# Patient Record
Sex: Female | Born: 1955
Health system: Southern US, Community
[De-identification: ages and names within clinical notes are randomized; demographics above are authoritative.]

## PROBLEM LIST (undated history)

## (undated) DIAGNOSIS — J45909 Unspecified asthma, uncomplicated: Secondary | ICD-10-CM

## (undated) DIAGNOSIS — K219 Gastro-esophageal reflux disease without esophagitis: Secondary | ICD-10-CM

## (undated) DIAGNOSIS — I951 Orthostatic hypotension: Secondary | ICD-10-CM

## (undated) DIAGNOSIS — R059 Cough, unspecified: Secondary | ICD-10-CM

## (undated) DIAGNOSIS — Z9882 Breast implant status: Secondary | ICD-10-CM

## (undated) DIAGNOSIS — J329 Chronic sinusitis, unspecified: Secondary | ICD-10-CM

## (undated) DIAGNOSIS — Z8659 Personal history of other mental and behavioral disorders: Secondary | ICD-10-CM

## (undated) DIAGNOSIS — R05 Cough: Secondary | ICD-10-CM

## (undated) DIAGNOSIS — G5600 Carpal tunnel syndrome, unspecified upper limb: Secondary | ICD-10-CM

## (undated) DIAGNOSIS — G8929 Other chronic pain: Secondary | ICD-10-CM

## (undated) DIAGNOSIS — J189 Pneumonia, unspecified organism: Secondary | ICD-10-CM

## (undated) DIAGNOSIS — C50911 Malignant neoplasm of unspecified site of right female breast: Secondary | ICD-10-CM

## (undated) DIAGNOSIS — C3490 Malignant neoplasm of unspecified part of unspecified bronchus or lung: Secondary | ICD-10-CM

## (undated) DIAGNOSIS — G4730 Sleep apnea, unspecified: Secondary | ICD-10-CM

## (undated) DIAGNOSIS — R0600 Dyspnea, unspecified: Secondary | ICD-10-CM

## (undated) DIAGNOSIS — T8859XA Other complications of anesthesia, initial encounter: Secondary | ICD-10-CM

## (undated) DIAGNOSIS — C50919 Malignant neoplasm of unspecified site of unspecified female breast: Secondary | ICD-10-CM

## (undated) DIAGNOSIS — I10 Essential (primary) hypertension: Secondary | ICD-10-CM

## (undated) DIAGNOSIS — M1990 Unspecified osteoarthritis, unspecified site: Secondary | ICD-10-CM

## (undated) DIAGNOSIS — F329 Major depressive disorder, single episode, unspecified: Secondary | ICD-10-CM

## (undated) DIAGNOSIS — M79672 Pain in left foot: Secondary | ICD-10-CM

## (undated) DIAGNOSIS — E785 Hyperlipidemia, unspecified: Secondary | ICD-10-CM

## (undated) DIAGNOSIS — R197 Diarrhea, unspecified: Secondary | ICD-10-CM

## (undated) DIAGNOSIS — Z720 Tobacco use: Secondary | ICD-10-CM

## (undated) DIAGNOSIS — F1910 Other psychoactive substance abuse, uncomplicated: Secondary | ICD-10-CM

## (undated) DIAGNOSIS — F32A Depression, unspecified: Secondary | ICD-10-CM

## (undated) DIAGNOSIS — D649 Anemia, unspecified: Secondary | ICD-10-CM

## (undated) DIAGNOSIS — H548 Legal blindness, as defined in USA: Secondary | ICD-10-CM

## (undated) DIAGNOSIS — Z862 Personal history of diseases of the blood and blood-forming organs and certain disorders involving the immune mechanism: Secondary | ICD-10-CM

## (undated) HISTORY — DX: Malignant neoplasm of unspecified site of unspecified female breast: C50.919

## (undated) HISTORY — PX: VAGINAL HYSTERECTOMY: SUR661

## (undated) HISTORY — DX: Breast implant status: Z98.82

## (undated) HISTORY — DX: Other chronic pain: G89.29

## (undated) HISTORY — DX: Tobacco use: Z72.0

## (undated) HISTORY — PX: BREAST LUMPECTOMY: SHX2

## (undated) HISTORY — DX: Chronic sinusitis, unspecified: J32.9

## (undated) HISTORY — DX: Pain in left foot: M79.672

## (undated) HISTORY — PX: PLANTAR FASCIA RELEASE: SHX2239

## (undated) HISTORY — DX: Essential (primary) hypertension: I10

## (undated) HISTORY — DX: Other psychoactive substance abuse, uncomplicated: F19.10

## (undated) HISTORY — DX: Malignant neoplasm of unspecified site of right female breast: C50.911

## (undated) HISTORY — DX: Sleep apnea, unspecified: G47.30

## (undated) HISTORY — DX: Diarrhea, unspecified: R19.7

## (undated) HISTORY — DX: Hyperlipidemia, unspecified: E78.5

## (undated) HISTORY — DX: Gastro-esophageal reflux disease without esophagitis: K21.9

## (undated) HISTORY — DX: Carpal tunnel syndrome, unspecified upper limb: G56.00

## (undated) HISTORY — PX: TONSILLECTOMY: SUR1361

## (undated) HISTORY — DX: Personal history of other mental and behavioral disorders: Z86.59

## (undated) HISTORY — DX: Personal history of diseases of the blood and blood-forming organs and certain disorders involving the immune mechanism: Z86.2

---
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
## 1988-01-24 HISTORY — PX: BREAST LUMPECTOMY: SHX2

## 1988-01-24 HISTORY — PX: VAGINAL HYSTERECTOMY: SUR661

## 1991-01-24 HISTORY — PX: PLACEMENT OF BREAST IMPLANTS: SHX6334

## 1998-02-04 ENCOUNTER — Other Ambulatory Visit: Admission: RE | Admit: 1998-02-04 | Discharge: 1998-02-04 | Payer: Self-pay | Admitting: Gynecology

## 2007-07-12 ENCOUNTER — Emergency Department (HOSPITAL_BASED_OUTPATIENT_CLINIC_OR_DEPARTMENT_OTHER): Admission: EM | Admit: 2007-07-12 | Discharge: 2007-07-12 | Payer: Self-pay | Admitting: Emergency Medicine

## 2007-07-12 ENCOUNTER — Ambulatory Visit (HOSPITAL_COMMUNITY): Admission: RE | Admit: 2007-07-12 | Discharge: 2007-07-12 | Payer: Self-pay | Admitting: Emergency Medicine

## 2008-04-29 DIAGNOSIS — E785 Hyperlipidemia, unspecified: Secondary | ICD-10-CM

## 2008-04-29 DIAGNOSIS — I10 Essential (primary) hypertension: Secondary | ICD-10-CM | POA: Insufficient documentation

## 2008-04-29 DIAGNOSIS — K219 Gastro-esophageal reflux disease without esophagitis: Secondary | ICD-10-CM | POA: Insufficient documentation

## 2010-10-20 LAB — POCT TOXICOLOGY PANEL
Barbiturates: NEGATIVE
Cocaine: NEGATIVE
Opiates: NEGATIVE

## 2010-10-20 LAB — POCT I-STAT, CHEM 8
Creatinine, Ser: 0.8
Glucose, Bld: 105 — ABNORMAL HIGH
Hemoglobin: 12.9
Potassium: 3.8
TCO2: 24

## 2010-10-20 LAB — DIFFERENTIAL
Basophils Relative: 3 — ABNORMAL HIGH
Eosinophils Absolute: 0.1
Neutrophils Relative %: 53

## 2010-10-20 LAB — PROTIME-INR
INR: 1
Prothrombin Time: 13.4

## 2010-10-20 LAB — URINALYSIS, ROUTINE W REFLEX MICROSCOPIC
Bilirubin Urine: NEGATIVE
Ketones, ur: NEGATIVE
Nitrite: NEGATIVE
Urobilinogen, UA: 0.2

## 2010-10-20 LAB — CBC
MCHC: 35.7
MCV: 98.9
Platelets: 200
WBC: 6

## 2012-01-24 HISTORY — PX: BREAST LUMPECTOMY: SHX2

## 2012-04-08 ENCOUNTER — Other Ambulatory Visit: Payer: Self-pay | Admitting: Neurology

## 2012-04-08 DIAGNOSIS — R209 Unspecified disturbances of skin sensation: Secondary | ICD-10-CM

## 2012-04-11 ENCOUNTER — Ambulatory Visit (INDEPENDENT_AMBULATORY_CARE_PROVIDER_SITE_OTHER): Payer: BC Managed Care – PPO

## 2012-04-11 ENCOUNTER — Other Ambulatory Visit: Payer: Self-pay

## 2012-04-11 DIAGNOSIS — R209 Unspecified disturbances of skin sensation: Secondary | ICD-10-CM

## 2012-04-12 NOTE — Procedures (Signed)
[facility]  NEUROIMAGING REPORT   STUDY DATE: 04/11/12 PATIENT NAME: Gabrielle [name] DOB: November 27, 1955 MRN: [id number]  ORDERING CLINICIAN: Levert Feinstein, MD  CLINICAL HISTORY: 57 year old female with numbness.  EXAM: MRI cervical spine (without)  TECHNIQUE: MRI of the cervical spine was obtained utilizing 3 mm sagittal slices from the posterior fossa down to the T3-4 level with T1, T2 and inversion recovery views. In addition 4 mm axial slices from C2-3 down to T1-2 level were included with T2 and gradient echo views. CONTRAST: no IMAGING SITE: [facility] [address]   FINDINGS:  On sagittal views the vertebral bodies have normal height and alignment.  Disc bulging and spondylosis from C3-4 down to C6-7. Degenerative endplate disease at C6-7. The spinal cord is normal in size and appearance. The posterior fossa, pituitary gland and paraspinal soft tissues are unremarkable.    On axial views: C2-3, C3-4, C4-5: no spinal stenosis or foraminal narrowing  C5-6: disc bulging with mild bilateral foraminal stenosis C6-7: disc bulging with mild right foraminal stenosis C7-T1: no spinal stenosis or foraminal narrowing   Limited views of the soft tissues of the head and neck are unremarkable.  IMPRESSION:  Abnormal MRI cervical spine (without) demonstrating: 1. At C5-6: disc bulging with mild bilateral foraminal stenosis 2. At C6-7: disc bulging with mild right foraminal stenosis   INTERPRETING PHYSICIAN:  Suanne Marker, MD Certified in Neurology, Neurophysiology and Neuroimaging  [facility] [address] [place], [place] [address] [phone]

## 2012-04-15 ENCOUNTER — Telehealth: Payer: Self-pay | Admitting: Neurology

## 2012-04-15 NOTE — Telephone Encounter (Signed)
I have called her,  Mild Abnormal MRI cervical spine (without) demonstrating:  1. At C5-6: disc bulging with mild bilateral foraminal stenosis  2. At C6-7: disc bulging with mild right foraminal stenosis  Mild degenerative disc disease, no change in treatment plan

## 2012-04-23 HISTORY — PX: BREAST LUMPECTOMY WITH SENTINEL LYMPH NODE BIOPSY: SHX5597

## 2012-05-14 ENCOUNTER — Encounter (INDEPENDENT_AMBULATORY_CARE_PROVIDER_SITE_OTHER): Payer: Self-pay | Admitting: Internal Medicine

## 2012-05-14 DIAGNOSIS — C50919 Malignant neoplasm of unspecified site of unspecified female breast: Secondary | ICD-10-CM

## 2012-06-05 ENCOUNTER — Encounter: Payer: BC Managed Care – PPO | Admitting: Internal Medicine

## 2012-06-11 ENCOUNTER — Encounter: Payer: BC Managed Care – PPO | Admitting: Internal Medicine

## 2012-07-22 ENCOUNTER — Encounter (HOSPITAL_COMMUNITY): Payer: BC Managed Care – PPO | Attending: Oncology | Admitting: Oncology

## 2012-07-22 ENCOUNTER — Encounter (HOSPITAL_COMMUNITY): Payer: Self-pay | Admitting: Oncology

## 2012-07-22 VITALS — BP 144/79 | HR 69 | Temp 97.8°F | Resp 16 | Ht 60.0 in | Wt 157.0 lb

## 2012-07-22 DIAGNOSIS — C50919 Malignant neoplasm of unspecified site of unspecified female breast: Secondary | ICD-10-CM | POA: Insufficient documentation

## 2012-07-22 DIAGNOSIS — C50911 Malignant neoplasm of unspecified site of right female breast: Secondary | ICD-10-CM

## 2012-07-22 NOTE — Progress Notes (Signed)
Gabrielle [name] presented for labwork. Labs per MD order drawn via Peripheral Line 23 gauge needle inserted in left lateral upper forearm  Good blood return present. Procedure without incident.  Needle removed intact. Patient tolerated procedure well.

## 2012-07-22 NOTE — Progress Notes (Signed)
#  1 stage I, grade 1, ER positive greater than 90%, PR positive greater than 90%, KI-67 marker minimally raised, no LVI, clear margins, 3 lymph nodes, sentinel, all negative. Her Oncotype DX score was very low at 6. She therefore was recommended by her oncologist in [place] to avoid chemotherapy and to take hormonal therapy after radiation therapy. She is in the middle of radiation therapy by Dr.Palermo.  She will be a candidate hopefully for any of the medications once we verify her menopausal status. She started having hot flashes 2 years ago, possibly 2-1/2 years ago, which are mild but present. She had a vaginal hysterectomy for atypia many years ago. Both ovaries remain.  She is status post bilateral breast implants in the past.  I think an FSH and LH level are in order to verify menopausal status in this young 57 year old woman.  There is no family history of breast cancer. She was not on hormones to my knowledge. She has several siblings none of whom have cancer.  She smokes a pack a day and has done so for 40 years and has had a CAT scan this year in [place] which did not reveal cancer. This needs to be repeated over the next 2 years.  Oncology review of systems otherwise is negative. She does have a history of possible bipolar disorder. She has a remote history of drug use in the past. She has other issues including a history of GERD, hyperlipidemia, Esotropia status post 3 surgeries as a youth but with some persistence. She does not see double. She has also had a plantar fasciectomy and tonsillectomy.  BP 144/79  Pulse 69  Temp(Src) 97.8 F (36.6 C) (Oral)  Resp 16  Ht 5' (1.524 m)  Wt 157 lb (71.215 kg)  BMI 30.66 kg/m2  She is in no acute distress. She is alert and oriented. The skin exam is negative except for the right breast which is erythematous. The right breast is slightly swollen and tender. The left breast is negative for masses. Right breast is negative drop is masses and her  scar is well-healed. She has scars on the left breast which are well healed from prior benign findings. She has no lymphadenopathy in any location including cervical, supraclavicular, infraclavicular, axillary or inguinal areas. She has no arm edema or leg edema. Pulses are 1+ and symmetrical. She is right-handed. Lungs show diminished breath sounds as well as wheezes and rhonchi bilaterally. Her heart exam shows a regular rhythm and rate without murmur rub or gallop. Facial symmetry is intact except for the right eye which has medial deviation minimally.  This lady has a grade 1, stage I cancer with with a very low OncotypeDX score. She should do very well with 5 years of adjuvant hormonal therapy. If she is able to take an aromatase inhibitor and if she tolerates it well she may be a candidate for 5 for the years with tamoxifen if she is so inclined. More data will be out as she is treated over the next 5 years. She has more concern in my opinion with her smoking history and with this cancer. I encouraged her to quit smoking no matter what it takes. She is not sure she can achieve this. She will need to followup CAT scans in 2015 and 2016.

## 2012-07-22 NOTE — Patient Instructions (Addendum)
[facility] Discharge Instructions  RECOMMENDATIONS MADE BY THE CONSULTANT AND ANY TEST RESULTS WILL BE SENT TO YOUR REFERRING PHYSICIAN.  EXAM FINDINGS BY THE PHYSICIAN TODAY AND SIGNS OR SYMPTOMS TO REPORT TO CLINIC OR PRIMARY PHYSICIAN: Exam and discussion by Dr. Mariel Sleet.  You are at low risk for recurrence of breast cancer. We will check a luteinizing hormone level and a follicle stimulating hormone level. You are at a higher risk of developing lung cancer because of your smoking history.  Need to have Low contrasted CT of chest yearly for 2 more years.  MEDICATIONS PRESCRIBED:  None - Will start tamoxifen after you complete radiation  INSTRUCTIONS GIVEN AND DISCUSSED: Report any new lumps, bone pain, shortness of breath or other symptoms.  SPECIAL INSTRUCTIONS/FOLLOW-UP: Follow-up in 1 month to see PA.  Thank you for choosing [facility] to provide your oncology and hematology care.  To afford each patient quality time with our providers, please arrive at least 15 minutes before your scheduled appointment time.  With your help, our goal is to use those 15 minutes to complete the necessary work-up to ensure our physicians have the information they need to help with your evaluation and healthcare recommendations.    Effective January 1st, 2014, we ask that you re-schedule your appointment with our physicians should you arrive 10 or more minutes late for your appointment.  We strive to give you quality time with our providers, and arriving late affects you and other patients whose appointments are after yours.    Again, thank you for choosing [facility].  Our hope is that these requests will decrease the amount of time that you wait before being seen by our physicians.       _____________________________________________________________  Should you have questions after your visit to [facility], please contact our office at [phone]  [phone] between the hours of 8:30 a.m. and 5:00 p.m.  Voicemails left after 4:30 p.m. will not be returned until the following business day.  For prescription refill requests, have your pharmacy contact our office with your prescription refill request.

## 2012-07-23 LAB — FOLLICLE STIMULATING HORMONE: FSH: 117 m[IU]/mL — ABNORMAL HIGH

## 2012-08-01 ENCOUNTER — Other Ambulatory Visit (HOSPITAL_COMMUNITY): Payer: Self-pay | Admitting: Oncology

## 2012-08-01 ENCOUNTER — Telehealth (HOSPITAL_COMMUNITY): Payer: Self-pay

## 2012-08-01 DIAGNOSIS — C50919 Malignant neoplasm of unspecified site of unspecified female breast: Secondary | ICD-10-CM

## 2012-08-01 DIAGNOSIS — Z720 Tobacco use: Secondary | ICD-10-CM

## 2012-08-01 NOTE — Telephone Encounter (Signed)
Discussed with patient need to have CT of chest repeated in 3 months.  Will have scan done here at [facility].  To discuss further on visit on 08/21/12.

## 2012-08-21 ENCOUNTER — Telehealth (HOSPITAL_COMMUNITY): Payer: Self-pay

## 2012-08-21 ENCOUNTER — Encounter (HOSPITAL_COMMUNITY): Payer: BC Managed Care – PPO | Attending: Oncology | Admitting: Oncology

## 2012-08-21 ENCOUNTER — Encounter (HOSPITAL_COMMUNITY): Payer: Self-pay | Admitting: Oncology

## 2012-08-21 VITALS — BP 154/82 | HR 75 | Temp 98.2°F | Resp 16 | Wt 157.3 lb

## 2012-08-21 DIAGNOSIS — M8580 Other specified disorders of bone density and structure, unspecified site: Secondary | ICD-10-CM

## 2012-08-21 DIAGNOSIS — Z87891 Personal history of nicotine dependence: Secondary | ICD-10-CM | POA: Insufficient documentation

## 2012-08-21 DIAGNOSIS — C50919 Malignant neoplasm of unspecified site of unspecified female breast: Secondary | ICD-10-CM

## 2012-08-21 DIAGNOSIS — F17200 Nicotine dependence, unspecified, uncomplicated: Secondary | ICD-10-CM

## 2012-08-21 DIAGNOSIS — Z978 Presence of other specified devices: Secondary | ICD-10-CM

## 2012-08-21 DIAGNOSIS — M949 Disorder of cartilage, unspecified: Secondary | ICD-10-CM

## 2012-08-21 DIAGNOSIS — Z720 Tobacco use: Secondary | ICD-10-CM

## 2012-08-21 DIAGNOSIS — Z9882 Breast implant status: Secondary | ICD-10-CM

## 2012-08-21 DIAGNOSIS — C50911 Malignant neoplasm of unspecified site of right female breast: Secondary | ICD-10-CM | POA: Insufficient documentation

## 2012-08-21 DIAGNOSIS — M899 Disorder of bone, unspecified: Secondary | ICD-10-CM

## 2012-08-21 HISTORY — DX: Malignant neoplasm of unspecified site of right female breast: C50.911

## 2012-08-21 HISTORY — DX: Breast implant status: Z98.82

## 2012-08-21 HISTORY — DX: Tobacco use: Z72.0

## 2012-08-21 NOTE — Progress Notes (Signed)
Gabrielle Conrad, MD [facility] [address] [place] [address]  Invasive ductal carcinoma of right breast  Tobacco abuse  S/P bilateral breast implants  CURRENT THERAPY: S/P completion of radiation, to start hormonal therapy depending on estradiol level.  INTERVAL HISTORY: Gabrielle [name] 57 y.o. female returns for  regular  visit for followup of stage I, grade 1, invasive ductal carcinoma, ER positive greater than 90%, PR positive greater than 90%, KI-67 marker minimally raised, no LVI, clear margins, 3 lymph nodes, sentinel, all negative. Her Oncotype DX score was very low at 6. She therefore was recommended by her oncologist in [place] to avoid chemotherapy and to take hormonal therapy after radiation therapy. She completed radiation on 08/15/2012.  I personally reviewed and went over laboratory results with the patient.  Labs were reviewed.  Her FSH shows that she is post-menopausal, but estradiol was not performed.  Therefore, estradiol lab was drawn today.  If this shows that she is post-menopausal, then I will provide her an Rx for an Aromatase Inhibitor.  She reports, "I want the least expensive one."  I used a piece of the exam room paper and really explained her diagnosis, her staging, treatment including radiation following excision of the tumor, the pathology, and future treatment options of her cancer with anti-endocrine therapy.  We discussed the risks, benefits, alternatives, and side effects of therapy.  She is agreeable to pursue therapy upon reporting of her estradiol level.   I personally reviewed and went over radiographic studies with the patient.  Her CT scan performed in June 2014 showed a small opacity which will require a short-term follow-up.  This is scheduled for September.  The patient reports that she has a long history of pneumonias and bronchitis'.  She will require 3 years worth of surveillance CT chest imaging due to her >30 pack year smoking  history.    She continues to smoke and we had a long discussion regarding this.  Smoking cessation education was provided.  She smoke 1 ppd.   She did have a bone density test performed at [facility] a few months ago that showed osteopenia.  She is on Calcium and Vitamin D as a result.  This will need to be repeated in 2 years.   Oncologically, she denies any complaints and ROS questioning is negative.    Past Medical History  Diagnosis Date  . Hyperlipidemia   . Carpal tunnel syndrome   . Chronic pain in left foot   . GERD (gastroesophageal reflux disease)   . History of anemia   . H/O eating disorder   . Bipolar depression   . Breast cancer 04/2012    right  . Invasive ductal carcinoma of right breast 08/21/2012  . Tobacco abuse 08/21/2012    > 40 pack years  . S/P bilateral breast implants 08/21/2012    Prior to cancer diagnosis    has HYPERLIPIDEMIA; HYPERTENSION; GERD; Invasive ductal carcinoma of right breast; Tobacco abuse; and S/P bilateral breast implants on her problem list.     has No Known Allergies.  Gabrielle [name] had no medications administered during this visit.  Past Surgical History  Procedure Laterality Date  . Breast lumpectomy Left 1990    [facility]  . Breast lumpectomy with sentinel lymph node biopsy Right 04/2012  . Abdominal hysterectomy      vaginal - still both ovaries  . Appendectomy      Denies any headaches, dizziness, double vision,  fevers, chills, night sweats, nausea, vomiting, diarrhea, constipation, chest pain, heart palpitations, shortness of breath, blood in stool, black tarry stool, urinary pain, urinary burning, urinary frequency, hematuria.   PHYSICAL EXAMINATION  ECOG PERFORMANCE STATUS: 0 - Asymptomatic  Filed Vitals:   08/21/12 1200  BP: 154/82  Pulse: 75  Temp: 98.2 F (36.8 C)  Resp: 16    GENERAL:alert, no distress, well nourished, well developed, comfortable, cooperative and smiling SKIN: skin color, texture,  turgor are normal, no rashes or significant lesions HEAD: Normocephalic, No masses, lesions, tenderness or abnormalities EYES: normal, PERRLA, EOMI, Conjunctiva are pink and non-injected EARS: External ears normal OROPHARYNX:mucous membranes are moist  NECK: supple, no adenopathy, thyroid normal size, non-tender, without nodularity, no stridor, non-tender, trachea midline LYMPH:  no palpable lymphadenopathy, no hepatosplenomegaly BREAST:bilateral implants, no palpable abnormalities otherwise.  Right breast is erythematous from radiation and mildly tender to palpation. LUNGS: clear to auscultation and percussion, decreased breath sounds HEART: regular rate & rhythm, no murmurs, no gallops, S1 normal and S2 normal ABDOMEN:abdomen soft, non-tender, normal bowel sounds, no masses or organomegaly and no hepatosplenomegaly BACK: Back symmetric, no curvature. EXTREMITIES:less then 2 second capillary refill, no joint deformities, effusion, or inflammation, no edema, no skin discoloration, no clubbing, no cyanosis  NEURO: alert & oriented x 3 with fluent speech, no focal motor/sensory deficits, gait normal   LABORATORY DATA: CBC    Component Value Date/Time   WBC 6.0 07/12/2007 1227   RBC 3.45* 07/12/2007 1227   HGB 12.9 07/12/2007 1313   HCT 38.0 07/12/2007 1313   PLT 200 07/12/2007 1227   MCV 98.9 07/12/2007 1227   MCHC 35.7 CORRECTED FOR LIPEMIA 07/12/2007 1227   RDW 12.6 07/12/2007 1227   LYMPHSABS 2.2 07/12/2007 1227   MONOABS 0.4 07/12/2007 1227   EOSABS 0.1 07/12/2007 1227   BASOSABS 0.2* 07/12/2007 1227      Chemistry      Component Value Date/Time   NA 128* 07/12/2007 1313   K 3.8 07/12/2007 1313   CL 99 07/12/2007 1313   BUN 9 07/12/2007 1313   CREATININE 0.8 07/12/2007 1313   No results found for this basename: CALCIUM, ALKPHOS, AST, ALT, BILITOT      Results for Gabrielle [name] (MRN [id number]) as of 08/21/2012 12:03  Ref. Range 07/22/2012 16:20  LH No range found 52.7  FSH No  range found 117.0 (H)      RADIOGRAPHIC STUDIES:  06/28/2012  Slight nodular opacity in inferior right upper lobe of lung.  Follow-up chest CT is recommended in 3 months.    PATHOLOGY: Invasive ductal carcinoma, 1.5 cm in maximal dimension, coming to 0.6 cm of closest margin (posterior) rescetion margin, 0/3 lymph nodes, ER >90%, PR >90%, Ki-67 26.0, Her2 1+    ASSESSMENT:  1. Stage I, grade 1, invasive ductal carcinoma, ER positive greater than 90%, PR positive greater than 90%, KI-67 marker minimally raised, no LVI, clear margins, 3 lymph nodes, sentinel, all negative. Her Oncotype DX score was very low at 6. She therefore was recommended by her oncologist in [place] to avoid chemotherapy and to take hormonal therapy after radiation therapy. 2. Hot flashes x 2 years prior to endocrine therapy 3. Vaginal hysterectomy, for benign reasons, both ovaries remain. 4. S/P B/L breast implants in past (prior to diagnosis of cancer) 5. Tobacco abuse with > 40 pack year smoking history.  First CT performed in June 2014, will need to be followed for 3 years. 6. Osteopenic, patient reports with bone  density being performed at [facility], on Calcium and Vitamin D. 7. Opacity in right lung, to be follow-up on on in September with repeat CT of chest.  Patient Active Problem List   Diagnosis Date Noted  . Invasive ductal carcinoma of right breast 08/21/2012  . Tobacco abuse 08/21/2012  . S/P bilateral breast implants 08/21/2012  . HYPERLIPIDEMIA 04/29/2008  . HYPERTENSION 04/29/2008  . GERD 04/29/2008      PLAN:  1. I personally reviewed and went over laboratory results with the patient. 2. I personally reviewed and went over pathology results with the patient. 3. I personally reviewed and went over radiographic studies with the patient. 4. Follow-up CT of chest is scheduled for 10/02/2012. 5. Labs today: Estradiol 6. If patient is post-menopausal, will prescribe AI.   7. She will start  anti-endocrine therapy in the very near future pending results of lab work today. 8. Continue Calcium and Vitamin D supplementation 9. Return in 6 weeks for follow-up to evaluate for tolerability.   THERAPY PLAN:  From a cancer stabndpoint, we will start her on an anti-endocrine therapy, likely an AI once we prove she is post-menopausal.  She was shown to be osteopenic on bone density a few months back and is on Calcium and Vitamin D.  This will need to be repeated in 2 years.  We will need to follow-up on her CT scan of chest as well since she has a >30 pack year smoking history.    All questions were answered. The patient knows to call the clinic with any problems, questions or concerns. We can certainly see the patient much sooner if necessary.  Patient and plan discussed with Dr. Erline Hau and he is in agreement with the aforementioned.    Elise Knobloch

## 2012-08-21 NOTE — Telephone Encounter (Signed)
Call from [place].  Wants to know about BRACA 1 testing for daughter.  Would like to discuss with PA.  Can be reached at [phone].

## 2012-08-21 NOTE — Patient Instructions (Addendum)
[facility] Discharge Instructions  RECOMMENDATIONS MADE BY THE CONSULTANT AND ANY TEST RESULTS WILL BE SENT TO YOUR REFERRING PHYSICIAN.  EXAM FINDINGS BY THE PHYSICIAN TODAY AND SIGNS OR SYMPTOMS TO REPORT TO CLINIC OR PRIMARY PHYSICIAN: Exam and discussion by Dellis Anes, PA-C.  Plans are to put you on a anti-hormonal agent once we get the results of the estradiol level back.  MEDICATIONS PRESCRIBED:  none  INSTRUCTIONS GIVEN AND DISCUSSED: Report any new lumps, bone pain, shortness of breath or other symptoms.  SPECIAL INSTRUCTIONS/FOLLOW-UP: Follow-up in 6 weeks.  Thank you for choosing [facility] to provide your oncology and hematology care.  To afford each patient quality time with our providers, please arrive at least 15 minutes before your scheduled appointment time.  With your help, our goal is to use those 15 minutes to complete the necessary work-up to ensure our physicians have the information they need to help with your evaluation and healthcare recommendations.    Effective January 1st, 2014, we ask that you re-schedule your appointment with our physicians should you arrive 10 or more minutes late for your appointment.  We strive to give you quality time with our providers, and arriving late affects you and other patients whose appointments are after yours.    Again, thank you for choosing [facility].  Our hope is that these requests will decrease the amount of time that you wait before being seen by our physicians.       _____________________________________________________________  Should you have questions after your visit to [facility], please contact our office at [phone] between the hours of 8:30 a.m. and 5:00 p.m.  Voicemails left after 4:30 p.m. will not be returned until the following business day.  For prescription refill requests, have your pharmacy contact our office with your prescription  refill request.

## 2012-08-21 NOTE — Progress Notes (Signed)
Gabrielle [name] presented for labwork. Labs per MD order drawn via Peripheral Line 23 gauge needle inserted in left forearm  Good blood return present. Procedure without incident.  Needle removed intact. Patient tolerated procedure well.

## 2012-08-22 ENCOUNTER — Other Ambulatory Visit (HOSPITAL_COMMUNITY): Payer: Self-pay | Admitting: Oncology

## 2012-08-22 DIAGNOSIS — C50911 Malignant neoplasm of unspecified site of right female breast: Secondary | ICD-10-CM

## 2012-08-22 MED ORDER — ANASTROZOLE 1 MG PO TABS: 1.0000 mg | ORAL_TABLET | Freq: Every day | ORAL | Status: DC

## 2012-08-26 ENCOUNTER — Other Ambulatory Visit (HOSPITAL_COMMUNITY): Payer: Self-pay | Admitting: *Deleted

## 2012-08-26 DIAGNOSIS — C50911 Malignant neoplasm of unspecified site of right female breast: Secondary | ICD-10-CM

## 2012-08-28 ENCOUNTER — Telehealth: Payer: Self-pay | Admitting: Genetic Counselor

## 2012-08-28 NOTE — Telephone Encounter (Signed)
S/W PT IN RE GENETIC APPT 10/13 @ 2 W/KAREN POWELL WELCOME PACKET MAILED.

## 2012-09-02 ENCOUNTER — Telehealth (HOSPITAL_COMMUNITY): Payer: Self-pay | Admitting: Oncology

## 2012-09-02 NOTE — Telephone Encounter (Signed)
PT HAS APPT WITH SPEC ON 10/13 @ 2PM

## 2012-10-02 ENCOUNTER — Other Ambulatory Visit (HOSPITAL_COMMUNITY): Payer: Self-pay | Admitting: Oncology

## 2012-10-02 ENCOUNTER — Ambulatory Visit (HOSPITAL_COMMUNITY)
Admission: RE | Admit: 2012-10-02 | Discharge: 2012-10-02 | Disposition: A | Discharge status: Home / Self Care | Payer: BC Managed Care – PPO | Source: Ambulatory Visit | Attending: Oncology | Admitting: Oncology

## 2012-10-02 DIAGNOSIS — C50919 Malignant neoplasm of unspecified site of unspecified female breast: Secondary | ICD-10-CM

## 2012-10-02 DIAGNOSIS — Z853 Personal history of malignant neoplasm of breast: Secondary | ICD-10-CM | POA: Insufficient documentation

## 2012-10-02 DIAGNOSIS — Z720 Tobacco use: Secondary | ICD-10-CM

## 2012-10-02 DIAGNOSIS — R911 Solitary pulmonary nodule: Secondary | ICD-10-CM | POA: Insufficient documentation

## 2012-10-02 DIAGNOSIS — C50911 Malignant neoplasm of unspecified site of right female breast: Secondary | ICD-10-CM

## 2012-10-03 ENCOUNTER — Encounter (HOSPITAL_COMMUNITY): Payer: BC Managed Care – PPO | Attending: Oncology | Admitting: Oncology

## 2012-10-03 ENCOUNTER — Encounter (HOSPITAL_COMMUNITY): Payer: Self-pay | Admitting: Oncology

## 2012-10-03 VITALS — BP 158/83 | HR 66 | Temp 98.6°F | Resp 16 | Wt 158.6 lb

## 2012-10-03 DIAGNOSIS — C50911 Malignant neoplasm of unspecified site of right female breast: Secondary | ICD-10-CM

## 2012-10-03 DIAGNOSIS — Z09 Encounter for follow-up examination after completed treatment for conditions other than malignant neoplasm: Secondary | ICD-10-CM | POA: Insufficient documentation

## 2012-10-03 DIAGNOSIS — C50919 Malignant neoplasm of unspecified site of unspecified female breast: Secondary | ICD-10-CM

## 2012-10-03 DIAGNOSIS — Z853 Personal history of malignant neoplasm of breast: Secondary | ICD-10-CM | POA: Insufficient documentation

## 2012-10-03 DIAGNOSIS — F17200 Nicotine dependence, unspecified, uncomplicated: Secondary | ICD-10-CM

## 2012-10-03 DIAGNOSIS — J984 Other disorders of lung: Secondary | ICD-10-CM

## 2012-10-03 LAB — CBC WITH DIFFERENTIAL/PLATELET
Eosinophils Absolute: 0.1 10*3/uL (ref 0.0–0.7)
Eosinophils Relative: 1 % (ref 0–5)
HCT: 35.2 % — ABNORMAL LOW (ref 36.0–46.0)
Hemoglobin: 13.1 g/dL (ref 12.0–15.0)
Lymphocytes Relative: 14 % (ref 12–46)
Lymphs Abs: 1.3 10*3/uL (ref 0.7–4.0)
MCH: 35.1 pg — ABNORMAL HIGH (ref 26.0–34.0)
MCV: 94.4 fL (ref 78.0–100.0)
Monocytes Absolute: 0.6 10*3/uL (ref 0.1–1.0)
Monocytes Relative: 6 % (ref 3–12)
Platelets: 346 10*3/uL (ref 150–400)
RBC: 3.73 MIL/uL — ABNORMAL LOW (ref 3.87–5.11)

## 2012-10-03 LAB — COMPREHENSIVE METABOLIC PANEL
BUN: 10 mg/dL (ref 6–23)
CO2: 25 mEq/L (ref 19–32)
Calcium: 9.9 mg/dL (ref 8.4–10.5)
GFR calc Af Amer: >90 mL/min (ref >90)
GFR calc non Af Amer: >90 mL/min (ref >90)
Glucose, Bld: 103 mg/dL — ABNORMAL HIGH (ref 70–99)
Total Protein: 7.1 g/dL (ref 6.0–8.3)

## 2012-10-03 NOTE — Progress Notes (Signed)
Ernestine Conrad, MD [facility] [address] [place] [address]  Invasive ductal carcinoma of right breast - Plan: Calcium Citrate-Vitamin D (CALCIUM CITRATE + D3 PO), gabapentin (NEURONTIN) 800 MG tablet, bismuth subsalicylate (PEPTO BISMOL) 262 MG chewable tablet, CBC with Differential, Comprehensive metabolic panel, CBC with Differential, Comprehensive metabolic panel  CURRENT THERAPY: Arimidex daily beginning on 08/23/2012  INTERVAL HISTORY: GRESIA ISIDORO 57 y.o. female returns for  regular  visit for followup of stage I, grade 1, invasive ductal carcinoma, ER positive greater than 90%, PR positive greater than 90%, KI-67 marker minimally raised, no LVI, clear margins, 3 lymph nodes, sentinel, all negative. Her Oncotype DX score was very low at 6. She therefore was recommended by her oncologist in [place] to avoid chemotherapy and to take hormonal therapy after radiation therapy. She completed radiation on 08/15/2012.   I personally reviewed and went over radiographic studies with the patient.  Ct scan on 10/02/2012 showed:  Nodular opacity in the subpleural inferior right upper lobe is  unchanged from 06/28/2012. Consider additional follow-up in 6  months to ensure continued stability. As a result, I have placed a subsequent order for follow-up CT scan in 8 months time to prove stability of this area.  However, she will require 3 years worth of CT screening due to her smoking history.   She is tolerating Arimidex well without any typical side effects of the medication.   She reports that she has had chronic diarrhea for 20+ years.  She reports 4-6 loose stools all completed by 10 AM.  She reports that she saw a GI specialist many years ago and was told she has IBS.  She had a colonoscopy since turning 57 yo by Dr. Gabriel Cirri. It was negative. She has tried Pepto-Bismol.  I recommended she allow me to refer her to GI, but she declined.  She asked for an Anti-diarrheal, which I  declined to provide her today in fear that this may mask a condition that requires work-up.    She reports that recently she felt poor and went to see her PCP.  Labs were performed and her sodium was noted to be 123.  Her PCP held Lisinopril as a possible culprit.  She reports that she is feeling better since that appointment.   She reports that her breast implants were placed underneath breast tissue.  Therefore, she has breast tissue and this will require yearly mammography for screening.   From an oncology standpoint, she will continue with Arimidex and she denies any complaints with a negative ROS.    Past Medical History  Diagnosis Date  . Hyperlipidemia   . Carpal tunnel syndrome   . Chronic pain in left foot   . GERD (gastroesophageal reflux disease)   . History of anemia   . H/O eating disorder   . Bipolar depression   . Breast cancer 04/2012    right  . Invasive ductal carcinoma of right breast 08/21/2012  . Tobacco abuse 08/21/2012    > 40 pack years  . S/P bilateral breast implants 08/21/2012    Prior to cancer diagnosis  . Diarrhea     has HYPERLIPIDEMIA; HYPERTENSION; GERD; Invasive ductal carcinoma of right breast; Tobacco abuse; and S/P bilateral breast implants on her problem list.     has no allergies on file.  Ms. Barna had no medications administered during this visit.  Past Surgical History  Procedure Laterality Date  . Breast lumpectomy Left 1990  [facility]  . Breast lumpectomy with sentinel lymph node biopsy Right 04/2012  . Abdominal hysterectomy      vaginal - still both ovaries  . Appendectomy      Denies any headaches, dizziness, double vision, fevers, chills, night sweats, nausea, vomiting, chest pain, heart palpitations, shortness of breath, blood in stool, black tarry stool, urinary pain, urinary burning, urinary frequency, hematuria.   PHYSICAL EXAMINATION  ECOG PERFORMANCE STATUS: 1 - Symptomatic but completely ambulatory  Filed  Vitals:   10/03/12 1134  BP: 158/83  Pulse: 66  Temp: 98.6 F (37 C)  Resp: 16    GENERAL:alert, no distress, well nourished, well developed, comfortable, cooperative and smiling SKIN: skin color, texture, turgor are normal, no rashes or significant lesions HEAD: Normocephalic, No masses, lesions, tenderness or abnormalities EYES: normal, PERRLA, EOMI, Conjunctiva are pink and non-injected EARS: External ears normal OROPHARYNX:mucous membranes are moist  NECK: supple, no adenopathy, thyroid normal size, non-tender, without nodularity, no stridor, non-tender, trachea midline LYMPH:  no palpable lymphadenopathy BREAST:not examined LUNGS: positive findings: wheezing  diffusely HEART: regular rate & rhythm, no murmurs, no gallops, S1 normal and S2 normal ABDOMEN:abdomen soft, non-tender and normal bowel sounds BACK: Back symmetric, no curvature. EXTREMITIES:less then 2 second capillary refill, no joint deformities, effusion, or inflammation, no edema, no skin discoloration, no clubbing, no cyanosis  NEURO: alert & oriented x 3 with fluent speech, no focal motor/sensory deficits, gait normal    LABORATORY DATA: CBC    Component Value Date/Time   WBC 6.0 07/12/2007 1227   RBC 3.45* 07/12/2007 1227   HGB 12.9 07/12/2007 1313   HCT 38.0 07/12/2007 1313   PLT 200 07/12/2007 1227   MCV 98.9 07/12/2007 1227   MCHC 35.7 CORRECTED FOR LIPEMIA 07/12/2007 1227   RDW 12.6 07/12/2007 1227   LYMPHSABS 2.2 07/12/2007 1227   MONOABS 0.4 07/12/2007 1227   EOSABS 0.1 07/12/2007 1227   BASOSABS 0.2* 07/12/2007 1227      Chemistry      Component Value Date/Time   NA 128* 07/12/2007 1313   K 3.8 07/12/2007 1313   CL 99 07/12/2007 1313   BUN 9 07/12/2007 1313   CREATININE 0.8 07/12/2007 1313   No results found for this basename: CALCIUM,  ALKPHOS,  AST,  ALT,  BILITOT       RADIOGRAPHIC STUDIES:  Ct Chest Wo Contrast Screening  10/02/2012   *RADIOLOGY REPORT*  Clinical Data: History of breast  cancer.  Followup right upper lobe nodular opacity.  CT CHEST SCREENING WITHOUT CONTRAST  Technique:  Multidetector CT imaging of the chest was performed following the standard low-dose protocol without IV contrast.  Comparison: 06/28/2012.  Findings: No pathologically enlarged mediastinal, hilar or left axillary lymph nodes.  A low attenuation collection in the right axilla measures 1.6 x 2.3 cm (previously 2.6 x 3.7 cm).  Coronary artery calcification.  Heart size normal.  No pericardial effusion.  Centrilobular emphysema.  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  8 x 9 mm nodular opacity in the subpleural inferior right upper lobe (image 31) is unchanged 06/28/2012. Probable tiny subpleural lymph node along the left major fissure, unchanged.  No pleural fluid.  Airway is unremarkable.  Incidental imaging of the upper abdomen shows no acute findings. No worrisome lytic or sclerotic lesions.  Old left rib fractures.  IMPRESSION:  1.  Nodular opacity in the subpleural inferior right upper lobe is unchanged from 06/28/2012. Consider additional follow-up in 6 months to ensure continued stability.  2.  Decrease in size of right axillary postoperative fluid collection. 3.  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   Original Report Authenticated By: Leanna Battles, M.D.     PATHOLOGY: Invasive ductal carcinoma, 1.5 cm in maximal dimension, coming to 0.6 cm of closest margin (posterior) rescetion margin, 0/3 lymph nodes, ER >90%, PR >90%, Ki-67 26.0, Her2 1+    ASSESSMENT:  1. Stage I, grade 1, invasive ductal carcinoma, ER positive greater than 90%, PR positive greater than 90%, KI-67 marker minimally raised, no LVI, clear margins, 3 lymph nodes, sentinel, all negative. Her Oncotype DX score was very low at 6. She therefore was recommended by her oncologist in [place] to avoid chemotherapy and to take hormonal therapy after radiation therapy.  2. Hot flashes x 2 years prior to  endocrine therapy  3. Vaginal hysterectomy, for benign reasons, both ovaries remain.  4. S/P B/L breast implants in past (prior to diagnosis of cancer).  With breast tissue remaining and therefore requiring annual mammogram  5. Tobacco abuse with > 40 pack year smoking history. First CT performed in June 2014, will need to be followed for 3 years.  6. Osteopenic, patient reports with bone density being performed at [facility], on Calcium and Vitamin D.  7. Opacity in right lung, to be follow-up on in in 8 months.  8. Diarrhea  Patient Active Problem List   Diagnosis Date Noted  . Invasive ductal carcinoma of right breast 08/21/2012  . Tobacco abuse 08/21/2012  . S/P bilateral breast implants 08/21/2012  . HYPERLIPIDEMIA 04/29/2008  . HYPERTENSION 04/29/2008  . GERD 04/29/2008      PLAN:  1. I personally reviewed and went over laboratory results with the patient. 2. Labs today: CBC diff, CMET 3. CT chest without contrast in 9 months for surveillance and development of stability of right lung opacity. 4. Continue with Arimidex daily x 5 years.  5. Labs in 4 months: CBC diff, CMET 6. Yearly screening mammogram 7. Patient declined referral to GI for diarrhea 8. Refused to provide Rx strength anti-diarrheal 9. Smoking cessation education provided.  10. Follow-up with PCP as directed 11. Return in 4 months for follow-up   THERAPY PLAN:  Will follow NCCN guidelines regarding surveillance. She will continue with Arimidex daily x 5 years.  We will see her every 4-6 months x 5 years.  She will require annual screening mammogram.  All questions were answered. The patient knows to call the clinic with any problems, questions or concerns. We can certainly see the patient much sooner if necessary.  Patient and plan discussed with Dr. Erline Hau and he is in agreement with the aforementioned.   Eunique Balik

## 2012-10-03 NOTE — Patient Instructions (Addendum)
[facility] Discharge Instructions  RECOMMENDATIONS MADE BY THE CONSULTANT AND ANY TEST RESULTS WILL BE SENT TO YOUR REFERRING PHYSICIAN.  EXAM FINDINGS BY THE PHYSICIAN TODAY AND SIGNS OR SYMPTOMS TO REPORT TO CLINIC OR PRIMARY PHYSICIAN: You saw Dellis Anes PA today.  Please follow up with your mammograms as needed.  Have lab work completed today.  You will need a Chest CT in 6 months, and return follow up visit in 4 months with labs.   Thank you for choosing [facility] to provide your oncology and hematology care.  To afford each patient quality time with our providers, please arrive at least 15 minutes before your scheduled appointment time.  With your help, our goal is to use those 15 minutes to complete the necessary work-up to ensure our physicians have the information they need to help with your evaluation and healthcare recommendations.    Effective January 1st, 2014, we ask that you re-schedule your appointment with our physicians should you arrive 10 or more minutes late for your appointment.  We strive to give you quality time with our providers, and arriving late affects you and other patients whose appointments are after yours.    Again, thank you for choosing [facility].  Our hope is that these requests will decrease the amount of time that you wait before being seen by our physicians.       _____________________________________________________________  Should you have questions after your visit to [facility], please contact our office at [phone] between the hours of 8:30 a.m. and 5:00 p.m.  Voicemails left after 4:30 p.m. will not be returned until the following business day.  For prescription refill requests, have your pharmacy contact our office with your prescription refill request.

## 2012-10-04 ENCOUNTER — Telehealth (HOSPITAL_COMMUNITY): Payer: Self-pay

## 2012-10-04 NOTE — Telephone Encounter (Signed)
error 

## 2012-11-04 ENCOUNTER — Encounter: Payer: Self-pay | Admitting: Genetic Counselor

## 2012-11-04 ENCOUNTER — Other Ambulatory Visit: Payer: BC Managed Care – PPO | Admitting: Lab

## 2012-11-04 ENCOUNTER — Ambulatory Visit (HOSPITAL_BASED_OUTPATIENT_CLINIC_OR_DEPARTMENT_OTHER): Payer: BC Managed Care – PPO | Admitting: Genetic Counselor

## 2012-11-04 DIAGNOSIS — IMO0002 Reserved for concepts with insufficient information to code with codable children: Secondary | ICD-10-CM

## 2012-11-04 DIAGNOSIS — C50911 Malignant neoplasm of unspecified site of right female breast: Secondary | ICD-10-CM

## 2012-11-04 DIAGNOSIS — Z807 Family history of other malignant neoplasms of lymphoid, hematopoietic and related tissues: Secondary | ICD-10-CM

## 2012-11-04 DIAGNOSIS — Z806 Family history of leukemia: Secondary | ICD-10-CM

## 2012-11-04 DIAGNOSIS — C50919 Malignant neoplasm of unspecified site of unspecified female breast: Secondary | ICD-10-CM

## 2012-11-04 NOTE — Progress Notes (Signed)
Rhodia Albright, PA-C requested a consultation for genetic counseling and risk assessment for Gabrielle [name], a 57 y.o. female, for discussion of her personal history of breast cancer and family history of leukemia and lymphoma.  She presents to clinic today to discuss the possibility of a genetic predisposition to cancer, and to further clarify her risks, as well as her family members' risks for cancer.   HISTORY OF PRESENT ILLNESS: In 2014, at the age of 46, Gabrielle [name] was diagnosed with invasive ductal carcinoma of the right breast. This was treated with lumpectomy, radiation and tamoxifen for 5 years.  Her tumor is ER+/PR+ but I am not able to find the Her2 status.  The patient had an abdominal hysterectomy at age 33 for repeated cervical dysplasia.  Her ovaries are intact.    Past Medical History  Diagnosis Date  . Hyperlipidemia   . Carpal tunnel syndrome   . Chronic pain in left foot   . GERD (gastroesophageal reflux disease)   . History of anemia   . H/O eating disorder   . Bipolar depression   . Breast cancer 04/2012    right  . Invasive ductal carcinoma of right breast 08/21/2012  . Tobacco abuse 08/21/2012    > 40 pack years  . S/P bilateral breast implants 08/21/2012    Prior to cancer diagnosis  . Diarrhea     Past Surgical History  Procedure Laterality Date  . Breast lumpectomy Left 1990    [facility]  . Breast lumpectomy with sentinel lymph node biopsy Right 04/2012  . Abdominal hysterectomy      vaginal - still both ovaries  . Appendectomy      History   Social History  . Marital Status: Legally Separated    Spouse Name: N/A    Number of Children: 1  . Years of Education: N/A   Occupational History  .     Social History Main Topics  . Smoking status: Current Some Day Smoker -- 1.00 packs/day for 40 years    Types: Cigarettes  . Smokeless tobacco: Never Used  . Alcohol Use: 12.0 oz/week    20 Cans of beer per week     Comment: drinks 3 or 4  beers 3 - 4 times weekly  . Drug Use: None     Comment: history of substance abuse - none now  . Sexual Activity: No   Other Topics Concern  . None   Social History Narrative  . None    REPRODUCTIVE HISTORY AND PERSONAL RISK ASSESSMENT FACTORS: Menarche was at age 32.   postmenopausal Uterus Intact: no Ovaries Intact: yes G1P1A0, first live birth at age 86  She has not previously undergone treatment for infertility.   Oral Contraceptive use: 8 years   She has not used HRT in the past.    FAMILY HISTORY:  We obtained a detailed, 4-generation family history.  Significant diagnoses are listed below: Family History  Problem Relation Age of Onset  . Hypertension Mother   . Hypertension Father   . Lymphoma Maternal Aunt 77  . Leukemia Maternal Grandmother     dx in her late 2s    Patient's maternal ancestors are of unknown descent, and paternal ancestors are of [place] descent. There is no reported Ashkenazi Jewish ancestry. There is no known consanguinity.  GENETIC COUNSELING ASSESSMENT: Gabrielle [name] is a 57 y.o. female with a personal history of breast cancer and family history of lymphoma and leukemia which somewhat  suggestive of a sporadic breast cancer. We, therefore, discussed and recommended the following at today's visit.   DISCUSSION: We reviewed the characteristics, features and inheritance patterns of hereditary cancer syndromes. We also discussed genetic testing, including the appropriate family members to test, the process of testing, insurance coverage and turn-around-time for results. Hereditary characteristics cancer syndromes include diagnosis at 80 or younger, multiple generations of cancer, triple negative breast cancer under the age of 42 or multiple individuals with similar or related cancers.  Gabrielle [name] is the only person in her family with breast cancer, and it was diagnosed over the age of 24.  We discussed that a rare cancer syndrome that  results from mutations in the ATM gene can be associated with breast cancer and hematologic cancer, her current personal and family history does not meet the medical criteria for BCBS to cover testing.    In order to estimate her chance of having a BRCA mutation, we used statistical models (Penn II and Myriad Risk Calculator) and laboratory data that take into account her personal medical history, family history and ancestry.  Because each model is different, there can be a lot of variability in the risks they give.  Therefore, these numbers must be considered a rough range and not a precise risk of having a BRCA mutation.  These models estimate that she has approximately a 2.2-6.0% chance of having a mutation. Based on this assessment of her family and personal history, genetic testing is not recommended.  PLAN: After considering the risks, benefits, and limitations, Gabrielle [name] declined genetic testing based on the fact that her insurance company would not cover testing. We encouraged Gabrielle [name] to remain in contact with cancer genetics annually so that we can continuously update the family history and inform her of any changes in cancer genetics and testing that may be of benefit for her family. Gabrielle [name]'s questions were answered to her satisfaction today. Our contact information was provided should additional questions or concerns arise.  The patient was seen for a total of 60 minutes, greater than 50% of which was spent face-to-face counseling.  This note will also be sent to the referring provider via the electronic medical record. The patient will be supplied with a summary of this genetic counseling discussion as well as educational information on the discussed hereditary cancer syndromes following the conclusion of their visit.   Patient was discussed with Dr. Drue Second.   _______________________________________________________________________ For Office Staff:   Number of people involved in session: 1 Was an Intern/ student involved with case: yes

## 2012-11-13 ENCOUNTER — Other Ambulatory Visit (HOSPITAL_COMMUNITY): Payer: Self-pay | Admitting: Oncology

## 2012-11-13 DIAGNOSIS — C50911 Malignant neoplasm of unspecified site of right female breast: Secondary | ICD-10-CM

## 2012-11-13 MED ORDER — ANASTROZOLE 1 MG PO TABS: 1.0000 mg | ORAL_TABLET | Freq: Every day | ORAL | Status: DC

## 2012-11-18 ENCOUNTER — Other Ambulatory Visit (HOSPITAL_COMMUNITY): Payer: Self-pay | Admitting: Oncology

## 2012-11-28 ENCOUNTER — Other Ambulatory Visit: Payer: Self-pay

## 2013-02-03 ENCOUNTER — Other Ambulatory Visit (HOSPITAL_COMMUNITY): Payer: BC Managed Care – PPO

## 2013-02-05 ENCOUNTER — Ambulatory Visit (HOSPITAL_COMMUNITY): Payer: BC Managed Care – PPO

## 2013-02-05 ENCOUNTER — Other Ambulatory Visit (HOSPITAL_COMMUNITY): Payer: BC Managed Care – PPO

## 2013-02-17 DIAGNOSIS — C50919 Malignant neoplasm of unspecified site of unspecified female breast: Secondary | ICD-10-CM | POA: Insufficient documentation

## 2013-04-07 ENCOUNTER — Other Ambulatory Visit (HOSPITAL_COMMUNITY): Payer: Self-pay | Admitting: Oncology

## 2013-07-02 ENCOUNTER — Other Ambulatory Visit (HOSPITAL_COMMUNITY): Payer: BC Managed Care – PPO

## 2013-08-20 DIAGNOSIS — I739 Peripheral vascular disease, unspecified: Secondary | ICD-10-CM | POA: Insufficient documentation

## 2013-08-20 DIAGNOSIS — E871 Hypo-osmolality and hyponatremia: Secondary | ICD-10-CM | POA: Insufficient documentation

## 2013-09-12 ENCOUNTER — Other Ambulatory Visit: Payer: Self-pay | Admitting: *Deleted

## 2013-09-12 DIAGNOSIS — M79606 Pain in leg, unspecified: Secondary | ICD-10-CM

## 2013-09-12 DIAGNOSIS — I70219 Atherosclerosis of native arteries of extremities with intermittent claudication, unspecified extremity: Secondary | ICD-10-CM

## 2013-10-13 ENCOUNTER — Encounter: Payer: Self-pay | Admitting: Vascular Surgery

## 2013-10-14 ENCOUNTER — Ambulatory Visit (INDEPENDENT_AMBULATORY_CARE_PROVIDER_SITE_OTHER): Payer: BC Managed Care – PPO | Admitting: Vascular Surgery

## 2013-10-14 ENCOUNTER — Ambulatory Visit (HOSPITAL_COMMUNITY)
Admission: RE | Admit: 2013-10-14 | Discharge: 2013-10-14 | Disposition: A | Discharge status: Home / Self Care | Payer: BC Managed Care – PPO | Source: Ambulatory Visit | Attending: Vascular Surgery | Admitting: Vascular Surgery

## 2013-10-14 ENCOUNTER — Encounter: Payer: Self-pay | Admitting: Vascular Surgery

## 2013-10-14 VITALS — BP 155/93 | HR 112 | Resp 16 | Ht 61.0 in | Wt 165.0 lb

## 2013-10-14 DIAGNOSIS — I10 Essential (primary) hypertension: Secondary | ICD-10-CM | POA: Insufficient documentation

## 2013-10-14 DIAGNOSIS — M79604 Pain in right leg: Secondary | ICD-10-CM | POA: Insufficient documentation

## 2013-10-14 DIAGNOSIS — E785 Hyperlipidemia, unspecified: Secondary | ICD-10-CM | POA: Insufficient documentation

## 2013-10-14 DIAGNOSIS — I739 Peripheral vascular disease, unspecified: Secondary | ICD-10-CM | POA: Insufficient documentation

## 2013-10-14 DIAGNOSIS — I70219 Atherosclerosis of native arteries of extremities with intermittent claudication, unspecified extremity: Secondary | ICD-10-CM | POA: Insufficient documentation

## 2013-10-14 DIAGNOSIS — M79606 Pain in leg, unspecified: Secondary | ICD-10-CM

## 2013-10-14 DIAGNOSIS — M79609 Pain in unspecified limb: Secondary | ICD-10-CM

## 2013-10-14 DIAGNOSIS — M79605 Pain in left leg: Secondary | ICD-10-CM | POA: Insufficient documentation

## 2013-10-14 NOTE — Progress Notes (Signed)
Subjective:     Patient ID: Gabrielle [name], female   DOB: 06-19-1955, 58 y.o.   MRN: [id number]  HPI this 58 year old female was referred by Dr. Everardo All for possible vascular claudication symptoms in the lower 63s. This patient who is a smoker and has a history of breast cancer has been having aching discomfort in both legs below the knees left worse than right which occurs with exertion particularly walking up hills. This improves with rest but does not necessarily completely resolve. She has no history of rest pain, nonhealing ulcers, infection, or gangrene. She also has discomfort in her upper extremities particularly while typing which is associated with numbness and tingling in the forearms left worse than right. She states that she has had a diagnosis of possible carpal tunnel in the past. She has also had a cervical spine evaluation which was -2 years ago. He has no history of lumbar disc disease.  Past Medical History  Diagnosis Date  . Hyperlipidemia   . Carpal tunnel syndrome   . Chronic pain in left foot   . GERD (gastroesophageal reflux disease)   . History of anemia   . H/O eating disorder   . Bipolar depression   . Breast cancer 04/2012    right  . Invasive ductal carcinoma of right breast 08/21/2012  . Tobacco abuse 08/21/2012    > 40 pack years  . S/P bilateral breast implants 08/21/2012    Prior to cancer diagnosis  . Diarrhea     History  Substance Use Topics  . Smoking status: Current Some Day Smoker -- 1.00 packs/day for 40 years    Types: Cigarettes  . Smokeless tobacco: Never Used  . Alcohol Use: 12.0 oz/week    20 Cans of beer per week     Comment: drinks 3 or 4 beers 3 - 4 times weekly    Family History  Problem Relation Age of Onset  . Hypertension Mother   . Hypertension Father   . Lymphoma Maternal Aunt 77  . Leukemia Maternal Grandmother     dx in her late 74s    Not on File  Current outpatient prescriptions:acetaminophen (TYLENOL) 500  MG tablet, Take 1,000 mg by mouth 2 (two) times daily. Takes 2 bid, Disp: , Rfl: ;  amLODipine (NORVASC) 10 MG tablet, Take 10 mg by mouth daily., Disp: , Rfl: ;  anastrozole (ARIMIDEX) 1 MG tablet, Take 1 tablet (1 mg total) by mouth daily., Disp: 30 tablet, Rfl: 3;  aspirin 81 MG tablet, Take 81 mg by mouth daily., Disp: , Rfl:  Calcium Citrate-Vitamin D (CALCIUM CITRATE + D3 PO), Take 1 capsule by mouth 2 (two) times daily. Calcium 630mg  & D3 500 IU, Disp: , Rfl: ;  gabapentin (NEURONTIN) 800 MG tablet, Take 800 mg by mouth 2 (two) times daily., Disp: , Rfl: ;  ibuprofen (ADVIL,MOTRIN) 800 MG tablet, Take 800 mg by mouth every 8 (eight) hours as needed for pain. Takes 1 - 2 daily, Disp: , Rfl:  omeprazole (PRILOSEC) 20 MG capsule, Take 20 mg by mouth daily., Disp: , Rfl: ;  pravastatin (PRAVACHOL) 40 MG tablet, Take 40 mg by mouth daily., Disp: , Rfl: ;  traZODone (DESYREL) 100 MG tablet, Take 100 mg by mouth at bedtime. Takes 2 at bedtime, Disp: , Rfl:  varenicline (CHANTIX PAK) 0.5 MG X 11 & 1 MG X 42 tablet, Take by mouth 2 (two) times daily. Take one 0.5 mg tablet by mouth once daily for 3  days, then increase to one 0.5 mg tablet twice daily for 4 days, then increase to one 1 mg tablet twice daily., Disp: , Rfl: ;  bismuth subsalicylate (PEPTO BISMOL) 262 MG chewable tablet, Chew 524 mg by mouth as needed for indigestion., Disp: , Rfl:  Fish Oil OIL, Take 2 capsules by mouth at bedtime. 1200 mg for total of 2400mg , Disp: , Rfl: ;  lisinopril (PRINIVIL,ZESTRIL) 20 MG tablet, Take 20 mg by mouth daily., Disp: , Rfl: ;  METOPROLOL TARTRATE PO, Take 100 mg by mouth 2 (two) times daily., Disp: , Rfl:   BP 155/93  Pulse 112  Resp 16  Ht 5\' 1"  (1.549 m)  Wt 165 lb (74.844 kg)  BMI 31.19 kg/m2  Body mass index is 31.19 kg/(m^2).           Review of Systems denies Chest pain but does have occasional palpitations, dyspnea on exertion, pain in legs with walking, productive cough, wheezing due to  COPD, weakness in arms and legs, dizziness. Also has history of breast cancer as noted. Other systems negative complete review of systems except long history of tobacco abuse     Objective:   Physical Exam BP 155/93  Pulse 112  Resp 16  Ht 5\' 1"  (1.549 m)  Wt 165 lb (74.844 kg)  BMI 31.19 kg/m2  Gen.-alert and oriented x3 in no apparent distress HEENT normal for age Lungs no rhonchi or wheezing Cardiovascular regular rhythm no murmurs carotid pulses 3+ palpable no bruits audible Abdomen soft nontender no palpable masses Musculoskeletal free of  major deformities Skin clear -no rashes Neurologic normal Lower extremities 3+ femoral and dorsalis pedis pulses palpable bilaterally with no edema Upper extremities have 3+ brachial and radial pulses palpable with pink well-perfused digits Today I  ordered lower severe arterial Doppler exam which I reviewed and interpreted. The ABIs are totally normal exceeding 1.0 bilaterally and the flow is triphasic in nature which is normal.       Assessment:   bilateral leg discomfort with ambulation and upper extremity discomfort with tingling-no evidence of arterial insufficiency in either upper or lower extremities Ongoing tobacco abuse     Plan:     No evidence of arterial insufficiency and upper lower remedies  I do not know the etiology of her symptoms although spinal stenosis could cause symptoms in the lower extremities which mimic vascular claudication She will return to see Dr.Bluth for continued followup and return to see me on when necessary basis note specific recommendations regarding further workup since vascular status appears normal

## 2014-01-23 DIAGNOSIS — C3490 Malignant neoplasm of unspecified part of unspecified bronchus or lung: Secondary | ICD-10-CM

## 2014-01-23 HISTORY — DX: Malignant neoplasm of unspecified part of unspecified bronchus or lung: C34.90

## 2014-02-25 DIAGNOSIS — F17210 Nicotine dependence, cigarettes, uncomplicated: Secondary | ICD-10-CM | POA: Insufficient documentation

## 2014-03-24 ENCOUNTER — Other Ambulatory Visit (HOSPITAL_COMMUNITY): Payer: Self-pay | Admitting: Oncology

## 2014-03-24 DIAGNOSIS — C50919 Malignant neoplasm of unspecified site of unspecified female breast: Secondary | ICD-10-CM

## 2014-04-02 ENCOUNTER — Ambulatory Visit (HOSPITAL_COMMUNITY): Payer: Self-pay

## 2014-04-03 ENCOUNTER — Encounter: Payer: Self-pay | Admitting: Cardiothoracic Surgery

## 2014-04-03 ENCOUNTER — Institutional Professional Consult (permissible substitution) (INDEPENDENT_AMBULATORY_CARE_PROVIDER_SITE_OTHER): Payer: 59 | Admitting: Cardiothoracic Surgery

## 2014-04-03 ENCOUNTER — Encounter (HOSPITAL_COMMUNITY)
Admission: RE | Admit: 2014-04-03 | Discharge: 2014-04-03 | Disposition: A | Discharge status: Home / Self Care | Payer: 59 | Source: Ambulatory Visit | Attending: Oncology | Admitting: Oncology

## 2014-04-03 VITALS — BP 143/92 | HR 114 | Resp 16 | Ht 61.0 in | Wt 165.0 lb

## 2014-04-03 DIAGNOSIS — Z853 Personal history of malignant neoplasm of breast: Secondary | ICD-10-CM

## 2014-04-03 DIAGNOSIS — D491 Neoplasm of unspecified behavior of respiratory system: Secondary | ICD-10-CM

## 2014-04-03 DIAGNOSIS — C50919 Malignant neoplasm of unspecified site of unspecified female breast: Secondary | ICD-10-CM | POA: Diagnosis present

## 2014-04-03 DIAGNOSIS — C3411 Malignant neoplasm of upper lobe, right bronchus or lung: Secondary | ICD-10-CM

## 2014-04-03 LAB — GLUCOSE, CAPILLARY: Glucose-Capillary: 94 mg/dL (ref 70–99)

## 2014-04-03 MED ORDER — FLUDEOXYGLUCOSE F - 18 (FDG) INJECTION
8.2100 | Freq: Once | INTRAVENOUS | Status: AC | PRN
  Administered 2014-04-03: 8.21 via INTRAVENOUS

## 2014-04-03 NOTE — Progress Notes (Signed)
[address]       [place] [address]             [phone]                    Daney G [facility] Medical Record #[id number] Date of Birth: 1955-12-14  Referring: Everardo All, MD Primary Care: Celedonio Savage, MD  Chief Complaint:    Chief Complaint  Patient presents with  . Lung Lesion    [facility]...CT CHEST 03/13/14 @ [facility], PET 04/03/14, PFT'S 03/24/14    History of Present Illness:    Gabrielle [name] 59 y.o. female is seen in the office for right upper lung nodule. Patient has a previous history of  stage I, grade 1 cancer the right breast ER positive greater than 90%, PR positive greater than 90%, HER-2/neu negative. She had surgery on May 03, 2012 followed by radiation therapy which was completed on 08/15/2012. We then initiated anastrozole 1 mg a day 08/23/2012. She was found to be postmenopausal to time of initiation of therapy. She had 3 negative sentinel nodes, no LVI, clear margins, and her Oncotype DX score was very low at 6. She will plan to take Arimidex for 5 years.  A CT scan of the chest done in 2014 10 mm right upper lobe lung nodule, subsequent CT shows this to have grown very slightly and become more solid in appearance. PET scan was recently performed and the patient is referred for consideration of resection.   On August 30, 2012 she did have a negative EGD and colonoscopy by Dr. Anthony Sar.  She always is mildly hyponatremic and that has not changed she states. She is still on a diuretic. She is also on anticholesterol medication. That includes a cholestyramine but she is also on the Pravachol.  Her bipolar disorder seems to be stable on therapy.  She is also status post bilateral breast implants many years ago   She continues smoking she tried Chantix.   Breast cancer  05/03/2012 Initial Diagnosis Right-sided lumpectomy and sentinel node biopsy confirms stage I grade 1 node-negative ER-positive. Positive HER-2 negative  breast cancer with a low Oncotype DX  - 08/15/2012 Radiation Radiation therapy completed to the right breast  08/23/2012 - Hormone Therapy Arimidex initiated after completing radiation therapy        Current Activity/ Functional Status:  Patient is independent with mobility/ambulation, transfers, ADL's, IADL's.   Zubrod Score: At the time of surgery this patient's most appropriate activity status/level should be described as: _0     0    Normal activity, no symptoms _1     1    Restricted in physical strenuous activity but ambulatory, able to do out light work _2     2    Ambulatory and capable of self care, unable to do work activities, up and about               >50 % of waking hours                              _3     3    Only limited self care, in bed greater than 50% of waking hours _4     4    Completely disabled, no self care, confined to bed or chair _5     5    Moribund   Past Medical History  Diagnosis Date  .  Hyperlipidemia   . Carpal tunnel syndrome   . Chronic pain in left foot   . GERD (gastroesophageal reflux disease)   . History of anemia   . H/O eating disorder   . Bipolar depression   . Breast cancer 04/2012    right  . Invasive ductal carcinoma of right breast 08/21/2012  . Tobacco abuse 08/21/2012    > 40 pack years  . S/P bilateral breast implants 08/21/2012    Prior to cancer diagnosis  . Diarrhea     Past Surgical History  Procedure Laterality Date  . Breast lumpectomy Left 1990    [facility]  . Breast lumpectomy with sentinel lymph node biopsy Right 04/2012  . Abdominal hysterectomy      vaginal - still both ovaries  . Appendectomy      Family History  Problem Relation Age of Onset  . Hypertension Mother   . Hypertension Father   . Lymphoma Maternal Aunt 77  . Leukemia Maternal Grandmother     dx in her late 88s    History   Social History  . Marital Status: Divorced    Spouse Name: N/A  . Number of Children: 1  . Years of Education: N/A    Occupational History  .     Social History Main Topics  . Smoking status: Current Some Day Smoker -- 1.00 packs/day for 40 years    Types: Cigarettes  . Smokeless tobacco: Never Used  . Alcohol Use: 12.0 oz/week    20 Cans of beer per week     Comment: drinks 3 or 4 beers 3 - 4 times weekly  . Drug Use: No     Comment: history of substance abuse - none now  . Sexual Activity: No   Other Topics Concern  . Not on file   Social History Narrative    History  Smoking status  . Current Some Day Smoker -- 1.00 packs/day for 40 years  . Types: Cigarettes  Smokeless tobacco  . Never Used    History  Alcohol Use  . 12.0 oz/week  . 20 Cans of beer per week    Comment: drinks 3 or 4 beers 3 - 4 times weekly     Allergies  Allergen Reactions  . Hydrocodone     Other reaction(s): Other Makes her hyper    Current Outpatient Prescriptions  Medication Sig Dispense Refill  . acetaminophen (TYLENOL) 500 MG tablet Take 1,000 mg by mouth 2 (two) times daily. Takes 2 bid    . anastrozole (ARIMIDEX) 1 MG tablet Take 1 tablet (1 mg total) by mouth daily. 30 tablet 3  . aspirin 81 MG tablet Take 81 mg by mouth daily.    Marland Kitchen bismuth subsalicylate (PEPTO BISMOL) 262 MG chewable tablet Chew 524 mg by mouth as needed for indigestion.    . Calcium Citrate-Vitamin D (CALCIUM CITRATE + D3 PO) Take 1 capsule by mouth 2 (two) times daily. Calcium 682m & D3 500 IU    . gabapentin (NEURONTIN) 800 MG tablet Take 800 mg by mouth 3 (three) times daily.     .Marland Kitchenibuprofen (ADVIL,MOTRIN) 800 MG tablet Take 800 mg by mouth daily. Takes 1 - 2 daily    . lisinopril (PRINIVIL,ZESTRIL) 20 MG tablet Take 20 mg by mouth daily.    . metoprolol (LOPRESSOR) 50 MG tablet Take 50 mg by mouth at bedtime.    . metoprolol succinate (TOPROL-XL) 50 MG 24 hr tablet Take 50 mg by mouth daily.  Take with or immediately following a meal.    . METOPROLOL TARTRATE PO Take 100 mg by mouth daily. IN THE AM    . omeprazole  (PRILOSEC) 20 MG capsule Take 20 mg by mouth daily.    . pravastatin (PRAVACHOL) 40 MG tablet Take 40 mg by mouth daily.    . traZODone (DESYREL) 100 MG tablet Take 100 mg by mouth at bedtime. Takes 2 at bedtime     No current facility-administered medications for this visit.      Review of Systems:     Cardiac Review of Systems: Y or N  Chest Pain [  n  ]  Resting SOB [   ] Exertional SOB  [n  ]  Orthopnea [ n ]   Pedal Edema [ n  ]    Palpitations [ y ] Syncope  [ n ]   Presyncope [  n ]  General Review of Systems: [Y] = yes [  ]=no Constitional: recent weight change [ n ];  Wt loss over the last 3 months [   ] anorexia [  ]; fatigue [  ]; nausea [  ]; night sweats [  ]; fever [  ]; or chills [  ];          Dental: poor dentition[ n ]; Last Dentist visit:   Eye : blurred vision [  ]; diplopia [   ]; vision changes [  ];  Amaurosis fugax[  ]; Resp: cough [  ];  wheezing[y  ];  hemoptysis[n  ]; shortness of breath[ n ]; paroxysmal nocturnal dyspnea[  ]; dyspnea on exertion[y  ]; or orthopnea[  ];  GI:  gallstones[  ], vomiting[  ];  dysphagia[  ]; melena[  ];  hematochezia [  ]; heartburn[  ];   Hx of  Colonoscopy[  ]; GU: kidney stones [  ]; hematuria[  ];   dysuria [  ];  nocturia[  ];  history of     obstruction [  ]; urinary frequency [  ]             Skin: rash, swelling[  ];, hair loss[  ];  peripheral edema[  ];  or itching[  ]; Musculosketetal: myalgias[  ];  joint swelling[  ];  joint erythema[  ];  joint pain[  ];  back pain[  ];  Heme/Lymph: bruising[  ];  bleeding[  ];  anemia[  ];  Neuro: TIA[  ];  headaches[  ];  stroke[  ];  vertigo[  ];  seizures[  ];   paresthesias[ n ];  difficulty walking[  n];  Psych:depression[  ]; anxiety[  ];  Endocrine: diabetes[ n ];  thyroid dysfunction[n ];  Immunizations: Flu up to date [ y ]; Pneumococcal up to date Florencio.Farrier  ];  Other:  Physical Exam: BP 143/92 mmHg  Pulse 114  Resp 16  Ht _0  (1.549 m)  Wt 165 lb (74.844 kg)  BMI 31.19  kg/m2  SpO2 96%  PHYSICAL EXAMINATION: General appearance: alert and cooperative Head: Normocephalic, without obvious abnormality, atraumatic Neck: no adenopathy, no carotid bruit, no JVD, supple, symmetrical, trachea midline and thyroid not enlarged, symmetric, no tenderness/mass/nodules Lymph nodes: Cervical, supraclavicular, and axillary nodes normal. Resp: clear to auscultation bilaterally Back: symmetric, no curvature. ROM normal. No CVA tenderness. Cardio: regular rate and rhythm, S1, S2 normal, no murmur, click, rub or gallop GI: soft, non-tender; bowel sounds normal; no masses,  no organomegaly Extremities: extremities  normal, atraumatic, no cyanosis or edema and Homans sign is negative, no sign of DVT Neurologic: Grossly normal No carotid bruits Palpable DP and PT pulses bilaterally Diagnostic Studies & Laboratory data:     Recent Radiology Findings:  CT scan Diagnostic report text  CLINICAL DATA: Lung nodule, followup ; history RIGHT breast cancer post lumpectomy, smoker, hypertension, cough productive of yellow material worse in morning  EXAM: CT CHEST WITHOUT CONTRAST  TECHNIQUE: Multidetector CT imaging of the chest was performed following the standard protocol without IV contrast. Sagittal and coronal MPR images reconstructed from axial data set.  COMPARISON: 10/02/2012, 03/04/2013  FINDINGS: BILATERAL breast prostheses with scattered capsular calcifications bilaterally.  Probable intracapsular rupture of the RIGHT breast prosthesis.  Small amounts of fluid are seen surrounding both prostheses greater on RIGHT.  Scattered atherosclerotic calcifications aorta and coronary arteries.  No thoracic adenopathy.  Visualized upper abdomen unremarkable.  Increase in size of an irregular anterior RIGHT upper lobe nodule 14 x 12 x 10 mm near minor fissure, appears more solid than on previous exam.  This nodule measured 10 x 9 x 8 mm on  10/02/2012.  No additional mass, nodule, infiltrate, or pleural effusion.  No acute osseous findings.  Old LEFT rib fractures noted.  IMPRESSION: Interval increase in size of an anterior RIGHT upper lobe nodule near minor fissure concerning for tumor.  No other intra thoracic abnormalities identified.   Electronically Signed By: Lavonia Dana M.D. On: 03/14/2014 09:25      Nm Pet Image Restag (ps) Skull Base To Thigh  04/03/2014   CLINICAL DATA:  Subsequent treatment strategy for right breast cancer.  EXAM: NUCLEAR MEDICINE PET SKULL BASE TO THIGH  TECHNIQUE: 8.21 mCi F-18 FDG was injected intravenously. Full-ring PET imaging was performed from the skull base to thigh after the radiotracer. CT data was obtained and used for attenuation correction and anatomic localization.  FASTING BLOOD GLUCOSE:  Value: 94 mg/dl  COMPARISON:  Chest CT 03/13/2014 and 03/04/2013.  FINDINGS: NECK  No hypermetabolic cervical lymph nodes are identified.There are no lesions of the pharyngeal mucosal space.  CHEST  There are no hypermetabolic mediastinal, hilar, axillary or internal mammary lymph nodes. The enlarging subpleural right upper lobe nodule is hypermetabolic. This lesion measures 1.4 x 1.3 cm on image 33 and has an SUV max of 3.5. This lesion abuts the minor fissure. No other hypermetabolic pulmonary nodules identified. Partially calcified bilateral breast implants are noted without abnormal metabolic activity. There is stable atherosclerosis of the aorta, great vessels and coronary arteries.  ABDOMEN/PELVIS  There is no hypermetabolic activity within the liver, adrenal glands, spleen or pancreas. There is no hypermetabolic nodal activity. There are probable bilateral renal cysts, devoid of metabolic activity. Previous partial hysterectomy. Both ovaries are visualized and appear normal. Mild aortoiliac atherosclerosis.  SKELETON  There is no hypermetabolic activity to suggest osseous metastatic  disease. Old rib fractures noted on the left.  IMPRESSION: 1. The enlarging right upper lobe pulmonary nodule is hypermetabolic consistent with malignancy. Morphologic features favor primary bronchogenic carcinoma (likely adenocarcinoma) over metastatic disease. Tissue sampling recommended. 2. No evidence of chest wall recurrence or nodal metastases. 3. No evidence of distant metastatic disease.   Electronically Signed   By: Richardean Sale M.D.   On: 04/03/2014 12:13     I have independently reviewed the above radiologic studies.  Recent Lab Findings: Lab Results  Component Value Date   WBC 9.5 10/03/2012   HGB 13.1 10/03/2012   HCT 35.2* 10/03/2012  PLT 346 10/03/2012   GLUCOSE 103* 10/03/2012   ALT 27 10/03/2012   AST 29 10/03/2012   NA 127* 10/03/2012   K 4.2 10/03/2012   CL 89* 10/03/2012   CREATININE 0.66 10/03/2012   BUN 10 10/03/2012   CO2 25 10/03/2012   INR 1.0 07/12/2007      Assessment / Plan:    Enlarging right upper lobe pulmonary nodule is hypermetabolic consistent with malignancy. Morphologic features favor primary bronchogenic carcinoma (likely adenocarcinoma) over metastatic disease. Likely Stage 1 Lung cancer.I discussed in detail with the patient the options of primary surgical resection versus attempted needle biopsy. I recommended to her proceeding with surgical resection offers the best treatment option. We discussed possible needle biopsy and radiation therapy if the tumor was malignant. The patient is willing to proceed with surgical resection but would like to wait until March 28. She needs full pulmonary function studies before proceeding, which will be arranged. rupture of right breast implant on ct scan    I  spent 40 minutes counseling the patient face to face and 50% or more the  time was spent in counseling and coordination of care. The total time spent in the appointment was 60 minutes.  Grace Isaac MD      [address] [place] [address] Office [phone]   [place] [phone]  04/06/2014 2:14 PM

## 2014-04-03 NOTE — Patient Instructions (Signed)
Pulmonary Nodule A pulmonary nodule is a small, round growth of tissue in the lung. Pulmonary nodules can range in size from less than 1/5 inch (4 mm) to a little bigger than an inch (25 mm). Most pulmonary nodules are detected when imaging tests of the lung are being performed for a different problem. Pulmonary nodules are usually not cancerous (benign). However, some pulmonary nodules are cancerous (malignant). Follow-up treatment or testing is based on the size of the pulmonary nodule and your risk of getting lung cancer.  CAUSES Benign pulmonary nodules can be caused by various things. Some of the causes include:   Bacterial, fungal, or viral infections. This is usually an old infection that is no longer active, but it can sometimes be a current, active infection.  A benign mass of tissue.  Inflammation from conditions such as rheumatoid arthritis.   Abnormal blood vessels in the lungs. Malignant pulmonary nodules can result from lung cancer or from cancers that spread to the lung from other places in the body. SIGNS AND SYMPTOMS Pulmonary nodules usually do not cause symptoms. DIAGNOSIS Most often, pulmonary nodules are found incidentally when an X-ray or CT scan is performed to look for some other problem in the lung area. To help determine whether a pulmonary nodule is benign or malignant, your health care provider will take a medical history and order a variety of tests. Tests done may include:   Blood tests.  A skin test called a tuberculin test. This test is used to determine if you have been exposed to the germ that causes tuberculosis.   Chest X-rays. If possible, a new X-ray may be compared with X-rays you have had in the past.   CT scan. This test shows smaller pulmonary nodules more clearly than an X-ray.   Positron emission tomography (PET) scan. In this test, a safe amount of a radioactive substance is injected into the bloodstream. Then, the scan takes a picture of  the pulmonary nodule. The radioactive substance is eliminated from your body in your urine.   Biopsy. A tiny piece of the pulmonary nodule is removed so it can be checked under a microscope. TREATMENT  Pulmonary nodules that are benign normally do not require any treatment because they usually do not cause symptoms or breathing problems. Your health care provider may want to monitor the pulmonary nodule through follow-up CT scans. The frequency of these CT scans will vary based on the size of the nodule and the risk factors for lung cancer. For example, CT scans will need to be done more frequently if the pulmonary nodule is larger and if you have a history of smoking and a family history of cancer. Further testing or biopsies may be done if any follow-up CT scan shows that the size of the pulmonary nodule has increased. HOME CARE INSTRUCTIONS  Only take over-the-counter or prescription medicines as directed by your health care provider.  Keep all follow-up appointments with your health care provider. SEEK MEDICAL CARE IF:  You have trouble breathing when you are active.   You feel sick or unusually tired.   You do not feel like eating.   You lose weight without trying to.   You develop chills or night sweats.  SEEK IMMEDIATE MEDICAL CARE IF:  You cannot catch your breath, or you begin wheezing.   You cannot stop coughing.   You cough up blood.   You become dizzy or feel like you are going to pass out.   You   have sudden chest pain.   You have a fever or persistent symptoms for more than 2-3 days.   You have a fever and your symptoms suddenly get worse. MAKE SURE YOU:  Understand these instructions.  Will watch your condition.  Will get help right away if you are not doing well or get worse. Document Released: 11/06/2008 Document Revised: 09/11/2012 Document Reviewed: 07/01/2012 [facility] Patient Information 2015 [place], [place]. This information is not intended  to replace advice given to you by your health care provider. Make sure you discuss any questions you have with your health care provider.   Video-Assisted Thoracic Surgery Care After  Refer to this sheet in the next few weeks. These instructions provide you with information on caring for yourself after your procedure. Your doctor may also give you more specific instructions. Your procedure has been planned according to current medical practices, but problems sometimes occur. Call your doctor if you have any problems or questions after your procedure. HOME CARE  Only take over-the-counter or prescription medicines as told by your doctor. Only take pain medicines (narcotics) as told by your doctor. Do not drive until your doctor says it is OK. Driving while taking pain medicines or soon after surgery can be dangerous. Avoid activities that use your chest muscles for at least 3-4 weeks. These include lifting heavy objects. Take deep breaths. This expands the lungs and protects against a lung infection (pneumonia). Do breathing exercises as told by your doctor. If you were given a device to help with breathing, use it as told by your doctor. You may resume a normal diet and activities when you feel you are able to or as told by your doctor. Do not take a bath until your doctor says it is OK. Use the shower instead. Keep the bandage (dressing) covering the area where the chest tube was put (incision site) dry for 48 hours. Remove the bandage after 48 hours unless there is new fluid on the bandage. Remove bandages as told by your doctor. Change bandages if necessary or as told by your doctor. Keep all follow-up doctor visits. It is important to see your doctor after surgery. You may need follow-up care and your condition may need to be watched. GET HELP RIGHT AWAY IF: You have a fever. You have chest pain. You have a rash. You have shortness of breath. You have trouble breathing. You feel weak,  lightheaded, dizzy, or faint. You feel a lot of pain near an area where a surgical cut was made. The pain at an area where a surgical cut was made is getting worse. You notice bleeding, fluid, or pus coming from where a surgical cut was made. You notice irritation, puffiness (swelling), or redness near the surgical cut. There is a bad smell coming from from a bandage or from an area where a surgical cut was made. It feels like your heart is fluttering or beating rapidly. Your pain medicine does not relieve your pain. MAKE SURE YOU:  Understand these instructions. Will watch your condition. Will get help right away if you are not doing well or get worse. Document Released: 05/06/2012 Document Reviewed: 05/06/2012 [facility] Patient Information 2015 [place]. This information is not intended to replace advice given to you by your health care provider. Make sure you discuss any questions you have with your health care provider.   Video-Assisted Thoracic Surgery  Video-assisted thoracic surgery (VATS) is a procedure that allows your caregiver to look inside your chest and do  some minor operations. VATS is commonly done to:  Study or diagnose problems in the chest.  Take a tissue sample (biopsy).   Put medications directly into the chest.   Remove collections of fluid, pus, or blood.  Remove tumors. VATS is done using thoracoscopy. Thoracoscopy is a procedure in which a thin, lighted tube (thoracoscope) is put through a small surgical cut (incision)inyour chest wall. The thoracoscope used for VATS has a video camera on it. It allows your caregiver to see the inside of your chest on a television screen. LET YOUR CAREGIVER KNOW ABOUT:   Any allergies you have.  All medications you are taking, including vitamins, herbs, eyedrops, and over-the-counter medications and creams.  Use of steroids (by mouth or creams).   Previous problems you or members of your family have had with  the use of anesthetics.  Any blood disorders you have had.  Any blood thinners, like warfarin, that you take.  Previous surgery.   Any history of heart problems.  Other health problems you RISKS AND COMPLICATIONS  Severe bleeding (hemorrhage).  Injury to nerves or other structures in the chest.   Lung infection.  The chest may need to be opened with a large incision (thoracotomy) if the procedure cannot be completed safely with the thoracoscope. BEFORE THE PROCEDURE   Tests such as blood tests, urine tests, chest X-rays, and electrocardiography may be done.  Follow your caregiver's instructions if you are taking dietary supplements or medications. Your caregiver may tell you to stop taking them or to reduce your dosage.  Do not take new dietary supplements or medications within 1 week of your procedure unless your caregiver approves them.  Do not eat or drink within 8 hours of your procedure or as directed by your caregiver.  Do not smoke for as long as possible before your procedure. If possible, stop smoking 3-6 weeks before the procedure. PROCEDURE   You will be given a medication that makes you sleep (general anesthetic) through a mask, through an intravenous (IV) access tube, or through both. This medication will prevent you from being alert and feeling pain during the procedure.Once you are asleep, abreathing tube or mask may be used to help you breathe.  A video thoracoscope will be placed in a very small incision (less than an inch wide), so that your caregiver can see into your chest. The picture from inside the chest will be displayed on a television screen.  Other surgical tools will be inserted through the remaining incisions.  One of your lungs will be deflated as the surgery is performed. Deflating a lung makes it easier for your caregiver to see the area. The lung will be inflated at the end of the procedure.  The tools will be removed when your caregiver  has finished performing the examination or procedure.  A chest tube will be inserted through an incision to drain the wound. The remaining incisions will be closed with stitches or staples. AFTER THE PROCEDURE  Chest tubes are watched closely for signs of fluid or air buildup in the lungs. Your caregiver will most likely remove them prior to discharge.  You will need to stay in the hospital for 1-5 days. The average time in the hospital with VATS surgery is usually several days less than with standard open surgery.  You may receive medications to help with pain.  It is normal to feel sore for about 2 weeks after the procedure. Document Released: 05/06/2012 Document Reviewed: 05/06/2012 [employer] Patient  Information 2015 [place], [place]. This information is not intended to replace advice given to you by your health care provider. Make sure you discuss any questions you have with your health care provider.

## 2014-04-06 ENCOUNTER — Other Ambulatory Visit: Payer: Self-pay | Admitting: *Deleted

## 2014-04-06 DIAGNOSIS — R911 Solitary pulmonary nodule: Secondary | ICD-10-CM

## 2014-04-06 DIAGNOSIS — E7800 Pure hypercholesterolemia, unspecified: Secondary | ICD-10-CM | POA: Insufficient documentation

## 2014-04-07 LAB — PULMONARY FUNCTION TEST

## 2014-04-08 ENCOUNTER — Other Ambulatory Visit: Payer: Self-pay | Admitting: *Deleted

## 2014-04-16 ENCOUNTER — Other Ambulatory Visit (HOSPITAL_COMMUNITY): Payer: 59

## 2014-04-17 ENCOUNTER — Encounter (HOSPITAL_COMMUNITY): Payer: Self-pay

## 2014-04-17 ENCOUNTER — Ambulatory Visit (HOSPITAL_COMMUNITY)
Admission: RE | Admit: 2014-04-17 | Discharge: 2014-04-17 | Disposition: A | Discharge status: Home / Self Care | Payer: 59 | Source: Ambulatory Visit | Attending: Cardiothoracic Surgery | Admitting: Cardiothoracic Surgery

## 2014-04-17 ENCOUNTER — Encounter (HOSPITAL_COMMUNITY)
Admission: RE | Admit: 2014-04-17 | Discharge: 2014-04-17 | Disposition: A | Discharge status: Home / Self Care | Payer: 59 | Source: Ambulatory Visit | Attending: Cardiothoracic Surgery | Admitting: Cardiothoracic Surgery

## 2014-04-17 VITALS — BP 132/54 | HR 74 | Temp 97.9°F | Resp 18 | Ht 61.0 in | Wt 163.1 lb

## 2014-04-17 DIAGNOSIS — R911 Solitary pulmonary nodule: Secondary | ICD-10-CM

## 2014-04-17 HISTORY — DX: Cough: R05

## 2014-04-17 HISTORY — DX: Pneumonia, unspecified organism: J18.9

## 2014-04-17 HISTORY — DX: Cough, unspecified: R05.9

## 2014-04-17 HISTORY — DX: Other complications of anesthesia, initial encounter: T88.59XA

## 2014-04-17 LAB — COMPREHENSIVE METABOLIC PANEL
ALT: 40 U/L — ABNORMAL HIGH (ref 0–35)
AST: 53 U/L — ABNORMAL HIGH (ref 0–37)
Albumin: 4 g/dL (ref 3.5–5.2)
Alkaline Phosphatase: 85 U/L (ref 39–117)
Anion gap: 11 (ref 5–15)
BUN: 14 mg/dL (ref 6–23)
CO2: 28 mmol/L (ref 19–32)
Calcium: 9.9 mg/dL (ref 8.4–10.5)
Chloride: 95 mmol/L — ABNORMAL LOW (ref 96–112)
Creatinine, Ser: 0.84 mg/dL (ref 0.50–1.10)
GFR calc Af Amer: 88 mL/min — ABNORMAL LOW (ref >90)
GFR calc non Af Amer: 76 mL/min — ABNORMAL LOW (ref >90)
Glucose, Bld: 110 mg/dL — ABNORMAL HIGH (ref 70–99)
Potassium: 4.7 mmol/L (ref 3.5–5.1)
Sodium: 134 mmol/L — ABNORMAL LOW (ref 135–145)
Total Bilirubin: 1.4 mg/dL — ABNORMAL HIGH (ref 0.3–1.2)
Total Protein: 6.3 g/dL (ref 6.0–8.3)

## 2014-04-17 LAB — CBC
HCT: 38.7 % (ref 36.0–46.0)
Hemoglobin: 13.4 g/dL (ref 12.0–15.0)
MCH: 33.7 pg (ref 26.0–34.0)
MCHC: 34.6 g/dL (ref 30.0–36.0)
MCV: 97.2 fL (ref 78.0–100.0)
Platelets: 232 10*3/uL (ref 150–400)
RBC: 3.98 MIL/uL (ref 3.87–5.11)
RDW: 13.8 % (ref 11.5–15.5)
WBC: 7.5 10*3/uL (ref 4.0–10.5)

## 2014-04-17 LAB — URINALYSIS, ROUTINE W REFLEX MICROSCOPIC
Glucose, UA: NEGATIVE mg/dL
Hgb urine dipstick: NEGATIVE
Ketones, ur: 15 mg/dL — AB
Leukocytes, UA: NEGATIVE
Nitrite: NEGATIVE
Protein, ur: NEGATIVE mg/dL
Specific Gravity, Urine: 1.024 (ref 1.005–1.030)
Urobilinogen, UA: 0.2 mg/dL (ref 0.0–1.0)
pH: 5.5 (ref 5.0–8.0)

## 2014-04-17 LAB — APTT: aPTT: 30 seconds (ref 24–37)

## 2014-04-17 LAB — TYPE AND SCREEN
ABO/RH(D): O POS
Antibody Screen: NEGATIVE

## 2014-04-17 LAB — SURGICAL PCR SCREEN
MRSA, PCR: NEGATIVE
Staphylococcus aureus: NEGATIVE

## 2014-04-17 LAB — PROTIME-INR
INR: 0.94 (ref 0.00–1.49)
Prothrombin Time: 12.6 seconds (ref 11.6–15.2)

## 2014-04-17 LAB — ABO/RH: ABO/RH(D): O POS

## 2014-04-17 NOTE — Pre-Procedure Instructions (Signed)
SHEANA BIR  04/17/2014   Your procedure is scheduled on:  04/20/14  Report to [facility] Admitting at 530 AM.  Call this number if you have problems the morning of surgery: [phone]   Remember:   Do not eat food or drink liquids after midnight.   Take these medicines the morning of surgery with A SIP OF WATER: neurontin,metoprolol,prilosec   Do not wear jewelry, make-up or nail polish.  Do not wear lotions, powders, or perfumes. You may wear deodorant.  Do not shave 48 hours prior to surgery. Men may shave face and neck.  Do not bring valuables to the hospital.  [facility] is not responsible                  for any belongings or valuables.               Contacts, dentures or bridgework may not be worn into surgery.  Leave suitcase in the car. After surgery it may be brought to your room.  For patients admitted to the hospital, discharge time is determined by your                treatment team.               Patients discharged the day of surgery will not be allowed to drive  home.  Name and phone number of your driver: family  Special Instructions: Shower using CHG 2 nights before surgery and the night before surgery.  If you shower the day of surgery use CHG.  Use special wash - you have one bottle of CHG for all showers.  You should use approximately 1/3 of the bottle for each shower.   Please read over the following fact sheets that you were given: Pain Booklet, Coughing and Deep Breathing, Blood Transfusion Information, MRSA Information and Surgical Site Infection Prevention

## 2014-04-17 NOTE — Pre-Procedure Instructions (Signed)
Gabrielle [name]  04/17/2014   Your procedure is scheduled on:  04/20/14  Report to [name] Admitting at 530 AM.  Call this number if you have problems the morning of surgery: [phone]   Remember:   Do not eat food or drink liquids after midnight.   Take these medicines the morning of surgery with A SIP OF WATER: buspar,neurontin,metoprolol,prilosec   Do not wear jewelry, make-up or nail polish.  Do not wear lotions, powders, or perfumes. You may wear deodorant.  Do not shave 48 hours prior to surgery. Men may shave face and neck.  Do not bring valuables to the hospital.  [facility] is not responsible                  for any belongings or valuables.               Contacts, dentures or bridgework may not be worn into surgery.  Leave suitcase in the car. After surgery it may be brought to your room.  For patients admitted to the hospital, discharge time is determined by your                treatment team.               Patients discharged the day of surgery will not be allowed to drive  home.  Name and phone number of your driver: family  Special Instructions: Shower using CHG 2 nights before surgery and the night before surgery.  If you shower the day of surgery use CHG.  Use special wash - you have one bottle of CHG for all showers.  You should use approximately 1/3 of the bottle for each shower.   Please read over the following fact sheets that you were given: Pain Booklet, Coughing and Deep Breathing, Blood Transfusion Information, MRSA Information and Surgical Site Infection Prevention

## 2014-04-19 MED ORDER — DEXTROSE 5 % IV SOLN
1.5000 g | INTRAVENOUS | Status: AC
  Administered 2014-04-20 (×2): 1.5 g via INTRAVENOUS
  Filled 2014-04-19: qty 1.5

## 2014-04-19 NOTE — Anesthesia Preprocedure Evaluation (Addendum)
Anesthesia Evaluation  Patient identified by MRN, date of birth, ID band Patient awake    Reviewed: Allergy & Precautions, NPO status , Patient's Chart, lab work & pertinent test results  Airway Mallampati: II  TM Distance: >3 FB Neck ROM: Full    Dental no notable dental hx. (+) Teeth Intact, Dental Advisory Given   Pulmonary neg pulmonary ROS, pneumonia -, resolved, Current Smoker,  breath sounds clear to auscultation  Pulmonary exam normal       Cardiovascular hypertension, Pt. on home beta blockers Rhythm:Regular Rate:Normal     Neuro/Psych PSYCHIATRIC DISORDERS Depression negative neurological ROS  negative psych ROS   GI/Hepatic Neg liver ROS, GERD-  Medicated,  Endo/Other  negative endocrine ROS  Renal/GU negative Renal ROS  negative genitourinary   Musculoskeletal negative musculoskeletal ROS (+)   Abdominal (+) + obese,   Peds negative pediatric ROS (+)  Hematology negative hematology ROS (+)   Anesthesia Other Findings   Reproductive/Obstetrics negative OB ROS                           Anesthesia Physical Anesthesia Plan  ASA: III  Anesthesia Plan: General   Post-op Pain Management:    Induction: Intravenous  Airway Management Planned: Oral ETT and Double Lumen EBT  Additional Equipment: Arterial line and CVP  Intra-op Plan:   Post-operative Plan: Extubation in OR and Possible Post-op intubation/ventilation  Informed Consent: I have reviewed the patients History and Physical, chart, labs and discussed the procedure including the risks, benefits and alternatives for the proposed anesthesia with the patient or authorized representative who has indicated his/her understanding and acceptance.   Dental advisory given  Plan Discussed with: CRNA  Anesthesia Plan Comments:         Anesthesia Quick Evaluation

## 2014-04-20 ENCOUNTER — Encounter (HOSPITAL_COMMUNITY): Payer: Self-pay | Admitting: Surgery

## 2014-04-20 ENCOUNTER — Encounter (HOSPITAL_COMMUNITY)
Admission: RE | Disposition: A | Discharge status: Home Health | Payer: Self-pay | Source: Ambulatory Visit | Attending: Cardiothoracic Surgery

## 2014-04-20 ENCOUNTER — Inpatient Hospital Stay (HOSPITAL_COMMUNITY): Payer: 59

## 2014-04-20 ENCOUNTER — Inpatient Hospital Stay (HOSPITAL_COMMUNITY)
Admission: RE | Admit: 2014-04-20 | Discharge: 2014-05-02 | DRG: 163 | Disposition: A | Discharge status: Home Health | Payer: 59 | Source: Ambulatory Visit | Attending: Cardiothoracic Surgery | Admitting: Cardiothoracic Surgery

## 2014-04-20 ENCOUNTER — Inpatient Hospital Stay (HOSPITAL_COMMUNITY): Payer: 59 | Admitting: Anesthesiology

## 2014-04-20 DIAGNOSIS — C3410 Malignant neoplasm of upper lobe, unspecified bronchus or lung: Secondary | ICD-10-CM | POA: Diagnosis present

## 2014-04-20 DIAGNOSIS — R948 Abnormal results of function studies of other organs and systems: Secondary | ICD-10-CM | POA: Diagnosis present

## 2014-04-20 DIAGNOSIS — Z79899 Other long term (current) drug therapy: Secondary | ICD-10-CM

## 2014-04-20 DIAGNOSIS — T8549XA Other mechanical complication of breast prosthesis and implant, initial encounter: Secondary | ICD-10-CM | POA: Diagnosis present

## 2014-04-20 DIAGNOSIS — Z791 Long term (current) use of non-steroidal anti-inflammatories (NSAID): Secondary | ICD-10-CM

## 2014-04-20 DIAGNOSIS — I340 Nonrheumatic mitral (valve) insufficiency: Secondary | ICD-10-CM | POA: Diagnosis not present

## 2014-04-20 DIAGNOSIS — Z885 Allergy status to narcotic agent status: Secondary | ICD-10-CM

## 2014-04-20 DIAGNOSIS — E871 Hypo-osmolality and hyponatremia: Secondary | ICD-10-CM | POA: Diagnosis present

## 2014-04-20 DIAGNOSIS — D62 Acute posthemorrhagic anemia: Secondary | ICD-10-CM | POA: Diagnosis not present

## 2014-04-20 DIAGNOSIS — R51 Headache: Secondary | ICD-10-CM | POA: Diagnosis not present

## 2014-04-20 DIAGNOSIS — Z90710 Acquired absence of both cervix and uterus: Secondary | ICD-10-CM

## 2014-04-20 DIAGNOSIS — J939 Pneumothorax, unspecified: Secondary | ICD-10-CM | POA: Diagnosis present

## 2014-04-20 DIAGNOSIS — Z4682 Encounter for fitting and adjustment of non-vascular catheter: Secondary | ICD-10-CM

## 2014-04-20 DIAGNOSIS — J810 Acute pulmonary edema: Secondary | ICD-10-CM

## 2014-04-20 DIAGNOSIS — J811 Chronic pulmonary edema: Secondary | ICD-10-CM

## 2014-04-20 DIAGNOSIS — Z888 Allergy status to other drugs, medicaments and biological substances status: Secondary | ICD-10-CM

## 2014-04-20 DIAGNOSIS — J9382 Other air leak: Secondary | ICD-10-CM | POA: Diagnosis present

## 2014-04-20 DIAGNOSIS — J690 Pneumonitis due to inhalation of food and vomit: Secondary | ICD-10-CM | POA: Diagnosis present

## 2014-04-20 DIAGNOSIS — F419 Anxiety disorder, unspecified: Secondary | ICD-10-CM | POA: Diagnosis present

## 2014-04-20 DIAGNOSIS — Z902 Acquired absence of lung [part of]: Secondary | ICD-10-CM

## 2014-04-20 DIAGNOSIS — Z170 Estrogen receptor positive status [ER+]: Secondary | ICD-10-CM

## 2014-04-20 DIAGNOSIS — Z853 Personal history of malignant neoplasm of breast: Secondary | ICD-10-CM | POA: Diagnosis not present

## 2014-04-20 DIAGNOSIS — Z7982 Long term (current) use of aspirin: Secondary | ICD-10-CM | POA: Diagnosis not present

## 2014-04-20 DIAGNOSIS — K219 Gastro-esophageal reflux disease without esophagitis: Secondary | ICD-10-CM | POA: Diagnosis present

## 2014-04-20 DIAGNOSIS — R911 Solitary pulmonary nodule: Secondary | ICD-10-CM | POA: Diagnosis present

## 2014-04-20 DIAGNOSIS — J9383 Other pneumothorax: Secondary | ICD-10-CM | POA: Diagnosis not present

## 2014-04-20 DIAGNOSIS — Z720 Tobacco use: Secondary | ICD-10-CM | POA: Diagnosis present

## 2014-04-20 DIAGNOSIS — R918 Other nonspecific abnormal finding of lung field: Secondary | ICD-10-CM

## 2014-04-20 DIAGNOSIS — C3411 Malignant neoplasm of upper lobe, right bronchus or lung: Principal | ICD-10-CM | POA: Diagnosis present

## 2014-04-20 DIAGNOSIS — J9621 Acute and chronic respiratory failure with hypoxia: Secondary | ICD-10-CM | POA: Diagnosis not present

## 2014-04-20 DIAGNOSIS — F17210 Nicotine dependence, cigarettes, uncomplicated: Secondary | ICD-10-CM | POA: Diagnosis present

## 2014-04-20 DIAGNOSIS — Z9689 Presence of other specified functional implants: Secondary | ICD-10-CM

## 2014-04-20 DIAGNOSIS — C3490 Malignant neoplasm of unspecified part of unspecified bronchus or lung: Secondary | ICD-10-CM

## 2014-04-20 DIAGNOSIS — J9601 Acute respiratory failure with hypoxia: Secondary | ICD-10-CM | POA: Diagnosis not present

## 2014-04-20 DIAGNOSIS — R0902 Hypoxemia: Secondary | ICD-10-CM | POA: Diagnosis not present

## 2014-04-20 DIAGNOSIS — J95821 Acute postprocedural respiratory failure: Secondary | ICD-10-CM | POA: Diagnosis not present

## 2014-04-20 DIAGNOSIS — S270XXD Traumatic pneumothorax, subsequent encounter: Secondary | ICD-10-CM | POA: Diagnosis not present

## 2014-04-20 DIAGNOSIS — Z9889 Other specified postprocedural states: Secondary | ICD-10-CM | POA: Diagnosis not present

## 2014-04-20 DIAGNOSIS — J181 Lobar pneumonia, unspecified organism: Secondary | ICD-10-CM

## 2014-04-20 DIAGNOSIS — E8770 Fluid overload, unspecified: Secondary | ICD-10-CM | POA: Diagnosis not present

## 2014-04-20 DIAGNOSIS — Z79811 Long term (current) use of aromatase inhibitors: Secondary | ICD-10-CM

## 2014-04-20 DIAGNOSIS — J9620 Acute and chronic respiratory failure, unspecified whether with hypoxia or hypercapnia: Secondary | ICD-10-CM

## 2014-04-20 DIAGNOSIS — Z87891 Personal history of nicotine dependence: Secondary | ICD-10-CM | POA: Diagnosis present

## 2014-04-20 HISTORY — PX: VIDEO ASSISTED THORACOSCOPY (VATS)/WEDGE RESECTION: SHX6174

## 2014-04-20 HISTORY — DX: Orthostatic hypotension: I95.1

## 2014-04-20 HISTORY — PX: VIDEO BRONCHOSCOPY: SHX5072

## 2014-04-20 HISTORY — PX: LOBECTOMY: SHX5089

## 2014-04-20 HISTORY — PX: NODE DISSECTION: SHX5269

## 2014-04-20 HISTORY — PX: WEDGE RESECTION: SHX5070

## 2014-04-20 LAB — BLOOD GAS, ARTERIAL
Acid-base deficit: 1.9 mmol/L (ref 0.0–2.0)
Bicarbonate: 22.2 mEq/L (ref 20.0–24.0)
Drawn by: 428831
FIO2: 0.21 %
O2 Saturation: 98.6 %
Patient temperature: 98.6
TCO2: 23.3 mmol/L (ref 0–100)
pCO2 arterial: 36.4 mmHg (ref 35.0–45.0)
pH, Arterial: 7.402 (ref 7.350–7.450)
pO2, Arterial: 140 mmHg — ABNORMAL HIGH (ref 80.0–100.0)

## 2014-04-20 SURGERY — BRONCHOSCOPY, VIDEO-ASSISTED
Anesthesia: General | Site: Chest | Laterality: Right

## 2014-04-20 MED ORDER — ALBUTEROL SULFATE (2.5 MG/3ML) 0.083% IN NEBU: 2.5000 mg | INHALATION_SOLUTION | RESPIRATORY_TRACT | Status: DC

## 2014-04-20 MED ORDER — BUSPIRONE HCL 5 MG PO TABS
5.0000 mg | ORAL_TABLET | Freq: Three times a day (TID) | ORAL | Status: DC
  Administered 2014-04-20 – 2014-05-02 (×36): 5 mg via ORAL
  Filled 2014-04-20 (×40): qty 1

## 2014-04-20 MED ORDER — DIPHENHYDRAMINE HCL 50 MG/ML IJ SOLN
INTRAMUSCULAR | Status: DC | PRN
  Administered 2014-04-20: 12.5 mg via INTRAVENOUS

## 2014-04-20 MED ORDER — EPHEDRINE SULFATE 50 MG/ML IJ SOLN
INTRAMUSCULAR | Status: AC
  Filled 2014-04-20: qty 1

## 2014-04-20 MED ORDER — ACETAMINOPHEN 500 MG PO TABS
1000.0000 mg | ORAL_TABLET | Freq: Four times a day (QID) | ORAL | Status: AC
  Administered 2014-04-20 – 2014-04-24 (×16): 1000 mg via ORAL
  Administered 2014-04-24: 500 mg via ORAL
  Administered 2014-04-25 (×2): 1000 mg via ORAL
  Filled 2014-04-20 (×20): qty 2

## 2014-04-20 MED ORDER — FENTANYL CITRATE 0.05 MG/ML IJ SOLN
INTRAMUSCULAR | Status: DC | PRN
  Administered 2014-04-20: 50 ug via INTRAVENOUS
  Administered 2014-04-20 (×2): 100 ug via INTRAVENOUS
  Administered 2014-04-20 (×3): 50 ug via INTRAVENOUS
  Administered 2014-04-20: 100 ug via INTRAVENOUS

## 2014-04-20 MED ORDER — TRAZODONE HCL 100 MG PO TABS
200.0000 mg | ORAL_TABLET | Freq: Every day | ORAL | Status: DC
  Administered 2014-04-21 – 2014-04-25 (×5): 200 mg via ORAL
  Administered 2014-04-26: 100 mg via ORAL
  Administered 2014-04-27: 200 mg via ORAL
  Administered 2014-04-27: 100 mg via ORAL
  Administered 2014-04-28 – 2014-05-01 (×4): 200 mg via ORAL
  Filled 2014-04-20 (×12): qty 2

## 2014-04-20 MED ORDER — HYDROMORPHONE HCL 1 MG/ML IJ SOLN
0.2500 mg | INTRAMUSCULAR | Status: DC | PRN
  Administered 2014-04-20 (×4): 0.5 mg via INTRAVENOUS

## 2014-04-20 MED ORDER — DEXTROSE 5 % IV SOLN
1.5000 g | Freq: Two times a day (BID) | INTRAVENOUS | Status: AC
  Administered 2014-04-20 – 2014-04-21 (×2): 1.5 g via INTRAVENOUS
  Filled 2014-04-20 (×2): qty 1.5

## 2014-04-20 MED ORDER — HYDROMORPHONE HCL 1 MG/ML IJ SOLN
INTRAMUSCULAR | Status: AC
  Administered 2014-04-20: 0.5 mg via INTRAVENOUS
  Filled 2014-04-20: qty 1

## 2014-04-20 MED ORDER — GLYCOPYRROLATE 0.2 MG/ML IJ SOLN
INTRAMUSCULAR | Status: DC | PRN
  Administered 2014-04-20: .6 mg via INTRAVENOUS

## 2014-04-20 MED ORDER — GLYCOPYRROLATE 0.2 MG/ML IJ SOLN
INTRAMUSCULAR | Status: AC
  Filled 2014-04-20: qty 3

## 2014-04-20 MED ORDER — SODIUM CHLORIDE 0.9 % IJ SOLN
INTRAMUSCULAR | Status: AC
  Filled 2014-04-20: qty 10

## 2014-04-20 MED ORDER — FENTANYL 10 MCG/ML IV SOLN
INTRAVENOUS | Status: DC
  Administered 2014-04-20: 60 ug via INTRAVENOUS
  Administered 2014-04-20: 195 ug via INTRAVENOUS
  Administered 2014-04-20: via INTRAVENOUS
  Administered 2014-04-21: 180 ug via INTRAVENOUS
  Administered 2014-04-21: 240 ug via INTRAVENOUS
  Administered 2014-04-21: 15 ug via INTRAVENOUS
  Administered 2014-04-21: 291.9 ug via INTRAVENOUS
  Administered 2014-04-21: 185 ug via INTRAVENOUS
  Administered 2014-04-21: 199 ug via INTRAVENOUS
  Filled 2014-04-20 (×4): qty 50

## 2014-04-20 MED ORDER — ROCURONIUM BROMIDE 50 MG/5ML IV SOLN
INTRAVENOUS | Status: AC
  Filled 2014-04-20: qty 2

## 2014-04-20 MED ORDER — FENTANYL CITRATE 0.05 MG/ML IJ SOLN
INTRAMUSCULAR | Status: AC
  Filled 2014-04-20: qty 5

## 2014-04-20 MED ORDER — METOPROLOL TARTRATE 100 MG PO TABS
100.0000 mg | ORAL_TABLET | ORAL | Status: DC
  Administered 2014-04-21 – 2014-05-02 (×11): 100 mg via ORAL
  Filled 2014-04-20 (×16): qty 1

## 2014-04-20 MED ORDER — NEOSTIGMINE METHYLSULFATE 10 MG/10ML IV SOLN
INTRAVENOUS | Status: DC | PRN
  Administered 2014-04-20: 4 mg via INTRAVENOUS

## 2014-04-20 MED ORDER — ONDANSETRON HCL 4 MG/2ML IJ SOLN: 4.0000 mg | Freq: Four times a day (QID) | INTRAMUSCULAR | Status: DC | PRN

## 2014-04-20 MED ORDER — ROCURONIUM BROMIDE 100 MG/10ML IV SOLN
INTRAVENOUS | Status: DC | PRN
  Administered 2014-04-20 (×2): 10 mg via INTRAVENOUS
  Administered 2014-04-20: 40 mg via INTRAVENOUS
  Administered 2014-04-20: 30 mg via INTRAVENOUS
  Administered 2014-04-20: 10 mg via INTRAVENOUS
  Administered 2014-04-20: 20 mg via INTRAVENOUS
  Administered 2014-04-20 (×2): 10 mg via INTRAVENOUS

## 2014-04-20 MED ORDER — PANTOPRAZOLE SODIUM 40 MG IV SOLR
40.0000 mg | Freq: Two times a day (BID) | INTRAVENOUS | Status: DC
  Administered 2014-04-20 – 2014-04-23 (×6): 40 mg via INTRAVENOUS
  Filled 2014-04-20 (×8): qty 40

## 2014-04-20 MED ORDER — PROPOFOL 10 MG/ML IV BOLUS
INTRAVENOUS | Status: AC
  Filled 2014-04-20: qty 20

## 2014-04-20 MED ORDER — PRAVASTATIN SODIUM 40 MG PO TABS
40.0000 mg | ORAL_TABLET | Freq: Every day | ORAL | Status: DC
  Administered 2014-04-21 – 2014-05-02 (×12): 40 mg via ORAL
  Filled 2014-04-20 (×12): qty 1

## 2014-04-20 MED ORDER — DEXAMETHASONE SODIUM PHOSPHATE 4 MG/ML IJ SOLN
INTRAMUSCULAR | Status: AC
  Filled 2014-04-20: qty 1

## 2014-04-20 MED ORDER — PROPOFOL 10 MG/ML IV BOLUS
INTRAVENOUS | Status: DC | PRN
  Administered 2014-04-20: 50 mg via INTRAVENOUS
  Administered 2014-04-20: 100 mg via INTRAVENOUS

## 2014-04-20 MED ORDER — PANTOPRAZOLE SODIUM 40 MG IV SOLR
40.0000 mg | Freq: Once | INTRAVENOUS | Status: AC
  Administered 2014-04-20: 40 mg via INTRAVENOUS
  Filled 2014-04-20: qty 40

## 2014-04-20 MED ORDER — ENOXAPARIN SODIUM 40 MG/0.4ML ~~LOC~~ SOLN
40.0000 mg | SUBCUTANEOUS | Status: DC
  Administered 2014-04-21 – 2014-05-01 (×10): 40 mg via SUBCUTANEOUS
  Filled 2014-04-20 (×13): qty 0.4

## 2014-04-20 MED ORDER — FENTANYL 10 MCG/ML IV SOLN
INTRAVENOUS | Status: AC
  Administered 2014-04-20: 13:00:00 via INTRAVENOUS
  Filled 2014-04-20: qty 50

## 2014-04-20 MED ORDER — PHENYLEPHRINE 40 MCG/ML (10ML) SYRINGE FOR IV PUSH (FOR BLOOD PRESSURE SUPPORT)
PREFILLED_SYRINGE | INTRAVENOUS | Status: AC
  Filled 2014-04-20: qty 10

## 2014-04-20 MED ORDER — ANASTROZOLE 1 MG PO TABS
1.0000 mg | ORAL_TABLET | Freq: Every day | ORAL | Status: DC
Start: 2014-04-20 — End: 2014-05-02
  Administered 2014-04-21 – 2014-05-02 (×12): 1 mg via ORAL
  Filled 2014-04-20 (×17): qty 1

## 2014-04-20 MED ORDER — BUPIVACAINE HCL (PF) 0.5 % IJ SOLN
INTRAMUSCULAR | Status: AC
  Filled 2014-04-20: qty 10

## 2014-04-20 MED ORDER — ARTIFICIAL TEARS OP OINT
TOPICAL_OINTMENT | OPHTHALMIC | Status: DC | PRN
Start: 2014-04-20 — End: 2014-04-20
  Administered 2014-04-20: 1 via OPHTHALMIC

## 2014-04-20 MED ORDER — ASPIRIN EC 81 MG PO TBEC
81.0000 mg | DELAYED_RELEASE_TABLET | Freq: Every day | ORAL | Status: DC
  Administered 2014-04-21 – 2014-05-02 (×12): 81 mg via ORAL
  Filled 2014-04-20 (×12): qty 1

## 2014-04-20 MED ORDER — ACETAMINOPHEN 160 MG/5ML PO SOLN
1000.0000 mg | Freq: Four times a day (QID) | ORAL | Status: AC
Start: 2014-04-20 — End: 2014-04-25
  Filled 2014-04-20: qty 40

## 2014-04-20 MED ORDER — ASPIRIN 81 MG PO TABS: 81.0000 mg | ORAL_TABLET | Freq: Every day | ORAL | Status: DC

## 2014-04-20 MED ORDER — ONDANSETRON HCL 4 MG/2ML IJ SOLN
INTRAMUSCULAR | Status: AC
  Filled 2014-04-20: qty 2

## 2014-04-20 MED ORDER — LIDOCAINE HCL (PF) 1 % IJ SOLN
INTRAMUSCULAR | Status: AC
  Filled 2014-04-20: qty 30

## 2014-04-20 MED ORDER — HEMOSTATIC AGENTS (NO CHARGE) OPTIME
TOPICAL | Status: DC | PRN
  Administered 2014-04-20: 1 via TOPICAL

## 2014-04-20 MED ORDER — PROMETHAZINE HCL 25 MG/ML IJ SOLN
INTRAMUSCULAR | Status: AC
  Filled 2014-04-20: qty 1

## 2014-04-20 MED ORDER — MEPERIDINE HCL 25 MG/ML IJ SOLN: 6.2500 mg | INTRAMUSCULAR | Status: DC | PRN

## 2014-04-20 MED ORDER — NALOXONE HCL 0.4 MG/ML IJ SOLN: 0.4000 mg | INTRAMUSCULAR | Status: DC | PRN

## 2014-04-20 MED ORDER — MIDAZOLAM HCL 2 MG/2ML IJ SOLN
INTRAMUSCULAR | Status: AC
  Filled 2014-04-20: qty 2

## 2014-04-20 MED ORDER — ROCURONIUM BROMIDE 50 MG/5ML IV SOLN
INTRAVENOUS | Status: AC
  Filled 2014-04-20: qty 1

## 2014-04-20 MED ORDER — POTASSIUM CHLORIDE 10 MEQ/50ML IV SOLN
10.0000 meq | Freq: Every day | INTRAVENOUS | Status: DC | PRN
  Administered 2014-04-21 (×3): 10 meq via INTRAVENOUS
  Filled 2014-04-20 (×3): qty 50

## 2014-04-20 MED ORDER — ALBUTEROL SULFATE (2.5 MG/3ML) 0.083% IN NEBU
2.5000 mg | INHALATION_SOLUTION | RESPIRATORY_TRACT | Status: DC
  Administered 2014-04-20 – 2014-04-23 (×14): 2.5 mg via RESPIRATORY_TRACT
  Filled 2014-04-20 (×14): qty 3

## 2014-04-20 MED ORDER — DIPHENHYDRAMINE HCL 50 MG/ML IJ SOLN: 12.5000 mg | Freq: Four times a day (QID) | INTRAMUSCULAR | Status: DC | PRN

## 2014-04-20 MED ORDER — LACTATED RINGERS IV SOLN
INTRAVENOUS | Status: DC | PRN
  Administered 2014-04-20 (×3): via INTRAVENOUS

## 2014-04-20 MED ORDER — 0.9 % SODIUM CHLORIDE (POUR BTL) OPTIME
TOPICAL | Status: DC | PRN
  Administered 2014-04-20: 2000 mL

## 2014-04-20 MED ORDER — BISACODYL 5 MG PO TBEC
10.0000 mg | DELAYED_RELEASE_TABLET | Freq: Every day | ORAL | Status: DC
  Administered 2014-04-21 – 2014-05-01 (×8): 10 mg via ORAL
  Filled 2014-04-20 (×9): qty 2

## 2014-04-20 MED ORDER — ENOXAPARIN SODIUM 40 MG/0.4ML ~~LOC~~ SOLN
40.0000 mg | SUBCUTANEOUS | Status: DC
  Filled 2014-04-20: qty 0.4

## 2014-04-20 MED ORDER — PHENYLEPHRINE HCL 10 MG/ML IJ SOLN
INTRAMUSCULAR | Status: DC | PRN
  Administered 2014-04-20 (×2): 40 ug via INTRAVENOUS
  Administered 2014-04-20: 80 ug via INTRAVENOUS
  Administered 2014-04-20 (×2): 40 ug via INTRAVENOUS
  Administered 2014-04-20: 80 ug via INTRAVENOUS

## 2014-04-20 MED ORDER — SUFENTANIL CITRATE 50 MCG/ML IV SOLN
INTRAVENOUS | Status: DC | PRN
Start: 2014-04-20 — End: 2014-04-20
  Administered 2014-04-20: 10 ug via INTRAVENOUS
  Administered 2014-04-20: 5 ug via INTRAVENOUS
  Administered 2014-04-20: 10 ug via INTRAVENOUS
  Administered 2014-04-20: 5 ug via INTRAVENOUS
  Administered 2014-04-20 (×3): 10 ug via INTRAVENOUS

## 2014-04-20 MED ORDER — DIPHENHYDRAMINE HCL 12.5 MG/5ML PO ELIX
12.5000 mg | ORAL_SOLUTION | Freq: Four times a day (QID) | ORAL | Status: DC | PRN
  Filled 2014-04-20: qty 5

## 2014-04-20 MED ORDER — STERILE WATER FOR INJECTION IJ SOLN
INTRAMUSCULAR | Status: AC
  Filled 2014-04-20: qty 10

## 2014-04-20 MED ORDER — SODIUM CHLORIDE 0.9 % IJ SOLN: 9.0000 mL | INTRAMUSCULAR | Status: DC | PRN

## 2014-04-20 MED ORDER — GABAPENTIN 800 MG PO TABS
800.0000 mg | ORAL_TABLET | Freq: Three times a day (TID) | ORAL | Status: DC
  Filled 2014-04-20 (×2): qty 1

## 2014-04-20 MED ORDER — DEXAMETHASONE SODIUM PHOSPHATE 4 MG/ML IJ SOLN
INTRAMUSCULAR | Status: DC | PRN
  Administered 2014-04-20: 4 mg via INTRAVENOUS

## 2014-04-20 MED ORDER — DEXTROSE-NACL 5-0.9 % IV SOLN
INTRAVENOUS | Status: DC
  Administered 2014-04-20 – 2014-04-21 (×3): 125 mL/h via INTRAVENOUS

## 2014-04-20 MED ORDER — DEXTROSE 5 % IV SOLN
1.5000 g | Freq: Once | INTRAVENOUS | Status: DC
  Filled 2014-04-20: qty 1.5

## 2014-04-20 MED ORDER — GABAPENTIN 400 MG PO CAPS
800.0000 mg | ORAL_CAPSULE | Freq: Three times a day (TID) | ORAL | Status: DC
  Administered 2014-04-20 – 2014-05-02 (×34): 800 mg via ORAL
  Filled 2014-04-20 (×37): qty 2

## 2014-04-20 MED ORDER — BUPIVACAINE 0.5 % ON-Q PUMP SINGLE CATH 400 ML
400.0000 mL | INJECTION | Status: DC
  Filled 2014-04-20: qty 400

## 2014-04-20 MED ORDER — ONDANSETRON HCL 4 MG/2ML IJ SOLN
INTRAMUSCULAR | Status: DC | PRN
  Administered 2014-04-20: 4 mg via INTRAVENOUS

## 2014-04-20 MED ORDER — SENNOSIDES-DOCUSATE SODIUM 8.6-50 MG PO TABS
1.0000 | ORAL_TABLET | Freq: Every day | ORAL | Status: DC
  Administered 2014-04-20 – 2014-05-01 (×11): 1 via ORAL
  Filled 2014-04-20 (×13): qty 1

## 2014-04-20 MED ORDER — SUFENTANIL CITRATE 50 MCG/ML IV SOLN
INTRAVENOUS | Status: AC
  Filled 2014-04-20: qty 1

## 2014-04-20 MED ORDER — METOPROLOL TARTRATE 50 MG PO TABS
50.0000 mg | ORAL_TABLET | Freq: Every day | ORAL | Status: DC
  Administered 2014-04-21 – 2014-05-01 (×11): 50 mg via ORAL
  Filled 2014-04-20 (×13): qty 1

## 2014-04-20 MED ORDER — ARTIFICIAL TEARS OP OINT
TOPICAL_OINTMENT | OPHTHALMIC | Status: AC
  Filled 2014-04-20: qty 3.5

## 2014-04-20 MED ORDER — EPHEDRINE SULFATE 50 MG/ML IJ SOLN
INTRAMUSCULAR | Status: DC | PRN
  Administered 2014-04-20 (×5): 5 mg via INTRAVENOUS
  Administered 2014-04-20: 10 mg via INTRAVENOUS
  Administered 2014-04-20: 5 mg via INTRAVENOUS

## 2014-04-20 MED ORDER — MIDAZOLAM HCL 5 MG/5ML IJ SOLN
INTRAMUSCULAR | Status: DC | PRN
  Administered 2014-04-20 (×2): 1 mg via INTRAVENOUS

## 2014-04-20 MED ORDER — LIDOCAINE HCL (CARDIAC) 20 MG/ML IV SOLN
INTRAVENOUS | Status: AC
  Filled 2014-04-20: qty 5

## 2014-04-20 MED ORDER — PROMETHAZINE HCL 25 MG/ML IJ SOLN
6.2500 mg | INTRAMUSCULAR | Status: DC | PRN
  Administered 2014-04-20: 12.5 mg via INTRAVENOUS

## 2014-04-20 SURGICAL SUPPLY — 102 items
ADH SKN CLS APL DERMABOND .7 (GAUZE/BANDAGES/DRESSINGS) ×2
APL SRG 22X2 LUM MLBL SLNT (VASCULAR PRODUCTS)
APL SRG 7X2 LUM MLBL SLNT (VASCULAR PRODUCTS)
APPLICATOR TIP COSEAL (VASCULAR PRODUCTS) IMPLANT
APPLICATOR TIP EXT COSEAL (VASCULAR PRODUCTS) IMPLANT
BAG SPEC RTRVL LRG 6X4 10 (ENDOMECHANICALS) ×2
BLADE SURG 11 STRL SS (BLADE) ×1 IMPLANT
CANISTER SUCTION 2500CC (MISCELLANEOUS) ×3 IMPLANT
CATH KIT ON Q 5IN SLV (PAIN MANAGEMENT) ×1 IMPLANT
CATH THORACIC 28FR (CATHETERS) ×2 IMPLANT
CATH THORACIC 36FR (CATHETERS) IMPLANT
CATH THORACIC 36FR RT ANG (CATHETERS) IMPLANT
CLIP TI MEDIUM 6 (CLIP) ×3 IMPLANT
CONN ST 1/4X3/8  BEN (MISCELLANEOUS) ×1
CONN ST 1/4X3/8 BEN (MISCELLANEOUS) IMPLANT
CONT SPEC 4OZ CLIKSEAL STRL BL (MISCELLANEOUS) ×11 IMPLANT
COVER TABLE BACK 60X90 (DRAPES) ×3 IMPLANT
DERMABOND ADVANCED (GAUZE/BANDAGES/DRESSINGS) ×1
DERMABOND ADVANCED .7 DNX12 (GAUZE/BANDAGES/DRESSINGS) IMPLANT
DRAIN CHANNEL 28F RND 3/8 FF (WOUND CARE) ×1 IMPLANT
DRAIN CHANNEL 32F RND 10.7 FF (WOUND CARE) IMPLANT
DRAPE LAPAROSCOPIC ABDOMINAL (DRAPES) ×3 IMPLANT
DRAPE WARM FLUID 44X44 (DRAPE) ×3 IMPLANT
DRILL BIT 7/64X5 (BIT) IMPLANT
ELECT BLADE 4.0 EZ CLEAN MEGAD (MISCELLANEOUS) ×3
ELECT REM PT RETURN 9FT ADLT (ELECTROSURGICAL) ×3
ELECTRODE BLDE 4.0 EZ CLN MEGD (MISCELLANEOUS) ×2 IMPLANT
ELECTRODE REM PT RTRN 9FT ADLT (ELECTROSURGICAL) ×2 IMPLANT
GAUZE SPONGE 4X4 12PLY STRL (GAUZE/BANDAGES/DRESSINGS) ×3 IMPLANT
GLOVE BIO SURGEON STRL SZ 6.5 (GLOVE) ×8 IMPLANT
GLOVE BIO SURGEON STRL SZ7.5 (GLOVE) ×2 IMPLANT
GLOVE BIOGEL PI IND STRL 6.5 (GLOVE) IMPLANT
GLOVE BIOGEL PI IND STRL 7.0 (GLOVE) IMPLANT
GLOVE BIOGEL PI INDICATOR 6.5 (GLOVE) ×5
GLOVE BIOGEL PI INDICATOR 7.0 (GLOVE) ×2
GLOVE ECLIPSE 6.5 STRL STRAW (GLOVE) ×5 IMPLANT
GOWN STRL REUS W/ TWL LRG LVL3 (GOWN DISPOSABLE) ×8 IMPLANT
GOWN STRL REUS W/TWL LRG LVL3 (GOWN DISPOSABLE) ×24
HANDLE STAPLE ENDO GIA SHORT (STAPLE) ×1
KIT BASIN OR (CUSTOM PROCEDURE TRAY) ×3 IMPLANT
KIT CLEAN ENDO COMPLIANCE (KITS) ×3 IMPLANT
KIT ROOM TURNOVER OR (KITS) ×3 IMPLANT
KIT SUCTION CATH 14FR (SUCTIONS) ×3 IMPLANT
NS IRRIG 1000ML POUR BTL (IV SOLUTION) ×12 IMPLANT
OIL SILICONE PENTAX (PARTS (SERVICE/REPAIRS)) ×3 IMPLANT
PACK CHEST (CUSTOM PROCEDURE TRAY) ×3 IMPLANT
PAD ARMBOARD 7.5X6 YLW CONV (MISCELLANEOUS) ×9 IMPLANT
PASSER SUT SWANSON 36MM LOOP (INSTRUMENTS) ×2 IMPLANT
POUCH ENDO CATCH II 15MM (MISCELLANEOUS) ×2 IMPLANT
POUCH SPECIMEN RETRIEVAL 10MM (ENDOMECHANICALS) ×1 IMPLANT
RELOAD EGIA 45 MED/THCK PURPLE (STAPLE) ×6 IMPLANT
RELOAD EGIA BLACK ROTIC 45MM (STAPLE) ×3 IMPLANT
RELOAD EGIA TRIS TAN 45 CVD (STAPLE) ×12 IMPLANT
RELOAD STAPLE 45 BLK XTHK (STAPLE) IMPLANT
RELOAD STAPLE 45 TAN MED CVD (STAPLE) IMPLANT
SCISSORS LAP 5X35 DISP (ENDOMECHANICALS) IMPLANT
SEALANT PROGEL (MISCELLANEOUS) IMPLANT
SEALANT SURG COSEAL 4ML (VASCULAR PRODUCTS) ×1 IMPLANT
SEALANT SURG COSEAL 8ML (VASCULAR PRODUCTS) IMPLANT
SOLUTION ANTI FOG 6CC (MISCELLANEOUS) ×3 IMPLANT
SPECIMEN JAR MEDIUM (MISCELLANEOUS) ×1 IMPLANT
SPONGE GAUZE 4X4 12PLY STER LF (GAUZE/BANDAGES/DRESSINGS) ×1 IMPLANT
SPONGE INTESTINAL PEANUT (DISPOSABLE) ×3 IMPLANT
STAPLER ENDO GIA 12 SHRT THIN (STAPLE) IMPLANT
STAPLER ENDO GIA 12MM SHORT (STAPLE) ×2 IMPLANT
SUT PROLENE 3 0 SH DA (SUTURE) IMPLANT
SUT PROLENE 4 0 RB 1 (SUTURE)
SUT PROLENE 4-0 RB1 .5 CRCL 36 (SUTURE) IMPLANT
SUT SILK  1 MH (SUTURE) ×4
SUT SILK 1 MH (SUTURE) ×4 IMPLANT
SUT SILK 1 TIES 10X30 (SUTURE) IMPLANT
SUT SILK 2 0 SH (SUTURE) IMPLANT
SUT SILK 2 0SH CR/8 30 (SUTURE) ×1 IMPLANT
SUT SILK 3 0SH CR/8 30 (SUTURE) IMPLANT
SUT STEEL 1 (SUTURE) IMPLANT
SUT VIC AB 0 CTX 18 (SUTURE) ×2 IMPLANT
SUT VIC AB 1 CTX 18 (SUTURE) IMPLANT
SUT VIC AB 1 CTX 36 (SUTURE) ×3
SUT VIC AB 1 CTX36XBRD ANBCTR (SUTURE) IMPLANT
SUT VIC AB 2-0 CTX 36 (SUTURE) ×1 IMPLANT
SUT VIC AB 2-0 UR6 27 (SUTURE) IMPLANT
SUT VIC AB 3-0 SH 8-18 (SUTURE) IMPLANT
SUT VIC AB 3-0 X1 27 (SUTURE) ×1 IMPLANT
SUT VICRYL 0 UR6 27IN ABS (SUTURE) IMPLANT
SUT VICRYL 2 TP 1 (SUTURE) ×1 IMPLANT
SWAB COLLECTION DEVICE MRSA (MISCELLANEOUS) IMPLANT
SYR 20ML ECCENTRIC (SYRINGE) ×3 IMPLANT
SYSTEM SAHARA CHEST DRAIN ATS (WOUND CARE) ×3 IMPLANT
TAPE CLOTH SURG 4X10 WHT LF (GAUZE/BANDAGES/DRESSINGS) ×2 IMPLANT
TAPE UMBILICAL COTTON 1/8X30 (MISCELLANEOUS) ×4 IMPLANT
TIP APPLICATOR SPRAY EXTEND 16 (VASCULAR PRODUCTS) ×1 IMPLANT
TOWEL OR 17X24 6PK STRL BLUE (TOWEL DISPOSABLE) ×6 IMPLANT
TOWEL OR 17X26 10 PK STRL BLUE (TOWEL DISPOSABLE) ×6 IMPLANT
TRAP SPECIMEN MUCOUS 40CC (MISCELLANEOUS) ×3 IMPLANT
TRAY FOLEY CATH 14FRSI W/METER (CATHETERS) ×2 IMPLANT
TROCAR BLADELESS 11MM (ENDOMECHANICALS) ×1 IMPLANT
TROCAR XCEL BLADELESS 5X75MML (TROCAR) ×1 IMPLANT
TROCAR XCEL BLUNT TIP 100MML (ENDOMECHANICALS) ×2 IMPLANT
TUBE ANAEROBIC SPECIMEN COL (MISCELLANEOUS) IMPLANT
TUBE CONNECTING 20X1/4 (TUBING) ×7 IMPLANT
TUNNELER SHEATH ON-Q 11GX8 DSP (PAIN MANAGEMENT) ×1 IMPLANT
WATER STERILE IRR 1000ML POUR (IV SOLUTION) ×6 IMPLANT

## 2014-04-20 NOTE — Progress Notes (Signed)
Patient ID: MALONE ADMIRE, female   DOB: May 09, 1955, 59 y.o.   MRN: [id number] EVENING ROUNDS NOTE :     [address]       [place] [address]             [phone]                 Day of Surgery Procedure(s) (LRB): VIDEO BRONCHOSCOPY (N/A) VIDEO ASSISTED THORACOSCOPY  (Right) WEDGE RESECTION, RIGHT UPPER LOBE (Right) LOBECTOMY, RIGHT UPPER LOBE WITH PLACEMENT OF ONQ PAIN PUMP (Right) NODE DISSECTION (Right)  Total Length of Stay:  LOS: 0 days  BP 112/75 mmHg  Pulse 103  Temp(Src) 98.5 F (36.9 C) (Oral)  Resp 20  Ht 5\' 1"  (1.549 m)  Wt 163 lb (73.936 kg)  BMI 30.81 kg/m2  SpO2 99%  .Intake/Output      03/28 0701 - 03/29 0700   I.V. (mL/kg) 2775 (37.5)   Total Intake(mL/kg) 2775 (37.5)   Urine (mL/kg/hr) 1395 (1.6)   Blood 100 (0.1)   Chest Tube 120 (0.1)   Total Output 1615   Net +1160         . bupivacaine 0.5 % ON-Q pump SINGLE CATH 400 mL    . dextrose 5 % and 0.9% NaCl 125 mL/hr (04/20/14 1548)     Lab Results  Component Value Date   WBC 7.5 04/17/2014   HGB 13.4 04/17/2014   HCT 38.7 04/17/2014   PLT 232 04/17/2014   GLUCOSE 110* 04/17/2014   ALT 40* 04/17/2014   AST 53* 04/17/2014   NA 134* 04/17/2014   K 4.7 04/17/2014   CL 95* 04/17/2014   CREATININE 0.84 04/17/2014   BUN 14 04/17/2014   CO2 28 04/17/2014   INR 0.94 04/17/2014   No air leak  Good pain control   Grace Isaac MD  Beeper [phone] Office [phone] 04/20/2014 7:08 PM

## 2014-04-20 NOTE — Progress Notes (Signed)
Sign out request to Dr Judson Roch

## 2014-04-20 NOTE — Transfer of Care (Signed)
Immediate Anesthesia Transfer of Care Note  Patient: Gabrielle [name]  Procedure(s) Performed: Procedure(s): VIDEO BRONCHOSCOPY (N/A) VIDEO ASSISTED THORACOSCOPY  (Right) WEDGE RESECTION, RIGHT UPPER LOBE (Right) LOBECTOMY, RIGHT UPPER LOBE WITH PLACEMENT OF ONQ PAIN PUMP (Right) NODE DISSECTION (Right)  Patient Location: PACU  Anesthesia Type:General  Level of Consciousness: awake, alert  and oriented  Airway & Oxygen Therapy: Patient Spontanous Breathing and Patient connected to face mask oxygen  Post-op Assessment: Report given to RN, Post -op Vital signs reviewed and stable and Patient moving all extremities X 4  Post vital signs: Reviewed and stable  Last Vitals:  Filed Vitals:   04/20/14 1300  BP:   Pulse: 97  Temp: 36 C  Resp:     Complications: No apparent anesthesia complications

## 2014-04-20 NOTE — Brief Op Note (Addendum)
      [address]       [place] [address]             [phone]     04/20/2014  12:37 PM  PATIENT:  Gabrielle [name]  59 y.o. female  PRE-OPERATIVE DIAGNOSIS:  ENLARGING RIGHT PULOMONARY NODULE  POST-OPERATIVE DIAGNOSIS: Adenocarcinoma of the Right Upper lobe    PROCEDURE:  Procedure(s): VIDEO BRONCHOSCOPY VIDEO ASSISTED THORACOSCOPY  WEDGE RESECTION, RIGHT UPPER LOBE COMPLETION LOBECTOMY, RIGHT UPPER LOBE  PLACEMENT OF ONQ PAIN PUMP LYMPH NODE DISSECTION  SURGEON:  Surgeon(s): Grace Isaac, MD  PHYSICIAN ASSISTANT: WAYNE GOLD PA-C  ANESTHESIA:   general  SPECIMEN:  Source of Specimen:  RIGHT UPPER LOBE AND MULTIPLE LN SAMPLES  DISPOSITION OF SPECIMEN:  Pathology  DRAINS: 2 CHEST TUBES IN THE RIGHT HEMITHORAX  PATIENT CONDITION:  PACU - hemodynamically stable.  PRE-OPERATIVE WEIGHT: 73kg  EBL 130 ML  COMPLICATIONS: NO KNOWN  FROZEN: C/W ADENOCARCINOMA

## 2014-04-20 NOTE — H&P (Signed)
[address]       [place] [address]             [phone]                    Gabrielle [name] [facility] Medical Record #[id number] Date of Birth: 08/30/1955  Referring: No ref. provider found Primary Care: Celedonio Savage, MD  Chief Complaint:    No chief complaint on file.   History of Present Illness:    Gabrielle [name] 59 y.o. female  Was  seen in the office for right upper lung nodule. Patient has a previous history of  stage I, grade 1 cancer the right breast ER positive greater than 90%, PR positive greater than 90%, HER-2/neu negative. She had surgery on May 03, 2012 followed by radiation therapy which was completed on 08/15/2012. She stared anastrozole 1 mg a day 08/23/2012. She was found to be postmenopausal to time of initiation of therapy. She had 3 negative sentinel nodes, no LVI, clear margins, and her Oncotype DX score was very low at 6.   A CT scan of the chest done in 2014 10 mm right upper lobe lung nodule, subsequent CT shows this to have grown very slightly and become more solid in appearance. PET scan was recently performed and the patient is referred for consideration of resection.   On August 30, 2012 she did have a negative EGD and colonoscopy by Dr. Anthony Sar.  She always is mildly hyponatremic and that has not changed she states. She is still on a diuretic. She is also on anticholesterol medication. That includes a cholestyramine but she is also on the Pravachol.  Her bipolar disorder seems to be stable on therapy.  She is also status post bilateral breast implants many years ago   She continues smoking she tried Chantix.   Breast cancer  05/03/2012 Initial Diagnosis Right-sided lumpectomy and sentinel node biopsy confirms stage I grade 1 node-negative ER-positive. Positive HER-2 negative breast cancer with a low Oncotype DX  - 08/15/2012 Radiation Radiation therapy completed to the right breast  08/23/2012 - Hormone Therapy  Arimidex initiated after completing radiation therapy        Current Activity/ Functional Status:  Patient is independent with mobility/ambulation, transfers, ADL's, IADL's.   Zubrod Score: At the time of surgery this patient's most appropriate activity status/level should be described as: _0     0    Normal activity, no symptoms _1     1    Restricted in physical strenuous activity but ambulatory, able to do out light work _2     2    Ambulatory and capable of self care, unable to do work activities, up and about               >50 % of waking hours                              _3     3    Only limited self care, in bed greater than 50% of waking hours _4     4    Completely disabled, no self care, confined to bed or chair _5     5    Moribund   Past Medical History  Diagnosis Date  . Hyperlipidemia   . Carpal tunnel syndrome   . Chronic pain in left foot   . GERD (gastroesophageal reflux disease)   .  History of anemia   . H/O eating disorder   . Bipolar depression   . Breast cancer 04/2012    right  . Invasive ductal carcinoma of right breast 08/21/2012  . Tobacco abuse 08/21/2012    > 40 pack years  . S/P bilateral breast implants 08/21/2012    Prior to cancer diagnosis  . Diarrhea   . Cough   . Pneumonia   . Complication of anesthesia     pt says she gags really bad from secreation and reflux  . Orthostatic hypotension     Past Surgical History  Procedure Laterality Date  . Breast lumpectomy Left 1990    [facility]  . Breast lumpectomy with sentinel lymph node biopsy Right 04/2012  . Abdominal hysterectomy      vaginal - still both ovaries  . Tonsillectomy      Family History  Problem Relation Age of Onset  . Hypertension Mother   . Hypertension Father   . Lymphoma Maternal Aunt 77  . Leukemia Maternal Grandmother     dx in her late 99s    History   Social History  . Marital Status: Divorced    Spouse Name: N/A  . Number of Children: 1  . Years of Education:  N/A   Occupational History  .     Social History Main Topics  . Smoking status: Current Every Day Smoker -- 1.00 packs/day for 40 years    Types: Cigarettes  . Smokeless tobacco: Never Used  . Alcohol Use: 12.0 oz/week    20 Cans of beer per week     Comment: drinks 3 or 4 beers 3 - 4 times weekly  . Drug Use: No     Comment: history of substance abuse - none now  . Sexual Activity: No   Other Topics Concern  . Not on file   Social History Narrative    History  Smoking status  . Current Every Day Smoker -- 1.00 packs/day for 40 years  . Types: Cigarettes  Smokeless tobacco  . Never Used    History  Alcohol Use  . 12.0 oz/week  . 20 Cans of beer per week    Comment: drinks 3 or 4 beers 3 - 4 times weekly     Allergies  Allergen Reactions  . Hydrocodone     Other reaction(s): Other Makes her hyper  . Metronidazole Nausea Only    Makes pt feel sick    Current Facility-Administered Medications  Medication Dose Route Frequency Provider Last Rate Last Dose  . 0.9 % irrigation (POUR BTL)    PRN Grace Isaac, MD   2,000 mL at 04/20/14 0717  . cefUROXime (ZINACEF) 1.5 g in dextrose 5 % 50 mL IVPB  1.5 g Intravenous 60 min Pre-Op Grace Isaac, MD       Facility-Administered Medications Ordered in Other Encounters  Medication Dose Route Frequency Provider Last Rate Last Dose  . fentaNYL (SUBLIMAZE) injection    Anesthesia Intra-op Ollen Bowl, CRNA   50 mcg at 04/20/14 [phone]  . midazolam (VERSED) 5 MG/5ML injection    Anesthesia Intra-op Durwin Glaze Flowers, CRNA   1 mg at 04/20/14 3154      Review of Systems:     Cardiac Review of Systems: Y or N  Chest Pain [  n  ]  Resting SOB [   ] Exertional SOB  [n  ]  Orthopnea [ n ]   Pedal Edema [ n  ]  Palpitations [ y ] Syncope  [ n ]   Presyncope [  n ]  General Review of Systems: [Y] = yes [  ]=no Constitional: recent weight change [ n ];  Wt loss over the last 3 months [   ] anorexia [  ]; fatigue [   ]; nausea [  ]; night sweats [  ]; fever [  ]; or chills [  ];          Dental: poor dentition[ n ]; Last Dentist visit:   Eye : blurred vision [  ]; diplopia [   ]; vision changes [  ];  Amaurosis fugax[  ]; Resp: cough [  ];  wheezing[y  ];  hemoptysis[n  ]; shortness of breath[ n ]; paroxysmal nocturnal dyspnea[  ]; dyspnea on exertion[y  ]; or orthopnea[  ];  GI:  gallstones[  ], vomiting[  ];  dysphagia[  ]; melena[  ];  hematochezia [  ]; heartburn[  ];   Hx of  Colonoscopy[  ]; GU: kidney stones [  ]; hematuria[  ];   dysuria [  ];  nocturia[  ];  history of     obstruction [  ]; urinary frequency [  ]             Skin: rash, swelling[  ];, hair loss[  ];  peripheral edema[  ];  or itching[  ]; Musculosketetal: myalgias[  ];  joint swelling[  ];  joint erythema[  ];  joint pain[  ];  back pain[  ];  Heme/Lymph: bruising[  ];  bleeding[  ];  anemia[  ];  Neuro: TIA[  ];  headaches[  ];  stroke[  ];  vertigo[  ];  seizures[  ];   paresthesias[ n ];  difficulty walking[  n];  Psych:depression[  ]; anxiety[  ];  Endocrine: diabetes[ n ];  thyroid dysfunction[n ];  Immunizations: Flu up to date [ y ]; Pneumococcal up to date Florencio.Farrier  ];  Other:  Physical Exam: BP 178/85 mmHg  Pulse 76  Temp(Src) 97.7 F (36.5 C) (Oral)  Resp 20  Wt 163 lb (73.936 kg)  SpO2 99%  PHYSICAL EXAMINATION: General appearance: alert and cooperative Head: Normocephalic, without obvious abnormality, atraumatic Neck: no adenopathy, no carotid bruit, no JVD, supple, symmetrical, trachea midline and thyroid not enlarged, symmetric, no tenderness/mass/nodules Lymph nodes: Cervical, supraclavicular, and axillary nodes normal. Resp: clear to auscultation bilaterally Back: symmetric, no curvature. ROM normal. No CVA tenderness. Cardio: regular rate and rhythm, S1, S2 normal, no murmur, click, rub or gallop GI: soft, non-tender; bowel sounds normal; no masses,  no organomegaly Extremities: extremities normal, atraumatic,  no cyanosis or edema and Homans sign is negative, no sign of DVT Neurologic: Grossly normal No carotid bruits Palpable DP and PT pulses bilaterally Diagnostic Studies & Laboratory data:     Recent Radiology Findings:  CT scan Diagnostic report text  CLINICAL DATA: Lung nodule, followup ; history RIGHT breast cancer post lumpectomy, smoker, hypertension, cough productive of yellow material worse in morning  EXAM: CT CHEST WITHOUT CONTRAST  TECHNIQUE: Multidetector CT imaging of the chest was performed following the standard protocol without IV contrast. Sagittal and coronal MPR images reconstructed from axial data set.  COMPARISON: 10/02/2012, 03/04/2013  FINDINGS: BILATERAL breast prostheses with scattered capsular calcifications bilaterally.  Probable intracapsular rupture of the RIGHT breast prosthesis.  Small amounts of fluid are seen surrounding both prostheses greater on RIGHT.  Scattered atherosclerotic calcifications aorta and  coronary arteries.  No thoracic adenopathy.  Visualized upper abdomen unremarkable.  Increase in size of an irregular anterior RIGHT upper lobe nodule 14 x 12 x 10 mm near minor fissure, appears more solid than on previous exam.  This nodule measured 10 x 9 x 8 mm on 10/02/2012.  No additional mass, nodule, infiltrate, or pleural effusion.  No acute osseous findings.  Old LEFT rib fractures noted.  IMPRESSION: Interval increase in size of an anterior RIGHT upper lobe nodule near minor fissure concerning for tumor.  No other intra thoracic abnormalities identified.   Electronically Signed By: Lavonia Dana M.D. On: 03/14/2014 09:25      Nm Pet Image Restag (ps) Skull Base To Thigh  04/03/2014   CLINICAL DATA:  Subsequent treatment strategy for right breast cancer.  EXAM: NUCLEAR MEDICINE PET SKULL BASE TO THIGH  TECHNIQUE: 8.21 mCi F-18 FDG was injected intravenously. Full-ring PET imaging was performed from  the skull base to thigh after the radiotracer. CT data was obtained and used for attenuation correction and anatomic localization.  FASTING BLOOD GLUCOSE:  Value: 94 mg/dl  COMPARISON:  Chest CT 03/13/2014 and 03/04/2013.  FINDINGS: NECK  No hypermetabolic cervical lymph nodes are identified.There are no lesions of the pharyngeal mucosal space.  CHEST  There are no hypermetabolic mediastinal, hilar, axillary or internal mammary lymph nodes. The enlarging subpleural right upper lobe nodule is hypermetabolic. This lesion measures 1.4 x 1.3 cm on image 33 and has an SUV max of 3.5. This lesion abuts the minor fissure. No other hypermetabolic pulmonary nodules identified. Partially calcified bilateral breast implants are noted without abnormal metabolic activity. There is stable atherosclerosis of the aorta, great vessels and coronary arteries.  ABDOMEN/PELVIS  There is no hypermetabolic activity within the liver, adrenal glands, spleen or pancreas. There is no hypermetabolic nodal activity. There are probable bilateral renal cysts, devoid of metabolic activity. Previous partial hysterectomy. Both ovaries are visualized and appear normal. Mild aortoiliac atherosclerosis.  SKELETON  There is no hypermetabolic activity to suggest osseous metastatic disease. Old rib fractures noted on the left.  IMPRESSION: 1. The enlarging right upper lobe pulmonary nodule is hypermetabolic consistent with malignancy. Morphologic features favor primary bronchogenic carcinoma (likely adenocarcinoma) over metastatic disease. Tissue sampling recommended. 2. No evidence of chest wall recurrence or nodal metastases. 3. No evidence of distant metastatic disease.   Electronically Signed   By: Richardean Sale M.D.   On: 04/03/2014 12:13     I have independently reviewed the above radiologic studies.  Recent Lab Findings: Lab Results  Component Value Date   WBC 7.5 04/17/2014   HGB 13.4 04/17/2014   HCT 38.7 04/17/2014   PLT 232  04/17/2014   GLUCOSE 110* 04/17/2014   ALT 40* 04/17/2014   AST 53* 04/17/2014   NA 134* 04/17/2014   K 4.7 04/17/2014   CL 95* 04/17/2014   CREATININE 0.84 04/17/2014   BUN 14 04/17/2014   CO2 28 04/17/2014   INR 0.94 04/17/2014   PFT's done at [facility]: FEV1 1.91  85%  DLCO 20.0 56%   Assessment / Plan:    1/Enlarging right upper lobe pulmonary nodule is hypermetabolic consistent with malignancy. Morphologic features favor primary bronchogenic carcinoma (likely adenocarcinoma) over metastatic disease. Likely Stage 1 Lung cancer .I discussed in detail with the patient the options of primary surgical resection versus attempted needle biopsy. I recommended to her proceeding with surgical resection offers the best treatment option.   2/rupture of right breast implant on ct  scan   The goals risks and alternatives of the planned surgical procedure bronchoscopy, right VATS lung resection  have been discussed with the patient in detail. The risks of the procedure including death, infection, stroke, myocardial infarction, bleeding, blood transfusion have all been discussed specifically.  I have quoted Gabrielle [name] a 4 % of perioperative mortality and a complication rate as high as 25%. The patient's questions have been answered.Gabrielle [name] is willing  to proceed with the planned procedure.   Grace Isaac MD      [address] [place] [address] Office [phone]   Beeper [phone]  04/20/2014 7:18 AM

## 2014-04-20 NOTE — Anesthesia Postprocedure Evaluation (Signed)
Anesthesia Post Note  Patient: Gabrielle [name]  Procedure(s) Performed: Procedure(s) (LRB): VIDEO BRONCHOSCOPY (N/A) VIDEO ASSISTED THORACOSCOPY  (Right) WEDGE RESECTION, RIGHT UPPER LOBE (Right) LOBECTOMY, RIGHT UPPER LOBE WITH PLACEMENT OF ONQ PAIN PUMP (Right) NODE DISSECTION (Right)  Anesthesia type: General  Patient location: PACU  Post pain: Pain level controlled  Post assessment: Post-op Vital signs reviewed  Last Vitals: BP 105/73 mmHg  Pulse 106  Temp(Src) 36.9 C (Oral)  Resp 19  Ht 5\' 1"  (1.549 m)  Wt 163 lb (73.936 kg)  BMI 30.81 kg/m2  SpO2 98%  Post vital signs: Reviewed  Level of consciousness: sedated  Complications: No apparent anesthesia complications

## 2014-04-20 NOTE — Anesthesia Procedure Notes (Addendum)
Procedure Name: Intubation Date/Time: 04/20/2014 7:43 AM Performed by: Ollen Bowl Pre-anesthesia Checklist: Patient identified, Emergency Drugs available, Suction available, Patient being monitored and Timeout performed Patient Re-evaluated:Patient Re-evaluated prior to inductionOxygen Delivery Method: Circle system utilized and Simple face mask Preoxygenation: Pre-oxygenation with 100% oxygen Intubation Type: IV induction Ventilation: Mask ventilation without difficulty Laryngoscope Size: Mac and 3 Grade View: Grade II Tube type: Oral Tube size: 8.5 mm Number of attempts: 1 Airway Equipment and Method: Patient positioned with wedge pillow and Stylet Placement Confirmation: ETT inserted through vocal cords under direct vision,  positive ETCO2 and breath sounds checked- equal and bilateral Secured at: 21 cm Tube secured with: Tape Dental Injury: Teeth and Oropharynx as per pre-operative assessment    Procedure Name: Intubation Date/Time: 04/20/2014 8:02 AM Performed by: Raphael Gibney T Pre-anesthesia Checklist: Patient identified, Suction available, Emergency Drugs available, Patient being monitored and Timeout performed Patient Re-evaluated:Patient Re-evaluated prior to inductionOxygen Delivery Method: Circle system utilized and Simple face mask Preoxygenation: Pre-oxygenation with 100% oxygen Laryngoscope Size: Mac and 4 Grade View: Grade I Endobronchial tube: Left, Double lumen EBT, EBT position confirmed by fiberoptic bronchoscope and EBT position confirmed by auscultation and 37 Fr Number of attempts: 1 Airway Equipment and Method: Patient positioned with wedge pillow and Stylet Placement Confirmation: ETT inserted through vocal cords under direct vision,  positive ETCO2 and breath sounds checked- equal and bilateral Secured at: 23 cm Tube secured with: Tape Dental Injury: Teeth and Oropharynx as per pre-operative assessment

## 2014-04-21 ENCOUNTER — Encounter (HOSPITAL_COMMUNITY): Payer: Self-pay | Admitting: Cardiothoracic Surgery

## 2014-04-21 ENCOUNTER — Inpatient Hospital Stay (HOSPITAL_COMMUNITY): Payer: 59

## 2014-04-21 LAB — BASIC METABOLIC PANEL
Anion gap: 6 (ref 5–15)
BUN: <5 mg/dL — ABNORMAL LOW (ref 6–23)
CHLORIDE: 100 mmol/L (ref 96–112)
CO2: 27 mmol/L (ref 19–32)
Calcium: 7.9 mg/dL — ABNORMAL LOW (ref 8.4–10.5)
Creatinine, Ser: 0.67 mg/dL (ref 0.50–1.10)
GFR calc Af Amer: >90 mL/min (ref >90)
GFR calc non Af Amer: >90 mL/min (ref >90)
GLUCOSE: 120 mg/dL — AB (ref 70–99)
POTASSIUM: 3.6 mmol/L (ref 3.5–5.1)
Sodium: 133 mmol/L — ABNORMAL LOW (ref 135–145)

## 2014-04-21 LAB — CBC
HEMATOCRIT: 29.9 % — AB (ref 36.0–46.0)
Hemoglobin: 10.5 g/dL — ABNORMAL LOW (ref 12.0–15.0)
MCH: 33.7 pg (ref 26.0–34.0)
MCHC: 35.1 g/dL (ref 30.0–36.0)
MCV: 95.8 fL (ref 78.0–100.0)
PLATELETS: 170 10*3/uL (ref 150–400)
RBC: 3.12 MIL/uL — ABNORMAL LOW (ref 3.87–5.11)
RDW: 14 % (ref 11.5–15.5)
WBC: 10.9 10*3/uL — ABNORMAL HIGH (ref 4.0–10.5)

## 2014-04-21 LAB — POCT I-STAT 3, ART BLOOD GAS (G3+)
Acid-base deficit: 3 mmol/L — ABNORMAL HIGH (ref 0.0–2.0)
Bicarbonate: 22.8 mEq/L (ref 20.0–24.0)
O2 Saturation: 90 %
Patient temperature: 97.9
TCO2: 24 mmol/L (ref 0–100)
pCO2 arterial: 39.8 mmHg (ref 35.0–45.0)
pH, Arterial: 7.364 (ref 7.350–7.450)
pO2, Arterial: 58 mmHg — ABNORMAL LOW (ref 80.0–100.0)

## 2014-04-21 LAB — GLUCOSE, CAPILLARY: Glucose-Capillary: 154 mg/dL — ABNORMAL HIGH (ref 70–99)

## 2014-04-21 MED ORDER — ONDANSETRON HCL 4 MG/2ML IJ SOLN: 4.0000 mg | Freq: Four times a day (QID) | INTRAMUSCULAR | Status: DC | PRN

## 2014-04-21 MED ORDER — NICOTINE 21 MG/24HR TD PT24
21.0000 mg | MEDICATED_PATCH | Freq: Every day | TRANSDERMAL | Status: DC
  Administered 2014-04-22 – 2014-04-28 (×7): 21 mg via TRANSDERMAL
  Filled 2014-04-21 (×9): qty 1

## 2014-04-21 MED ORDER — HYDROMORPHONE 0.3 MG/ML IV SOLN
INTRAVENOUS | Status: DC
  Administered 2014-04-21: 18:00:00 via INTRAVENOUS
  Administered 2014-04-21: 1.7 mg via INTRAVENOUS
  Administered 2014-04-21: 1.8 mg via INTRAVENOUS
  Administered 2014-04-22: 0.3 mg via INTRAVENOUS
  Administered 2014-04-22: 1.2 mg via INTRAVENOUS
  Administered 2014-04-22: 1.69 mg via INTRAVENOUS
  Administered 2014-04-22: 1.2 mg via INTRAVENOUS
  Administered 2014-04-22: 20:00:00 via INTRAVENOUS
  Administered 2014-04-23: 0.3 mg via INTRAVENOUS
  Administered 2014-04-23: 1.2 mg via INTRAVENOUS
  Administered 2014-04-23: 0.6 mg via INTRAVENOUS
  Administered 2014-04-23: 0.9 mg via INTRAVENOUS
  Administered 2014-04-23: 1.2 mg via INTRAVENOUS
  Administered 2014-04-24: 1.29 mg via INTRAVENOUS
  Administered 2014-04-24: 0.9 mg via INTRAVENOUS
  Administered 2014-04-24: 17:00:00 via INTRAVENOUS
  Administered 2014-04-24 (×2): 0.3 mg via INTRAVENOUS
  Administered 2014-04-24: 0.9 mg via INTRAVENOUS
  Administered 2014-04-25: 0.6 mg via INTRAVENOUS
  Administered 2014-04-25: 0.9 mg via INTRAVENOUS
  Administered 2014-04-25: 0.3 mg via INTRAVENOUS
  Administered 2014-04-25: 0.6 mg via INTRAVENOUS
  Administered 2014-04-26 (×2): 0.3 mg via INTRAVENOUS
  Administered 2014-04-26: 0.6 mg via INTRAVENOUS
  Filled 2014-04-21 (×3): qty 25

## 2014-04-21 MED ORDER — NALOXONE HCL 0.4 MG/ML IJ SOLN: 0.4000 mg | INTRAMUSCULAR | Status: DC | PRN

## 2014-04-21 MED ORDER — TRAMADOL HCL 50 MG PO TABS
50.0000 mg | ORAL_TABLET | Freq: Three times a day (TID) | ORAL | Status: DC | PRN
  Administered 2014-04-21 – 2014-05-01 (×14): 50 mg via ORAL
  Filled 2014-04-21 (×14): qty 1

## 2014-04-21 MED ORDER — SODIUM CHLORIDE 0.9 % IJ SOLN: 9.0000 mL | INTRAMUSCULAR | Status: DC | PRN

## 2014-04-21 MED ORDER — DIPHENHYDRAMINE HCL 50 MG/ML IJ SOLN: 12.5000 mg | Freq: Four times a day (QID) | INTRAMUSCULAR | Status: DC | PRN

## 2014-04-21 MED ORDER — FUROSEMIDE 10 MG/ML IJ SOLN
40.0000 mg | Freq: Once | INTRAMUSCULAR | Status: AC
  Administered 2014-04-21: 40 mg via INTRAVENOUS

## 2014-04-21 MED ORDER — DIPHENHYDRAMINE HCL 12.5 MG/5ML PO ELIX
12.5000 mg | ORAL_SOLUTION | Freq: Four times a day (QID) | ORAL | Status: DC | PRN
  Filled 2014-04-21: qty 5

## 2014-04-21 MED ORDER — GUAIFENESIN ER 600 MG PO TB12
600.0000 mg | ORAL_TABLET | Freq: Two times a day (BID) | ORAL | Status: DC
  Administered 2014-04-21 – 2014-05-02 (×22): 600 mg via ORAL
  Filled 2014-04-21 (×24): qty 1

## 2014-04-21 NOTE — Progress Notes (Addendum)
Patient ID: Gabrielle [name], female   DOB: 06/23/1955, 59 y.o.   MRN: [id number] TCTS DAILY ICU PROGRESS NOTE                   [address]            [place] [address]          [phone]   1 Day Post-Op Procedure(s) (LRB): VIDEO BRONCHOSCOPY (N/A) VIDEO ASSISTED THORACOSCOPY  (Right) WEDGE RESECTION, RIGHT UPPER LOBE (Right) LOBECTOMY, RIGHT UPPER LOBE WITH PLACEMENT OF ONQ PAIN PUMP (Right) NODE DISSECTION (Right)  Total Length of Stay:  LOS: 1 day   Subjective: Up to chair ,   Objective: Vital signs in last 24 hours: Temp:  [96.8 F (36 C)-98.5 F (36.9 C)] 97.9 F (36.6 C) (03/29 0323) Pulse Rate:  [87-110] 105 (03/29 0700) Cardiac Rhythm:  [-] Normal sinus rhythm (03/29 0400) Resp:  [11-23] 22 (03/29 0700) BP: (86-118)/(51-87) 86/56 mmHg (03/29 0700) SpO2:  [93 %-100 %] 95 % (03/29 0700) Arterial Line BP: (49-160)/(40-70) 112/40 mmHg (03/29 0700) Weight:  [163 lb (73.936 kg)] 163 lb (73.936 kg) (03/28 1700)  Filed Weights   04/20/14 0556 04/20/14 1700  Weight: 163 lb (73.936 kg) 163 lb (73.936 kg)    Weight change: -0 oz (-0 kg)   Hemodynamic parameters for last 24 hours:    Intake/Output from previous day: 03/28 0701 - 03/29 0700 In: 4500 [I.V.:4400; IV Piggyback:100] Out: 4430 [Urine:3980; Blood:100; Chest Tube:350]  Intake/Output this shift:    Current Meds: Scheduled Meds: . acetaminophen  1,000 mg Oral 4 times per day   Or  . acetaminophen (TYLENOL) oral liquid 160 mg/5 mL  1,000 mg Oral 4 times per day  . albuterol  2.5 mg Nebulization Q4H while awake  . anastrozole  1 mg Oral Daily  . aspirin EC  81 mg Oral Daily  . bisacodyl  10 mg Oral Daily  . busPIRone  5 mg Oral TID  . cefUROXime (ZINACEF)  IV  1.5 g Intravenous Q12H  . enoxaparin (LOVENOX) injection  40 mg Subcutaneous Q24H  . fentaNYL   Intravenous 6 times per day  . gabapentin  800 mg Oral TID  . metoprolol  100 mg Oral BH-q7a  . metoprolol  50 mg Oral QHS    . pantoprazole (PROTONIX) IV  40 mg Intravenous Q12H  . pravastatin  40 mg Oral Daily  . senna-docusate  1 tablet Oral QHS  . traZODone  200 mg Oral QHS   Continuous Infusions: . bupivacaine 0.5 % ON-Q pump SINGLE CATH 400 mL    . dextrose 5 % and 0.9% NaCl 125 mL/hr (04/20/14 2342)   PRN Meds:.diphenhydrAMINE **OR** diphenhydrAMINE, naloxone **AND** sodium chloride, ondansetron (ZOFRAN) IV, potassium chloride  General appearance: alert, cooperative and no distress Neurologic: intact Heart: regular rate and rhythm, S1, S2 normal, no murmur, click, rub or gallop Lungs: diminished breath sounds bibasilar Abdomen: soft, non-tender; bowel sounds normal; no masses,  no organomegaly Extremities: extremities normal, atraumatic, no cyanosis or edema and Homans sign is negative, no sign of DVT Wound: small air leak  Lab Results: CBC: Recent Labs  04/21/14 0400  WBC 10.9*  HGB 10.5*  HCT 29.9*  PLT 170   BMET:  Recent Labs  04/21/14 0400  NA 133*  K 3.6  CL 100  CO2 27  GLUCOSE 120*  BUN <5*  CREATININE 0.67  CALCIUM 7.9*    PT/INR: No results for input(s): LABPROT,  INR in the last 72 hours. Radiology: Dg Chest Port 1 View  04/20/2014   CLINICAL DATA:  Post right upper lobectomy.  Lung cancer.  EXAM: PORTABLE CHEST - 1 VIEW  COMPARISON:  04/17/2014  FINDINGS: Postsurgical changes on the right. Two right chest tubes are in place. No pneumothorax. Right central line is in place with the tip in the SVC.  Left base atelectasis. Heart is upper limits normal in size. No effusions. No acute bony abnormality.  IMPRESSION: Postoperative changes on the right with 2 right chest tubes. No pneumothorax.  Left base atelectasis.   Electronically Signed   By: Rolm Baptise M.D.   On: 04/20/2014 13:29     Assessment/Plan: S/P Procedure(s) (LRB): VIDEO BRONCHOSCOPY (N/A) VIDEO ASSISTED THORACOSCOPY  (Right) WEDGE RESECTION, RIGHT UPPER LOBE (Right) LOBECTOMY, RIGHT UPPER LOBE WITH PLACEMENT  OF ONQ PAIN PUMP (Right) NODE DISSECTION (Right) Mobilize Diuresis d/c tubes/lines See progression orders Expected Acute  Blood - loss Anemia    Grace Isaac 04/21/2014 7:32 AM

## 2014-04-21 NOTE — Progress Notes (Signed)
Pt transferred to 3S rm 5 in bed. Vitals per flow sheet. Report given to [place] Ussery RN.

## 2014-04-22 ENCOUNTER — Inpatient Hospital Stay (HOSPITAL_COMMUNITY): Payer: 59

## 2014-04-22 LAB — COMPREHENSIVE METABOLIC PANEL
ALT: 21 U/L (ref 0–35)
AST: 64 U/L — ABNORMAL HIGH (ref 0–37)
Albumin: 2.8 g/dL — ABNORMAL LOW (ref 3.5–5.2)
Alkaline Phosphatase: 69 U/L (ref 39–117)
Anion gap: 5 (ref 5–15)
BILIRUBIN TOTAL: 0.8 mg/dL (ref 0.3–1.2)
BUN: 6 mg/dL (ref 6–23)
CO2: 30 mmol/L (ref 19–32)
Calcium: 7.9 mg/dL — ABNORMAL LOW (ref 8.4–10.5)
Chloride: 98 mmol/L (ref 96–112)
Creatinine, Ser: 1 mg/dL (ref 0.50–1.10)
GFR calc Af Amer: 72 mL/min — ABNORMAL LOW (ref >90)
GFR calc non Af Amer: 62 mL/min — ABNORMAL LOW (ref >90)
Glucose, Bld: 106 mg/dL — ABNORMAL HIGH (ref 70–99)
Potassium: 4.4 mmol/L (ref 3.5–5.1)
Sodium: 133 mmol/L — ABNORMAL LOW (ref 135–145)
Total Protein: 5.4 g/dL — ABNORMAL LOW (ref 6.0–8.3)

## 2014-04-22 LAB — CBC
HCT: 30.8 % — ABNORMAL LOW (ref 36.0–46.0)
Hemoglobin: 10.5 g/dL — ABNORMAL LOW (ref 12.0–15.0)
MCH: 33.5 pg (ref 26.0–34.0)
MCHC: 34.1 g/dL (ref 30.0–36.0)
MCV: 98.4 fL (ref 78.0–100.0)
PLATELETS: 158 10*3/uL (ref 150–400)
RBC: 3.13 MIL/uL — ABNORMAL LOW (ref 3.87–5.11)
RDW: 14.6 % (ref 11.5–15.5)
WBC: 11.2 10*3/uL — ABNORMAL HIGH (ref 4.0–10.5)

## 2014-04-22 MED ORDER — FUROSEMIDE 10 MG/ML IJ SOLN
40.0000 mg | Freq: Once | INTRAMUSCULAR | Status: AC
  Administered 2014-04-22: 40 mg via INTRAVENOUS
  Filled 2014-04-22: qty 4

## 2014-04-22 MED ORDER — LEVALBUTEROL HCL 0.63 MG/3ML IN NEBU
0.6300 mg | INHALATION_SOLUTION | Freq: Three times a day (TID) | RESPIRATORY_TRACT | Status: DC | PRN
  Filled 2014-04-22: qty 3

## 2014-04-22 MED ORDER — LEVALBUTEROL HCL 0.63 MG/3ML IN NEBU
0.6300 mg | INHALATION_SOLUTION | RESPIRATORY_TRACT | Status: AC
Start: 2014-04-22 — End: 2014-04-22
  Administered 2014-04-22: 0.63 mg via RESPIRATORY_TRACT
  Filled 2014-04-22: qty 3

## 2014-04-22 NOTE — Progress Notes (Addendum)
      [address]       [place] [address]             [phone]       2 Days Post-Op Procedure(s) (LRB): VIDEO BRONCHOSCOPY (N/A) VIDEO ASSISTED THORACOSCOPY  (Right) WEDGE RESECTION, RIGHT UPPER LOBE (Right) LOBECTOMY, RIGHT UPPER LOBE WITH PLACEMENT OF ONQ PAIN PUMP (Right) NODE DISSECTION (Right)  Subjective: Patient with cough-mostly non productive. Feels as though she might be aspirating liquids, but states she needs to be upright for feeding.  Objective: Vital signs in last 24 hours: Temp:  [98.3 F (36.8 C)-99.4 F (37.4 C)] 99.1 F (37.3 C) (03/30 0347) Pulse Rate:  [87-126] 97 (03/30 0746) Cardiac Rhythm:  [-] Normal sinus rhythm (03/30 0746) Resp:  [14-31] 18 (03/30 0746) BP: (80-146)/(52-123) 112/57 mmHg (03/30 0746) SpO2:  [86 %-100 %] 98 % (03/30 0746) FiO2 (%):  [50 %-100 %] 100 % (03/29 2357)     Intake/Output from previous day: 03/29 0701 - 03/30 0700 In: 2710 [P.O.:1320; I.V.:1290; IV Piggyback:100] Out: 9147 [Urine:3925; Chest Tube:290]   Physical Exam:  Cardiovascular: RRR Pulmonary: Expiratory wheezing, rub with chest tubes in place on the right Abdomen: Soft, non tender, bowel sounds present. Extremities: Trace bilateral lower extremity edema. Wounds: Dressing is clean and dry.   Chest Tubes: to water seal and tidling with cough  Lab Results: CBC: Recent Labs  04/21/14 0400 04/22/14 0403  WBC 10.9* 11.2*  HGB 10.5* 10.5*  HCT 29.9* 30.8*  PLT 170 158   BMET:  Recent Labs  04/21/14 0400 04/22/14 0403  NA 133* 133*  K 3.6 4.4  CL 100 98  CO2 27 30  GLUCOSE 120* 106*  BUN <5* 6  CREATININE 0.67 1.00  CALCIUM 7.9* 7.9*    PT/INR: No results for input(s): LABPROT, INR in the last 72 hours. ABG:  INR: Will add last result for INR, ABG once components are confirmed Will add last 4 CBG results once components are confirmed  Assessment/Plan:  1. CV - SR in the low 90's. At times, tachy in the 100's. On  Lopressor 100 mg each am and 50 mg at hs. 2.  Pulmonary - On partial NRB . Chest tubes with 100 cc last 12 hours. Chest tubes are to water seal. There is tidiling with cough. CXR shows no pneumothorax and diffuse interstitial changes. Hope to remove one chest tube soon.Encourage incentive spirometer and flutter valve. Xopenex PRN wheezing. Pathology results are pending. 3. ABL anemia-H and H stable at 10.5 and 30.8 4. Patient to be upright for all meals. Monitor closely. Will order speech path eval if symptoms continue  ZIMMERMAN,DONIELLE MPA-C 04/22/2014,8:01 AM  Will diuresis further Would on pulm toilet I have seen and examined Gabrielle [name] and agree with the above assessment  and plan.  Grace Isaac MD Beeper [phone] Office [phone] 04/22/2014 8:43 AM

## 2014-04-23 ENCOUNTER — Inpatient Hospital Stay (HOSPITAL_COMMUNITY): Payer: 59

## 2014-04-23 DIAGNOSIS — R918 Other nonspecific abnormal finding of lung field: Secondary | ICD-10-CM

## 2014-04-23 DIAGNOSIS — J9601 Acute respiratory failure with hypoxia: Secondary | ICD-10-CM

## 2014-04-23 DIAGNOSIS — Z720 Tobacco use: Secondary | ICD-10-CM

## 2014-04-23 LAB — CBC
HCT: 26.8 % — ABNORMAL LOW (ref 36.0–46.0)
Hemoglobin: 9 g/dL — ABNORMAL LOW (ref 12.0–15.0)
MCH: 32.8 pg (ref 26.0–34.0)
MCHC: 33.6 g/dL (ref 30.0–36.0)
MCV: 97.8 fL (ref 78.0–100.0)
Platelets: 144 10*3/uL — ABNORMAL LOW (ref 150–400)
RBC: 2.74 MIL/uL — ABNORMAL LOW (ref 3.87–5.11)
RDW: 14.1 % (ref 11.5–15.5)
WBC: 9 10*3/uL (ref 4.0–10.5)

## 2014-04-23 LAB — BASIC METABOLIC PANEL
Anion gap: 6 (ref 5–15)
BUN: 6 mg/dL (ref 6–23)
CO2: 29 mmol/L (ref 19–32)
Calcium: 8 mg/dL — ABNORMAL LOW (ref 8.4–10.5)
Chloride: 98 mmol/L (ref 96–112)
Creatinine, Ser: 0.72 mg/dL (ref 0.50–1.10)
GFR calc Af Amer: >90 mL/min (ref >90)
GFR calc non Af Amer: >90 mL/min (ref >90)
Glucose, Bld: 109 mg/dL — ABNORMAL HIGH (ref 70–99)
Potassium: 4.1 mmol/L (ref 3.5–5.1)
Sodium: 133 mmol/L — ABNORMAL LOW (ref 135–145)

## 2014-04-23 LAB — SEDIMENTATION RATE: Sed Rate: 125 mm/hr — ABNORMAL HIGH (ref 0–22)

## 2014-04-23 LAB — BRAIN NATRIURETIC PEPTIDE: B NATRIURETIC PEPTIDE 5: 194.6 pg/mL — AB (ref 0.0–100.0)

## 2014-04-23 MED ORDER — ALBUTEROL SULFATE (2.5 MG/3ML) 0.083% IN NEBU
2.5000 mg | INHALATION_SOLUTION | Freq: Three times a day (TID) | RESPIRATORY_TRACT | Status: DC
  Administered 2014-04-24 – 2014-04-30 (×20): 2.5 mg via RESPIRATORY_TRACT
  Filled 2014-04-23 (×22): qty 3

## 2014-04-23 MED ORDER — PANTOPRAZOLE SODIUM 40 MG PO TBEC
40.0000 mg | DELAYED_RELEASE_TABLET | Freq: Two times a day (BID) | ORAL | Status: DC
  Administered 2014-04-23 – 2014-05-02 (×16): 40 mg via ORAL
  Filled 2014-04-23 (×15): qty 1

## 2014-04-23 MED ORDER — METHYLPREDNISOLONE SODIUM SUCC 125 MG IJ SOLR
60.0000 mg | Freq: Four times a day (QID) | INTRAMUSCULAR | Status: DC
  Administered 2014-04-23 – 2014-04-27 (×16): 60 mg via INTRAVENOUS
  Filled 2014-04-23 (×2): qty 0.96
  Filled 2014-04-23: qty 2
  Filled 2014-04-23 (×5): qty 0.96
  Filled 2014-04-23 (×2): qty 2
  Filled 2014-04-23 (×3): qty 0.96
  Filled 2014-04-23 (×3): qty 2
  Filled 2014-04-23 (×6): qty 0.96

## 2014-04-23 MED ORDER — FUROSEMIDE 10 MG/ML IJ SOLN
40.0000 mg | Freq: Once | INTRAMUSCULAR | Status: AC
  Administered 2014-04-23: 40 mg via INTRAVENOUS
  Filled 2014-04-23: qty 4

## 2014-04-23 NOTE — Consult Note (Addendum)
Name: Gabrielle [name] MRN: [id number] DOB: [birth year]    ADMISSION DATE:  04/20/2014 CONSULTATION DATE:  04/23/2014  REFERRING MD :  Dr. Servando Snare  CHIEF COMPLAINT:  Short of breath  BRIEF PATIENT DESCRIPTION:  59 yo female smoker with RUL nodule and hx of breast cancer had Rt upper lobectomy on 04/20/14.  Developed progressive dyspnea, hypoxia, and progressive pulmonary infiltrates post-op and PCCM consulted.  SIGNIFICANT EVENTS  3/28 Admit, Rt upper lobectomy 3/31 Start trial of steroids  STUDIES:  PET scan 05/02/79 >> hypermetabolic RUL nodule 1.4 x 1.3 cm PFT 04/07/14 >> FEV1 1.91 (85%), FEV1% 73, TLC 4.22 (98%), DLCO 56%  LINES/TUBES: Rt IJ CVL 3/28 >> Rt chest tube 3/28 >>   HISTORY OF PRESENT ILLNESS:   59 yo female smoker with hx of breast cancer had enlarging Rt upper lung nodule.  This was PET positive and found to be adenocarcinoma.  She had Rt upper lobectomy on 3/28.  After surgery she notes having coughing spells while drinking and feels like some stuff went into her lungs, but not a lot.  She has cough, but not much sputum.  She has discomfort at surgical site, but otherwise denies chest pressure or palpitations.  She has not had fever, but has been on scheduled tylenol after surgery.  WBC count has been trending down.  She did not have symptoms of respiratory infection prior to having surgery.  There is no hx of lupus or rheumatoid arthritis.  She denies cardiac history.  She has received lasix over the past several days >> good diuresis, but no change in oxygen needs or chest xray appearance.  She denies abdominal pain, nausea, skin rash, joint swelling, or leg swelling.  PAST MEDICAL HISTORY :   has a past medical history of Hyperlipidemia; Carpal tunnel syndrome; Chronic pain in left foot; GERD (gastroesophageal reflux disease); History of anemia; H/O eating disorder; Bipolar depression; Breast cancer (04/2012); Invasive ductal carcinoma of right breast (08/21/2012);  Tobacco abuse (08/21/2012); S/P bilateral breast implants (08/21/2012); Diarrhea; Cough; Pneumonia; Complication of anesthesia; and Orthostatic hypotension.  has past surgical history that includes Breast lumpectomy (Left, 1990); Breast lumpectomy with sentinel lymph node bx (Right, 04/2012); Abdominal hysterectomy; Tonsillectomy; Video bronchoscopy (N/A, 04/20/2014); Video assisted thoracoscopy (vats)/wedge resection (Right, 04/20/2014); Wedge resection (Right, 04/20/2014); Lobectomy (Right, 04/20/2014); and Node dissection (Right, 04/20/2014). Prior to Admission medications   Medication Sig Start Date End Date Taking? Authorizing Provider  acetaminophen (TYLENOL) 500 MG tablet Take 1,000 mg by mouth 2 (two) times daily. Takes 2 bid   Yes Historical Provider, MD  anastrozole (ARIMIDEX) 1 MG tablet Take 1 tablet (1 mg total) by mouth daily. 11/13/12  Yes Baird Cancer, PA-C  aspirin 81 MG tablet Take 81 mg by mouth daily.   Yes Historical Provider, MD  busPIRone (BUSPAR) 5 MG tablet Take 5 mg by mouth 3 (three) times daily.   Yes Historical Provider, MD  Calcium Citrate-Vitamin D (CALCIUM CITRATE + D3 PO) Take 1 capsule by mouth 2 (two) times daily. Calcium 659m & D3 500 IU   Yes Historical Provider, MD  gabapentin (NEURONTIN) 800 MG tablet Take 800 mg by mouth 3 (three) times daily.    Yes Historical Provider, MD  ibuprofen (ADVIL,MOTRIN) 800 MG tablet Take 800 mg by mouth daily. Takes 1 - 2 daily   Yes Historical Provider, MD  metoprolol (LOPRESSOR) 100 MG tablet Take 100 mg by mouth every morning.   Yes Historical Provider, MD  metoprolol (LOPRESSOR) 50 MG  tablet Take 50 mg by mouth at bedtime.   Yes Historical Provider, MD  omeprazole (PRILOSEC) 20 MG capsule Take 20 mg by mouth daily.   Yes Historical Provider, MD  pravastatin (PRAVACHOL) 40 MG tablet Take 40 mg by mouth daily.   Yes Historical Provider, MD  traZODone (DESYREL) 100 MG tablet Take 200 mg by mouth at bedtime. Takes 2 at bedtime   Yes  Historical Provider, MD  bismuth subsalicylate (PEPTO BISMOL) 262 MG chewable tablet Chew 524 mg by mouth as needed for indigestion.    Historical Provider, MD   Allergies  Allergen Reactions  . Hydrocodone     Other reaction(s): Other Makes her hyper  . Metronidazole Nausea Only    Makes pt feel sick    FAMILY HISTORY:  family history includes Hypertension in her father and mother; Leukemia in her maternal grandmother; Lymphoma (age of onset: 52) in her maternal aunt. SOCIAL HISTORY:  reports that she has been smoking Cigarettes.  She has a 40 pack-year smoking history. She has never used smokeless tobacco. She reports that she drinks about 12.0 oz of alcohol per week. She reports that she does not use illicit drugs.  REVIEW OF SYSTEMS:   Negative except above  SUBJECTIVE:   VITAL SIGNS: Temp:  [98.1 F (36.7 C)-99.1 F (37.3 C)] 99 F (37.2 C) (03/31 0752) Pulse Rate:  [102-130] 116 (03/31 0937) Resp:  [12-29] 21 (03/31 0754) BP: (90-129)/(49-72) 123/59 mmHg (03/31 0937) SpO2:  [90 %-99 %] 90 % (03/31 0754) FiO2 (%):  [100 %] 100 % (03/31 0752)  PHYSICAL EXAMINATION: General: sitting in chair Neuro:  Alert, normal strength, CN intact HEENT:  Wears glasses, NRB with nasal cannula Cardiovascular:  Regular, tachycardic Lungs:  B/l crackles, Rt chest tube with air leak Abdomen:  Soft, non tender Musculoskeletal:  No edema, no joint swelling Skin:  No rashes   CBC Recent Labs     04/21/14  0400  04/22/14  0403  04/23/14  0540  WBC  10.9*  11.2*  9.0  HGB  10.5*  10.5*  9.0*  HCT  29.9*  30.8*  26.8*  PLT  170  158  144*    BMET Recent Labs     04/21/14  0400  04/22/14  0403  04/23/14  0540  NA  133*  133*  133*  K  3.6  4.4  4.1  CL  100  98  98  CO2  _0 BUN  <5*  6  6  CREATININE  0.67  1.00  0.72  GLUCOSE  120*  106*  109*    Electrolytes Recent Labs     04/21/14  0400  04/22/14  0403  04/23/14  0540  CALCIUM  7.9*  7.9*  8.0*     ABG Recent Labs     04/21/14  0411  PHART  7.364  PCO2ART  39.8  PO2ART  58.0*    Liver Enzymes Recent Labs     04/22/14  0403  AST  64*  ALT  21  ALKPHOS  69  BILITOT  0.8  ALBUMIN  2.8*    Glucose Recent Labs     04/21/14  1612  GLUCAP  154*    Imaging Dg Chest Port 1 View  04/23/2014   CLINICAL DATA:  Known lung cancer, status post right lobectomy  EXAM: PORTABLE CHEST - 1 VIEW  COMPARISON:  04/22/2014  FINDINGS: Cardiac shadow is stable. Diffuse bilateral interstitial  changes are again identified and stable from the previous day. Thoracostomy catheters are again noted on the right as is a right central venous line. No definitive pneumothorax is identified. The patient is somewhat rotated to the right.  IMPRESSION: No change from the prior exam.   Electronically Signed   By: Inez Catalina M.D.   On: 04/23/2014 08:02   Dg Chest Port 1 View  04/22/2014   CLINICAL DATA:  Status post right lobectomy  EXAM: PORTABLE CHEST - 1 VIEW  COMPARISON:  04/21/2014  FINDINGS: Right chest tubes are again identified and stable. The previously seen pneumothorax has resolved in the interval. A right jugular central line is again seen and stable. The cardiac shadow is mildly enlarged. Diffuse interstitial changes are identified bilaterally stable from the previous day  IMPRESSION: Resolution of previously seen right-sided pneumothorax  The remainder of the exam is stable.   Electronically Signed   By: Inez Catalina M.D.   On: 04/22/2014 07:55    DISCUSSION: She has progressive pulmonary infiltrates after surgery.  Differential includes infection, aspiration, inflammatory process, or pulmonary edema.  Nothing to suggest infection and no improvement with aggressive diuresis.  That leaves aspiration pneumonitis or acute inflammatory process.  ASSESSMENT / PLAN:  Acute hypoxic respiratory failure with b/l pulmonary infiltrates. Plan: - check BNP, ESR - add solumedrol 3/31 - adjust oxygen  to keep SpO2 > 92% - f/u CXR - defer Abx for now - continue diuresis as tolerated per primary team  NSCLC (adenocarcinoma) s/p Rt upper lobectomy 04/20/14. Plan: - post op care, chest tube per primary team  Tobacco abuse. Plan: - smoking cessation  Chesley Mires, MD [place] 04/23/2014, 12:01 PM Pager:  [phone] After 3pm call: [phone]

## 2014-04-23 NOTE — Progress Notes (Addendum)
Gabrielle [name] [address]       Gabrielle [name],Gabrielle [name] [id number]             [phone]          3 Days Post-Op Procedure(s) (LRB): VIDEO BRONCHOSCOPY (N/A) VIDEO ASSISTED THORACOSCOPY  (Right) WEDGE RESECTION, RIGHT UPPER LOBE (Right) LOBECTOMY, RIGHT UPPER LOBE WITH PLACEMENT OF ONQ PAIN PUMP (Right) NODE DISSECTION (Right)  Subjective: OOB in chair.  Still desats requiring NRB to keep sats up.  Having a hard time coughing and using IS.  Walked in hall yesterday. Pain controlled and not really using PCA.   Objective: Vital signs in last 24 hours: Patient Vitals for the past 24 hrs:  BP Temp Temp src Pulse Resp SpO2  04/23/14 0720 - - - - - 99 %  04/23/14 0534 - - - - 15 99 %  04/23/14 0347 (!) 123/57 mmHg - - (!) 105 (!) 22 98 %  04/23/14 0343 - 99.1 F (37.3 C) Axillary - - -  04/23/14 0029 - - - - (!) 27 97 %  04/22/14 2311 91/72 mmHg - - (!) 102 (!) 28 97 %  04/22/14 2309 - 98.6 F (37 C) Axillary - - -  04/22/14 2120 129/64 mmHg - - (!) 125 - -  04/22/14 2000 - - - - 20 92 %  04/22/14 1957 - - - - - 95 %  04/22/14 1935 - 98.1 F (36.7 C) Axillary - - -  04/22/14 1920 (!) 108/49 mmHg - - (!) 130 12 90 %  04/22/14 1619 - - - - - 92 %  04/22/14 1534 - - - - (!) 28 92 %  04/22/14 1530 90/61 mmHg 98.9 F (37.2 C) Axillary (!) 104 (!) 29 91 %  04/22/14 1222 (!) 102/58 mmHg 98.9 F (37.2 C) Axillary (!) 102 (!) 21 91 %  04/22/14 1140 - - - - - 94 %  04/22/14 1131 - - - - (!) 28 99 %  04/22/14 1047 - - - - - 94 %  04/22/14 1038 - - - - - (!) 85 %  04/22/14 0800 - - - - 20 99 %  04/22/14 0746 (!) 112/57 mmHg 98.6 F (37 C) Axillary 97 18 98 %   Current Weight  04/20/14 163 lb (73.936 kg)     Intake/Output from previous day: 03/30 0701 - 03/31 0700 In: 1920 [P.O.:1920] Out: 1490 [Urine:1250; Chest Tube:240]    PHYSICAL EXAM:  Heart: Mildly tachy 110s Lungs: Coarse rhonchi bilaterally Wound: Clean and dry Chest tube: No air leak noted with  cough    Lab Results: CBC: Recent Labs  04/22/14 0403 04/23/14 0540  WBC 11.2* 9.0  HGB 10.5* 9.0*  HCT 30.8* 26.8*  PLT 158 144*   BMET:  Recent Labs  04/22/14 0403 04/23/14 0540  NA 133* 133*  K 4.4 4.1  CL 98 98  CO2 30 29  GLUCOSE 106* 109*  BUN 6 6  CREATININE 1.00 0.72  CALCIUM 7.9* 8.0*    PT/INR: No results for input(s): LABPROT, INR in the last 72 hours.  Path: INVASIVE WELL TO MODERATELY DIFFERENTIATED ADENOCARCINOMA, LEPIDIC PREDOMINANT PATTERN, SPANNING 2.2 CM IN GREATEST DIMENSION. - INCIDENTAL CARCINOID TUMORLET (LESS THAN 0.2 CM). - T1b, N0  Assessment/Plan: S/P Procedure(s) (LRB): VIDEO BRONCHOSCOPY (N/A) VIDEO ASSISTED THORACOSCOPY  (Right) WEDGE RESECTION, RIGHT UPPER LOBE (Right) LOBECTOMY, RIGHT UPPER LOBE WITH PLACEMENT OF ONQ PAIN PUMP (Right) NODE DISSECTION (Right)  CT with  no obvious air leak. Output decreasing, about 100 ml/past shift. Possibly can d/c 1 CT soon, will  place CTs to water seal.  CV- mildly tachy, SBPs low normal.  Back on home dose Lopressor (100 mg qam, 50 mg qpm).  Will continue to watch.  Pulm- continues to desat requiring NRB.  Continue IS, nebs, Mucinex, will add flutter valve, wean O2 as able.  Not really using PCA.  Will hopefully be able to d/c soon.  Continue ambulation, pulm toilet.    LOS: 3 days    Gabrielle [name],Gabrielle [name] 04/23/2014  Very small air leak will leave chest tubes for today /dc/ pca  I have seen and examined Gabrielle [name] and agree with the above assessment  and plan.  Grace Isaac MD Beeper [phone] Office [phone] 04/23/2014 11:02 AM

## 2014-04-24 ENCOUNTER — Inpatient Hospital Stay (HOSPITAL_COMMUNITY): Payer: 59

## 2014-04-24 DIAGNOSIS — C3410 Malignant neoplasm of upper lobe, unspecified bronchus or lung: Secondary | ICD-10-CM | POA: Diagnosis present

## 2014-04-24 DIAGNOSIS — Z9889 Other specified postprocedural states: Secondary | ICD-10-CM

## 2014-04-24 NOTE — Discharge Summary (Signed)
Physician Discharge Summary  Patient ID: Gabrielle [name] MRN: [id number] DOB/AGE: December 11, 1955 59 y.o.  Admit date: 04/20/2014 Discharge date: 05/02/2014  Admission Diagnoses:  Patient Active Problem List   Diagnosis Date Noted  . Hypercholesteremia 04/06/2014  . Cigarette smoker 02/25/2014  . Bilateral leg pain 10/14/2013  . Claudication 08/20/2013  . Breast CA 02/17/2013  . Invasive ductal carcinoma of right breast 08/21/2012  . Tobacco abuse 08/21/2012  . S/P bilateral breast implants 08/21/2012  . HYPERLIPIDEMIA 04/29/2008  . HYPERTENSION 04/29/2008  . GERD 04/29/2008   Discharge Diagnoses:   Patient Active Problem List   Diagnosis Date Noted  . Acute postoperative respiratory failure 04/27/2014  . Opacity of lung on imaging study   . Lung cancer, right upper lobe 04/24/2014  . S/P lobectomy of lung 04/20/2014  . Hypercholesteremia 04/06/2014  . Cigarette smoker 02/25/2014  . Bilateral leg pain 10/14/2013  . Claudication 08/20/2013  . Breast CA 02/17/2013  . Invasive ductal carcinoma of right breast 08/21/2012  . Tobacco abuse 08/21/2012  . S/P bilateral breast implants 08/21/2012  . HYPERLIPIDEMIA 04/29/2008  . HYPERTENSION 04/29/2008  . GERD 04/29/2008   Discharged Condition: good  History of Present Illness:  Gabrielle [name] is a 59 yo female referred to Dr. Servando Snare for a Right Upper Lobe Lung Nodule.  She has a known previous history of Stage 1 breast cancer, grade 1 of the right breast ER positive greater than 90%, PR positive greater than 90%, HER-2/neu negative. She had surgery on May 03, 2012 followed by radiation therapy which was completed on 08/15/2012.  Follow up CT scan of the chest revealed a 10 mm right upper lobe lung nodule.  This was initially followed with serial CT scans, however most recent showed the nodule had grown in size and became more solid in appearance.  PET CT scan was done and showed the lesion to be hypermetabolic and was  suspicious for primary bronchogenic carcinoma.  She was evaluated by Dr. Servando Snare on 04/03/2014 at which time the patient continued to smoke.  She had tried Chantix without success.  At that time he felt surgical resection would be indicated.  Her PFTs were felt to be satisfactory for a lobectomy should that be indicated.  The risks and benefits of the procedure were explained to the patient and she was agreeable to proceed.  Hospital Course:   Gabrielle [name] presented to [facility] on 04/20/2014.  She was taken to the operating room and underwent Video Bronchoscopy, Right VATS, Wedge resection right upper lobe with completion lobectomy of right upper lobe and lymph node dissection.  She tolerated the procedure without difficulty, was extubated and taken to the PACU in stable condition.  The patient remained in the SICU overnight.  Her chest tubes did not show evidence of an air leak.  She was medically stable for transfer to the step down unit in stable condition on POD #2.  The patient was diuresed for hypervolemia.  The patient felt like she was aspirating liquids and required to remain upright for feedings.  This has resolved and the patient is eating without difficulty.  Her chest tubes continue to be free from air leak.  They have been weaned off suction and were removed without difficulty once drainage had decreased.  Her post operative CXR showed progression of pulmonary infiltrate.  Pulmonary consult was obtained and recommended a trial of steroids, but no ABX were indicated at that time.  She continued to have issues with desaturation with  ambulation.  Home oxygen was arranged.  She will require close Pulmonary follow up on outpatient basis.  Her pain is well controlled.  She is ambulating with minimal difficulty. Chest x ray today showed a decrease in the right apical pneumothorax. Pulmonary has put on Lasix 40 mg daily as well as a Prednisone taper. She has little drainage from chest tube  site. She is felt surgically stable for discharge home with home oxygen today.  Significant Diagnostic Studies:   Pathology:  1. Lung, wedge biopsy/resection, right upper lobe - INVASIVE WELL TO MODERATELY DIFFERENTIATED ADENOCARCINOMA, LEPIDIC PREDOMINANT PATTERN, SPANNING 2.2 CM IN GREATEST DIMENSION. - INCIDENTAL CARCINOID TUMORLET (LESS THAN 0.2 CM). - PLEASE SEE SPECIMEN #2 FOR FINAL MARGIN STATUS. - SEE ONCOLOGY TEMPLATE. 2. Lung, resection (segmental or lobe), right upper lobe - BENIGN LUNG PARENCHYMA WITH INCREASED PULMONARY MACROPHAGES. - FINAL BRONCHIAL AND VASCULAR MARGINS ARE NEGATIVE. - THREE BENIGN LYMPH NODES WITH NO TUMOR SEEN (0/3). 3. Lymph node, biopsy, 8 - ONE BENIGN LYMPH NODE WITH NO TUMOR SEEN (0/1). 4. Lymph node, biopsy, 10 R - ONE BENIGN LYMPH NODE WITH NO TUMOR SEEN (0/1). 5. Lymph node, biopsy, 11 R - ONE BENIGN LYMPH NODE WITH NO TUMOR SEEN (0/1). 6. Lymph node, biopsy, 12 R - ONE BENIGN LYMPH NODE WITH NO TUMOR SEEN (0/1). 7. Lymph node, biopsy, 4 R - ONE BENIGN  Treatments: surgery:   VIDEO BRONCHOSCOPY VIDEO ASSISTED THORACOSCOPY  WEDGE RESECTION, RIGHT UPPER LOBE COMPLETION LOBECTOMY, RIGHT UPPER LOBE  PLACEMENT OF ONQ PAIN PUMP LYMPH NODE DISSECTION  Disposition: Home  Discharge Medications:   Medication List    TAKE these medications        anastrozole 1 MG tablet  Commonly known as:  ARIMIDEX  Take 1 tablet (1 mg total) by mouth daily.     aspirin 81 MG tablet  Take 81 mg by mouth daily.     bismuth subsalicylate 161 MG chewable tablet  Commonly known as:  PEPTO BISMOL  Chew 524 mg by mouth as needed for indigestion.     busPIRone 5 MG tablet  Commonly known as:  BUSPAR  Take 5 mg by mouth 3 (three) times daily.     CALCIUM CITRATE + D3 PO  Take 1 capsule by mouth 2 (two) times daily. Calcium 675m & D3 500 IU     furosemide 40 MG tablet  Commonly known as:  LASIX  Take 1 tablet (40 mg total) by mouth daily. For 5  days then stop.     gabapentin 800 MG tablet  Commonly known as:  NEURONTIN  Take 800 mg by mouth 3 (three) times daily.     ibuprofen 800 MG tablet  Commonly known as:  ADVIL,MOTRIN  Take 800 mg by mouth daily. Takes 1 - 2 daily     metoprolol 100 MG tablet  Commonly known as:  LOPRESSOR  Take 100 mg by mouth every morning.     metoprolol 50 MG tablet  Commonly known as:  LOPRESSOR  Take 50 mg by mouth at bedtime.     nicotine 14 mg/24hr patch  Commonly known as:  NICODERM CQ - dosed in mg/24 hours  Place 1 patch (14 mg total) onto the skin daily.     omeprazole 20 MG capsule  Commonly known as:  PRILOSEC  Take 20 mg by mouth daily.     oxyCODONE 5 MG immediate release tablet  Commonly known as:  Oxy IR/ROXICODONE  Take 1-2 tablets (5-10 mg total)  by mouth every 4 (four) hours as needed for severe pain.     Potassium Chloride ER 20 MEQ Tbcr  Take 20 mEq by mouth daily. For 5 days then stop.     pravastatin 40 MG tablet  Commonly known as:  PRAVACHOL  Take 40 mg by mouth daily.     predniSONE 10 MG tablet  Commonly known as:  DELTASONE  Prednisone 30 mg by mouth daily for two days;Prednisone 20 mg by mouth daily for 3 days;Prednisone 10 mg by mouth daily for 3 days;Prednisone 5 mg by mouth daily for 3 days then stop.     traZODone 100 MG tablet  Commonly known as:  DESYREL  Take 200 mg by mouth at bedtime. Takes 2 at bedtime     TYLENOL 500 MG tablet  Generic drug:  acetaminophen  Take 1,000 mg by mouth 2 (two) times daily. Takes 2 bid        Follow-up Information    Follow up with Grace Isaac, MD On 05/21/2014.   Specialty:  Cardiothoracic Surgery   Why:  Appointment is at 9:30   Contact information:   [address] [id number] [phone]       Follow up with [facility] On 05/21/2014.   Why:  Please get CXR at 9:00   Contact information:   [facility]       Signed: Nani Skillern 05/02/2014, 10:21  AM

## 2014-04-24 NOTE — Progress Notes (Signed)
Name: Gabrielle [name] MRN: [id number] DOB: 1955/01/26    ADMISSION DATE:  04/20/2014 CONSULTATION DATE:  04/23/2014  REFERRING MD :  Dr. Servando Snare  CHIEF COMPLAINT:  Short of breath  BRIEF PATIENT DESCRIPTION:  59 yo female smoker with RUL nodule and hx of breast cancer had Rt upper lobectomy on 04/20/14.  Developed progressive dyspnea, hypoxia, and progressive pulmonary infiltrates post-op and PCCM consulted.  SIGNIFICANT EVENTS  3/28 Admit, Rt upper lobectomy 3/31 Start trial of steroids  STUDIES:  PET scan 7/35/32 >> hypermetabolic RUL nodule 1.4 x 1.3 cm PFT 04/07/14 >> FEV1 1.91 (85%), FEV1% 73, TLC 4.22 (98%), DLCO 56%  LINES/TUBES: Rt IJ CVL 3/28 >> Rt chest tube 3/28 >>    SUBJECTIVE:  Pt doing better, more productive cough. Relates GERD symptoms preop and ongoing tobacco use preop  VITAL SIGNS: Temp:  [98.1 F (36.7 C)-98.8 F (37.1 C)] 98.2 F (36.8 C) (04/01 0743) Pulse Rate:  [83-112] 90 (04/01 1116) Resp:  [13-28] 24 (04/01 1200) BP: (98-163)/(59-79) 121/64 mmHg (04/01 0906) SpO2:  [92 %-100 %] 100 % (04/01 1200) FiO2 (%):  [100 %] 100 % (04/01 0809)  PHYSICAL EXAMINATION: General: sitting in chair Neuro:  Alert, normal strength, CN intact HEENT:  Wears glasses, NRB with nasal cannula Cardiovascular:  Regular,RRR Lungs:  B/l crackles, Rt chest tube with air leak, improved BS  Abdomen:  Soft, non tender Musculoskeletal:  No edema, no joint swelling Skin:  No rashes   CBC Recent Labs     04/22/14  0403  04/23/14  0540  WBC  11.2*  9.0  HGB  10.5*  9.0*  HCT  30.8*  26.8*  PLT  158  144*    BMET Recent Labs     04/22/14  0403  04/23/14  0540  NA  133*  133*  K  4.4  4.1  CL  98  98  CO2  30  29  BUN  6  6  CREATININE  1.00  0.72  GLUCOSE  106*  109*    Electrolytes Recent Labs     04/22/14  0403  04/23/14  0540  CALCIUM  7.9*  8.0*    ABG No results for input(s): PHART, PCO2ART, PO2ART in the last 72 hours.  Liver  Enzymes Recent Labs     04/22/14  0403  AST  64*  ALT  21  ALKPHOS  69  BILITOT  0.8  ALBUMIN  2.8*    Glucose Recent Labs     04/21/14  1612  GLUCAP  154*    Imaging Dg Chest Port 1 View  04/24/2014   CLINICAL DATA:  Lung malignancy status post wedge resection/ lobectomy on April 20, 2014  EXAM: PORTABLE CHEST - 1 VIEW  COMPARISON:  Portable chest x-ray of April 23, 2014  FINDINGS: The lungs are reasonably well inflated. Diffuse alveolar opacities persist throughout the left lung and to a lesser extent on the right. There is mild shift of mediastinum toward the right consistent with the previous right lobectomy. The right-sided upper chest tube tip lies in the pulmonary apex. The lower chest tube tip projects along the mid to upper lateral thoracic wall. There is no pneumothorax. The cardiac silhouette remains enlarged. The internal jugular venous catheter tip projects over the distal third of the SVC.  IMPRESSION: Slight interval improvement in the appearance of the bilateral diffuse airspace opacities consistent with re- solving pulmonary edema and/or pneumonia. There is no pneumothorax. The support tubes  and lines are in appropriate position radiographically.   Electronically Signed   By: David  Martinique   On: 04/24/2014 07:53   Dg Chest Port 1 View  04/23/2014   CLINICAL DATA:  Known lung cancer, status post right lobectomy  EXAM: PORTABLE CHEST - 1 VIEW  COMPARISON:  04/22/2014  FINDINGS: Cardiac shadow is stable. Diffuse bilateral interstitial changes are again identified and stable from the previous day. Thoracostomy catheters are again noted on the right as is a right central venous line. No definitive pneumothorax is identified. The patient is somewhat rotated to the right.  IMPRESSION: No change from the prior exam.   Electronically Signed   By: Inez Catalina M.D.   On: 04/23/2014 08:02    DISCUSSION: She has progressive pulmonary infiltrates after surgery.  Differential includes  infection, aspiration, inflammatory process, or pulmonary edema.  Nothing to suggest infection and no improvement with aggressive diuresis.  That leaves aspiration pneumonitis or acute inflammatory process. Now on 04/24/14 pt is much improved with steroids and BDs, flutter, IS.  ASSESSMENT / PLAN:  Acute hypoxic respiratory failure with b/l pulmonary infiltrates. Plan: - cont medrol - adjust oxygen to keep SpO2 > 92% - f/u CXR AM 04/25/14 - defer Abx for now - continue diuresis as tolerated per primary team  NSCLC (adenocarcinoma) s/p Rt upper lobectomy 04/20/14. Plan: - post op care, chest tube per primary team  Tobacco abuse. Plan: - smoking cessation  Mariel Sleet [facility] 04/24/2014, 12:22 PM Beeper  [phone]  Cell  [phone]  If no response or cell goes to voicemail, call beeper [phone]

## 2014-04-24 NOTE — Progress Notes (Addendum)
[address]       [employer] [address]             [phone]          4 Days Post-Op Procedure(s) (LRB): VIDEO BRONCHOSCOPY (N/A) VIDEO ASSISTED THORACOSCOPY  (Right) WEDGE RESECTION, RIGHT UPPER LOBE (Right) LOBECTOMY, RIGHT UPPER LOBE WITH PLACEMENT OF ONQ PAIN PUMP (Right) NODE DISSECTION (Right)  Subjective: Feels a little better today, was able to cough out some thick sputum earlier.  Had a BM.  Pain controlled.   Objective: Vital signs in last 24 hours: Patient Vitals for the past 24 hrs:  BP Temp Temp src Pulse Resp SpO2  04/24/14 0315 (!) 163/79 mmHg 98.8 F (37.1 C) Oral (!) 109 (!) 28 97 %  04/24/14 0307 - - - - (!) 26 95 %  04/24/14 0108 - - - - (!) 24 98 %  04/23/14 2341 (!) 98/59 mmHg 98.1 F (36.7 C) Axillary 83 13 99 %  04/23/14 2122 133/70 mmHg - - (!) 110 - -  04/23/14 2113 - - - - (!) 24 98 %  04/23/14 1953 103/66 mmHg 98.3 F (36.8 C) Axillary (!) 107 18 92 %  04/23/14 1951 - - - - - 95 %  04/23/14 1600 - - - - - 95 %  04/23/14 1557 - - - - 18 92 %  04/23/14 1548 - 98.2 F (36.8 C) Oral - - -  04/23/14 1535 118/63 mmHg - - 100 (!) 24 94 %  04/23/14 1246 - - - - - 95 %  04/23/14 1204 101/62 mmHg 97.5 F (36.4 C) Axillary 89 (!) 28 96 %  04/23/14 1200 - - - - (!) 26 99 %  04/23/14 0937 (!) 123/59 mmHg - - (!) 116 - -  04/23/14 0754 - - - - (!) 21 90 %  04/23/14 0752 (!) 97/57 mmHg 99 F (37.2 C) Axillary (!) 113 18 90 %   Current Weight  04/20/14 163 lb (73.936 kg)     Intake/Output from previous day: 03/31 0701 - 04/01 0700 In: 2520 [P.O.:2400; I.V.:120] Out: 2190 [Urine:1900; Chest Tube:290]    PHYSICAL EXAM:  Heart: RRR, mildly tachy around 100 Lungs: Scattered rhonchi bilaterally Wound: Clean and dry Chest tube: No air leak    Lab Results: CBC: Recent Labs  04/22/14 0403 04/23/14 0540  WBC 11.2* 9.0  HGB 10.5* 9.0*  HCT 30.8* 26.8*  PLT 158 144*   BMET:  Recent Labs  04/22/14 0403  04/23/14 0540  NA 133* 133*  K 4.4 4.1  CL 98 98  CO2 30 29  GLUCOSE 106* 109*  BUN 6 6  CREATININE 1.00 0.72  CALCIUM 7.9* 8.0*    PT/INR: No results for input(s): LABPROT, INR in the last 72 hours. BNP 194  CXR: FINDINGS: The lungs are reasonably well inflated. Diffuse alveolar opacities persist throughout the left lung and to a lesser extent on the right. There is mild shift of mediastinum toward the right consistent with the previous right lobectomy. The right-sided upper chest tube tip lies in the pulmonary apex. The lower chest tube tip projects along the mid to upper lateral thoracic wall. There is no pneumothorax. The cardiac silhouette remains enlarged. The internal jugular venous catheter tip projects over the distal third of the SVC.  IMPRESSION: Slight interval improvement in the appearance of the bilateral diffuse airspace opacities consistent with re- solving pulmonary edema and/or pneumonia. There is no  pneumothorax. The support tubes and lines are in appropriate position radiographically.  Assessment/Plan: S/P Procedure(s) (LRB): VIDEO BRONCHOSCOPY (N/A) VIDEO ASSISTED THORACOSCOPY  (Right) WEDGE RESECTION, RIGHT UPPER LOBE (Right) LOBECTOMY, RIGHT UPPER LOBE WITH PLACEMENT OF ONQ PAIN PUMP (Right) NODE DISSECTION (Right)  CT with no air leak.  Should be on water seal per the orders, but still on suction in room.  Will place to water seal and watch. Possibly d/c 1 CT soon.  Pulm- remains on NRB. Appreciate CCM's assistance. Continue Solumedrol, nebs, IS/FV, mucolytics. Will hold on further diuresis for now.  BNP 194  Ambulate in halls.  Pt requests to continue PCA one more day.   LOS: 4 days    [name],[name] 04/24/2014  Overall, breathing better, decreasing need for 02 and chest xray improving Ct to water seal, poss one tube out tomorrow I have seen and examined Elvera Maria and agree with the above assessment  and plan.  Grace Isaac MD Beeper [phone] Office [phone] 04/24/2014 4:40 PM

## 2014-04-24 NOTE — Progress Notes (Signed)
UR COMPLETED  

## 2014-04-25 ENCOUNTER — Inpatient Hospital Stay (HOSPITAL_COMMUNITY): Payer: 59

## 2014-04-25 DIAGNOSIS — C3411 Malignant neoplasm of upper lobe, right bronchus or lung: Principal | ICD-10-CM

## 2014-04-25 DIAGNOSIS — R918 Other nonspecific abnormal finding of lung field: Secondary | ICD-10-CM | POA: Insufficient documentation

## 2014-04-25 LAB — CBC
HCT: 26.9 % — ABNORMAL LOW (ref 36.0–46.0)
Hemoglobin: 9.6 g/dL — ABNORMAL LOW (ref 12.0–15.0)
MCH: 33.8 pg (ref 26.0–34.0)
MCHC: 35.7 g/dL (ref 30.0–36.0)
MCV: 94.7 fL (ref 78.0–100.0)
Platelets: 193 10*3/uL (ref 150–400)
RBC: 2.84 MIL/uL — ABNORMAL LOW (ref 3.87–5.11)
RDW: 13.4 % (ref 11.5–15.5)
WBC: 9.6 10*3/uL (ref 4.0–10.5)

## 2014-04-25 LAB — BASIC METABOLIC PANEL
Anion gap: 3 — ABNORMAL LOW (ref 5–15)
BUN: 11 mg/dL (ref 6–23)
CO2: 35 mmol/L — ABNORMAL HIGH (ref 19–32)
Calcium: 9 mg/dL (ref 8.4–10.5)
Chloride: 94 mmol/L — ABNORMAL LOW (ref 96–112)
Creatinine, Ser: 0.62 mg/dL (ref 0.50–1.10)
GFR calc Af Amer: >90 mL/min (ref >90)
GFR calc non Af Amer: >90 mL/min (ref >90)
Glucose, Bld: 138 mg/dL — ABNORMAL HIGH (ref 70–99)
Potassium: 4 mmol/L (ref 3.5–5.1)
Sodium: 132 mmol/L — ABNORMAL LOW (ref 135–145)

## 2014-04-25 MED ORDER — FUROSEMIDE 10 MG/ML IJ SOLN
40.0000 mg | Freq: Once | INTRAMUSCULAR | Status: AC
  Administered 2014-04-25: 40 mg via INTRAVENOUS
  Filled 2014-04-25: qty 4

## 2014-04-25 MED ORDER — ALPRAZOLAM 0.5 MG PO TABS
0.5000 mg | ORAL_TABLET | Freq: Three times a day (TID) | ORAL | Status: DC | PRN
  Administered 2014-04-25 – 2014-05-02 (×3): 0.5 mg via ORAL
  Filled 2014-04-25 (×3): qty 1

## 2014-04-25 MED ORDER — LACTULOSE 10 GM/15ML PO SOLN
20.0000 g | Freq: Every day | ORAL | Status: DC | PRN
  Administered 2014-05-01: 20 g via ORAL
  Filled 2014-04-25 (×2): qty 30

## 2014-04-25 MED ORDER — WHITE PETROLATUM GEL
Status: AC
  Administered 2014-04-25: 0.2
  Filled 2014-04-25: qty 1

## 2014-04-25 NOTE — Progress Notes (Addendum)
[address]       [place] [address]             [phone]      5 Days Post-Op Procedure(s) (LRB): VIDEO BRONCHOSCOPY (N/A) VIDEO ASSISTED THORACOSCOPY  (Right) WEDGE RESECTION, RIGHT UPPER LOBE (Right) LOBECTOMY, RIGHT UPPER LOBE WITH PLACEMENT OF ONQ PAIN PUMP (Right) NODE DISSECTION (Right)   Subjective:  Gabrielle [name] is tearful this morning.  She continues to have a lot of discomfort from her surgical incision.  She also continues to be very short of breath.  She states this is worse at night and with ambulation and she requires a period of time to recover.  She has ambulated, but states the staff does not ambulate with her but maybe once per day.  Objective: Vital signs in last 24 hours: Temp:  [97.6 F (36.4 C)-98.5 F (36.9 C)] 97.8 F (36.6 C) (04/02 0700) Pulse Rate:  [77-108] 108 (04/02 0930) Cardiac Rhythm:  [-] Sinus tachycardia (04/02 0832) Resp:  [17-28] 28 (04/02 0800) BP: (111-160)/(65-86) 124/65 mmHg (04/02 0930) SpO2:  [89 %-100 %] 98 % (04/02 0900) FiO2 (%):  [55 %-100 %] 55 % (04/02 0900)  Intake/Output from previous day: 04/01 0701 - 04/02 0700 In: 480 [P.O.:480] Out: 1270 [Urine:1100; Chest Tube:170] Intake/Output this shift: Total I/O In: -  Out: 60 [Chest Tube:60]  General appearance: alert, cooperative and no distress Heart: regular rate and rhythm Lungs: rhonchi scattered bilaterally Abdomen: soft, non-tender; bowel sounds normal; no masses,  no organomegaly Extremities: extremities normal, atraumatic, no cyanosis or edema Wound: clean and dry  Lab Results:  Recent Labs  04/23/14 0540 04/25/14 0545  WBC 9.0 9.6  HGB 9.0* 9.6*  HCT 26.8* 26.9*  PLT 144* 193   BMET:  Recent Labs  04/23/14 0540 04/25/14 0545  NA 133* 132*  K 4.1 4.0  CL 98 94*  CO2 29 35*  GLUCOSE 109* 138*  BUN 6 11  CREATININE 0.72 0.62  CALCIUM 8.0* 9.0    PT/INR: No results for input(s): LABPROT, INR in the last 72  hours. ABG    Component Value Date/Time   PHART 7.364 04/21/2014 0411   HCO3 22.8 04/21/2014 0411   TCO2 24 04/21/2014 0411   ACIDBASEDEF 3.0* 04/21/2014 0411   O2SAT 90.0 04/21/2014 0411   CBG (last 3)  No results for input(s): GLUCAP in the last 72 hours.  Assessment/Plan: S/P Procedure(s) (LRB): VIDEO BRONCHOSCOPY (N/A) VIDEO ASSISTED THORACOSCOPY  (Right) WEDGE RESECTION, RIGHT UPPER LOBE (Right) LOBECTOMY, RIGHT UPPER LOBE WITH PLACEMENT OF ONQ PAIN PUMP (Right) NODE DISSECTION (Right)  1. Chest tube- no air leak appreciated, CXR free from pneumothorax, however there are scattered hazy opacities present 2. Pulm- scattered opacities on CXR, on NRB encouraged use of IS, appreciate pulmonary assistance 3. Pain control- will continue PCA until chest tubes are removed, her ON-Q was clamped and therefore has not been infusing at all since surgery, will continue 4. Dispo- will d/c one chest tube today, leave On-Q in place, leave chest tube to water.  Pathology reports Adenocarcinoma, unsure if patient has been informed per nursing she has asked multiple times what the report is.   LOS: 5 days    Gabrielle [name] 04/25/2014  I have seen and examined the patient and agree with the assessment and plan as outlined.  Oxygenation stable.  Diffuse bilateral pulmonary opacity on CXR stable.  Might benefit from additional diuresis.  Pathology report discussed with patient.  Gabrielle [name] 04/25/2014 11:20 AM

## 2014-04-25 NOTE — Progress Notes (Signed)
Name: Gabrielle [name] MRN: [id number] DOB: August 27, 1955    ADMISSION DATE:  04/20/2014 CONSULTATION DATE:  04/23/2014  REFERRING MD :  Dr. Servando Snare  CHIEF COMPLAINT:  Short of breath  BRIEF PATIENT DESCRIPTION:  59 yo female smoker with RUL nodule and hx of breast cancer had Rt upper lobectomy on 04/20/14.  Developed progressive dyspnea, hypoxia, and progressive pulmonary infiltrates post-op and PCCM consulted.  SIGNIFICANT EVENTS  3/28 Admit, Rt upper lobectomy 3/31 Start trial of steroids  STUDIES:  PET scan 10/08/03 >> hypermetabolic RUL nodule 1.4 x 1.3 cm PFT 04/07/14 >> FEV1 1.91 (85%), FEV1% 73, TLC 4.22 (98%), DLCO 56%  LINES/TUBES: Rt IJ CVL 3/28 >> Rt chest tube 3/28 >>    SUBJECTIVE:  Pt doing better, more productive cough. Denies home neb or inhalers " never needed them"  VITAL SIGNS: Temp:  [97.6 F (36.4 C)-98.5 F (36.9 C)] 97.8 F (36.6 C) (04/02 0700) Pulse Rate:  [77-108] 108 (04/02 0930) Resp:  [17-28] 28 (04/02 0800) BP: (111-160)/(65-86) 124/65 mmHg (04/02 0930) SpO2:  [89 %-100 %] 98 % (04/02 0900) FiO2 (%):  [55 %-100 %] 55 % (04/02 0900)  PHYSICAL EXAMINATION: General: sitting in chair, being examined by [facility] PA Neuro:  Alert, normal strength, CN intact HEENT:  Wears glasses, NRB with nasal cannula, strabismus Cardiovascular:  Regular,RRR Lungs:  B/l wet crackles/ wheeze, unlabored at rest on O2 Abdomen:  Soft, non tender Musculoskeletal:  No edema, no joint swelling Skin:  No rashes   CBC Recent Labs     04/23/14  0540  04/25/14  0545  WBC  9.0  9.6  HGB  9.0*  9.6*  HCT  26.8*  26.9*  PLT  144*  193    BMET Recent Labs     04/23/14  0540  04/25/14  0545  NA  133*  132*  K  4.1  4.0  CL  98  94*  CO2  29  35*  BUN  6  11  CREATININE  0.72  0.62  GLUCOSE  109*  138*    Electrolytes Recent Labs     04/23/14  0540  04/25/14  0545  CALCIUM  8.0*  9.0    ABG No results for input(s): PHART, PCO2ART, PO2ART in the  last 72 hours.  Liver Enzymes No results for input(s): AST, ALT, ALKPHOS, BILITOT, ALBUMIN in the last 72 hours.  Glucose No results for input(s): GLUCAP in the last 72 hours.  Imaging Dg Chest Port 1 View  04/25/2014   CLINICAL DATA:  Upper lobe lung cancer. Short of breath. Followup exam.  EXAM: PORTABLE CHEST - 1 VIEW  COMPARISON:  04/24/2014  FINDINGS: Right-sided chest tubes are stable. No pneumothorax. Hazy airspace lung opacities, more prominent on the left, are without change. No new areas of lung opacity. No convincing pleural effusion.  Cardiac silhouette is mildly enlarged.  No mediastinal widening.  Right internal jugular central venous line is stable with its tip in the lower superior vena cava.  IMPRESSION: 1. No change from the previous day's study. 2. Status post right lung surgery. Right-sided chest tubes are stable. No pneumothorax. 3. Persistent hazy airspace lung opacities, also stable. No new abnormalities.   Electronically Signed   By: Lajean Manes M.D.   On: 04/25/2014 07:49   Dg Chest Port 1 View  04/24/2014   CLINICAL DATA:  Lung malignancy status post wedge resection/ lobectomy on April 20, 2014  EXAM: PORTABLE CHEST - 1 VIEW  COMPARISON:  Portable chest x-ray of April 23, 2014  FINDINGS: The lungs are reasonably well inflated. Diffuse alveolar opacities persist throughout the left lung and to a lesser extent on the right. There is mild shift of mediastinum toward the right consistent with the previous right lobectomy. The right-sided upper chest tube tip lies in the pulmonary apex. The lower chest tube tip projects along the mid to upper lateral thoracic wall. There is no pneumothorax. The cardiac silhouette remains enlarged. The internal jugular venous catheter tip projects over the distal third of the SVC.  IMPRESSION: Slight interval improvement in the appearance of the bilateral diffuse airspace opacities consistent with re- solving pulmonary edema and/or pneumonia. There  is no pneumothorax. The support tubes and lines are in appropriate position radiographically.   Electronically Signed   By: David  Martinique   On: 04/24/2014 07:53    DISCUSSION: She has progressive pulmonary infiltrates after surgery.  Differential includes infection, aspiration, inflammatory process, or pulmonary edema.  Nothing to suggest infection and no improvement with aggressive diuresis.  That leaves aspiration pneumonitis or acute inflammatory process. Now on 04/24/14 pt is much improved with steroids and BDs, flutter, IS.  ASSESSMENT / PLAN:  Acute hypoxic respiratory failure with b/l pulmonary infiltrates. CXR 4/2 unchanged- I reviewed image Plan: - off medrol for now. Consider restart after a few days post op if needed - adjust oxygen to keep SpO2 > 92% - defer Abx for now - continue diuresis as tolerated per primary team - emphasis now on rehab/ mobilization  NSCLC (adenocarcinoma) s/p Rt upper lobectomy 04/20/14. Plan: - post op care, chest tube per primary team  Tobacco abuse. Plan: - smoking cessation  Aubriel Khanna DMD [place] 04/25/2014, 11:11 AM Beeper  [phone]  Cell  [phone]  If no response or cell goes to voicemail, call beeper [phone]

## 2014-04-26 ENCOUNTER — Inpatient Hospital Stay (HOSPITAL_COMMUNITY): Payer: 59

## 2014-04-26 MED ORDER — FUROSEMIDE 10 MG/ML IJ SOLN
40.0000 mg | Freq: Once | INTRAMUSCULAR | Status: AC
  Administered 2014-04-26: 40 mg via INTRAVENOUS
  Filled 2014-04-26: qty 4

## 2014-04-26 MED ORDER — OXYCODONE HCL 5 MG PO TABS
5.0000 mg | ORAL_TABLET | ORAL | Status: DC | PRN
  Administered 2014-04-26 – 2014-04-27 (×2): 5 mg via ORAL
  Administered 2014-04-28: 10 mg via ORAL
  Administered 2014-04-28 – 2014-04-29 (×2): 5 mg via ORAL
  Administered 2014-04-29 – 2014-05-02 (×5): 10 mg via ORAL
  Filled 2014-04-26: qty 2
  Filled 2014-04-26: qty 1
  Filled 2014-04-26: qty 2
  Filled 2014-04-26: qty 1
  Filled 2014-04-26 (×5): qty 2
  Filled 2014-04-26: qty 1

## 2014-04-26 NOTE — Progress Notes (Signed)
Name: Gabrielle [name] MRN: [id number] DOB: 04/27/1955    ADMISSION DATE:  04/20/2014 CONSULTATION DATE:  04/23/2014  REFERRING MD :  Dr. Servando Snare  CHIEF COMPLAINT:  Short of breath  BRIEF PATIENT DESCRIPTION:  59 yo female smoker with RUL nodule and hx of breast cancer had Rt upper lobectomy on 04/20/14.  Developed progressive dyspnea, hypoxia, and progressive pulmonary infiltrates post-op and PCCM consulted.  SIGNIFICANT EVENTS  3/28 Admit, Rt upper lobectomy 3/31 Start trial of steroids  STUDIES:  PET scan 4/74/25 >> hypermetabolic RUL nodule 1.4 x 1.3 cm PFT 04/07/14 >> FEV1 1.91 (85%), FEV1% 73, TLC 4.22 (98%), DLCO 56%  LINES/TUBES: Rt IJ CVL 3/28 >> Rt chest tube 3/28 >>    SUBJECTIVE:  Obviously feeling better. Cough more productive. She asks if "any of this is due to my chickens"  VITAL SIGNS: Temp:  [97.2 F (36.2 C)-98.5 F (36.9 C)] 97.5 F (36.4 C) (04/03 0700) Pulse Rate:  [69-90] 90 (04/03 0340) Resp:  [13-22] 15 (04/03 0340) BP: (132-159)/(69-105) 159/82 mmHg (04/03 0340) SpO2:  [92 %-100 %] 94 % (04/03 0340) FiO2 (%):  [35 %] 35 % (04/02 1422) Weight:  [75.2 kg (165 lb 12.6 oz)] 75.2 kg (165 lb 12.6 oz) (04/03 0340)  PHYSICAL EXAMINATION: General: In bed. Nurse in room, pleasant Neuro:  Alert, normal strength, CN intact HEENT:  Wears glasses, NRB with nasal cannula, strabismus Cardiovascular:  Regular,RRR Lungs:  Mild bilateral wheezewheeze, unlabored at rest on O2 Abdomen:  Soft, non tender Musculoskeletal:  No edema, no joint swelling Skin:  No rashes   CBC Recent Labs     04/25/14  0545  WBC  9.6  HGB  9.6*  HCT  26.9*  PLT  193    BMET Recent Labs     04/25/14  0545  NA  132*  K  4.0  CL  94*  CO2  35*  BUN  11  CREATININE  0.62  GLUCOSE  138*    Electrolytes Recent Labs     04/25/14  0545  CALCIUM  9.0    ABG No results for input(s): PHART, PCO2ART, PO2ART in the last 72 hours.  Liver Enzymes No results for  input(s): AST, ALT, ALKPHOS, BILITOT, ALBUMIN in the last 72 hours.  Glucose No results for input(s): GLUCAP in the last 72 hours.  Imaging Dg Chest Port 1 View  04/26/2014   CLINICAL DATA:  Recent right lobectomy. Short of breath, 1 week duration.  EXAM: PORTABLE CHEST - 1 VIEW  COMPARISON:  04/25/2014  FINDINGS: Right-sided chest tube remains in place. No pneumothorax. Right internal jugular central line has its tip in the SVC above the right atrium. Mild edema persists. No significant pulmonary collapse.  IMPRESSION: Recent lobectomy on the right. Chest tube in place. No pneumothorax. Mild pulmonary edema.   Electronically Signed   By: Nelson Chimes M.D.   On: 04/26/2014 10:13   Dg Chest Port 1 View  04/25/2014   CLINICAL DATA:  Upper lobe lung cancer. Short of breath. Followup exam.  EXAM: PORTABLE CHEST - 1 VIEW  COMPARISON:  04/24/2014  FINDINGS: Right-sided chest tubes are stable. No pneumothorax. Hazy airspace lung opacities, more prominent on the left, are without change. No new areas of lung opacity. No convincing pleural effusion.  Cardiac silhouette is mildly enlarged.  No mediastinal widening.  Right internal jugular central venous line is stable with its tip in the lower superior vena cava.  IMPRESSION: 1. No change from  the previous day's study. 2. Status post right lung surgery. Right-sided chest tubes are stable. No pneumothorax. 3. Persistent hazy airspace lung opacities, also stable. No new abnormalities.   Electronically Signed   By: Lajean Manes M.D.   On: 04/25/2014 07:49    DISCUSSION: She had progressive pulmonary infiltrates after surgery.  Differential includes infection, aspiration, inflammatory process, or pulmonary edema.  Nothing to suggest infection. Improving with diuresis and cxr now looks like edema..  ASSESSMENT / PLAN:  Acute hypoxic respiratory failure with b/l pulmonary infiltrates. CXR 4/3 little change Plan: - adjust oxygen to keep SpO2 > 92% - defer Abx  for now - continue diuresis as tolerated per primary team - emphasis now on rehab/ mobilization - anticipate adding inhalers (new to her), but continuing solumedrol and nebs while still has considerable wheeze.  NSCLC (adenocarcinoma) s/p Rt upper lobectomy 04/20/14. Plan: - post op care, chest tube per primary team  Tobacco abuse. Plan: - smoking cessation  Marco Adelson DMD [place] 04/26/2014, 10:35 AM Beeper  [phone]  Cell  [phone]  If no response or cell goes to voicemail, call beeper [phone]

## 2014-04-26 NOTE — Progress Notes (Addendum)
      [address]       [employer] [address]             [phone]      6 Days Post-Op Procedure(s) (LRB): VIDEO BRONCHOSCOPY (N/A) VIDEO ASSISTED THORACOSCOPY  (Right) WEDGE RESECTION, RIGHT UPPER LOBE (Right) LOBECTOMY, RIGHT UPPER LOBE WITH PLACEMENT OF ONQ PAIN PUMP (Right) NODE DISSECTION (Right)   Subjective:  Gabrielle [name] is feeling much better this morning.  She is off a NRB and is on 3L of oxygen.  She has ambulated without much difficulty.  She requests her PCA be discontinued.   Objective: Vital signs in last 24 hours: Temp:  [97.2 F (36.2 C)-98.5 F (36.9 C)] 97.5 F (36.4 C) (04/03 0700) Pulse Rate:  [69-90] 90 (04/03 0340) Cardiac Rhythm:  [-] Normal sinus rhythm (04/03 0340) Resp:  [13-22] 15 (04/03 0340) BP: (132-159)/(69-105) 159/82 mmHg (04/03 0340) SpO2:  [92 %-100 %] 97 % (04/03 1040) FiO2 (%):  [35 %] 35 % (04/02 1422) Weight:  [165 lb 12.6 oz (75.2 kg)] 165 lb 12.6 oz (75.2 kg) (04/03 0340)  Intake/Output from previous day: 04/02 0701 - 04/03 0700 In: 1790 [P.O.:1560; I.V.:230] Out: 1975 [FOYDX:4128; Chest Tube:200] Intake/Output this shift: Total I/O In: -  Out: 50 [Chest Tube:50]  General appearance: alert, cooperative and no distress Heart: regular rate and rhythm Lungs: wheezes bilaterally, rhonchi resolved Abdomen: soft, non-tender; bowel sounds normal; no masses,  no organomegaly Wound: clean and drty  Lab Results:  Recent Labs  04/25/14 0545  WBC 9.6  HGB 9.6*  HCT 26.9*  PLT 193   BMET:  Recent Labs  04/25/14 0545  NA 132*  K 4.0  CL 94*  CO2 35*  GLUCOSE 138*  BUN 11  CREATININE 0.62  CALCIUM 9.0    PT/INR: No results for input(s): LABPROT, INR in the last 72 hours. ABG    Component Value Date/Time   PHART 7.364 04/21/2014 0411   HCO3 22.8 04/21/2014 0411   TCO2 24 04/21/2014 0411   ACIDBASEDEF 3.0* 04/21/2014 0411   O2SAT 90.0 04/21/2014 0411   CBG (last 3)  No results for  input(s): GLUCAP in the last 72 hours.  Assessment/Plan: S/P Procedure(s) (LRB): VIDEO BRONCHOSCOPY (N/A) VIDEO ASSISTED THORACOSCOPY  (Right) WEDGE RESECTION, RIGHT UPPER LOBE (Right) LOBECTOMY, RIGHT UPPER LOBE WITH PLACEMENT OF ONQ PAIN PUMP (Right) NODE DISSECTION (Right)  1. Chest tube - no air leak appreciated, no pneumothorax, CXR remains stable in regards to infiltrates, some improvement in pulmonary edema, will d/c one chest tube today 2. Pulm- wean oxygen as tolerated, down to 3L, encouraged use of IS, pulmonary edema improving will diurese today 3. Pain control- d/c PCA per patient request and will use OXY, Tramadol 4. Dispo- patient doing much better today, no air leak with chest tube will d/c one tube, repeat diuretic for pulmonary edema, wean oxygen as tolerate, ambulate   LOS: 6 days    BARRETT, ERIN 04/26/2014  I have seen and examined the patient and agree with the assessment and plan as outlined.  Patient has responded quite well to diuresis.  Gabrielle [name] 04/26/2014 12:04 PM

## 2014-04-27 ENCOUNTER — Inpatient Hospital Stay (HOSPITAL_COMMUNITY): Payer: 59

## 2014-04-27 DIAGNOSIS — J9620 Acute and chronic respiratory failure, unspecified whether with hypoxia or hypercapnia: Secondary | ICD-10-CM

## 2014-04-27 DIAGNOSIS — J95821 Acute postprocedural respiratory failure: Secondary | ICD-10-CM

## 2014-04-27 MED ORDER — FAMOTIDINE 40 MG PO TABS
40.0000 mg | ORAL_TABLET | Freq: Every day | ORAL | Status: DC
  Administered 2014-04-27 – 2014-05-01 (×4): 40 mg via ORAL
  Filled 2014-04-27 (×6): qty 1

## 2014-04-27 MED ORDER — METHYLPREDNISOLONE SODIUM SUCC 125 MG IJ SOLR
60.0000 mg | Freq: Two times a day (BID) | INTRAMUSCULAR | Status: DC
  Administered 2014-04-27 – 2014-04-28 (×2): 60 mg via INTRAVENOUS
  Filled 2014-04-27 (×2): qty 0.96

## 2014-04-27 MED ORDER — FUROSEMIDE 10 MG/ML IJ SOLN
40.0000 mg | Freq: Once | INTRAMUSCULAR | Status: AC
  Administered 2014-04-27: 40 mg via INTRAVENOUS
  Filled 2014-04-27: qty 4

## 2014-04-27 NOTE — Progress Notes (Signed)
Name: Gabrielle [name] MRN: [id number] DOB: 04-03-1955    ADMISSION DATE:  04/20/2014 CONSULTATION DATE:  04/23/2014  REFERRING MD :  Dr. Servando Snare  CHIEF COMPLAINT:  Short of breath  BRIEF PATIENT DESCRIPTION:  59 yo female smoker with RUL nodule and hx of breast cancer had Rt upper lobectomy on 04/20/14.  Developed progressive dyspnea, hypoxia, and progressive pulmonary infiltrates post-op and PCCM consulted.  History  Smoking status  . Current Every Day Smoker -- 1.00 packs/day for 40 years  . Types: Cigarettes  Smokeless tobacco  . Never Used     STUDIES:  PET scan 2/37/62 >> hypermetabolic RUL nodule 1.4 x 1.3 cm PFT 04/07/14 >> FEV1 1.91 (85%), FEV1% 73, TLC 4.22 (98%), DLCO 56%  LINES/TUBES: Rt IJ CVL 3/28 >> Rt chest tube 3/28 >>   SIGNIFICANT EVENTS  3/28 Admit, Rt upper lobectomy 3/31 Start trial of steroids 04/26/14: Obviously feeling better. Cough more productive. She asks if "any of this is due to my chickens"    SUBJECTIVE/OVERNIGHT/INTERVAL HX 04/27/14:   VITAL SIGNS: Temp:  [97.5 F (36.4 C)-99.4 F (37.4 C)] 98.4 F (36.9 C) (04/04 1232) Pulse Rate:  [64-81] 64 (04/04 1232) Resp:  [13-21] 21 (04/04 1232) BP: (118-174)/(71-88) 118/84 mmHg (04/04 1232) SpO2:  [92 %-98 %] 93 % (04/04 1232)  PHYSICAL EXAMINATION: General: In bed. Nurse in room, pleasant Neuro:  Alert, normal strength, CN intact HEENT:  Wears glasses, NRB with nasal cannula, strabismus Cardiovascular:  Regular,RRR Lungs:  Mild bilateral wheezewheeze, unlabored at rest on O2 Abdomen:  Soft, non tender Musculoskeletal:  No edema, no joint swelling Skin:  No rashes   PULMONARY  Recent Labs Lab 04/21/14 0411  PHART 7.364  PCO2ART 39.8  PO2ART 58.0*  HCO3 22.8  TCO2 24  O2SAT 90.0    CBC  Recent Labs Lab 04/22/14 0403 04/23/14 0540 04/25/14 0545  HGB 10.5* 9.0* 9.6*  HCT 30.8* 26.8* 26.9*  WBC 11.2* 9.0 9.6  PLT 158 144* 193    COAGULATION No results for  input(s): INR in the last 168 hours.  CARDIAC  No results for input(s): TROPONINI in the last 168 hours. No results for input(s): PROBNP in the last 168 hours.   CHEMISTRY  Recent Labs Lab 04/21/14 0400 04/22/14 0403 04/23/14 0540 04/25/14 0545  NA 133* 133* 133* 132*  K 3.6 4.4 4.1 4.0  CL 100 98 98 94*  CO2 27 30 29  35*  GLUCOSE 120* 106* 109* 138*  BUN <5* 6 6 11   CREATININE 0.67 1.00 0.72 0.62  CALCIUM 7.9* 7.9* 8.0* 9.0   Estimated Creatinine Clearance: 72.9 mL/min (by C-G formula based on Cr of 0.62).   LIVER  Recent Labs Lab 04/22/14 0403  AST 64*  ALT 21  ALKPHOS 69  BILITOT 0.8  PROT 5.4*  ALBUMIN 2.8*     INFECTIOUS No results for input(s): LATICACIDVEN, PROCALCITON in the last 168 hours.   ENDOCRINE CBG (last 3)  No results for input(s): GLUCAP in the last 72 hours.       IMAGING x48h Dg Chest Port 1 View  04/27/2014   CLINICAL DATA:  Pulmonary edema.  EXAM: PORTABLE CHEST - 1 VIEW  COMPARISON:  April 26, 2014  FINDINGS: Chest tube remains on the right, although the tip of the right chest tube is more inferiorly position compared to 1 day prior. There is a small right apical pneumothorax which appears essentially stable compared to 1 day prior. No tension component. Central catheter tip  is in the superior vena cava, stable. There remains generalized interstitial pulmonary edema. There is no appreciable airspace consolidation. Heart is mildly enlarged with mild pulmonary venous hypertension. No adenopathy.  IMPRESSION: Small right apical pneumothorax with chest tube on the right present. Stable interstitial edema consistent with a degree of congestive heart failure. No new opacity. No change in cardiac silhouette.   Electronically Signed   By: Lowella Grip III M.D.   On: 04/27/2014 07:39   Dg Chest Port 1 View  04/26/2014   CLINICAL DATA:  Recent right lobectomy. Short of breath, 1 week duration.  EXAM: PORTABLE CHEST - 1 VIEW  COMPARISON:   04/25/2014  FINDINGS: Right-sided chest tube remains in place. No pneumothorax. Right internal jugular central line has its tip in the SVC above the right atrium. Mild edema persists. No significant pulmonary collapse.  IMPRESSION: Recent lobectomy on the right. Chest tube in place. No pneumothorax. Mild pulmonary edema.   Electronically Signed   By: Nelson Chimes M.D.   On: 04/26/2014 10:13       DISCUSSION: She had progressive pulmonary infiltrates after surgery.  Differential includes infection, aspiration, inflammatory process, or pulmonary edema.  Nothing to suggest infection. Improving with diuresis and cxr now looks like edema..  ASSESSMENT / PLAN:  Acute hypoxic respiratory failure with b/l pulmonary infiltrates. CXR 4/3 and 04/27/14 little change but clinically markedly better after lasix and steropids. She reports significant GERD with acid contents each morning . Possible this was mechanism for ALI  Plan: - continue steroids and lasix - step up GERD Rx  - get echo - adjust oxygen to keep SpO2 > 92% - defer Abx - continue diuresis as tolerated per primary team - emphasis now on rehab/ mobilization -   NSCLC (adenocarcinoma) s/p Rt upper lobectomy 04/20/14. Plan: - post op care, chest tube per primary team  Tobacco abuse. Plan: - smoking cessation     Dr. Brand Males, M.D., [facility].C.P Pulmonary and Critical Care Medicine Staff Physician [facility] Pager: [phone], If no answer or between  15:00h - 7:00h: call [phone]  04/27/2014 1:45 PM

## 2014-04-27 NOTE — Progress Notes (Addendum)
[address]       [employer] [address]             [phone]          7 Days Post-Op Procedure(s) (LRB): VIDEO BRONCHOSCOPY (N/A) VIDEO ASSISTED THORACOSCOPY  (Right) WEDGE RESECTION, RIGHT UPPER LOBE (Right) LOBECTOMY, RIGHT UPPER LOBE WITH PLACEMENT OF ONQ PAIN PUMP (Right) NODE DISSECTION (Right)  Subjective: Feels a lot better today. Breathing slowly improving.  More comfortable now that a CT is out.   Objective: Vital signs in last 24 hours: Patient Vitals for the past 24 hrs:  BP Temp Temp src Pulse Resp SpO2  04/27/14 0601 (!) 170/88 mmHg - - 73 - -  04/27/14 0411 (!) 148/71 mmHg 98 F (36.7 C) Axillary 69 13 92 %  04/27/14 0010 (!) 169/83 mmHg - - 68 20 94 %  04/26/14 2350 (!) 174/87 mmHg 97.7 F (36.5 C) Axillary 68 17 95 %  04/26/14 2026 (!) 152/75 mmHg 99.4 F (37.4 C) Axillary 80 19 92 %  04/26/14 1900 - - - - - 94 %  04/26/14 1608 - 98.4 F (36.9 C) Oral - - -  04/26/14 1558 (!) 157/78 mmHg - - 81 16 96 %  04/26/14 1422 - - - - - 98 %  04/26/14 1141 138/72 mmHg 98.6 F (37 C) Oral - - -  04/26/14 1040 - - - - - 97 %   Current Weight  04/26/14 165 lb 12.6 oz (75.2 kg)     Intake/Output from previous day: 04/03 0701 - 04/04 0700 In: 1560 [P.O.:1560] Out: 9798 [Urine:1300; Chest Tube:275]    PHYSICAL EXAM:  Heart: RRR Lungs: Scattered exp wheezes  Wound: Clean and dry Chest tube: No air leak    Lab Results: CBC: Recent Labs  04/25/14 0545  WBC 9.6  HGB 9.6*  HCT 26.9*  PLT 193   BMET:  Recent Labs  04/25/14 0545  NA 132*  K 4.0  CL 94*  CO2 35*  GLUCOSE 138*  BUN 11  CREATININE 0.62  CALCIUM 9.0    PT/INR: No results for input(s): LABPROT, INR in the last 72 hours.  CXR: FINDINGS: Chest tube remains on the right, although the tip of the right chest tube is more inferiorly position compared to 1 day prior. There is a small right apical pneumothorax which appears essentially stable compared  to 1 day prior. No tension component. Central catheter tip is in the superior vena cava, stable. There remains generalized interstitial pulmonary edema. There is no appreciable airspace consolidation. Heart is mildly enlarged with mild pulmonary venous hypertension. No adenopathy.  IMPRESSION: Small right apical pneumothorax with chest tube on the right present. Stable interstitial edema consistent with a degree of congestive heart failure. No new opacity. No change in cardiac silhouette.  Assessment/Plan: S/P Procedure(s) (LRB): VIDEO BRONCHOSCOPY (N/A) VIDEO ASSISTED THORACOSCOPY  (Right) WEDGE RESECTION, RIGHT UPPER LOBE (Right) LOBECTOMY, RIGHT UPPER LOBE WITH PLACEMENT OF ONQ PAIN PUMP (Right) NODE DISSECTION (Right) CT with no air leak, small apical ptx on CXR. Hopefully can d/c remaining CT soon. Pulm- still on 3-4 L O2.  Continue nebs, steroids, diuresis.  Wean O2 as tolerated.  May need home O2 at d/c. Continue ambulation.   LOS: 7 days    [name],[name] 04/27/2014   Inhalers per pulmonary Decrease dosing of steroids  Feels better, chest xray improving, o2 demand decreasing D/c remaining chest tube I have seen and examined  Gabrielle [name] and agree with the above assessment  and plan.  Grace Isaac MD Beeper [phone] Office [phone] 04/27/2014 10:51 AM

## 2014-04-27 NOTE — Op Note (Signed)
[name], [name] NO.:  [id number]  MEDICAL RECORD NO.:  [id number]  LOCATION:  3S05C                        FACILITY:  [facility]  PHYSICIAN:  Lanelle Bal, MD    DATE OF BIRTH:  1955/11/11  DATE OF PROCEDURE:  04/20/2014 DATE OF DISCHARGE:                              OPERATIVE REPORT   PREOPERATIVE DIAGNOSIS:  New right upper lobe lung nodule, suspicious for carcinoma.  POSTOPERATIVE DIAGNOSIS:  Adenocarcinoma of the lung.  PROCEDURE PERFORMED:  Video bronchoscopy, left video-assisted thoracoscopy with wedge resection of right upper lobe lung nodule, and subsequent completion right upper lobectomy with lymph node dissection, placement of On-Q, and lymph node dissection.  SURGEON:  Lanelle Bal, MD  FIRST ASSISTANT:  John Giovanni, PA-C  BRIEF HISTORY:  The patient is a 59 year old female with long smoking history and previous history of carcinoma of the breast, presented with CT scan suggestive of a new right upper lobe lung nodule which was not present 2 years previously.  PET scan suggested hypermetabolic lesion without evidence of lymph node involvement.  The patient had adequate PFTs with some mild diffusion deficit.  Surgical resection both for diagnosis and treatment was recommended to the patient who agreed and signed informed consent.  DESCRIPTION OF PROCEDURE:  The patient underwent general endotracheal anesthesia without incident.  A single-lumen endotracheal tube was placed through this tube.  Fiberoptic bronchoscope was passed to the subsegmental level, both in left and right tracheobronchial tree without evidence of endobronchial lesions.  The scope was then removed and the single-lumen tube was changed to a double-lumen endotracheal tube.  The patient was then placed in lateral decubitus position with the right side up.  A second time-out was performed and confirmed the patient's preoperative markings and correlated with  radiographic findings.  With the right chest prepped and draped initially, 2 port sites were created. Through this, a 30-degree video scope was passed, and through the other the lung was manipulated.  A third port site was created more anteriorly, approximately the fourth intercostal space with this.  We were able to identify area of the upper part of the lung that was abnormal and using a purple stapler, a wedge resection of this area was performed.  The specimen was submitted to pathology and confirmed adenocarcinoma.  We then enlarged the middle port site and through this incision and the other 2 port sites, proceeded to perform a completion right upper lobectomy.  Initially, anteriorly, the right superior pulmonary vein was identified and dissected around, and using a gold-tip vascular stapler, the vein was divided.  We then proceeded superiorly identifying the right upper lobe pulmonary artery branches.  There were 3 total identified with dissection.  Each of these were identified and stapled with a gold- tip stapler.  The minor and major fissure were relatively well demarcated.  Remnant of the major fissure was divided with a purple stapler.  We then clamped the right upper lobe bronchus with a black load stapler and inflated the lung, confirming the middle and lower lobes inflated well.  The bronchus was divided.  The specimen was placed in a specimen bag and then brought out through the largest incision and submitted to Pathology.  We then proceeded to dissect out the 4R lymph node, 10, 11, and 8R lymph nodes.  The inferior pulmonary ligament was divided up to the inferior pulmonary vein.  The On-Q device catheter through a separate site posteriorly was tunneled subpleurally along the posterior ribs with operative field hemostatic.  We tested the bronchial stump without evidence of air leak.  Through two of the port sites, one anterior and one posterior, a 28-Blake drain was left  posteriorly and a standard chest tube anteriorly.  The larger incision was then closed with pericostal sutures drilled through the bone and the muscle layers closed with interrupted 2-0 Vicryl in subcutaneous tissue and a 3-0 subcuticular stitch in the skin edges.  The lung reinflated nicely at the completion of procedure.  There was no air leak.  Estimated blood loss was 250 mL.  Sponge and needle count was reported as correct at completion of the procedure.  The patient tolerated the procedure without obvious complication and was extubated in the operating room and transferred to the recovery room for further postoperative care.     Lanelle Bal, MD     EG/MEDQ  D:  04/26/2014  T:  04/27/2014  Job:  [id number]

## 2014-04-28 ENCOUNTER — Inpatient Hospital Stay (HOSPITAL_COMMUNITY): Payer: 59

## 2014-04-28 DIAGNOSIS — I340 Nonrheumatic mitral (valve) insufficiency: Secondary | ICD-10-CM

## 2014-04-28 DIAGNOSIS — J810 Acute pulmonary edema: Secondary | ICD-10-CM

## 2014-04-28 DIAGNOSIS — J9621 Acute and chronic respiratory failure with hypoxia: Secondary | ICD-10-CM

## 2014-04-28 MED ORDER — FUROSEMIDE 10 MG/ML IJ SOLN
40.0000 mg | Freq: Three times a day (TID) | INTRAMUSCULAR | Status: AC
  Administered 2014-04-28 (×2): 40 mg via INTRAVENOUS
  Filled 2014-04-28 (×2): qty 4

## 2014-04-28 MED ORDER — POTASSIUM CHLORIDE CRYS ER 20 MEQ PO TBCR
40.0000 meq | EXTENDED_RELEASE_TABLET | Freq: Three times a day (TID) | ORAL | Status: AC
  Administered 2014-04-28 (×2): 40 meq via ORAL
  Filled 2014-04-28 (×2): qty 2

## 2014-04-28 MED ORDER — PREDNISONE 20 MG PO TABS
40.0000 mg | ORAL_TABLET | Freq: Every day | ORAL | Status: DC
  Administered 2014-04-28 – 2014-05-02 (×5): 40 mg via ORAL
  Filled 2014-04-28 (×7): qty 2

## 2014-04-28 NOTE — Progress Notes (Signed)
Name: Gabrielle [name] MRN: [id number] DOB: 1955/07/22    ADMISSION DATE:  04/20/2014 CONSULTATION DATE:  04/23/2014  REFERRING MD :  Dr. Servando Snare  CHIEF COMPLAINT:  Short of breath  BRIEF PATIENT DESCRIPTION:  59 yo female smoker with RUL nodule and hx of breast cancer had Rt upper lobectomy on 04/20/14.  Developed progressive dyspnea, hypoxia, and progressive pulmonary infiltrates post-op and PCCM consulted.  STUDIES:  PET scan 6/65/99 >> hypermetabolic RUL nodule 1.4 x 1.3 cm PFT 04/07/14 >> FEV1 1.91 (85%), FEV1% 73, TLC 4.22 (98%), DLCO 56%  LINES/TUBES: Rt IJ CVL 3/28 >> Rt chest tube 3/28 >>   SIGNIFICANT EVENTS  3/28 Admit, Rt upper lobectomy 3/31 Start trial of steroids 04/26/14: Obviously feeling better. Cough more productive. She asks if "any of this is due to my chickens"    SUBJECTIVE/OVERNIGHT/INTERVAL HX No distress. Using less O2; pain tolerable. Using IS and ambulating.   VITAL SIGNS: Temp:  [97.7 F (36.5 C)-98.9 F (37.2 C)] 97.7 F (36.5 C) (04/05 0730) Pulse Rate:  [59-71] 63 (04/05 0730) Resp:  [14-26] 26 (04/05 0730) BP: (118-164)/(66-89) 157/74 mmHg (04/05 0730) SpO2:  [92 %-97 %] 94 % (04/05 0730) 3 liters  Intake/Output Summary (Last 24 hours) at 04/28/14 0934 Last data filed at 04/27/14 1800  Gross per 24 hour  Intake    600 ml  Output    606 ml  Net     -6 ml   PHYSICAL EXAMINATION: General:up in chair. No distress.  Neuro:  Alert, normal strength, CN intact HEENT:  Wears glasses, NRB with nasal cannula, strabismus Cardiovascular:  Regular,RRR Lungs: unlabored. Rales right posterior base. No accessory muscle. Right surgical dressing intact. Surgical site bruising noted.  Abdomen:  Soft, non tender Musculoskeletal:  No edema, no joint swelling Skin:  No rashes   PULMONARY No results for input(s): PHART, PCO2ART, PO2ART, HCO3, TCO2, O2SAT in the last 168 hours.  Invalid input(s): PCO2, PO2  CBC  Recent Labs Lab  04/22/14 0403 04/23/14 0540 04/25/14 0545  HGB 10.5* 9.0* 9.6*  HCT 30.8* 26.8* 26.9*  WBC 11.2* 9.0 9.6  PLT 158 144* 193   CHEMISTRY  Recent Labs Lab 04/22/14 0403 04/23/14 0540 04/25/14 0545  NA 133* 133* 132*  K 4.4 4.1 4.0  CL 98 98 94*  CO2 30 29 35*  GLUCOSE 106* 109* 138*  BUN 6 6 11   CREATININE 1.00 0.72 0.62  CALCIUM 7.9* 8.0* 9.0   Estimated Creatinine Clearance: 72.9 mL/min (by C-G formula based on Cr of 0.62).   LIVER  Recent Labs Lab 04/22/14 0403  AST 64*  ALT 21  ALKPHOS 69  BILITOT 0.8  PROT 5.4*  ALBUMIN 2.8*   Dg Chest 2 View  04/28/2014   CLINICAL DATA:  Shortness of breath, sore chest post lung surgery. History of lung cancer.  EXAM: CHEST  2 VIEW  COMPARISON:  04/27/2014  FINDINGS: Right IJ central venous catheter unchanged. Lungs are adequately inflated and demonstrate no change in a small right apical pneumothorax. Interval worsening of mild perihilar opacification suggesting worsening vascular congestion and less likely atelectasis or infection. Cardiomediastinal silhouette and remainder of the exam is unchanged.  IMPRESSION: Mild worsening perihilar opacification likely vascular congestion and less likely atelectasis or infection.  Stable small right apical pneumothorax.  Right IJ central venous catheter unchanged.   Electronically Signed   By: Marin Olp M.D.   On: 04/28/2014 08:26   Dg Chest Port 1 View  04/27/2014  CLINICAL DATA:  Right chest tube removal.  EXAM: PORTABLE CHEST - 1 VIEW  COMPARISON:  04/27/2014  FINDINGS: Right IJ central venous catheter tip projects over the superior cavoatrial junction. Monitoring leads overlie the patient. Stable cardiac and mediastinal contours. Slight interval improvement in bilateral mid and lower lung heterogeneous pulmonary opacities. Interval removal right chest tube. Grossly stable to minimally increased right apical pneumothorax.  IMPRESSION: Grossly stable to mildly increased right apical  pneumothorax status post interval removal right chest tube.  Slight interval improvement bilateral interstitial edema.  These results will be called to the ordering clinician or representative by the Radiologist Assistant, and communication documented in the PACS or zVision Dashboard.   Electronically Signed   By: Lovey Newcomer M.D.   On: 04/27/2014 18:30   Dg Chest Port 1 View  04/27/2014   CLINICAL DATA:  Pulmonary edema.  EXAM: PORTABLE CHEST - 1 VIEW  COMPARISON:  April 26, 2014  FINDINGS: Chest tube remains on the right, although the tip of the right chest tube is more inferiorly position compared to 1 day prior. There is a small right apical pneumothorax which appears essentially stable compared to 1 day prior. No tension component. Central catheter tip is in the superior vena cava, stable. There remains generalized interstitial pulmonary edema. There is no appreciable airspace consolidation. Heart is mildly enlarged with mild pulmonary venous hypertension. No adenopathy.  IMPRESSION: Small right apical pneumothorax with chest tube on the right present. Stable interstitial edema consistent with a degree of congestive heart failure. No new opacity. No change in cardiac silhouette.   Electronically Signed   By: Lowella Grip III M.D.   On: 04/27/2014 07:39  PCXR review basilar atx obscuring right cardiac boarder. Persistent edema/ improved from prior film.   ASSESSMENT / PLAN:  Acute hypoxic respiratory failure with b/l pulmonary infiltrates. CXR 4/3 and 04/27/14 little change but clinically markedly better after lasix and steroids. She reports significant GERD with acid contents each morning . Possible this was mechanism for ALI >she continues to improve daily as of 4/5 Plan: - continue steroids and lasix; will taper steroids  - step up GERD Rx  - f/u echo - adjust oxygen to keep SpO2 > 92% - defer Abx - continue diuresis as tolerated per primary team - emphasis now on rehab/  mobilization   NSCLC (adenocarcinoma) s/p Rt upper lobectomy 04/20/14. Plan: - post op care, chest tube per primary team  Tobacco abuse. Plan: - smoking cessation  Patient seen an examined, continues to have crackles.  I reviewed the CXR myself.  Favors pulmonary edema.  EF 55-60% with grade one diastolic dysfunction noted.  Patient is not on home O2.  Will give two doses of lasix and potassium PO, check BMET again in AM.  Place orders for strict I/O.  Place orders to titrate O2 down for sat of 88-92%.  Hope is to discontinue use of O2 over the next 48 hours.  If unable to get off O2 then will need an ambulatory desaturation study inorder to qualify for what I would imagine will be temporary use of home O2 until inflammatory process passes.  Orders placed and will check again in AM.  Patient seen and examined, agree with above note.  I dictated the care and orders written for this patient under my direction.  Rush Farmer, MD [phone]  04/28/2014 9:08 AM

## 2014-04-28 NOTE — Progress Notes (Addendum)
**Note [name]-Identified via Obfuscation**        [address]       [employer] [address]             [phone]          8 Days Post-Op Procedure(s) (LRB): VIDEO BRONCHOSCOPY (N/A) VIDEO ASSISTED THORACOSCOPY  (Right) WEDGE RESECTION, RIGHT UPPER LOBE (Right) LOBECTOMY, RIGHT UPPER LOBE WITH PLACEMENT OF ONQ PAIN PUMP (Right) NODE DISSECTION (Right)  Subjective: Overall feeling much better.  Walked a lot yesterday, but still desats with exertion.   Objective: Vital signs in last 24 hours: Patient Vitals for the past 24 hrs:  BP Temp Temp src Pulse Resp SpO2  04/28/14 0730 (!) 157/74 mmHg - - 63 (!) 26 94 %  04/28/14 0500 - 98.9 F (37.2 C) Oral (!) 59 15 92 %  04/28/14 0105 (!) 164/74 mmHg 98.1 F (36.7 C) Oral 64 - 95 %  04/27/14 2140 (!) 146/68 mmHg - - 70 19 92 %  04/27/14 2004 - - - - - 96 %  04/27/14 1930 (!) 152/66 mmHg 97.9 F (36.6 C) Axillary 66 18 92 %  04/27/14 1925 - - - 68 (!) 21 93 %  04/27/14 1530 (!) 152/89 mmHg 97.8 F (36.6 C) Oral 71 14 93 %  04/27/14 1352 - - - - - 97 %  04/27/14 1232 118/84 mmHg 98.4 F (36.9 C) Oral 64 (!) 21 93 %  04/27/14 0845 - - - - - 93 %  04/27/14 0829 (!) 169/79 mmHg 97.5 F (36.4 C) Oral 69 20 93 %   Current Weight  04/26/14 165 lb 12.6 oz (75.2 kg)     Intake/Output from previous day: 04/04 0701 - 04/05 0700 In: 720 [P.O.:720] Out: 1406 [Urine:1225; Stool:1; Chest Tube:180]    PHYSICAL EXAM:  Heart: RRR Lungs: Few exp wheezes bilaterally Wound: Clean and dry   Lab Results: CBC:No results for input(s): WBC, HGB, HCT, PLT in the last 72 hours. BMET: No results for input(s): NA, K, CL, CO2, GLUCOSE, BUN, CREATININE, CALCIUM in the last 72 hours.  PT/INR: No results for input(s): LABPROT, INR in the last 72 hours.    Assessment/Plan: S/P Procedure(s) (LRB): VIDEO BRONCHOSCOPY (N/A) VIDEO ASSISTED THORACOSCOPY  (Right) WEDGE RESECTION, RIGHT UPPER LOBE (Right) LOBECTOMY, RIGHT UPPER LOBE WITH PLACEMENT OF ONQ PAIN PUMP  (Right) NODE DISSECTION (Right) CXR not done yet this am, will follow. Pulm- improving slowly, down to 3 L at rest, but requires up to 6 L with exertion. Steroids being weaned. Continue pulm toilet. For 2D echo today.   LOS: 8 days    Gabrielle [name],Gabrielle [name] 04/28/2014  Chest xray  Improving pa lat done today Chest tube out yesterday I have seen and examined Gabrielle [name] and agree with the above assessment  and plan.  Grace Isaac MD Beeper [phone] Office [phone] 04/28/2014 3:19 PM

## 2014-04-28 NOTE — Progress Notes (Signed)
  Echocardiogram 2D Echocardiogram has been performed.  Gabrielle [name] 04/28/2014, 9:28 AM

## 2014-04-29 ENCOUNTER — Inpatient Hospital Stay (HOSPITAL_COMMUNITY): Payer: 59

## 2014-04-29 DIAGNOSIS — J9383 Other pneumothorax: Secondary | ICD-10-CM

## 2014-04-29 DIAGNOSIS — R0902 Hypoxemia: Secondary | ICD-10-CM

## 2014-04-29 LAB — BASIC METABOLIC PANEL
Anion gap: 13 (ref 5–15)
BUN: 11 mg/dL (ref 6–23)
CO2: 34 mmol/L — ABNORMAL HIGH (ref 19–32)
CREATININE: 0.7 mg/dL (ref 0.50–1.10)
Calcium: 9 mg/dL (ref 8.4–10.5)
Chloride: 86 mmol/L — ABNORMAL LOW (ref 96–112)
GFR calc non Af Amer: >90 mL/min (ref >90)
Glucose, Bld: 89 mg/dL (ref 70–99)
Potassium: 3.8 mmol/L (ref 3.5–5.1)
Sodium: 133 mmol/L — ABNORMAL LOW (ref 135–145)

## 2014-04-29 LAB — CBC
HCT: 30.4 % — ABNORMAL LOW (ref 36.0–46.0)
HEMOGLOBIN: 10.6 g/dL — AB (ref 12.0–15.0)
MCH: 32.8 pg (ref 26.0–34.0)
MCHC: 34.9 g/dL (ref 30.0–36.0)
MCV: 94.1 fL (ref 78.0–100.0)
Platelets: 355 10*3/uL (ref 150–400)
RBC: 3.23 MIL/uL — ABNORMAL LOW (ref 3.87–5.11)
RDW: 13.3 % (ref 11.5–15.5)
WBC: 7.2 10*3/uL (ref 4.0–10.5)

## 2014-04-29 LAB — MAGNESIUM: MAGNESIUM: 1.7 mg/dL (ref 1.5–2.5)

## 2014-04-29 LAB — PHOSPHORUS: PHOSPHORUS: 2.7 mg/dL (ref 2.3–4.6)

## 2014-04-29 MED ORDER — POTASSIUM CHLORIDE CRYS ER 20 MEQ PO TBCR
20.0000 meq | EXTENDED_RELEASE_TABLET | Freq: Three times a day (TID) | ORAL | Status: AC
  Administered 2014-04-29 (×2): 20 meq via ORAL
  Filled 2014-04-29 (×2): qty 1

## 2014-04-29 MED ORDER — NICOTINE 14 MG/24HR TD PT24
14.0000 mg | MEDICATED_PATCH | Freq: Every day | TRANSDERMAL | Status: DC
  Administered 2014-04-29 – 2014-05-02 (×4): 14 mg via TRANSDERMAL
  Filled 2014-04-29 (×4): qty 1

## 2014-04-29 MED ORDER — FUROSEMIDE 10 MG/ML IJ SOLN
40.0000 mg | Freq: Three times a day (TID) | INTRAMUSCULAR | Status: AC
  Administered 2014-04-29 (×2): 40 mg via INTRAVENOUS
  Filled 2014-04-29 (×2): qty 4

## 2014-04-29 MED ORDER — ACETAMINOPHEN 325 MG PO TABS
650.0000 mg | ORAL_TABLET | Freq: Four times a day (QID) | ORAL | Status: DC | PRN
  Administered 2014-04-30 – 2014-05-02 (×4): 650 mg via ORAL
  Filled 2014-04-29 (×4): qty 2

## 2014-04-29 NOTE — Progress Notes (Signed)
Name: Gabrielle [name] MRN: [id number] DOB: 02-09-1955    ADMISSION DATE:  04/20/2014 CONSULTATION DATE:  04/23/2014  REFERRING MD :  Dr. Servando Snare  CHIEF COMPLAINT:  Short of breath  BRIEF PATIENT DESCRIPTION:  59 yo female smoker with RUL nodule and hx of breast cancer had Rt upper lobectomy on 04/20/14.  Developed progressive dyspnea, hypoxia, and progressive pulmonary infiltrates post-op and PCCM consulted.  STUDIES:  PET scan 1/00/71 >> hypermetabolic RUL nodule 1.4 x 1.3 cm PFT 04/07/14 >> FEV1 1.91 (85%), FEV1% 73, TLC 4.22 (98%), DLCO 56% Echo 04/28/14 >>  EF 55-60%. Grade 1 diastolic dysfunction.   LINES/TUBES: Rt IJ CVL 3/28 >> Rt chest tube 3/28 >> 4/4   SIGNIFICANT EVENTS  3/28 Admit, Rt upper lobectomy 3/31 Start trial of steroids 4/3 - Obviously feeling better. Cough more productive. She asks if "any of this is due to my chickens" 4/6 - Reports breathing is "much better than before"     SUBJECTIVE/OVERNIGHT/INTERVAL HX No distress. Using less O2; pain tolerable. Using IS and ambulating. Reports desating to 70-80s with increased activity. Verbalizes anxiety about getting discharged with home 02 and desaturating and "dying" at home.   VITAL SIGNS: Temp:  [97.1 F (36.2 C)-98.8 F (37.1 C)] 98.4 F (36.9 C) (04/06 0700) Pulse Rate:  [65-82] 65 (04/06 0740) Resp:  [17-26] 19 (04/06 0740) BP: (120-154)/(63-71) 153/69 mmHg (04/06 0740) SpO2:  [88 %-99 %] 94 % (04/06 0740) 2 liters  Intake/Output Summary (Last 24 hours) at 04/29/14 0939 Last data filed at 04/29/14 0630  Gross per 24 hour  Intake   1170 ml  Output   2050 ml  Net   -880 ml   PHYSICAL EXAMINATION: General: up in chair. No distress.  Neuro:  Alert, normal strength, CN intact HEENT:  Wears glasses, nasal cannula 02 Cardiovascular:  Regular, RRR Lungs: unlabored. Rales right posterior base. No accessory muscle. Right surgical dressing intact. Surgical site bruising noted.  Abdomen:  Soft, non  tender Musculoskeletal:  No edema, no joint swelling. Distal pulses intact.  Skin:  No rashes   PULMONARY No results for input(s): PHART, PCO2ART, PO2ART, HCO3, TCO2, O2SAT in the last 168 hours.  Invalid input(s): PCO2, PO2  CBC  Recent Labs Lab 04/23/14 0540 04/25/14 0545 04/29/14 0624  HGB 9.0* 9.6* 10.6*  HCT 26.8* 26.9* 30.4*  WBC 9.0 9.6 7.2  PLT 144* 193 355   CHEMISTRY  Recent Labs Lab 04/23/14 0540 04/25/14 0545 04/29/14 0624  NA 133* 132* 133*  K 4.1 4.0 3.8  CL 98 94* 86*  CO2 29 35* 34*  GLUCOSE 109* 138* 89  BUN 6 11 11   CREATININE 0.72 0.62 0.70  CALCIUM 8.0* 9.0 9.0  MG  --   --  1.7  PHOS  --   --  2.7   Estimated Creatinine Clearance: 72.9 mL/min (by C-G formula based on Cr of 0.7).   LIVER No results for input(s): AST, ALT, ALKPHOS, BILITOT, PROT, ALBUMIN, INR in the last 168 hours. Dg Chest 2 View  04/29/2014   CLINICAL DATA:  Cough.  Pneumonia.  EXAM: CHEST  2 VIEW  COMPARISON:  PA and lateral chest 04/28/2014. Single view is 04/27/2014.  FINDINGS: Right IJ catheter remains in place, unchanged. Small right pneumothorax seen on the prior examination has increased in size as and is now estimated at 15%. Right middle lobe airspace disease is unchanged. Prominence of the pulmonary interstitium is again seen. No pleural effusion is identified. Heart size is  normal.  IMPRESSION: Some increase the size of a small right pneumothorax now estimated at 15%.  No change in right middle lobe airspace disease.   Electronically Signed   By: Inge Rise M.D.   On: 04/29/2014 07:08   Dg Chest 2 View  04/28/2014   CLINICAL DATA:  Shortness of breath, sore chest post lung surgery. History of lung cancer.  EXAM: CHEST  2 VIEW  COMPARISON:  04/27/2014  FINDINGS: Right IJ central venous catheter unchanged. Lungs are adequately inflated and demonstrate no change in a small right apical pneumothorax. Interval worsening of mild perihilar opacification suggesting  worsening vascular congestion and less likely atelectasis or infection. Cardiomediastinal silhouette and remainder of the exam is unchanged.  IMPRESSION: Mild worsening perihilar opacification likely vascular congestion and less likely atelectasis or infection.  Stable small right apical pneumothorax.  Right IJ central venous catheter unchanged.   Electronically Signed   By: Marin Olp M.D.   On: 04/28/2014 08:26   Dg Chest Port 1 View  04/27/2014   CLINICAL DATA:  Right chest tube removal.  EXAM: PORTABLE CHEST - 1 VIEW  COMPARISON:  04/27/2014  FINDINGS: Right IJ central venous catheter tip projects over the superior cavoatrial junction. Monitoring leads overlie the patient. Stable cardiac and mediastinal contours. Slight interval improvement in bilateral mid and lower lung heterogeneous pulmonary opacities. Interval removal right chest tube. Grossly stable to minimally increased right apical pneumothorax.  IMPRESSION: Grossly stable to mildly increased right apical pneumothorax status post interval removal right chest tube.  Slight interval improvement bilateral interstitial edema.  These results will be called to the ordering clinician or representative by the Radiologist Assistant, and communication documented in the PACS or zVision Dashboard.   Electronically Signed   By: Lovey Newcomer M.D.   On: 04/27/2014 18:30   CXR 4/6 little change with exception of increase in small right pneumothorax, now estimated at 15%   ASSESSMENT / PLAN:  Acute hypoxic respiratory failure with b/l pulmonary infiltrates. Small but worsening larger right apical PTX  >she continues to improve daily as of 4/6 but still desaturates w/ activity per pt report.   Plan: - continue lasix for another 2 doses.  - cont to taper steroids  - continue GERD Rx - adjust oxygen to keep SpO2 > 92%. Will likely need home O2 for a while as inflammatory component of lung injury resolves.  - emphasis now on rehab/ mobilization - f/u  CXR per CVTS, hope she won't need new CT.   NSCLC (adenocarcinoma) s/p Rt upper lobectomy 04/20/14. Plan: - post op care per surg  Tobacco abuse. Plan: - smoking cessation  Erick Colace ACNP-BC [place] Pager # [phone] OR # [phone] if no answer  Reports feeling better with diureses but I reviewed the CXR myself and reveals worsening of PTX on the right.  Echo also reviewed, EF 55-60% with grade one diastolic dysfunction.  I see no reason for placement of a chest tube.  Pulmonary edema is still an active issue and will order additional lasix 40 mg IV q8 x2 doses.  Replace electrolytes preemptively and f/u CXR in AM to follow up on PTX.  Maintain in SDU and will f/u in AM.  Patient seen and examined, agree with above note.  I dictated the care and orders written for this patient under my direction.  Rush Farmer, MD [phone]

## 2014-04-29 NOTE — Progress Notes (Signed)
Pt c/o hearing air come out of chest tube site when she coughs. Dr. Servando Snare saw pt around 1200 and stated that pt had air and drainage come out of wound when she coughed. Instructed to put xeroform gauze and a pressure dressing over anterior site. Will continue to monitor pt.

## 2014-04-29 NOTE — Progress Notes (Addendum)
[address]       [place] [address]             [phone]          9 Days Post-Op Procedure(s) (LRB): VIDEO BRONCHOSCOPY (N/A) VIDEO ASSISTED THORACOSCOPY  (Right) WEDGE RESECTION, RIGHT UPPER LOBE (Right) LOBECTOMY, RIGHT UPPER LOBE WITH PLACEMENT OF ONQ PAIN PUMP (Right) NODE DISSECTION (Right)  Subjective: Feels well, down to 1.5L at rest.  Still desats with exertion. C/o headache this am.   Objective: Vital signs in last 24 hours: Patient Vitals for the past 24 hrs:  BP Temp Temp src Pulse Resp SpO2  04/29/14 0700 - 98.4 F (36.9 C) Oral - - -  04/29/14 0630 (!) 149/67 mmHg 98.8 F (37.1 C) Axillary 75 (!) 22 (!) 88 %  04/29/14 0225 (!) 147/71 mmHg 97.1 F (36.2 C) Oral 71 (!) 26 99 %  04/28/14 2130 (!) 154/65 mmHg - - 82 (!) 22 92 %  04/28/14 2029 - - - - - 98 %  04/28/14 1958 128/71 mmHg 98.3 F (36.8 C) Axillary 75 18 92 %  04/28/14 1657 - - - 76 17 90 %  04/28/14 1630 - - - 80 18 (!) 89 %  04/28/14 1541 - - - 77 (!) 21 92 %  04/28/14 1530 - - - 77 17 91 %  04/28/14 1523 120/66 mmHg 98 F (36.7 C) Oral 79 19 (!) 89 %  04/28/14 1423 - - - - - 98 %  04/28/14 1151 - 97.9 F (36.6 C) Oral - - -  04/28/14 1150 - - - 73 19 90 %  04/28/14 1135 121/63 mmHg - - 71 17 91 %  04/28/14 1015 - - - 72 18 94 %  04/28/14 0932 - - - - - 93 %  04/28/14 0923 - - - 66 (!) 23 93 %   Current Weight  04/26/14 165 lb 12.6 oz (75.2 kg)     Intake/Output from previous day: 04/05 0701 - 04/06 0700 In: 1170 [P.O.:1170] Out: 2050 [Urine:2050]    PHYSICAL EXAM:  Heart: RRR Lungs: Few exp wheezes, but sounds much clearer today Wound: Clean and dry    Lab Results: CBC: Recent Labs  04/29/14 0624  WBC 7.2  HGB 10.6*  HCT 30.4*  PLT 355   BMET:  Recent Labs  04/29/14 0624  NA 133*  K 3.8  CL 86*  CO2 34*  GLUCOSE 89  BUN 11  CREATININE 0.70  CALCIUM 9.0    PT/INR: No results for input(s): LABPROT, INR in the last 72  hours.  CXR: FINDINGS: Right IJ catheter remains in place, unchanged. Small right pneumothorax seen on the prior examination has increased in size as and is now estimated at 15%. Right middle lobe airspace disease is unchanged. Prominence of the pulmonary interstitium is again seen. No pleural effusion is identified. Heart size is normal.  IMPRESSION: Some increase the size of a small right pneumothorax now estimated at 15%.  No change in right middle lobe airspace disease.  Echo (4/5):  ------------------------------------------------------------------- Study Conclusions  - Left ventricle: The cavity size was normal. Systolic function was normal. The estimated ejection fraction was in the range of 55% to 60%. Wall motion was normal; there were no regional wall motion abnormalities. There was an increased relative contribution of atrial contraction to ventricular filling. Doppler parameters are consistent with abnormal left ventricular relaxation (grade 1 diastolic dysfunction). - Aortic valve:  Moderate diffuse thickening and calcification, consistent with sclerosis. - Mitral valve: Calcified annulus. There was mild regurgitation.   Assessment/Plan: S/P Procedure(s) (LRB): VIDEO BRONCHOSCOPY (N/A) VIDEO ASSISTED THORACOSCOPY  (Right) WEDGE RESECTION, RIGHT UPPER LOBE (Right) LOBECTOMY, RIGHT UPPER LOBE WITH PLACEMENT OF ONQ PAIN PUMP (Right) NODE DISSECTION (Right) Pulm- improving overall with steroids, diuresis.  Continue care per pulm/CCM. Will likely need home O2 so will arrange. Small increase in apical ptx/space on CXR today- will follow up CXR in am. Possibly home in next 1-2 days on O2 if she remains stable.   LOS: 9 days    [name],[name] 04/29/2014  I have seen and examined Gabrielle [name] and agree with the above assessment  and plan.  Grace Isaac MD Beeper [phone] Office [phone] 04/29/2014 11:13 AM

## 2014-04-29 NOTE — Progress Notes (Signed)
Pt on 2L/[place] at rest. Ambulated pt on RA and O2 sats dropped to 75%. Placed pt on 2L/[place] and waited for pt to recover. Came up to 92%. Continued ambulating and pt dropped to 82%, turned pt up to 3L/[place]. Resumed ambulating again and O2 sats dropped again to 83%. Turned O2 up to 4L/[place]. Pt sats got as low as 86% but recovered on its own within a few seconds back up to 93% where it stayed for the most part.

## 2014-04-30 ENCOUNTER — Inpatient Hospital Stay (HOSPITAL_COMMUNITY): Payer: 59

## 2014-04-30 DIAGNOSIS — J939 Pneumothorax, unspecified: Secondary | ICD-10-CM

## 2014-04-30 LAB — BASIC METABOLIC PANEL
ANION GAP: 9 (ref 5–15)
BUN: 12 mg/dL (ref 6–23)
CALCIUM: 8.9 mg/dL (ref 8.4–10.5)
CO2: 33 mmol/L — ABNORMAL HIGH (ref 19–32)
Chloride: 89 mmol/L — ABNORMAL LOW (ref 96–112)
Creatinine, Ser: 0.7 mg/dL (ref 0.50–1.10)
GFR calc Af Amer: >90 mL/min (ref >90)
GLUCOSE: 87 mg/dL (ref 70–99)
Potassium: 3.8 mmol/L (ref 3.5–5.1)
Sodium: 131 mmol/L — ABNORMAL LOW (ref 135–145)

## 2014-04-30 MED ORDER — FUROSEMIDE 10 MG/ML IJ SOLN
40.0000 mg | Freq: Three times a day (TID) | INTRAMUSCULAR | Status: AC
  Administered 2014-04-30 (×2): 40 mg via INTRAVENOUS
  Filled 2014-04-30 (×2): qty 4

## 2014-04-30 MED ORDER — POTASSIUM CHLORIDE CRYS ER 20 MEQ PO TBCR
40.0000 meq | EXTENDED_RELEASE_TABLET | Freq: Once | ORAL | Status: AC
  Administered 2014-04-30: 40 meq via ORAL
  Filled 2014-04-30: qty 2

## 2014-04-30 NOTE — Progress Notes (Signed)
Pt ambulated about 300 feet (1 lap) on 2L O2 and O2 sats dropped to 79%. O2 increased to 4L and O2 sats increased to 91%. O2 sats remained in the 90s while the pt ambulated her second lap.

## 2014-04-30 NOTE — Progress Notes (Signed)
Name: Gabrielle [name] MRN: [id number] DOB: 12-13-1955    ADMISSION DATE:  04/20/2014 CONSULTATION DATE:  04/23/2014  REFERRING MD :  Dr. Servando Snare  CHIEF COMPLAINT:  Short of breath  BRIEF PATIENT DESCRIPTION:  59 yo female smoker with RUL nodule and hx of breast cancer had Rt upper lobectomy on 04/20/14.  Developed progressive dyspnea, hypoxia, and progressive pulmonary infiltrates post-op and PCCM consulted.  STUDIES:  PET scan 3/61/44 >> hypermetabolic RUL nodule 1.4 x 1.3 cm PFT 04/07/14 >> FEV1 1.91 (85%), FEV1% 73, TLC 4.22 (98%), DLCO 56% Echo 04/28/14 >>  EF 55-60%. Grade 1 diastolic dysfunction.   LINES/TUBES: Rt IJ CVL 3/28 >> Rt chest tube 3/28 >> 4/4   SIGNIFICANT EVENTS  3/28 Admit, Rt upper lobectomy 3/31 Start trial of steroids 4/3 - Obviously feeling better. Cough more productive. She asks if "any of this is due to my chickens" 4/6 - Reports breathing is "much better than before"     SUBJECTIVE/OVERNIGHT/INTERVAL HX No distress. Using less O2; pain tolerable. Using IS and ambulating. Reports desating to 70-80s with increased activity. Verbalizes anxiety about getting discharged with home 02 and desaturating and "dying" at home.   VITAL SIGNS: Temp:  [97.5 F (36.4 C)-98.7 F (37.1 C)] 98.6 F (37 C) (04/07 0700) Pulse Rate:  [63-84] 73 (04/07 0827) Resp:  [16-22] 22 (04/07 0827) BP: (96-119)/(51-68) 100/57 mmHg (04/07 0731) SpO2:  [88 %-98 %] 91 % (04/07 0845) 2 liters  Intake/Output Summary (Last 24 hours) at 04/30/14 1127 Last data filed at 04/30/14 0552  Gross per 24 hour  Intake    600 ml  Output   1900 ml  Net  -1300 ml   PHYSICAL EXAMINATION: General: up in chair. No distress.  Neuro:  Alert, normal strength, CN intact HEENT:  Wears glasses, nasal cannula 02 Cardiovascular:  Regular, RRR Lungs: unlabored. Rales right posterior base. No accessory muscle. Right surgical dressing intact. Surgical site bruising noted.  Abdomen:  Soft, non  tender Musculoskeletal:  No edema, no joint swelling. Distal pulses intact.  Skin:  No rashes   PULMONARY No results for input(s): PHART, PCO2ART, PO2ART, HCO3, TCO2, O2SAT in the last 168 hours.  Invalid input(s): PCO2, PO2  CBC  Recent Labs Lab 04/25/14 0545 04/29/14 0624  HGB 9.6* 10.6*  HCT 26.9* 30.4*  WBC 9.6 7.2  PLT 193 355   CHEMISTRY  Recent Labs Lab 04/25/14 0545 04/29/14 0624 04/30/14 0544  NA 132* 133* 131*  K 4.0 3.8 3.8  CL 94* 86* 89*  CO2 35* 34* 33*  GLUCOSE 138* 89 87  BUN 11 11 12   CREATININE 0.62 0.70 0.70  CALCIUM 9.0 9.0 8.9  MG  --  1.7  --   PHOS  --  2.7  --    Estimated Creatinine Clearance: 72.9 mL/min (by C-G formula based on Cr of 0.7).   LIVER No results for input(s): AST, ALT, ALKPHOS, BILITOT, PROT, ALBUMIN, INR in the last 168 hours. Dg Chest 2 View  04/30/2014   CLINICAL DATA:  Lung cancer.  EXAM: CHEST  2 VIEW  COMPARISON:  04/29/2014 .  FINDINGS: Right IJ line in stable position. Mediastinum hilar structures are normal. Heart size stable. Stable right apical pneumothorax. Persistent right middle lobe atelectasis and/or infiltrate and prominent interstitial markings are again noted. No pleural effusion.  IMPRESSION: 1. Right IJ line in stable position. 2. Stable small right apical pneumothorax. 3. Persistent unchanged right middle lobe atelectasis and/or infiltrate. Stable mild prominence of  pulmonary interstitium unchanged.   Electronically Signed   By: Marcello Moores  Register   On: 04/30/2014 08:04   Dg Chest 2 View  04/29/2014   CLINICAL DATA:  Cough.  Pneumonia.  EXAM: CHEST  2 VIEW  COMPARISON:  PA and lateral chest 04/28/2014. Single view is 04/27/2014.  FINDINGS: Right IJ catheter remains in place, unchanged. Small right pneumothorax seen on the prior examination has increased in size as and is now estimated at 15%. Right middle lobe airspace disease is unchanged. Prominence of the pulmonary interstitium is again seen. No pleural  effusion is identified. Heart size is normal.  IMPRESSION: Some increase the size of a small right pneumothorax now estimated at 15%.  No change in right middle lobe airspace disease.   Electronically Signed   By: Inge Rise M.D.   On: 04/29/2014 07:08   CXR 4/6 little change with exception of increase in small right pneumothorax, now estimated at 15%   ASSESSMENT / PLAN:  Acute hypoxic respiratory failure with b/l pulmonary infiltrates. Small but worsening larger right apical PTX noted. I reviewed the CXR myself, seems slightly larger PTX but overall the CXR looks better.  >Desaturates with conversation on RA down to 85 when I was bedside with her.  Plan: - Continue lasix for another 2 doses.  - Cont to taper steroids  - Continue GERD Rx - Adjust oxygen to keep SpO2 > 92%. Will likely need home O2 for a while as inflammatory component of lung injury resolves.  - Emphasis now on rehab/ mobilization - f/u CXR per CVTS, hope she won't need new CT.  - Ambulatory desaturation study.  NSCLC (adenocarcinoma) s/p Rt upper lobectomy 04/20/14. Plan: - post op care per surg  Tobacco abuse. Plan: - smoking cessation  Rush Farmer, M.D. [facility] Pulmonary/Critical Care Medicine. Pager: [phone]. After hours pager: [phone].

## 2014-04-30 NOTE — Progress Notes (Addendum)
       [address]       [place] [address]             [phone]          10 Days Post-Op Procedure(s) (LRB): VIDEO BRONCHOSCOPY (N/A) VIDEO ASSISTED THORACOSCOPY  (Right) WEDGE RESECTION, RIGHT UPPER LOBE (Right) LOBECTOMY, RIGHT UPPER LOBE WITH PLACEMENT OF ONQ PAIN PUMP (Right) NODE DISSECTION (Right)  Subjective: Comfortable, no complaints this am.  Was having serous drainage from CT site overnight, but improved this am.   Objective: Vital signs in last 24 hours: Patient Vitals for the past 24 hrs:  BP Temp Temp src Pulse Resp SpO2  04/30/14 0536 112/68 mmHg 97.8 F (36.6 C) Axillary 63 16 91 %  04/29/14 2352 (!) 104/56 mmHg - - 70 16 91 %  04/29/14 2350 - 98.7 F (37.1 C) Oral - - -  04/29/14 2220 119/63 mmHg - - 84 - -  04/29/14 1735 - - - 71 19 93 %  04/29/14 1525 (!) 96/51 mmHg - - 68 19 94 %  04/29/14 1500 - 98.7 F (37.1 C) Oral - - -  04/29/14 1449 - - - - - 97 %  04/29/14 1130 - 97.5 F (36.4 C) Axillary - - -   Current Weight  04/26/14 165 lb 12.6 oz (75.2 kg)     Intake/Output from previous day: 04/06 0701 - 04/07 0700 In: 900 [P.O.:900] Out: 1900 [Urine:1900]    PHYSICAL EXAM:  Heart: RRR Lungs: Clear Wound: Clean and dry, CT site with small amount serous drainage when coughing     Lab Results: CBC: Recent Labs  04/29/14 0624  WBC 7.2  HGB 10.6*  HCT 30.4*  PLT 355   BMET:  Recent Labs  04/29/14 0624 04/30/14 0544  NA 133* 131*  K 3.8 3.8  CL 86* 89*  CO2 34* 33*  GLUCOSE 89 87  BUN 11 12  CREATININE 0.70 0.70  CALCIUM 9.0 8.9    PT/INR: No results for input(s): LABPROT, INR in the last 72 hours.    Assessment/Plan: S/P Procedure(s) (LRB): VIDEO BRONCHOSCOPY (N/A) VIDEO ASSISTED THORACOSCOPY  (Right) WEDGE RESECTION, RIGHT UPPER LOBE (Right) LOBECTOMY, RIGHT UPPER LOBE WITH PLACEMENT OF ONQ PAIN PUMP (Right) NODE DISSECTION (Right) CXR with stable and slightly improved  pneumothorax/space.  Otherwise, CXR improved overall. Pulm- down to 1 L O2 at rest, but desats with ambulation.  Will arrange home O2. CT site with small amount serous drainage with cough.  Continue to monitor. Hopefully home in am if she remains stable.   LOS: 10 days    [name],[name] 04/30/2014  Plan d/c tomorrow if continues to improve I have seen and examined Gabrielle [name] and agree with the above assessment  and plan.  Grace Isaac MD Beeper [phone] Office [phone] 04/30/2014 12:34 PM

## 2014-04-30 NOTE — Progress Notes (Signed)
UR COMPLETED  

## 2014-05-01 ENCOUNTER — Inpatient Hospital Stay (HOSPITAL_COMMUNITY): Payer: 59

## 2014-05-01 DIAGNOSIS — S270XXD Traumatic pneumothorax, subsequent encounter: Secondary | ICD-10-CM

## 2014-05-01 LAB — MAGNESIUM: Magnesium: 1.6 mg/dL (ref 1.5–2.5)

## 2014-05-01 LAB — BASIC METABOLIC PANEL
ANION GAP: 9 (ref 5–15)
BUN: 13 mg/dL (ref 6–23)
CO2: 34 mmol/L — ABNORMAL HIGH (ref 19–32)
Calcium: 9 mg/dL (ref 8.4–10.5)
Chloride: 90 mmol/L — ABNORMAL LOW (ref 96–112)
Creatinine, Ser: 0.79 mg/dL (ref 0.50–1.10)
GFR calc non Af Amer: >90 mL/min (ref >90)
GLUCOSE: 96 mg/dL (ref 70–99)
Potassium: 3.9 mmol/L (ref 3.5–5.1)
SODIUM: 133 mmol/L — AB (ref 135–145)

## 2014-05-01 LAB — PHOSPHORUS: Phosphorus: 3.2 mg/dL (ref 2.3–4.6)

## 2014-05-01 LAB — CBC
HCT: 29.3 % — ABNORMAL LOW (ref 36.0–46.0)
HEMOGLOBIN: 10.6 g/dL — AB (ref 12.0–15.0)
MCH: 34 pg (ref 26.0–34.0)
MCHC: 36.2 g/dL — ABNORMAL HIGH (ref 30.0–36.0)
MCV: 93.9 fL (ref 78.0–100.0)
Platelets: 361 10*3/uL (ref 150–400)
RBC: 3.12 MIL/uL — ABNORMAL LOW (ref 3.87–5.11)
RDW: 13.6 % (ref 11.5–15.5)
WBC: 16.7 10*3/uL — AB (ref 4.0–10.5)

## 2014-05-01 MED ORDER — FUROSEMIDE 40 MG PO TABS
40.0000 mg | ORAL_TABLET | Freq: Every day | ORAL | Status: DC
  Administered 2014-05-01 – 2014-05-02 (×2): 40 mg via ORAL
  Filled 2014-05-01 (×2): qty 1

## 2014-05-01 MED ORDER — ALBUTEROL SULFATE (2.5 MG/3ML) 0.083% IN NEBU: 2.5000 mg | INHALATION_SOLUTION | Freq: Four times a day (QID) | RESPIRATORY_TRACT | Status: DC | PRN

## 2014-05-01 NOTE — Progress Notes (Signed)
Name: Gabrielle [name] MRN: [id number] DOB: 1955/05/10    ADMISSION DATE:  04/20/2014 CONSULTATION DATE:  04/23/2014  REFERRING MD :  Dr. Servando Snare  CHIEF COMPLAINT:  Short of breath  BRIEF PATIENT DESCRIPTION:  59 yo female smoker with RUL nodule and hx of breast cancer had Rt upper lobectomy on 04/20/14.  Developed progressive dyspnea, hypoxia, and progressive pulmonary infiltrates post-op and PCCM consulted.  STUDIES:  PET scan 6/62/94 >> hypermetabolic RUL nodule 1.4 x 1.3 cm PFT 04/07/14 >> FEV1 1.91 (85%), FEV1% 73, TLC 4.22 (98%), DLCO 56% Echo 04/28/14 >>  EF 55-60%. Grade 1 diastolic dysfunction.   LINES/TUBES: Rt IJ CVL 3/28 >> Rt chest tube 3/28 >> 4/4   SIGNIFICANT EVENTS  3/28 Admit, Rt upper lobectomy 3/31 Start trial of steroids 4/3 - Obviously feeling better. Cough more productive. She asks if "any of this is due to my chickens" 4/6 - Reports breathing is "much better than before"     SUBJECTIVE/OVERNIGHT/INTERVAL HX No distress. Using less O2; pain tolerable. Using IS and ambulating. Reports desating to 70-80s with increased activity. Verbalizes anxiety about getting discharged with home 02 and desaturating and "dying" at home.   VITAL SIGNS: Temp:  [97.6 F (36.4 C)-98.6 F (37 C)] 98.3 F (36.8 C) (04/08 0700) Pulse Rate:  [68-90] 72 (04/08 0422) Resp:  [17-24] 17 (04/08 0422) BP: (101-144)/(53-72) 122/69 mmHg (04/08 0422) SpO2:  [86 %-97 %] 91 % (04/08 0422) 2 liters  Intake/Output Summary (Last 24 hours) at 05/01/14 1108 Last data filed at 05/01/14 0400  Gross per 24 hour  Intake    721 ml  Output   1525 ml  Net   -804 ml   PHYSICAL EXAMINATION: General: up in chair. No distress.  Neuro:  Alert, normal strength, CN intact HEENT:  Wears glasses, nasal cannula 02 Cardiovascular:  Regular, RRR Lungs: unlabored. Rales right posterior base. No accessory muscle. Right surgical dressing intact. Surgical site bruising noted.  Abdomen:  Soft, non  tender Musculoskeletal:  No edema, no joint swelling. Distal pulses intact.  Skin:  No rashes   PULMONARY No results for input(s): PHART, PCO2ART, PO2ART, HCO3, TCO2, O2SAT in the last 168 hours.  Invalid input(s): PCO2, PO2  CBC  Recent Labs Lab 04/25/14 0545 04/29/14 0624 05/01/14 0426  HGB 9.6* 10.6* 10.6*  HCT 26.9* 30.4* 29.3*  WBC 9.6 7.2 16.7*  PLT 193 355 361   CHEMISTRY  Recent Labs Lab 04/25/14 0545 04/29/14 0624 04/30/14 0544 05/01/14 0426  NA 132* 133* 131* 133*  K 4.0 3.8 3.8 3.9  CL 94* 86* 89* 90*  CO2 35* 34* 33* 34*  GLUCOSE 138* 89 87 96  BUN 11 11 12 13   CREATININE 0.62 0.70 0.70 0.79  CALCIUM 9.0 9.0 8.9 9.0  MG  --  1.7  --  1.6  PHOS  --  2.7  --  3.2   Estimated Creatinine Clearance: 72.9 mL/min (by C-G formula based on Cr of 0.79).   LIVER No results for input(s): AST, ALT, ALKPHOS, BILITOT, PROT, ALBUMIN, INR in the last 168 hours. Dg Chest 2 View  05/01/2014   CLINICAL DATA:  Pneumonia  EXAM: CHEST  2 VIEW  COMPARISON:  Radiograph 04/30/2014  FINDINGS: Stable cardiac silhouette. Right central venous line is unchanged. There is a linear interstitial pattern suggesting interstitial edema not changed from prior. There is some more focal density at the right lung base over the right hemidiaphragm which is also unchanged. No pneumothorax. No pleural  fluid.  There is a right apical pneumothorax measuring 16 mm from the apical chest wall which is improved from 23 mm on prior.  IMPRESSION: 1. Right apical pneumothorax is slight reduced in volume. 2. Interstitial pulmonary edema pattern is similar to prior. 3. Density at the right lung base is not changed from 1 day prior but new from 04/27/2014 and likely represents atelectasis.   Electronically Signed   By: Suzy Bouchard M.D.   On: 05/01/2014 08:03   Dg Chest 2 View  04/30/2014   CLINICAL DATA:  Lung cancer.  EXAM: CHEST  2 VIEW  COMPARISON:  04/29/2014 .  FINDINGS: Right IJ line in stable  position. Mediastinum hilar structures are normal. Heart size stable. Stable right apical pneumothorax. Persistent right middle lobe atelectasis and/or infiltrate and prominent interstitial markings are again noted. No pleural effusion.  IMPRESSION: 1. Right IJ line in stable position. 2. Stable small right apical pneumothorax. 3. Persistent unchanged right middle lobe atelectasis and/or infiltrate. Stable mild prominence of pulmonary interstitium unchanged.   Electronically Signed   By: Marcello Moores  Register   On: 04/30/2014 08:04   CXR 4/8 I reviewed myself, improvement in PTX but pulmonary edema persists.   ASSESSMENT / PLAN:  Acute hypoxic respiratory failure with b/l pulmonary infiltrates. Small but worsening larger right apical PTX noted. I reviewed the CXR myself, seems slightly larger PTX but overall the CXR looks better.  >Desaturates with conversation on RA down to 85 when I was bedside with her.  Plan: - Place on PO lasix at this point, 40 mg daily. - Prednisone at 40 mg PO daily, will decrease to 30 daily x3 days then 20 daily x3 days then 10 daily x3 days then 5 daily x3 days then d/c.  - Continue GERD Rx - Adjust oxygen to keep SpO2 > 92%. Will likely need home O2 for a while as inflammatory component of lung injury resolves.  - Emphasis now on rehab/ mobilization - PTX improving, will need f/u CXR until resolution. - Ambulatory desaturation study for home O2.  NSCLC (adenocarcinoma) s/p Rt upper lobectomy 04/20/14. Plan: - post op care per surg  Tobacco abuse. Plan: - smoking cessation  CVTS is discharging patient today, will need home O2 and f/u CXR for PTX til resolution.  Please arrange F/U with PCCM as outpatient upon discharge.  Steroid taper as delineated above.  PCCM will sign off, please call back if needed.  Rush Farmer, M.D. [facility]. Pager: [phone]. After hours pager: [phone].

## 2014-05-01 NOTE — Progress Notes (Addendum)
[address]       [place] [address]             [phone]          11 Days Post-Op Procedure(s) (LRB): VIDEO BRONCHOSCOPY (N/A) VIDEO ASSISTED THORACOSCOPY  (Right) WEDGE RESECTION, RIGHT UPPER LOBE (Right) LOBECTOMY, RIGHT UPPER LOBE WITH PLACEMENT OF ONQ PAIN PUMP (Right) NODE DISSECTION (Right)  Subjective: Feels well today, able to cough up some phlegm overnight.  Walked multiple times yesterday.    Objective: Vital signs in last 24 hours: Patient Vitals for the past 24 hrs:  BP Temp Temp src Pulse Resp SpO2  05/01/14 0422 122/69 mmHg 98.1 F (36.7 C) Oral 72 17 91 %  05/01/14 0022 (!) 105/53 mmHg 97.6 F (36.4 C) Oral 68 18 96 %  04/30/14 2134 (!) 144/56 mmHg - - 85 - -  04/30/14 2130 - - - 90 (!) 24 (!) 86 %  04/30/14 2050 - - - - - 96 %  04/30/14 1950 109/72 mmHg 98.4 F (36.9 C) Oral - 18 90 %  04/30/14 1608 101/61 mmHg 98 F (36.7 C) Oral 73 19 94 %  04/30/14 1403 - - - - - 97 %  04/30/14 1200 - 98.6 F (37 C) Oral - - -  04/30/14 0845 - - - - - 91 %  04/30/14 0827 - - - 73 (!) 22 (!) 88 %   Current Weight  04/26/14 165 lb 12.6 oz (75.2 kg)     Intake/Output from previous day: 04/07 0701 - 04/08 0700 In: 1081 [P.O.:1081] Out: 1525 [Urine:1525]    PHYSICAL EXAM:  Heart: RRR Lungs: Scattered rhonchi on R Wound: Incision clean and dry. No active drainage from CT site, even with cough.     Lab Results: CBC: Recent Labs  04/29/14 0624 05/01/14 0426  WBC 7.2 16.7*  HGB 10.6* 10.6*  HCT 30.4* 29.3*  PLT 355 361   BMET:  Recent Labs  04/30/14 0544 05/01/14 0426  NA 131* 133*  K 3.8 3.9  CL 89* 90*  CO2 33* 34*  GLUCOSE 87 96  BUN 12 13  CREATININE 0.70 0.79  CALCIUM 8.9 9.0    PT/INR: No results for input(s): LABPROT, INR in the last 72 hours.  CXR: FINDINGS: Stable cardiac silhouette. Right central venous line is unchanged. There is a linear interstitial pattern suggesting interstitial  edema not changed from prior. There is some more focal density at the right lung base over the right hemidiaphragm which is also unchanged. No pneumothorax. No pleural fluid.  There is a right apical pneumothorax measuring 16 mm from the apical chest wall which is improved from 23 mm on prior.  IMPRESSION: 1. Right apical pneumothorax is slight reduced in volume. 2. Interstitial pulmonary edema pattern is similar to prior. 3. Density at the right lung base is not changed from 1 day prior but new from 04/27/2014 and likely represents atelectasis.   Assessment/Plan: S/P Procedure(s) (LRB): VIDEO BRONCHOSCOPY (N/A) VIDEO ASSISTED THORACOSCOPY  (Right) WEDGE RESECTION, RIGHT UPPER LOBE (Right) LOBECTOMY, RIGHT UPPER LOBE WITH PLACEMENT OF ONQ PAIN PUMP (Right) NODE DISSECTION (Right) Leukocytosis, no fever. Could be atelectasis vs related to previous steroid use, although now off steroids for several days.  Will repeat labs in am. Pulm- sats presently 93% on RA, but still desats with minimal exertion. Continue aggressive pulm toilet.  Home O2 arranged. CT site drained some overnight, but not actively draining.  Likely  from serous fluid in CT tract.  Continue to watch. Hopefully home soon if she continues to improve.   LOS: 11 days    [name],[name] 05/01/2014  No drainage from ct sites, no obvious infection in wounds Will d/c central line No fever Plan d/c tomorrow if chest xray ok and no fevers I have seen and examined Elvera Maria and agree with the above assessment  and plan.  Grace Isaac MD Beeper [phone] Office [phone] 05/01/2014 6:00 PM

## 2014-05-02 ENCOUNTER — Inpatient Hospital Stay (HOSPITAL_COMMUNITY): Payer: 59

## 2014-05-02 LAB — CBC
HCT: 29.8 % — ABNORMAL LOW (ref 36.0–46.0)
Hemoglobin: 10.6 g/dL — ABNORMAL LOW (ref 12.0–15.0)
MCH: 33.2 pg (ref 26.0–34.0)
MCHC: 35.6 g/dL (ref 30.0–36.0)
MCV: 93.4 fL (ref 78.0–100.0)
Platelets: 342 10*3/uL (ref 150–400)
RBC: 3.19 MIL/uL — AB (ref 3.87–5.11)
RDW: 13.5 % (ref 11.5–15.5)
WBC: 17 10*3/uL — ABNORMAL HIGH (ref 4.0–10.5)

## 2014-05-02 MED ORDER — PREDNISONE 10 MG PO TABS: ORAL_TABLET | ORAL | Status: DC

## 2014-05-02 MED ORDER — FUROSEMIDE 40 MG PO TABS: 40.0000 mg | ORAL_TABLET | Freq: Every day | ORAL | Status: DC

## 2014-05-02 MED ORDER — PREDNISONE 10 MG PO TABS: 10.0000 mg | ORAL_TABLET | Freq: Every day | ORAL | Status: DC

## 2014-05-02 MED ORDER — POTASSIUM CHLORIDE ER 20 MEQ PO TBCR: 20.0000 meq | EXTENDED_RELEASE_TABLET | Freq: Every day | ORAL | Status: DC

## 2014-05-02 MED ORDER — NICOTINE 14 MG/24HR TD PT24: 14.0000 mg | MEDICATED_PATCH | Freq: Every day | TRANSDERMAL | Status: DC

## 2014-05-02 MED ORDER — OXYCODONE HCL 5 MG PO TABS: 5.0000 mg | ORAL_TABLET | ORAL | Status: DC | PRN

## 2014-05-02 NOTE — Progress Notes (Signed)
Patient discharged home. Discharge instructions given with teach-back. All questions answered. Sutures from posterior chest tube incision site removed. Site shows no signs of infection. Patient tolerated well, uneventful. CCMD and ELink notified.

## 2014-05-02 NOTE — Discharge Instructions (Signed)
ACTIVITY:  1.Increase activity slowly. 2.Walk daily and increase frequency and duration as tolerates. 3.May walk up steps. 4.No lifting more than ten pounds for two weeks. 5.No driving for two weeks. 6.Avoid straining. 7.STOP any activity that causes chest pain, shortness of breath, dizziness,sweating,     or excessive weakness. 8.Continue with breathing exercises daily.  DIET:  Low fat, Low cholesterol diet   WOUND:  1.May shower. 2.Clean wounds with mild soap and water.  Call the office at [phone] if any problems arise. 3. May apply dry gauze and tape to chest tube site if continues to drain. Change daily.  Smoking Cessation Quitting smoking is important to your health and has many advantages. However, it is not always easy to quit since nicotine is a very addictive drug. Oftentimes, people try 3 times or more before being able to quit. This document explains the best ways for you to prepare to quit smoking. Quitting takes hard work and a lot of effort, but you can do it. ADVANTAGES OF QUITTING SMOKING  You will live longer, feel better, and live better.  Your body will feel the impact of quitting smoking almost immediately.  Within 20 minutes, blood pressure decreases. Your pulse returns to its normal level.  After 8 hours, carbon monoxide levels in the blood return to normal. Your oxygen level increases.  After 24 hours, the chance of having a heart attack starts to decrease. Your breath, hair, and body stop smelling like smoke.  After 48 hours, damaged nerve endings begin to recover. Your sense of taste and smell improve.  After 72 hours, the body is virtually free of nicotine. Your bronchial tubes relax and breathing becomes easier.  After 2 to 12 weeks, lungs can hold more air. Exercise becomes easier and circulation improves.  The risk of having a heart attack, stroke, cancer, or lung disease is greatly reduced.  After 1 year, the risk of coronary heart disease  is cut in half.  After 5 years, the risk of stroke falls to the same as a nonsmoker.  After 10 years, the risk of lung cancer is cut in half and the risk of other cancers decreases significantly.  After 15 years, the risk of coronary heart disease drops, usually to the level of a nonsmoker.  If you are pregnant, quitting smoking will improve your chances of having a healthy baby.  The people you live with, especially any children, will be healthier.  You will have extra money to spend on things other than cigarettes. QUESTIONS TO THINK ABOUT BEFORE ATTEMPTING TO QUIT You may want to talk about your answers with your health care provider.  Why do you want to quit?  If you tried to quit in the past, what helped and what did not?  What will be the most difficult situations for you after you quit? How will you plan to handle them?  Who can help you through the tough times? Your family? Friends? A health care provider?  What pleasures do you get from smoking? What ways can you still get pleasure if you quit? Here are some questions to ask your health care provider:  How can you help me to be successful at quitting?  What medicine do you think would be best for me and how should I take it?  What should I do if I need more help?  What is smoking withdrawal like? How can I get information on withdrawal? GET READY  Set a quit date.  Change your  environment by getting rid of all cigarettes, ashtrays, matches, and lighters in your home, car, or work. Do not let people smoke in your home.  Review your past attempts to quit. Think about what worked and what did not. GET SUPPORT AND ENCOURAGEMENT You have a better chance of being successful if you have help. You can get support in many ways.  Tell your family, friends, and coworkers that you are going to quit and need their support. Ask them not to smoke around you.  Get individual, group, or telephone counseling and support. Programs  are available at [employer] and health centers. Call your local health department for information about programs in your area.  Spiritual beliefs and practices may help some smokers quit.  Download a "quit meter" on your computer to keep track of quit statistics, such as how long you have gone without smoking, cigarettes not smoked, and money saved.  Get a self-help book about quitting smoking and staying off tobacco. [place] yourself from urges to smoke. Talk to someone, go for a walk, or occupy your time with a task.  Change your normal routine. Take a different route to work. Drink tea instead of coffee. Eat breakfast in a different place.  Reduce your stress. Take a hot bath, exercise, or read a book.  Plan something enjoyable to do every day. Reward yourself for not smoking.  Explore interactive web-based programs that specialize in helping you quit. GET MEDICINE AND USE IT CORRECTLY Medicines can help you stop smoking and decrease the urge to smoke. Combining medicine with the above behavioral methods and support can greatly increase your chances of successfully quitting smoking.  Nicotine replacement therapy helps deliver nicotine to your body without the negative effects and risks of smoking. Nicotine replacement therapy includes nicotine gum, lozenges, inhalers, nasal sprays, and skin patches. Some may be available over-the-counter and others require a prescription.  Antidepressant medicine helps people abstain from smoking, but how this works is unknown. This medicine is available by prescription.  Nicotinic receptor partial agonist medicine simulates the effect of nicotine in your brain. This medicine is available by prescription. Ask your health care provider for advice about which medicines to use and how to use them based on your health history. Your health care provider will tell you what side effects to look out for if you choose to be  on a medicine or therapy. Carefully read the information on the package. Do not use any other product containing nicotine while using a nicotine replacement product.  RELAPSE OR DIFFICULT SITUATIONS Most relapses occur within the first 3 months after quitting. Do not be discouraged if you start smoking again. Remember, most people try several times before finally quitting. You may have symptoms of withdrawal because your body is used to nicotine. You may crave cigarettes, be irritable, feel very hungry, cough often, get headaches, or have difficulty concentrating. The withdrawal symptoms are only temporary. They are strongest when you first quit, but they will go away within 10-14 days. To reduce the chances of relapse, try to:  Avoid drinking alcohol. Drinking lowers your chances of successfully quitting.  Reduce the amount of caffeine you consume. Once you quit smoking, the amount of caffeine in your body increases and can give you symptoms, such as a rapid heartbeat, sweating, and anxiety.  Avoid smokers because they can make you want to smoke.  Do not let weight gain distract you. Many smokers will gain weight when  they quit, usually less than 10 pounds. Eat a healthy diet and stay active. You can always lose the weight gained after you quit.  Find ways to improve your mood other than smoking. FOR MORE INFORMATION  www.smokefree.gov  Document Released: 01/03/2001 Document Revised: 05/26/2013 Document Reviewed: 04/20/2011 [facility] Patient Information 2015 [place], [place]. This information is not intended to replace advice given to you by your health care provider. Make sure you discuss any questions you have with your health care provider.  Thoracotomy, Care After Refer to this sheet in the next few weeks. These instructions provide you with information on caring for yourself after your procedure. Your health care provider may also give you more specific instructions. Your treatment has been  planned according to current medical practices, but problems sometimes occur. Call your health care provider if you have any problems or questions after your procedure. WHAT TO EXPECT AFTER YOUR PROCEDURE After your procedure, it is typical to have the following sensations:  You may feel pain at the incision site.  You may be constipated from the pain medicine given and the change in your level of activity.  You may feel extremely tired. HOME CARE INSTRUCTIONS  Take over-the-counter or prescription medicines for pain, discomfort, or fever only as directed by your health care provider. It is very important to take pain relieving medicine before your pain becomes severe. You will be able to breathe and cough more comfortably if your pain is well controlled.  Take deep breaths. Deep breathing helps to keep your lungs inflated and protects against a lung infection (pneumonia).  Cough frequently. Even though coughing may cause discomfort, coughing is important to clear mucus (phlegm) and expand your lungs. Coughing helps prevent pneumonia. If it hurts to cough, hold a pillow against your chest when you cough. This may help with the discomfort.  Continue to use an incentive spirometer as directed. The use of an incentive spirometer helps to keep your lungs inflated and protects against pneumonia.  Change the bandages over your incision as needed or as directed by your health care provider.  Remove the bandages over your chest tube site as directed by your health care provider.  Resume your normal diet as directed. It is important to have adequate protein, calories, vitamins, and minerals to promote healing.  Prevent constipation.  Eat high-fiber foods such as whole grain cereals and breads, brown rice, beans, and fresh fruits and vegetables.  Drink enough water and fluids to keep your urine clear or pale yellow. Avoid drinking beverages containing caffeine. Beverages containing caffeine can  cause dehydration and harden your stool.  Talk to your health care provider about taking a stool softener or laxative.  Avoid lifting until you are instructed otherwise.  Do not drive until directed by your health care provider.  Do not drive while taking pain medicines (narcotics).  Do not bathe, swim, or use a hot tub until directed by your health care provider. You may shower instead. Gently wash the area of your incision with water and soap as directed. Do not use anything else to clean your incision except as directed by your health care provider.  Do not use any tobacco products including cigarettes, chewing tobacco, or electronic cigarettes.  Avoid secondhand smoke.  Schedule an appointment for stitch (suture) or staple removal as directed.  Schedule and attend all follow-up visits as directed by your health care provider. It is important to keep all your appointments.  Participate in pulmonary rehabilitation as directed by  your health care provider.  Do not travel by airplane for 2 weeks after your chest tube is removed. SEEK MEDICAL CARE IF:  You are bleeding from your wounds.  Your heartbeat seems irregular.  You have redness, swelling, or increasing pain in the wounds.  There is pus coming from your wounds.  There is a bad smell coming from the wound or dressing.  You have a fever or chills.  You have nausea or are vomiting.  You have muscle aches. SEEK IMMEDIATE MEDICAL CARE IF:  You have a rash.  You have difficulty breathing.  You have a reaction or side effect to medicines given.  You have persistent nausea.  You have lightheadedness or feel faint.  You have shortness of breath or chest pain.  You have persistent pain. Document Released: 06/24/2010 Document Revised: 01/14/2013 Document Reviewed: 08/28/2012 [facility] Patient Information 2015 [place], [place]. This information is not intended to replace advice given to you by your health care provider.  Make sure you discuss any questions you have with your health care provider.

## 2014-05-02 NOTE — Progress Notes (Addendum)
      [address]       [place] [address]             [phone]       12 Days Post-Op Procedure(s) (LRB): VIDEO BRONCHOSCOPY (N/A) VIDEO ASSISTED THORACOSCOPY  (Right) WEDGE RESECTION, RIGHT UPPER LOBE (Right) LOBECTOMY, RIGHT UPPER LOBE WITH PLACEMENT OF ONQ PAIN PUMP (Right) NODE DISSECTION (Right)  Subjective: Patient would like to go home.  Objective: Vital signs in last 24 hours: Temp:  [97.8 F (36.6 C)-98.6 F (37 C)] 98.6 F (37 C) (04/08 2345) Pulse Rate:  [70-75] 74 (04/09 0905) Cardiac Rhythm:  [-] Normal sinus rhythm (04/09 0730) Resp:  [13-24] 24 (04/09 0905) BP: (98-123)/(57-62) 113/61 mmHg (04/09 0730) SpO2:  [77 %-97 %] 77 % (04/09 0905)     Intake/Output from previous day: 04/08 0701 - 04/09 0700 In: 840 [P.O.:840] Out: 1401 [Urine:1400; Stool:1]   Physical Exam:  Cardiovascular: RRR Pulmonary: Coarse on the right and clear on the left Abdomen: Soft, non tender, bowel sounds present. Extremities: Trace lower extremity edema. Wounds: Clean and dry.  No erythema or signs of infection. Dressing over previously draining chest tube site is mostly dry.  Lab Results: CBC: Recent Labs  05/01/14 0426 05/02/14 0243  WBC 16.7* 17.0*  HGB 10.6* 10.6*  HCT 29.3* 29.8*  PLT 361 342   BMET:  Recent Labs  04/30/14 0544 05/01/14 0426  NA 131* 133*  K 3.8 3.9  CL 89* 90*  CO2 33* 34*  GLUCOSE 87 96  BUN 12 13  CREATININE 0.70 0.79  CALCIUM 8.9 9.0    PT/INR: No results for input(s): LABPROT, INR in the last 72 hours. ABG:  INR: Will add last result for INR, ABG once components are confirmed Will add last 4 CBG results once components are confirmed  Assessment/Plan:  1. CV - SR in the 70's. On Lopressor 100 mg daily and 50 mg at night (as taken pre op). 2.  Pulmonary - On Prednisone taper per CCM. Continues to desat with exertion so will need oxygen at home upon discharge. CXR this am shows decreased right apical  pneumothorax. Encourage incentive spirometer 3. Volume overload-on Lasix 40 mg daily. Will continue for 5 days then stop. 4. Anemia-H and H stable at 10.6 and 29.8 5. WBC remain 17,000. She remains afebrile and is on Prednisone. No signs of wound infection. 6. As discussed with Dr. Cyndia Bent, will discharge home with oxygen.  ZIMMERMAN,DONIELLE MPA-C 05/02/2014,9:08 AM   Chart reviewed, patient examined, agree with above. She looks good and is afebrile. Leukocytosis is probably due to daily prednisone. CXR is stable. I think she can go home.

## 2014-05-02 NOTE — Progress Notes (Signed)
SATURATION QUALIFICATIONS: (This note is used to comply with regulatory documentation for home oxygen)  Patient Saturations on Room Air at Rest = 92%  Patient Saturations on Room Air while Ambulating = 88%  Patient Saturations on 2 Liters of oxygen while Ambulating = 94% Please briefly explain why patient needs home oxygen:SATURATION QUALIFICATIONS: (This note is used to comply with regulatory documentation for home oxygen)  Patient Saturations on Room Air at Rest = 92%  Patient Saturations on Room Air while Ambulating = 88%  Patient Saturations on 2 Liters of oxygen while Ambulating = 94%  Please briefly explain why patient needs home oxygen:

## 2014-05-03 NOTE — Care Management Note (Signed)
    Page 1 of 1   05/03/2014     6:43:45 PM CARE MANAGEMENT NOTE 05/03/2014  Patient:  KALESHA, IRVING   Account Number:  [id number]  Date Initiated:  05/02/2014  Documentation initiated by:  [facility]  Subjective/Objective Assessment:   adm: Short of breath     Action/Plan:   discharge planning   Anticipated DC Date:  05/02/2014   Anticipated DC Plan:  [place]  CM consult      [facility] Choice  HOME HEALTH   Choice offered to / List presented to:  C-1 Patient   DME arranged  [facility]      DME agency  [place] arranged  HH-1 RN      Status of service:  Completed, signed off Medicare Important Message given?   (If response is "NO", the following Medicare IM given date fields will be blank) Date Medicare IM given:   Medicare IM given by:   Date Additional Medicare IM given:   Additional Medicare IM given by:    Discharge Disposition:  [place]  Per UR Regulation:    If discussed at Long Length of Stay Meetings, dates discussed:    Comments:  05/02/14 09:15 CM received call pt to go home with O2.  Cm met with pt in room to offer choice of homehealth agency. Pt chooses [facility] to render [facility].  CM called [facility] DME rep, Jeneen Rinks to please deliver O2 to room prior to discharge.  CM called referral to [facility] rep, [place] for [facility].  NO other CM eeds were communicated.  Aura Dials, BSN, [place].

## 2014-05-04 DIAGNOSIS — C3411 Malignant neoplasm of upper lobe, right bronchus or lung: Secondary | ICD-10-CM | POA: Diagnosis not present

## 2014-05-20 ENCOUNTER — Other Ambulatory Visit: Payer: Self-pay | Admitting: Cardiothoracic Surgery

## 2014-05-20 DIAGNOSIS — C3411 Malignant neoplasm of upper lobe, right bronchus or lung: Secondary | ICD-10-CM

## 2014-05-21 ENCOUNTER — Ambulatory Visit (INDEPENDENT_AMBULATORY_CARE_PROVIDER_SITE_OTHER): Payer: 59 | Admitting: Adult Health

## 2014-05-21 ENCOUNTER — Encounter: Payer: Self-pay | Admitting: Cardiothoracic Surgery

## 2014-05-21 ENCOUNTER — Encounter: Payer: Self-pay | Admitting: Adult Health

## 2014-05-21 ENCOUNTER — Other Ambulatory Visit (INDEPENDENT_AMBULATORY_CARE_PROVIDER_SITE_OTHER): Payer: 59

## 2014-05-21 ENCOUNTER — Ambulatory Visit
Admission: RE | Admit: 2014-05-21 | Discharge: 2014-05-21 | Disposition: A | Discharge status: Home / Self Care | Payer: 59 | Source: Ambulatory Visit | Attending: Cardiothoracic Surgery | Admitting: Cardiothoracic Surgery

## 2014-05-21 ENCOUNTER — Ambulatory Visit (INDEPENDENT_AMBULATORY_CARE_PROVIDER_SITE_OTHER): Payer: Self-pay | Admitting: Cardiothoracic Surgery

## 2014-05-21 VITALS — BP 148/78 | HR 61 | Temp 97.9°F | Ht 61.0 in | Wt 160.6 lb

## 2014-05-21 VITALS — BP 178/90 | HR 67 | Resp 20 | Ht 61.0 in | Wt 165.0 lb

## 2014-05-21 DIAGNOSIS — J95821 Acute postprocedural respiratory failure: Secondary | ICD-10-CM | POA: Diagnosis not present

## 2014-05-21 DIAGNOSIS — C3411 Malignant neoplasm of upper lobe, right bronchus or lung: Secondary | ICD-10-CM

## 2014-05-21 DIAGNOSIS — J9610 Chronic respiratory failure, unspecified whether with hypoxia or hypercapnia: Secondary | ICD-10-CM | POA: Insufficient documentation

## 2014-05-21 DIAGNOSIS — R918 Other nonspecific abnormal finding of lung field: Secondary | ICD-10-CM | POA: Insufficient documentation

## 2014-05-21 DIAGNOSIS — Z853 Personal history of malignant neoplasm of breast: Secondary | ICD-10-CM

## 2014-05-21 DIAGNOSIS — J9611 Chronic respiratory failure with hypoxia: Secondary | ICD-10-CM | POA: Diagnosis not present

## 2014-05-21 DIAGNOSIS — C3491 Malignant neoplasm of unspecified part of right bronchus or lung: Secondary | ICD-10-CM

## 2014-05-21 DIAGNOSIS — Z902 Acquired absence of lung [part of]: Secondary | ICD-10-CM

## 2014-05-21 DIAGNOSIS — Z9889 Other specified postprocedural states: Secondary | ICD-10-CM

## 2014-05-21 LAB — SEDIMENTATION RATE: SED RATE: 43 mm/h — AB (ref 0–22)

## 2014-05-21 MED ORDER — TRAMADOL HCL 50 MG PO TABS: 50.0000 mg | ORAL_TABLET | Freq: Two times a day (BID) | ORAL | Status: DC | PRN

## 2014-05-21 NOTE — Progress Notes (Signed)
Reviewed and agree with assessment/plan. 

## 2014-05-21 NOTE — Assessment & Plan Note (Signed)
Chronic resp failure s/p RUL lobectomy w/ associated pulmonary infiltrate and small residual apical PTX  She is clincally improving with steroid challenge . CXR is improved but not cleared>will need to cont to follow serially.  Check ESR today (previously 125)  Improved with normal sats , will need O2 to keep sats >90% with act .  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  Labs today  [place] job on not smoking .  Follow up Dr. Halford Chessman  In 6-8 weeks  and As needed   Please contact office for sooner follow up if symptoms do not improve or worsen or seek emergency care

## 2014-05-21 NOTE — Assessment & Plan Note (Signed)
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 No chemo per oncology .  follow up with [facility] as planned

## 2014-05-21 NOTE — Progress Notes (Signed)
Subjective:    Patient ID: Gabrielle [name], female    DOB: April 30, 1955, 59 y.o.   MRN: [id number]  HPI 59 yo female with surgical resection of stage I a adenocarcinoma the lung with right upper lobectomy 04/20/14 . Postop course was complicated ARDS that improved with  steroids.  Seen during hospital admit for pulmonary consult.   TEST  PET scan 7/82/95 >> hypermetabolic RUL nodule 1.4 x 1.3 cm PFT 04/07/14 >> FEV1 1.91 (85%), FEV1% 73, TLC 4.22 (98%), DLCO 56% Echo 04/28/14 >> EF 55-60%. Grade 1 diastolic dysfunction.  ESR 125 03/2014 >  05/21/2014 Post Hospital follow up .  Pt was admitted 3/28-4/9 for  surgical resection of stage I a adenocarcinoma the lung with right upper lobectomy. Her postop course was complicated ARDS especially involving the left lung. ESR was high at 125. She responded to steroids. Ultimately she was discharged home on home oxygen 2 L and slow steroid taper.  Has finished steroids she is feeling much better. Mild DOE . Says sats are >90% at rest .  Occasionally drop with walking around 88%. Seen by [facility] this am, CXR showed apical PTX. , right mid /base atx.  Wearing oxygen with activity  Pt has not smoked since discharge .  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  Does have chickens at home [place] outside.  Today in office walk showed O2 sat at rest 98% on RA and walking with O2 sats at  She denies chest pain, orthopnea, rash, joint swelling, calf pain, n/v/d, hemoptysis.   Review of Systems  Constitutional:   No  weight loss, night sweats,  Fevers, chills, + fatigue, or  lassitude.  HEENT:   No headaches,  Difficulty swallowing,  Tooth/dental problems, or  Sore throat,                No sneezing, itching, ear ache, nasal congestion, post nasal drip,   CV:  No chest pain,  Orthopnea, PND,  , anasarca, dizziness, palpitations, syncope.   GI  No heartburn, indigestion, abdominal pain, nausea, vomiting, diarrhea, change in bowel habits, loss of  appetite, bloody stools.   Resp:   No excess mucus, no productive cough,  No non-productive cough,  No coughing up of blood.  No change in color of mucus.  No wheezing.  No chest wall deformity  Skin: no rash or lesions.  GU: no dysuria, change in color of urine, no urgency or frequency.  No flank pain, no hematuria   MS:  No joint pain or swelling.  No decreased range of motion.  No back pain.  Psych:  No change in mood or affect. No depression or anxiety.  No memory loss.          Objective:   Physical Exam GEN: A/Ox3; pleasant , NAD, well nourished   HEENT:  [place]/AT,  EACs-clear, TMs-wnl, NOSE-clear, THROAT-clear, no lesions, no postnasal drip or exudate noted.   NECK:  Supple w/ fair ROM; no JVD; normal carotid impulses w/o bruits; no thyromegaly or nodules palpated; no lymphadenopathy.  RESP  Clear  P & A; w/o, wheezes/ rales/ or rhonchi.no accessory muscle use, no dullness to percussion  CARD:  RRR, no m/r/g  , tr  peripheral edema, pulses intact, no cyanosis or clubbing.  GI:   Soft & nt; nml bowel sounds; no organomegaly or masses detected.  Musco: Warm bil, no deformities or joint swelling noted.   Neuro: alert, no focal deficits noted.    Skin:  Warm, no lesions or rashes    CXR 4/28 reviewed independently  Stable right apical pneumothorax. 2. Stable right base atelectasis and right pleural thickening/effusion .     Assessment & Plan:

## 2014-05-21 NOTE — Patient Instructions (Signed)
Wear Oxygen 2l/m with activity and At bedtime   Portable concentrator eval sent to DME.  Labs today  [place] job on not smoking .  Follow up Dr. Halford Chessman  In 6-8 weeks  and As needed   Please contact office for sooner follow up if symptoms do not improve or worsen or seek emergency care

## 2014-05-21 NOTE — Assessment & Plan Note (Signed)
Pulmonary infiltrates post op ? Etiology -steroid responsive  Pt is clinically improving with improved oxygenation and cxr is improved  Repeat cxr on return   Plan   Wear Oxygen 2l/m with activity and At bedtime   Portable concentrator eval sent to DME.  Labs today  [place] job on not smoking .  Follow up Dr. Halford Chessman  In 6-8 weeks  and As needed   Please contact office for sooner follow up if symptoms do not improve or worsen or seek emergency care

## 2014-05-21 NOTE — Progress Notes (Signed)
[address]       [place] [address]             [phone]      Amarilys G Bartek  Medical Record #[id number] Date of Birth: 1955-07-29  Referring: Everardo All, MD Primary Care: Celedonio Savage, MD  Chief Complaint:   POST OP FOLLOW UP 04/20/2014  OPERATIVE REPORT PREOPERATIVE DIAGNOSIS: New right upper lobe lung nodule, suspicious for carcinoma. POSTOPERATIVE DIAGNOSIS: Adenocarcinoma of the lung. PROCEDURE PERFORMED: Video bronchoscopy, left video-assisted thoracoscopy with wedge resection of right upper lobe lung nodule, and subsequent completion right upper lobectomy with lymph node dissection, placement of On-Q, and lymph node dissection. SURGEON: Lanelle Bal, MD  Invasive ductal carcinoma of right breast   Staging form: Breast, AJCC 7th Edition     Clinical: Stage IA (T1c, N0, cM0) - Signed by Baird Cancer, PA-C on 08/21/2012     Pathologic: Stage IA (T1c, N0, cM0) - Unsigned Lung cancer, right upper lobe   Staging form: Lung, AJCC 7th Edition     Pathologic stage from 04/23/2014: Stage IA (T1b, N0, cM0) - Signed by Grace Isaac, MD on 04/24/2014  History of Present Illness:     Patient returns to office after surgical resection of stage I a adenocarcinoma the lung with right upper lobectomy. Her postop course was complicated ARDS especially involving the left lung. She responded to steroids. Ultimately she was discharged home on home oxygen 2 L. She now is using oxygen just with exercise and at night. At rest her O2 sats are in the mid 90s. Since surgery she has no longer smoking.     Past Medical History  Diagnosis Date  . Hyperlipidemia   . Carpal tunnel syndrome   . Chronic pain in left foot   . GERD (gastroesophageal reflux disease)   . History of anemia   . H/O eating disorder   . Bipolar depression   . Breast cancer 04/2012    right  . Invasive ductal carcinoma of right breast 08/21/2012  . Tobacco abuse  08/21/2012    > 40 pack years  . S/P bilateral breast implants 08/21/2012    Prior to cancer diagnosis  . Diarrhea   . Cough   . Pneumonia   . Complication of anesthesia     pt says she gags really bad from secreation and reflux  . Orthostatic hypotension      History  Smoking status  . Current Every Day Smoker -- 1.00 packs/day for 40 years  . Types: Cigarettes  Smokeless tobacco  . Never Used    History  Alcohol Use  . 12.0 oz/week  . 20 Cans of beer per week    Comment: drinks 3 or 4 beers 3 - 4 times weekly     Allergies  Allergen Reactions  . Hydrocodone     Other reaction(s): Other Makes her hyper  . Metronidazole Nausea Only    Makes pt feel sick    Current Outpatient Prescriptions  Medication Sig Dispense Refill  . acetaminophen (TYLENOL) 500 MG tablet Take 1,000 mg by mouth 2 (two) times daily. Takes 2 bid    . anastrozole (ARIMIDEX) 1 MG tablet Take 1 tablet (1 mg total) by mouth daily. 30 tablet 3  . aspirin 81 MG tablet Take 81 mg by mouth daily.    . busPIRone (BUSPAR) 5 MG tablet Take 5 mg by mouth 3 (three) times daily.    Marland Kitchen  Calcium Citrate-Vitamin D (CALCIUM CITRATE + D3 PO) Take 1 capsule by mouth 2 (two) times daily. Calcium '630mg'$  & D3 500 IU    . gabapentin (NEURONTIN) 800 MG tablet Take 800 mg by mouth 3 (three) times daily.     Marland Kitchen ibuprofen (ADVIL,MOTRIN) 800 MG tablet Take 800 mg by mouth daily. Takes 1 - 2 daily    . metoprolol (LOPRESSOR) 100 MG tablet Take 100 mg by mouth every morning.    . metoprolol (LOPRESSOR) 50 MG tablet Take 50 mg by mouth at bedtime.    Marland Kitchen omeprazole (PRILOSEC) 20 MG capsule Take 20 mg by mouth daily.    . pravastatin (PRAVACHOL) 40 MG tablet Take 40 mg by mouth daily.    . traZODone (DESYREL) 100 MG tablet Take 200 mg by mouth at bedtime. Takes 2 at bedtime     No current facility-administered medications for this visit.       Physical Exam: BP 178/90 mmHg  Pulse 67  Resp 20  Ht '5\' 1"'$  (1.549 m)  Wt 165 lb  (74.844 kg)  BMI 31.19 kg/m2  SpO2 95%  General appearance: alert and cooperative Neurologic: intact Heart: regular rate and rhythm, S1, S2 normal, no murmur, click, rub or gallop Lungs: clear to auscultation bilaterally Abdomen: soft, non-tender; bowel sounds normal; no masses,  no organomegaly Extremities: extremities normal, atraumatic, no cyanosis or edema and Homans sign is negative, no sign of DVT Wound: right chest incisions are well-healed chest tube sutures were removed   Diagnostic Studies & Laboratory data:     Recent Radiology Findings:   Dg Chest 2 View  05/21/2014   CLINICAL DATA:  Malignant neoplasm right lung. Right lung surgery. History of breast cancer.  EXAM: CHEST  2 VIEW  COMPARISON:  05/02/2014, 04/27/2014.  PET-CT 04/03/2014.  FINDINGS: Mediastinum and hilar structures stable. Persistent right mid lung and right lung base atelectasis and right pleural effusion. Stable right pleural thickening . Tiny unchanged right apical pneumothorax. Heart size stable. No acute bony abnormality .  IMPRESSION: 1. Stable right apical pneumothorax. 2. Stable right base atelectasis and right pleural thickening/effusion .   Electronically Signed   By: Marcello Moores  Register   On: 05/21/2014 09:24      Recent Lab Findings: Lab Results  Component Value Date   WBC 17.0* 05/02/2014   HGB 10.6* 05/02/2014   HCT 29.8* 05/02/2014   PLT 342 05/02/2014   GLUCOSE 96 05/01/2014   ALT 21 04/22/2014   AST 64* 04/22/2014   NA 133* 05/01/2014   K 3.9 05/01/2014   CL 90* 05/01/2014   CREATININE 0.79 05/01/2014   BUN 13 05/01/2014   CO2 34* 05/01/2014   INR 0.94 04/17/2014      Assessment / Plan:   #1 patient is currently a nonsmoker #2 patient making good progress following right upper lobectomy for stage IA adenocarcinoma the lung #3 continues on home oxygen therapy but just with exercise and at night  Plan to see the patient back in approximately 2-2-1/2 months with a follow-up chest  x-ray. We'll plan CT scan of the chest 6 months postop. 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  Grace Isaac MD      [address] [place] [address] Office [phone]   Beeper [phone]  05/21/2014 10:30  AM        

## 2014-05-22 NOTE — Addendum Note (Signed)
Addended by: Parke Poisson E on: 05/22/2014 09:49 AM   Modules accepted: Orders

## 2014-05-22 NOTE — Progress Notes (Signed)
Quick Note:  Called spoke with patient's spouse. Advised of lab results / recs as stated by TP. Spouse verbalized understanding and denied any questions. ______ 

## 2014-05-25 ENCOUNTER — Telehealth: Payer: Self-pay | Admitting: Adult Health

## 2014-05-25 NOTE — Telephone Encounter (Signed)
Order was sent to [facility] on 05/22/14. Called Melissa w/ [facility] and confirmed they do have order but it does take a few days to hear from someone.  Called made pt aware. Nothing further needed

## 2014-06-09 ENCOUNTER — Other Ambulatory Visit: Payer: Self-pay | Admitting: *Deleted

## 2014-06-09 DIAGNOSIS — G8918 Other acute postprocedural pain: Secondary | ICD-10-CM

## 2014-06-09 MED ORDER — TRAMADOL HCL 50 MG PO TABS: 50.0000 mg | ORAL_TABLET | Freq: Two times a day (BID) | ORAL | Status: DC | PRN

## 2014-06-09 NOTE — Telephone Encounter (Signed)
A new script was faxed to her pharmacy and she was informed.

## 2014-07-21 ENCOUNTER — Ambulatory Visit (INDEPENDENT_AMBULATORY_CARE_PROVIDER_SITE_OTHER): Payer: 59 | Admitting: Pulmonary Disease

## 2014-07-21 ENCOUNTER — Encounter: Payer: Self-pay | Admitting: Pulmonary Disease

## 2014-07-21 ENCOUNTER — Ambulatory Visit (INDEPENDENT_AMBULATORY_CARE_PROVIDER_SITE_OTHER)
Admission: RE | Admit: 2014-07-21 | Discharge: 2014-07-21 | Disposition: A | Discharge status: Home / Self Care | Payer: 59 | Source: Ambulatory Visit | Attending: Pulmonary Disease | Admitting: Pulmonary Disease

## 2014-07-21 VITALS — BP 148/78 | HR 65 | Ht 61.0 in | Wt 157.0 lb

## 2014-07-21 DIAGNOSIS — R918 Other nonspecific abnormal finding of lung field: Secondary | ICD-10-CM

## 2014-07-21 NOTE — Progress Notes (Signed)
Chief Complaint  Patient presents with  . Follow-up    Pt here after seeing TP for HFU on 4.28.16. Pt with CXR from today. Pt states her breathing has imrpoved since she saw TP. Pt c/o DOE. Pt requesting to have nocturnal O2 d/c. Pt denies coughing and CP/tightness.     History of Present Illness: Gabrielle [name] is a 59 y.o. female who developed pulmonary infiltrates after Rt upper lobectomy for NSCLC in March 2016.  Her breathing is doing much better since she was seen in hospital.  She is not using oxygen during the day.  She still uses oxygen at night, but wants to see if this can be d/c'ed.  She gets a feeling like something is stuck in her throat and she tries to cough, but does not usually bring anything up.  She will get winded when lifting groceries or walking up stairs > she checks her pulse ox and never goes below 92%.  She denies fever, swelling, chest pain, or wheeze.  TESTS: PET scan 3/35/82 >> hypermetabolic RUL nodule 1.4 x 1.3 cm PFT 04/07/14 >> FEV1 1.91 (85%), FEV1% 73, TLC 4.22 (98%), DLCO 56% Echo 04/28/14 >> EF 55 to 51%, grade 1 diastolic dysfx  Past medical hx >> Breast cancer, GERD, Bipolar, HLD  Past surgical hx, Medications, Allergies, Family hx, Social hx all reviewed.   Physical Exam: BP 148/78 mmHg  Pulse 65  Ht '5\' 1"'$  (1.549 m)  Wt 157 lb (71.215 kg)  BMI 29.68 kg/m2  SpO2 97%  General - No distress ENT - No sinus tenderness, no oral exudate, no LAN Cardiac - s1s2 regular, no murmur Chest - No wheeze/rales/dullness Back - No focal tenderness Abd - Soft, non-tender Ext - No edema Neuro - Normal strength Skin - No rashes Psych - normal mood, and behavior   Dg Chest 2 View  07/21/2014   CLINICAL DATA:  Pulmonary infiltrates.  EXAM: CHEST  2 VIEW  COMPARISON:  May 21, 2014.  FINDINGS: The heart size and mediastinal contours are within normal limits. Both lungs are clear. No definite pneumothorax or pleural effusion is noted. The visualized  skeletal structures are unremarkable.  IMPRESSION: No active cardiopulmonary disease.   Electronically Signed   By: Marijo Conception, M.D.   On: 07/21/2014 17:02    Assessment/Plan:  Steroid responsive pulmonary infiltrates after VATS for NSCLC in March 2016. Resolved on chest xray, and symptomatically improved. Plan: - no additional f/u for this needed  Hypoxic respiratory failure. Plan: - will check ONO on RA >> if okay, then will d/c home oxygen set up  Laryngopharyngeal cough reflux. Likely related to GERD. Plan: - advised her to try increasing her prilosec to 40 mg daily for now - advised her to use prilosec 30 minutes before first meal of the day - she can f/u with her PCP for further assessment of reflux   Chesley Mires, MD [place] Pager:  [phone]

## 2014-07-21 NOTE — Patient Instructions (Signed)
Will arrange for overnight oxygen test on room air and call with results Follow up as needed

## 2014-07-23 ENCOUNTER — Telehealth: Payer: Self-pay | Admitting: Pulmonary Disease

## 2014-07-23 NOTE — Telephone Encounter (Signed)
Spoke with pt, states she was supposed to be set up for ONO, has not yet heard anything about scheduling this yet.   Called [facility] and spoke with Fritz Pickerel, states the order was received yesterday and is still being processed by respiratory.  He estimates that pt should be called within the next day.  Spoke with pt, she is aware.  Nothing further needed.

## 2014-07-30 ENCOUNTER — Telehealth: Payer: Self-pay | Admitting: Pulmonary Disease

## 2014-07-30 NOTE — Telephone Encounter (Signed)
ONO with RA 07/24/14 >> test time 9 hrs 57 min.  Mean SpO2 91.8%, low SpO2 82%.  Spent 1 hr 59 min with SpO2 < 88%.  Will have my nurse inform pt that her oxygen is still low at night.  She should continue using oxygen while asleep for now.  She will need ROV in 2 months to re-assess respiratory status and home oxygen needs.

## 2014-07-30 NOTE — Telephone Encounter (Signed)
I spoke with patient about results and she verbalized understanding and had no questions Recall reminder placed in epic for 2 month follow up

## 2014-08-06 ENCOUNTER — Encounter: Payer: Self-pay | Admitting: Cardiothoracic Surgery

## 2014-08-06 ENCOUNTER — Ambulatory Visit (INDEPENDENT_AMBULATORY_CARE_PROVIDER_SITE_OTHER): Payer: 59 | Admitting: Cardiothoracic Surgery

## 2014-08-06 VITALS — BP 150/86 | HR 60 | Resp 20 | Ht 61.0 in | Wt 155.0 lb

## 2014-08-06 DIAGNOSIS — C3491 Malignant neoplasm of unspecified part of right bronchus or lung: Secondary | ICD-10-CM

## 2014-08-06 DIAGNOSIS — Z9889 Other specified postprocedural states: Secondary | ICD-10-CM

## 2014-08-06 DIAGNOSIS — Z853 Personal history of malignant neoplasm of breast: Secondary | ICD-10-CM | POA: Diagnosis not present

## 2014-08-06 DIAGNOSIS — Z902 Acquired absence of lung [part of]: Secondary | ICD-10-CM

## 2014-08-06 NOTE — Progress Notes (Signed)
[address]       [place] [address]             [phone]      Gabrielle [name] Medical Record #[id number] Date of Birth: 07/07/1955  Referring: Everardo All, MD Primary Care: Celedonio Savage, MD  Chief Complaint:   POST OP FOLLOW UP 04/20/2014  OPERATIVE REPORT PREOPERATIVE DIAGNOSIS: New right upper lobe lung nodule, suspicious for carcinoma. POSTOPERATIVE DIAGNOSIS: Adenocarcinoma of the lung. PROCEDURE PERFORMED: Video bronchoscopy, left video-assisted thoracoscopy with wedge resection of right upper lobe lung nodule, and subsequent completion right upper lobectomy with lymph node dissection, placement of On-Q, and lymph node dissection. SURGEON: Lanelle Bal, MD  Invasive ductal carcinoma of right breast   Staging form: Breast, AJCC 7th Edition     Clinical: Stage IA (T1c, N0, cM0) - Signed by Baird Cancer, PA-C on 08/21/2012     Pathologic: Stage IA (T1c, N0, cM0) - Unsigned Lung cancer, right upper lobe   Staging form: Lung, AJCC 7th Edition     Pathologic stage from 04/23/2014: Stage IA (T1b, N0, cM0) - Signed by Grace Isaac, MD on 04/24/2014  History of Present Illness:     Patient returns to office after surgical resection of stage I a adenocarcinoma the lung with right upper lobectomy. Her postop course was complicated ARDS especially involving the left lung. She responded to steroids.   She has smoked since age 59, now stopped. She notes that she recently had nighttime oximetry testing and dropped her sats during sleep, she now continues on nighttime oxygen per Dr. Halford Chessman. She remains active, and does not feel like she needs oxygen during the day.   Past Medical History  Diagnosis Date  . Hyperlipidemia   . Carpal tunnel syndrome   . Chronic pain in left foot   . GERD (gastroesophageal reflux disease)   . History of anemia   . H/O eating disorder   . Bipolar depression   . Breast cancer 04/2012    right    . Invasive ductal carcinoma of right breast 08/21/2012  . Tobacco abuse 08/21/2012    > 40 pack years  . S/P bilateral breast implants 08/21/2012    Prior to cancer diagnosis  . Diarrhea   . Cough   . Pneumonia   . Complication of anesthesia     pt says she gags really bad from secreation and reflux  . Orthostatic hypotension      History  Smoking status  . Former Smoker -- 1.00 packs/day for 40 years  . Types: Cigarettes  . Quit date: 04/20/2014  Smokeless tobacco  . Never Used    History  Alcohol Use  . 12.0 oz/week  . 20 Cans of beer per week    Comment: drinks 3 or 4 beers 3 - 4 times weekly     Allergies  Allergen Reactions  . Hydrocodone     Other reaction(s): Other Makes her hyper  . Metronidazole Nausea Only    Makes pt feel sick    Current Outpatient Prescriptions  Medication Sig Dispense Refill  . acetaminophen (TYLENOL) 500 MG tablet Take 1,000 mg by mouth 2 (two) times daily. Takes 2 bid    . anastrozole (ARIMIDEX) 1 MG tablet Take 1 tablet (1 mg total) by mouth daily. 30 tablet 3  . aspirin 81 MG tablet Take 81 mg by mouth daily.    . busPIRone (BUSPAR) 5 MG tablet Take  15 mg by mouth 2 (two) times daily.     . Calcium Citrate-Vitamin D (CALCIUM CITRATE + D3 PO) Take 1 capsule by mouth 2 (two) times daily. Calcium '630mg'$  & D3 500 IU    . gabapentin (NEURONTIN) 800 MG tablet Take 800 mg by mouth 3 (three) times daily.     Marland Kitchen ibuprofen (ADVIL,MOTRIN) 800 MG tablet Take 800 mg by mouth daily. Takes 1 - 2 daily    . metoprolol (LOPRESSOR) 100 MG tablet Take 100 mg by mouth every morning.    . metoprolol (LOPRESSOR) 50 MG tablet Take 50 mg by mouth at bedtime.    Marland Kitchen omeprazole (PRILOSEC) 20 MG capsule Take 20 mg by mouth daily.    . pravastatin (PRAVACHOL) 40 MG tablet Take 40 mg by mouth daily.    . traZODone (DESYREL) 100 MG tablet Take 150 mg by mouth at bedtime. Takes 2 at bedtime     No current facility-administered medications for this visit.        Physical Exam: BP 150/86 mmHg  Pulse 60  Resp 20  Ht '5\' 1"'$  (1.549 m)  Wt 155 lb (70.308 kg)  BMI 29.30 kg/m2  SpO2 96%  General appearance: alert and cooperative Neurologic: intact Heart: regular rate and rhythm, S1, S2 normal, no murmur, click, rub or gallop Lungs: clear to auscultation bilaterally Abdomen: soft, non-tender; bowel sounds normal; no masses,  no organomegaly Extremities: extremities normal, atraumatic, no cyanosis or edema and Homans sign is negative, no sign of DVT Wound: right chest incisions are well-healed chest tube sutures were removed   Diagnostic Studies & Laboratory data:     Recent Radiology Findings:   Dg Chest 2 View  07/21/2014   CLINICAL DATA:  Pulmonary infiltrates.  EXAM: CHEST  2 VIEW  COMPARISON:  May 21, 2014.  FINDINGS: The heart size and mediastinal contours are within normal limits. Both lungs are clear. No definite pneumothorax or pleural effusion is noted. The visualized skeletal structures are unremarkable.  IMPRESSION: No active cardiopulmonary disease.   Electronically Signed   By: Marijo Conception, M.D.   On: 07/21/2014 17:02     Recent Lab Findings: Lab Results  Component Value Date   WBC 17.0* 05/02/2014   HGB 10.6* 05/02/2014   HCT 29.8* 05/02/2014   PLT 342 05/02/2014   GLUCOSE 96 05/01/2014   ALT 21 04/22/2014   AST 64* 04/22/2014   NA 133* 05/01/2014   K 3.9 05/01/2014   CL 90* 05/01/2014   CREATININE 0.79 05/01/2014   BUN 13 05/01/2014   CO2 34* 05/01/2014   INR 0.94 04/17/2014      Assessment / Plan:   #1 patient is currently a nonsmoker ! #2 patient making good progress following right upper lobectomy for stage IA adenocarcinoma the lung #3 continues on home oxygen therapy at night    Medical oncology has reviewed her pathologic findings and discussed with her no need for chemotherapy, she will keep her usual oncology appointment in August 2016 She prefers to be followed with scans in [place], I  discussed with her having follow-up CT scans every 6 months for 57 years and then yearly. She has an appointment with oncology in [place] in August.  I've not made a return appointment to see me but would be glad to see at her or Dr. Jaclyn Prime requested anytime  Grace Isaac MD      [address] [place] [address] Office [phone]   [place]  08/06/2014 4:14 PM

## 2014-11-05 ENCOUNTER — Encounter: Payer: Self-pay | Admitting: Pulmonary Disease

## 2014-11-05 ENCOUNTER — Ambulatory Visit (INDEPENDENT_AMBULATORY_CARE_PROVIDER_SITE_OTHER): Payer: 59 | Admitting: Pulmonary Disease

## 2014-11-05 VITALS — BP 108/68 | HR 66 | Ht 61.0 in | Wt 167.0 lb

## 2014-11-05 DIAGNOSIS — Z23 Encounter for immunization: Secondary | ICD-10-CM

## 2014-11-05 DIAGNOSIS — J432 Centrilobular emphysema: Secondary | ICD-10-CM | POA: Diagnosis not present

## 2014-11-05 DIAGNOSIS — J9611 Chronic respiratory failure with hypoxia: Secondary | ICD-10-CM | POA: Diagnosis not present

## 2014-11-05 MED ORDER — ALBUTEROL SULFATE 108 (90 BASE) MCG/ACT IN AEPB: 2.0000 | INHALATION_SPRAY | Freq: Four times a day (QID) | RESPIRATORY_TRACT | Status: DC | PRN

## 2014-11-05 NOTE — Patient Instructions (Signed)
Proair respiclick every 4 to 6 hours as needed for cough, wheeze, or chest congestion  Flu shot today  Follow up in 4 months

## 2014-11-05 NOTE — Progress Notes (Signed)
Chief Complaint  Patient presents with  . Follow-up    Pulmonary Infiltrates. Pt is only using O2 QHS. Pt c/o of mild wheezing. Pt denies cough/CP/tightness.     History of Present Illness: Gabrielle [name] is a 59 y.o. female former smoker with wheezing and nocturnal hypoxia after RULectomy in March 8921 for NSCLC complicated by steroid responsive pulmonary infiltrates.  She has noticed occasional cough and wheezing.  Her wheezing gets better once she coughs, and will sometimes bring up sputum.  She denies post-nasal drip, and her reflux does not seem to be an issue.  She continues to use oxygen at night.  TESTS: PET scan 1/94/17 >> hypermetabolic RUL nodule 1.4 x 1.3 cm PFT 04/07/14 >> FEV1 1.91 (85%), FEV1% 73, TLC 4.22 (98%), DLCO 56% Echo 04/28/14 >> EF 55 to 40%, grade 1 diastolic dysfx ONO with RA 07/24/14 >> test time 9 hrs 57 min. Mean SpO2 91.8%, low SpO2 82%. Spent 1 hr 59 min with SpO2 < 88%.   Past medical hx >> Breast cancer, GERD, Bipolar, HLD, NSCLC s/p RUL March 2016, Steroid responsive pulmonary infiltrates March 2016  Past surgical hx, Medications, Allergies, Family hx, Social hx all reviewed.   Physical Exam: BP 108/68 mmHg  Pulse 66  Ht '5\' 1"'$  (1.549 m)  Wt 167 lb (75.751 kg)  BMI 31.57 kg/m2  SpO2 98%  General - No distress ENT - No sinus tenderness, no oral exudate, no LAN Cardiac - s1s2 regular, no murmur Chest - No wheeze/rales/dullness Back - No focal tenderness Abd - Soft, non-tender Ext - No edema Neuro - Normal strength Skin - No rashes Psych - normal mood, and behavior  Assessment/Plan:  Wheezing with hx of tobacco abuse. Plan: - trial of albuterol  Chronic hypoxic respiratory failure. Plan: - continue supplemental oxygen at night  Laryngopharyngeal cough reflux. Likely related to GERD.  Stable. Plan: - continue prilosec per her PCP   Chesley Mires, MD [place] [facility]/Critical Care/Sleep Pager:  [phone]

## 2014-11-18 ENCOUNTER — Telehealth: Payer: Self-pay | Admitting: *Deleted

## 2014-11-18 MED ORDER — ALBUTEROL SULFATE HFA 108 (90 BASE) MCG/ACT IN AERS: 2.0000 | INHALATION_SPRAY | Freq: Four times a day (QID) | RESPIRATORY_TRACT | Status: DC | PRN

## 2014-11-18 NOTE — Telephone Encounter (Signed)
Received fax that patient's insurance prefers ventolin over proair respiclick. Please advise Dr. Halford Chessman thanks

## 2014-11-18 NOTE — Telephone Encounter (Signed)
Can change to ventolin HFA.

## 2014-11-18 NOTE — Telephone Encounter (Signed)
Medication sent to pharmacy  Pt notified of medication change  Nothing further is needed

## 2014-12-30 ENCOUNTER — Telehealth: Payer: Self-pay | Admitting: Pulmonary Disease

## 2014-12-30 DIAGNOSIS — J9611 Chronic respiratory failure with hypoxia: Secondary | ICD-10-CM

## 2014-12-30 NOTE — Telephone Encounter (Signed)
Okay to send order. 

## 2014-12-30 NOTE — Telephone Encounter (Signed)
Spoke with pt and is aware order has been placed. Nothing further needed

## 2014-12-30 NOTE — Telephone Encounter (Signed)
Spoke with pt, requesting an order to [facility] be placed stating she only wears 02 qhs, so her daytime tanks can be picked up.  It looks like this was mentioned at pt's last ov.    VS are you ok with this order?  Thanks!

## 2015-04-27 DIAGNOSIS — M7989 Other specified soft tissue disorders: Secondary | ICD-10-CM | POA: Diagnosis not present

## 2015-04-27 DIAGNOSIS — D649 Anemia, unspecified: Secondary | ICD-10-CM | POA: Diagnosis not present

## 2015-04-30 ENCOUNTER — Ambulatory Visit: Payer: 59 | Admitting: Adult Health

## 2015-05-02 DIAGNOSIS — C3490 Malignant neoplasm of unspecified part of unspecified bronchus or lung: Secondary | ICD-10-CM | POA: Diagnosis not present

## 2015-05-05 ENCOUNTER — Ambulatory Visit (INDEPENDENT_AMBULATORY_CARE_PROVIDER_SITE_OTHER)
Admission: RE | Admit: 2015-05-05 | Discharge: 2015-05-05 | Disposition: A | Discharge status: Home / Self Care | Payer: BLUE CROSS/BLUE SHIELD | Source: Ambulatory Visit | Attending: Pulmonary Disease | Admitting: Pulmonary Disease

## 2015-05-05 ENCOUNTER — Other Ambulatory Visit (INDEPENDENT_AMBULATORY_CARE_PROVIDER_SITE_OTHER): Payer: BLUE CROSS/BLUE SHIELD

## 2015-05-05 ENCOUNTER — Encounter: Payer: Self-pay | Admitting: Pulmonary Disease

## 2015-05-05 ENCOUNTER — Ambulatory Visit (INDEPENDENT_AMBULATORY_CARE_PROVIDER_SITE_OTHER): Payer: BLUE CROSS/BLUE SHIELD | Admitting: Pulmonary Disease

## 2015-05-05 VITALS — BP 102/64 | HR 66 | Ht 61.0 in | Wt 178.0 lb

## 2015-05-05 DIAGNOSIS — R0600 Dyspnea, unspecified: Secondary | ICD-10-CM

## 2015-05-05 DIAGNOSIS — C3411 Malignant neoplasm of upper lobe, right bronchus or lung: Secondary | ICD-10-CM | POA: Diagnosis not present

## 2015-05-05 DIAGNOSIS — J9611 Chronic respiratory failure with hypoxia: Secondary | ICD-10-CM

## 2015-05-05 LAB — CBC WITH DIFFERENTIAL/PLATELET
BASOS PCT: 0.6 % (ref 0.0–3.0)
Basophils Absolute: 0 10*3/uL (ref 0.0–0.1)
Eosinophils Absolute: 0.2 10*3/uL (ref 0.0–0.7)
Eosinophils Relative: 4.1 % (ref 0.0–5.0)
HEMATOCRIT: 32 % — AB (ref 36.0–46.0)
HEMOGLOBIN: 10.9 g/dL — AB (ref 12.0–15.0)
LYMPHS PCT: 37.6 % (ref 12.0–46.0)
Lymphs Abs: 1.9 10*3/uL (ref 0.7–4.0)
MCHC: 34.2 g/dL (ref 30.0–36.0)
MCV: 93.5 fl (ref 78.0–100.0)
MONOS PCT: 8.3 % (ref 3.0–12.0)
Monocytes Absolute: 0.4 10*3/uL (ref 0.1–1.0)
NEUTROS ABS: 2.4 10*3/uL (ref 1.4–7.7)
Neutrophils Relative %: 49.4 % (ref 43.0–77.0)
PLATELETS: 245 10*3/uL (ref 150.0–400.0)
RBC: 3.42 Mil/uL — ABNORMAL LOW (ref 3.87–5.11)
RDW: 14.4 % (ref 11.5–15.5)
WBC: 4.9 10*3/uL (ref 4.0–10.5)

## 2015-05-05 LAB — COMPREHENSIVE METABOLIC PANEL
ALBUMIN: 4.5 g/dL (ref 3.5–5.2)
ALT: 33 U/L (ref 0–35)
AST: 28 U/L (ref 0–37)
Alkaline Phosphatase: 73 U/L (ref 39–117)
BILIRUBIN TOTAL: 0.3 mg/dL (ref 0.2–1.2)
BUN: 17 mg/dL (ref 6–23)
CALCIUM: 9.9 mg/dL (ref 8.4–10.5)
CHLORIDE: 98 meq/L (ref 96–112)
CO2: 28 meq/L (ref 19–32)
CREATININE: 1.27 mg/dL — AB (ref 0.40–1.20)
GFR: 46.02 mL/min — ABNORMAL LOW (ref >60.00)
Glucose, Bld: 90 mg/dL (ref 70–99)
Potassium: 4.9 mEq/L (ref 3.5–5.1)
Sodium: 134 mEq/L — ABNORMAL LOW (ref 135–145)
Total Protein: 6.8 g/dL (ref 6.0–8.3)

## 2015-05-05 LAB — CK: CK TOTAL: 59 U/L (ref 7–177)

## 2015-05-05 LAB — SEDIMENTATION RATE: SED RATE: 23 mm/h — AB (ref 0–22)

## 2015-05-05 LAB — RHEUMATOID FACTOR

## 2015-05-05 NOTE — Progress Notes (Signed)
Current Outpatient Prescriptions on File Prior to Visit  Medication Sig  . acetaminophen (TYLENOL) 500 MG tablet Take 1,000 mg by mouth 2 (two) times daily. Takes 2 bid  . albuterol (PROVENTIL HFA;VENTOLIN HFA) 108 (90 BASE) MCG/ACT inhaler Inhale 2 puffs into the lungs every 6 (six) hours as needed for wheezing or shortness of breath.  . anastrozole (ARIMIDEX) 1 MG tablet Take 1 tablet (1 mg total) by mouth daily.  Marland Kitchen aspirin 81 MG tablet Take 81 mg by mouth daily.  . busPIRone (BUSPAR) 5 MG tablet Take 15 mg by mouth 2 (two) times daily.   Marland Kitchen gabapentin (NEURONTIN) 800 MG tablet Take 800 mg by mouth 3 (three) times daily.   . hydrOXYzine (ATARAX/VISTARIL) 50 MG tablet Take 50 mg by mouth at bedtime.  Marland Kitchen ibuprofen (ADVIL,MOTRIN) 800 MG tablet Take 800 mg by mouth daily.   Marland Kitchen lamoTRIgine (LAMICTAL) 25 MG tablet Take 100 mg by mouth daily.   . metoprolol (LOPRESSOR) 100 MG tablet Take 100 mg by mouth every morning.  . metoprolol (LOPRESSOR) 50 MG tablet Take 50 mg by mouth at bedtime.  Marland Kitchen omeprazole (PRILOSEC) 20 MG capsule Take 20 mg by mouth daily.  . pravastatin (PRAVACHOL) 40 MG tablet Take 40 mg by mouth daily.  . traZODone (DESYREL) 100 MG tablet Take 150 mg by mouth at bedtime. Takes 2 at bedtime   No current facility-administered medications on file prior to visit.    Chief Complaint  Patient presents with  . Follow-up    Pt c/o increased SOB with exertion and wheezing at night - pt states that she feels her symptoms are increased d/t her activity being more and heavier at times. Near syncope spells. Pt notes some lower leg edema - pitting- weight gain. Most recent hemoglobin x 1 month ago= 10    Tests PET scan 7/78/24 >> hypermetabolic RUL nodule 1.4 x 1.3 cm PFT 04/07/14 >> FEV1 1.91 (85%), FEV1% 73, TLC 4.22 (98%), DLCO 56% Echo 04/28/14 >> EF 55 to 23%, grade 1 diastolic dysfx ONO with RA 07/24/14 >> test time 9 hrs 57 min. Mean SpO2 91.8%, low SpO2 82%. Spent 1 hr 59 min with SpO2  < 88%.   Past medical history Breast cancer, GERD, Bipolar, HLD, NSCLC s/p RUL March 2016, Steroid responsive pulmonary infiltrates March 2016  Past surgical hx, Medications, Allergies, Family hx, Social hx all reviewed.  Vital signs BP 102/64 mmHg  Pulse 66  Ht '5\' 1"'$  (1.549 m)  Wt 178 lb (80.74 kg)  BMI 33.65 kg/m2  SpO2 94%  History of Present Illness: Gabrielle [name] is a 60 y.o. female former smoker with wheezing and nocturnal hypoxia after RULectomy in March 5361 for NSCLC complicated by steroid responsive pulmonary infiltrates.  She has noticed progressively more trouble breathing with exertion.  She can only walk about 50 feet.  She gets cramps in her legs also >> was seen by [facility] and told no claudication.  She has noticed lump behind her right leg.  She also has been getting swelling in her legs.    She is still using oxygen at night, but wants to see if she can get rid of this.  She had Echo recently with [facility] >> normal.  Physical Exam:  General - No distress ENT - No sinus tenderness, no oral exudate, no LAN Cardiac - s1s2 regular, no murmur Chest - No wheeze/rales/dullness Back - No focal tenderness Abd - Soft, non-tender Ext - No edema Neuro - Normal strength  Skin - No rashes Psych - normal mood, and behavior  Assessment/Plan:  Progressive dyspnea with exertion associated with muscle pains, and hx of steroid responsive pulmonary infiltrates. - will check labs and serology - repeat CXR >> might need HRCT chest  - repeat PFTs - albuterol prn - she is not able to walk more than 200 feet w/o having to rest >> completed handicap parking form  Chronic hypoxic respiratory failure. Plan: - continue supplemental oxygen at night - will re-assess her overnight oxygen needs after review of above tests   Patient Instructions  Chest xray and lab tests today Will schedule pulmonary function tests  Will call with test results  Follow up in 6  months     Chesley Mires, MD [place] [facility]/Critical Care/Sleep Pager:  [phone] 05/05/2015, 4:04 PM

## 2015-05-05 NOTE — Patient Instructions (Signed)
Chest xray and lab tests today Will schedule pulmonary function tests  Will call with test results  Follow up in 6 months

## 2015-05-06 LAB — ANA W/REFLEX IF POSITIVE: ANA: NEGATIVE

## 2015-05-10 ENCOUNTER — Telehealth: Payer: Self-pay | Admitting: Pulmonary Disease

## 2015-05-10 DIAGNOSIS — C3411 Malignant neoplasm of upper lobe, right bronchus or lung: Secondary | ICD-10-CM | POA: Diagnosis not present

## 2015-05-10 DIAGNOSIS — C50919 Malignant neoplasm of unspecified site of unspecified female breast: Secondary | ICD-10-CM | POA: Diagnosis not present

## 2015-05-10 DIAGNOSIS — R0602 Shortness of breath: Secondary | ICD-10-CM | POA: Diagnosis not present

## 2015-05-10 DIAGNOSIS — F17210 Nicotine dependence, cigarettes, uncomplicated: Secondary | ICD-10-CM | POA: Diagnosis not present

## 2015-05-10 NOTE — Telephone Encounter (Signed)
Dg Chest 2 View  05/06/2015  CLINICAL DATA:  History of right upper lobectomy for resection of lung carcinoma, followup EXAM: CHEST  2 VIEW COMPARISON:  CT chest of 04/08/2015, 10/02/2014, and 07/21/2014 FINDINGS: Surgical clips and sutures overlie the right suprahilar region from right upper lobectomy with the good expansion of the remainder of the right lung. No active infiltrate or effusion is seen. Mediastinal and hilar contours are unchanged and the heart is within upper limits of normal. No acute bony abnormality is seen. Old healed rib fractures are noted involving the posterior lower eighth and ninth ribs. IMPRESSION: Stable postoperative change on the right.  No active lung disease. Electronically Signed   By: Ivar Drape M.D.   On: 05/06/2015 08:22    Labs 05/06/15 >> CPK 59, RF < 10, ANA negative, ESR 23   CMP Latest Ref Rng 05/05/2015 05/01/2014 04/30/2014  Glucose 70 - 99 mg/dL 90 96 87  BUN 6 - 23 mg/dL 17 13 12   Creatinine 0.40 - 1.20 mg/dL 1.27(H) 0.79 0.70  Sodium 135 - 145 mEq/L 134(L) 133(L) 131(L)  Potassium 3.5 - 5.1 mEq/L 4.9 3.9 3.8  Chloride 96 - 112 mEq/L 98 90(L) 89(L)  CO2 19 - 32 mEq/L 28 34(H) 33(H)  Calcium 8.4 - 10.5 mg/dL 9.9 9.0 8.9  Total Protein 6.0 - 8.3 g/dL 6.8 - -  Total Bilirubin 0.2 - 1.2 mg/dL 0.3 - -  Alkaline Phos 39 - 117 U/L 73 - -  AST 0 - 37 U/L 28 - -  ALT 0 - 35 U/L 33 - -    CBC Latest Ref Rng 05/05/2015 05/02/2014 05/01/2014  WBC 4.0 - 10.5 K/uL 4.9 17.0(H) 16.7(H)  Hemoglobin 12.0 - 15.0 g/dL 10.9(L) 10.6(L) 10.6(L)  Hematocrit 36.0 - 46.0 % 32.0(L) 29.8(L) 29.3(L)  Platelets 150.0 - 400.0 K/uL 245.0 342 361    Will have my nurse inform pt that chest xray showed expected changes in right lung after surgery >> no other worrisome findings.  Lab tests showed mild anemia, and elevation in her kidney function tests >> she should follow up with her PCP to have these further assessed.

## 2015-05-10 NOTE — Telephone Encounter (Signed)
LM x 1 

## 2015-05-11 ENCOUNTER — Telehealth: Payer: Self-pay | Admitting: Pulmonary Disease

## 2015-05-11 DIAGNOSIS — R0600 Dyspnea, unspecified: Secondary | ICD-10-CM

## 2015-05-11 DIAGNOSIS — J9611 Chronic respiratory failure with hypoxia: Secondary | ICD-10-CM

## 2015-05-11 NOTE — Telephone Encounter (Signed)
LM x 1 

## 2015-05-11 NOTE — Telephone Encounter (Signed)
lmtcb X2 for pt.  

## 2015-05-11 NOTE — Telephone Encounter (Signed)
LM x3  

## 2015-05-11 NOTE — Telephone Encounter (Signed)
LM x 2

## 2015-05-12 NOTE — Telephone Encounter (Signed)
Spoke with pt.  Pt states this has been taken care of through [facility].  Nothing further needed.

## 2015-05-12 NOTE — Telephone Encounter (Signed)
lmtcb for pt.  Will place orders after speaking to pt.

## 2015-05-12 NOTE — Telephone Encounter (Signed)
Spoke with pt.  Discussed below results and recs per Dr. Halford Chessman.  Pt verbalized understanding and is in agreement with plan. Pt will call PCP to f/u on lab results.  She voiced no further questions or concerns at this time.

## 2015-05-12 NOTE — Telephone Encounter (Signed)
Can schedule PFT at [facility].  Can arrange for ONO with room air.

## 2015-05-12 NOTE — Telephone Encounter (Signed)
Spoke with pt.  Pt states she would like to proceed with scheduling PFTs now.  She would rather not wait until next appt with VS.  Pt would like to this scheduled at [facility] would call to schedule.  ATC Forestine Na to schedule PFT at [phone] - [facility], [facility].  Pt also states she thought ONO was to be scheduled now.  Advised it appears VS wanted to review test results first.  Dr. Halford Chessman, please advise.  Thank you.

## 2015-05-13 NOTE — Telephone Encounter (Signed)
Called spoke with patient and informed her that VS okayed for the PFT and ONO to be done now.  Pt is aware someone will be contacting her.  Orders placed.  Nothing further needed; will sign off.

## 2015-05-17 DIAGNOSIS — R0602 Shortness of breath: Secondary | ICD-10-CM | POA: Diagnosis not present

## 2015-05-19 ENCOUNTER — Encounter (HOSPITAL_COMMUNITY): Payer: BLUE CROSS/BLUE SHIELD

## 2015-05-21 ENCOUNTER — Encounter (HOSPITAL_COMMUNITY): Payer: BLUE CROSS/BLUE SHIELD

## 2015-05-21 ENCOUNTER — Ambulatory Visit (HOSPITAL_COMMUNITY)
Admission: RE | Admit: 2015-05-21 | Discharge: 2015-05-21 | Disposition: A | Discharge status: Home / Self Care | Payer: BLUE CROSS/BLUE SHIELD | Source: Ambulatory Visit | Attending: Pulmonary Disease | Admitting: Pulmonary Disease

## 2015-05-21 DIAGNOSIS — R0600 Dyspnea, unspecified: Secondary | ICD-10-CM | POA: Diagnosis not present

## 2015-05-21 DIAGNOSIS — J9611 Chronic respiratory failure with hypoxia: Secondary | ICD-10-CM

## 2015-05-21 LAB — PULMONARY FUNCTION TEST
DL/VA % PRED: 67 %
DL/VA: 2.98 ml/min/mmHg/L
DLCO UNC % PRED: 55 %
DLCO UNC: 11.12 ml/min/mmHg
FEF 25-75 POST: 1.56 L/s
FEF 25-75 Pre: 1.2 L/sec
FEF2575-%Change-Post: 29 %
FEF2575-%PRED-POST: 68 %
FEF2575-%PRED-PRE: 52 %
FEV1-%Change-Post: 5 %
FEV1-%PRED-PRE: 79 %
FEV1-%Pred-Post: 83 %
FEV1-Post: 1.96 L
FEV1-Pre: 1.85 L
FEV1FVC-%Change-Post: 0 %
FEV1FVC-%PRED-PRE: 91 %
FEV6-%Change-Post: 5 %
FEV6-%PRED-POST: 92 %
FEV6-%Pred-Pre: 87 %
FEV6-POST: 2.68 L
FEV6-Pre: 2.55 L
FEV6FVC-%CHANGE-POST: 0 %
FEV6FVC-%PRED-POST: 102 %
FEV6FVC-%Pred-Pre: 102 %
FVC-%Change-Post: 4 %
FVC-%Pred-Post: 89 %
FVC-%Pred-Pre: 85 %
FVC-PRE: 2.57 L
FVC-Post: 2.7 L
POST FEV1/FVC RATIO: 73 %
PRE FEV1/FVC RATIO: 72 %
Post FEV6/FVC ratio: 99 %
Pre FEV6/FVC Ratio: 99 %
RV % pred: 94 %
RV: 1.7 L
TLC % PRED: 96 %
TLC: 4.46 L

## 2015-05-21 MED ORDER — ALBUTEROL SULFATE (2.5 MG/3ML) 0.083% IN NEBU
2.5000 mg | INHALATION_SOLUTION | Freq: Once | RESPIRATORY_TRACT | Status: AC
  Administered 2015-05-21: 2.5 mg via RESPIRATORY_TRACT

## 2015-05-24 ENCOUNTER — Telehealth: Payer: Self-pay | Admitting: Pulmonary Disease

## 2015-05-24 DIAGNOSIS — J984 Other disorders of lung: Secondary | ICD-10-CM

## 2015-05-24 DIAGNOSIS — R0600 Dyspnea, unspecified: Secondary | ICD-10-CM

## 2015-05-24 DIAGNOSIS — R942 Abnormal results of pulmonary function studies: Secondary | ICD-10-CM

## 2015-05-24 NOTE — Telephone Encounter (Signed)
Order given to [facility] for ono and ct was put in to be scheduled Gabrielle [name]

## 2015-05-24 NOTE — Telephone Encounter (Signed)
PFT 04/07/14 >> FEV1 1.91 (85%), FEV1% 73, TLC 4.22 (98%), DLCO 56% PFT 05/21/15 >> FEV1 1.96 (83%), FEV1% 73, FEF 25-75% 1.56 (68%), TLC 4.46 (96%), DLCO 55%, +BD from FEF 25-75%   Results d/w pt.  Will schedule HRCT chest to further assess for small airway disease, emphysema, and post radiation fibrosis.  She was also to have ONO on room air >> hasn't been scheduled yet.  Will ask [place] to f/u on schedule ONO.

## 2015-05-28 ENCOUNTER — Ambulatory Visit (HOSPITAL_COMMUNITY): Payer: BLUE CROSS/BLUE SHIELD

## 2015-06-01 ENCOUNTER — Telehealth: Payer: Self-pay | Admitting: *Deleted

## 2015-06-01 DIAGNOSIS — C3490 Malignant neoplasm of unspecified part of unspecified bronchus or lung: Secondary | ICD-10-CM | POA: Diagnosis not present

## 2015-06-01 NOTE — Telephone Encounter (Signed)
CT report received and given to VS to review.  Will await disc to be mailed. Will send to to Dr Halford Chessman to advise if anything further needed.  Will hold in my box to follow up on disc

## 2015-06-01 NOTE — Telephone Encounter (Signed)
[facility] Radiology Dept [phone], spoke with Anderson Malta, requested CT report be faxed from March 2017 and Disk be mailed, mark as urgent.  Anderson Malta requests that we send a cover sheet with our information for HIPPA purposes to [phone] This has been faxed, will hold in my box to follow up.

## 2015-06-01 NOTE — Telephone Encounter (Signed)
-----   Message from Chesley Mires, MD sent at 05/31/2015  1:39 PM EDT -----  Apparently she had CT chest in March 2017 with [facility].  Will ask [place] to get copy of report and disk.  No need for her to have repeat CT chest done now.    ----- Message -----    From: Joellen Jersey    Sent: 05/31/2015   9:03 AM      To: Chesley Mires, MD  Because there had just been one precerted in march 17 for dr Tressie Stalker i dont know if she did it i dont see it in the chart ----- Message -----    From: Chesley Mires, MD    Sent: 05/28/2015  12:34 PM      To: Joellen Jersey  Did they give reason why this was denied?  V  ----- Message -----    From: Joellen Jersey    Sent: 05/28/2015  10:56 AM      To: Chesley Mires, MD  This pt's chest ct was denied through [facility]

## 2015-06-02 NOTE — Telephone Encounter (Signed)
CT chest disc received from [facility].  Given to VS today.

## 2015-06-10 ENCOUNTER — Telehealth: Payer: Self-pay | Admitting: Pulmonary Disease

## 2015-06-10 DIAGNOSIS — M79672 Pain in left foot: Secondary | ICD-10-CM | POA: Diagnosis not present

## 2015-06-10 DIAGNOSIS — M19072 Primary osteoarthritis, left ankle and foot: Secondary | ICD-10-CM | POA: Diagnosis not present

## 2015-06-10 NOTE — Telephone Encounter (Signed)
Spoke with pt and advised of Dr Juanetta Gosling recommendations.  Pt verbalized understanding and will continue to use oxygen at night.

## 2015-06-10 NOTE — Telephone Encounter (Signed)
Spoke with pt and informed that Dr Halford Chessman has not read ONO study.  Pt is still wearing oxygen 2L @ hs however ono was done on ra.  Results are in Dr Halford Chessman look at.  Also , pt received a letter that HRCT was denied by insurance due to "unknown dx codes".  Dr Halford Chessman will you be coming by office or do I need to have doc of day review results.

## 2015-06-10 NOTE — Telephone Encounter (Signed)
ONO report was received and addressed by Dr Halford Chessman.  Will send staff message to [facility]

## 2015-06-10 NOTE — Telephone Encounter (Signed)
The reason her HRCT was denied is that she had CT chest with oncology in March 2017.  I have reviewed this, and there were no findings to suggest a primary lung process that would contribute to her shortness of breath.  She therefore does not need HRCT and this order can be cancelled.  ONO on RA 06/01/15 >> test time 7 hrs 50 min.  Mean SpO2 90.07%, low SpO2 67%.  Spent 2 hrs 24 min with SpO2 < 88%.  She needs to continue using supplemental oxygen at night.

## 2015-06-10 NOTE — Telephone Encounter (Signed)
CT chest reviewed.

## 2015-06-11 DIAGNOSIS — R0602 Shortness of breath: Secondary | ICD-10-CM | POA: Diagnosis not present

## 2015-06-11 DIAGNOSIS — Z9889 Other specified postprocedural states: Secondary | ICD-10-CM | POA: Diagnosis not present

## 2015-06-11 DIAGNOSIS — F17200 Nicotine dependence, unspecified, uncomplicated: Secondary | ICD-10-CM | POA: Diagnosis not present

## 2015-06-11 DIAGNOSIS — Z85118 Personal history of other malignant neoplasm of bronchus and lung: Secondary | ICD-10-CM | POA: Diagnosis not present

## 2015-07-02 DIAGNOSIS — C3490 Malignant neoplasm of unspecified part of unspecified bronchus or lung: Secondary | ICD-10-CM | POA: Diagnosis not present

## 2015-07-14 DIAGNOSIS — C3411 Malignant neoplasm of upper lobe, right bronchus or lung: Secondary | ICD-10-CM | POA: Diagnosis not present

## 2015-07-14 DIAGNOSIS — C50911 Malignant neoplasm of unspecified site of right female breast: Secondary | ICD-10-CM | POA: Diagnosis not present

## 2015-07-14 DIAGNOSIS — Z853 Personal history of malignant neoplasm of breast: Secondary | ICD-10-CM | POA: Diagnosis not present

## 2015-07-14 DIAGNOSIS — Z923 Personal history of irradiation: Secondary | ICD-10-CM | POA: Diagnosis not present

## 2015-07-14 DIAGNOSIS — Z9889 Other specified postprocedural states: Secondary | ICD-10-CM | POA: Diagnosis not present

## 2015-07-19 ENCOUNTER — Encounter: Payer: Self-pay | Admitting: Pulmonary Disease

## 2015-07-21 DIAGNOSIS — Z23 Encounter for immunization: Secondary | ICD-10-CM | POA: Diagnosis not present

## 2015-07-21 DIAGNOSIS — Z0000 Encounter for general adult medical examination without abnormal findings: Secondary | ICD-10-CM | POA: Diagnosis not present

## 2015-08-01 DIAGNOSIS — C3490 Malignant neoplasm of unspecified part of unspecified bronchus or lung: Secondary | ICD-10-CM | POA: Diagnosis not present

## 2015-09-01 DIAGNOSIS — C3490 Malignant neoplasm of unspecified part of unspecified bronchus or lung: Secondary | ICD-10-CM | POA: Diagnosis not present

## 2015-10-02 DIAGNOSIS — C3490 Malignant neoplasm of unspecified part of unspecified bronchus or lung: Secondary | ICD-10-CM | POA: Diagnosis not present

## 2015-11-01 DIAGNOSIS — C3490 Malignant neoplasm of unspecified part of unspecified bronchus or lung: Secondary | ICD-10-CM | POA: Diagnosis not present

## 2015-12-02 DIAGNOSIS — C3490 Malignant neoplasm of unspecified part of unspecified bronchus or lung: Secondary | ICD-10-CM | POA: Diagnosis not present

## 2015-12-03 ENCOUNTER — Encounter: Payer: Self-pay | Admitting: Pulmonary Disease

## 2015-12-03 ENCOUNTER — Ambulatory Visit (INDEPENDENT_AMBULATORY_CARE_PROVIDER_SITE_OTHER): Payer: BLUE CROSS/BLUE SHIELD | Admitting: Pulmonary Disease

## 2015-12-03 VITALS — BP 132/74 | HR 94 | Ht 61.0 in | Wt 173.0 lb

## 2015-12-03 DIAGNOSIS — J4541 Moderate persistent asthma with (acute) exacerbation: Secondary | ICD-10-CM | POA: Diagnosis not present

## 2015-12-03 DIAGNOSIS — R0600 Dyspnea, unspecified: Secondary | ICD-10-CM | POA: Diagnosis not present

## 2015-12-03 LAB — NITRIC OXIDE: Nitric Oxide: <5

## 2015-12-03 MED ORDER — BECLOMETHASONE DIPROPIONATE 80 MCG/ACT IN AERS: 2.0000 | INHALATION_SPRAY | Freq: Two times a day (BID) | RESPIRATORY_TRACT | 5 refills | Status: DC

## 2015-12-03 MED ORDER — PREDNISONE 10 MG PO TABS: ORAL_TABLET | ORAL | 0 refills | Status: DC

## 2015-12-03 NOTE — Progress Notes (Signed)
Current Outpatient Prescriptions on File Prior to Visit  Medication Sig  . acetaminophen (TYLENOL) 500 MG tablet Take 1,000 mg by mouth 2 (two) times daily. Takes 2 bid  . albuterol (PROVENTIL HFA;VENTOLIN HFA) 108 (90 BASE) MCG/ACT inhaler Inhale 2 puffs into the lungs every 6 (six) hours as needed for wheezing or shortness of breath.  . anastrozole (ARIMIDEX) 1 MG tablet Take 1 tablet (1 mg total) by mouth daily.  Marland Kitchen aspirin 81 MG tablet Take 81 mg by mouth daily.  Marland Kitchen gabapentin (NEURONTIN) 800 MG tablet Take 800 mg by mouth 3 (three) times daily.   . hydrOXYzine (ATARAX/VISTARIL) 50 MG tablet Take 50 mg by mouth at bedtime.  Marland Kitchen ibuprofen (ADVIL,MOTRIN) 800 MG tablet Take 800 mg by mouth daily.   Marland Kitchen lamoTRIgine (LAMICTAL) 25 MG tablet Take 100 mg by mouth daily.   Marland Kitchen omeprazole (PRILOSEC) 20 MG capsule Take 20 mg by mouth daily.  . pravastatin (PRAVACHOL) 40 MG tablet Take 40 mg by mouth daily.  . traZODone (DESYREL) 100 MG tablet Take 150 mg by mouth at bedtime. Takes 2 at bedtime   No current facility-administered medications on file prior to visit.     Chief Complaint  Patient presents with  . Follow-up    Pt states that she has been doing well. Reports having bronchitis 3 times. Pt states that while she was sick her O2 would drop into the 70's with exertion -- seems to have returned to baseline since improved. Would like to have flu vaccine today.     Pulmonary tests PET scan 05/21/74 >> hypermetabolic RUL nodule 1.4 x 1.3 cm PFT 04/07/14 >> FEV1 1.91 (85%), FEV1% 73, TLC 4.22 (98%), DLCO 56% Labs 05/06/15 >> CPK 59, RF < 10, ANA negative, ESR 23 PFT 05/21/15 >> FEV1 1.96 (83%), FEV1% 73, FEF 25-75% 1.56 (68%), TLC 4.46 (96%), DLCO 55%, +BD from FEF 25-75% FeNO 12/03/15 >> 5  Sleep tests ONO with RA 07/24/14 >> test time 9 hrs 57 min. Mean SpO2 91.8%, low SpO2 82%. Spent 1 hr 59 min with SpO2 < 88%. ONO on RA 06/01/15 >> test time 7 hrs 50 min.  Mean SpO2 90.07%, low SpO2 67%.  Spent 2  hrs 24 min with SpO2 < 88%.  She needs to continue using supplemental oxygen at night  Cardiac tests Echo 04/28/14 >> EF 55 to 81%, grade 1 diastolic dysfx  Past medical history Breast cancer, GERD, Bipolar, HLD, NSCLC s/p RUL March 2016, Steroid responsive pulmonary infiltrates March 2016  Past surgical history, Family history, Social history, Allergies reviewed  Vital signs BP 132/74 (BP Location: Left Arm, Cuff Size: Normal)   Pulse 94   Ht 5' 1"  (1.549 m)   Wt 173 lb (78.5 kg)   SpO2 96%   BMI 32.69 kg/m   History of Present Illness: Gabrielle [name] is a 60 y.o. female former smoker with wheezing and nocturnal hypoxia after RULectomy in March 1572 for NSCLC complicated by steroid responsive pulmonary infiltrates.  Since last visit, she had several episodes of bronchitis.  She gets cough and wheeze.  She has been using albuterol intermittently and this helps.  She continues to use oxygen at night.  Physical Exam:  General - No distress ENT - No sinus tenderness, no oral exudate, no LAN Cardiac - s1s2 regular, no murmur Chest - diffuse b/l wheeze Back - No focal tenderness Abd - Soft, non-tender Ext - No edema Neuro - Normal strength Skin - No rashes Psych -  normal mood, and behavior  Assessment/Plan:  Acute asthma exacerbation - will give course of prednisone - add Qvar - continue prn albuterol  Chronic hypoxic respiratory failure. - continue supplemental oxygen at night   Patient Instructions  Prednisone as prescribed Qvar two puffs twice per day and rinse mouth after each use  Follow up in 4 weeks with Dr. Halford Chessman or Nurse Practitioner    Chesley Mires, MD [facility] Pager:  [phone] 12/03/2015, 5:01 PM

## 2015-12-03 NOTE — Patient Instructions (Signed)
Prednisone as prescribed Qvar two puffs twice per day and rinse mouth after each use  Follow up in 4 weeks with Dr. Halford Chessman or Nurse Practitioner

## 2015-12-31 ENCOUNTER — Encounter: Payer: Self-pay | Admitting: Adult Health

## 2015-12-31 ENCOUNTER — Ambulatory Visit (INDEPENDENT_AMBULATORY_CARE_PROVIDER_SITE_OTHER): Payer: BLUE CROSS/BLUE SHIELD | Admitting: Adult Health

## 2015-12-31 VITALS — BP 138/76 | HR 115 | Ht 61.0 in | Wt 174.4 lb

## 2015-12-31 DIAGNOSIS — J4520 Mild intermittent asthma, uncomplicated: Secondary | ICD-10-CM

## 2015-12-31 DIAGNOSIS — C3490 Malignant neoplasm of unspecified part of unspecified bronchus or lung: Secondary | ICD-10-CM | POA: Diagnosis not present

## 2015-12-31 DIAGNOSIS — Z23 Encounter for immunization: Secondary | ICD-10-CM

## 2015-12-31 NOTE — Patient Instructions (Addendum)
Refer to Oncology at [facility] for lung cancer follow up .  Set up a CT chest -lung cancer follow up .  Continue on QVAR , rinse after use .  Flu shot today .  Follow up with Dr. Halford Chessman in 6 months and As needed

## 2015-12-31 NOTE — Progress Notes (Signed)
Subjective:    Patient ID: Gabrielle [name], female    DOB: August 05, 1955, 60 y.o.   MRN: [id number]  HPI 60 yo female with surgical resection of stage I a adenocarcinoma the lung with right upper lobectomy 04/20/14 . Postop course was complicated ARDS that improved with  steroids.  Seen during hospital admit for pulmonary consult.   TEST  PET scan 4/78/41 >> hypermetabolic RUL nodule 1.4 x 1.3 cm PFT 04/07/14 >> FEV1 1.91 (85%), FEV1% 73, TLC 4.22 (98%), DLCO 56% Echo 04/28/14 >> EF 55-60%. Grade 1 diastolic dysfunction.  ESR 125 03/2014 > FeNO 12/03/15 >> 5   12/31/2015 Follow up ; Asthma/Lung cancer  Pt returns for 1 month follow up . She was seen last ov with asthma flare . Tx w/ steroid taper and started on QVAR . Feels that this has helped with decreased dyspnea. Denies chest pain, orthopena or edema.   She has hx of lung cancer s/p resection in 2016 .  [facility] closed down needs CT and oncology referral to [facility]     Does have chickens at home [place].   Past Medical History:  Diagnosis Date  . Bipolar depression ([place])   . Breast cancer ([place]) 04/2012   right  . Carpal tunnel syndrome   . Chronic pain in left foot   . Complication of anesthesia    pt says she gags really bad from secreation and reflux  . Cough   . Diarrhea   . GERD (gastroesophageal reflux disease)   . H/O eating disorder   . History of anemia   . Hyperlipidemia   . Invasive ductal carcinoma of right breast ([place]) 08/21/2012  . Orthostatic hypotension   . Pneumonia   . S/P bilateral breast implants 08/21/2012   Prior to cancer diagnosis  . Tobacco abuse 08/21/2012   > 40 pack years   Current Outpatient Prescriptions on File Prior to Visit  Medication Sig Dispense Refill  . acetaminophen (TYLENOL) 500 MG tablet Take 1,000 mg by mouth 2 (two) times daily. Takes 2 bid    . albuterol (PROVENTIL HFA;VENTOLIN HFA) 108 (90 BASE) MCG/ACT inhaler Inhale 2 puffs into the lungs every 6 (six) hours as  needed for wheezing or shortness of breath. 1 Inhaler 6  . anastrozole (ARIMIDEX) 1 MG tablet Take 1 tablet (1 mg total) by mouth daily. 30 tablet 3  . aspirin 81 MG tablet Take 81 mg by mouth daily.    . beclomethasone (QVAR) 80 MCG/ACT inhaler Inhale 2 puffs into the lungs 2 (two) times daily. 1 Inhaler 5  . gabapentin (NEURONTIN) 800 MG tablet Take 800 mg by mouth 3 (three) times daily.     . hydrOXYzine (ATARAX/VISTARIL) 50 MG tablet Take 50 mg by mouth at bedtime.    Marland Kitchen ibuprofen (ADVIL,MOTRIN) 800 MG tablet Take 800 mg by mouth daily.     Marland Kitchen lamoTRIgine (LAMICTAL) 25 MG tablet Take 100 mg by mouth daily.     Marland Kitchen omeprazole (PRILOSEC) 20 MG capsule Take 20 mg by mouth daily.    . pravastatin (PRAVACHOL) 40 MG tablet Take 40 mg by mouth daily.    . traZODone (DESYREL) 100 MG tablet Take 150 mg by mouth at bedtime. Takes 2 at bedtime     No current facility-administered medications on file prior to visit.      Review of Systems  Constitutional:   No  weight loss, night sweats,  Fevers, chills, + fatigue, or  lassitude.  HEENT:  No headaches,  Difficulty swallowing,  Tooth/dental problems, or  Sore throat,                No sneezing, itching, ear ache, nasal congestion, post nasal drip,   CV:  No chest pain,  Orthopnea, PND,  , anasarca, dizziness, palpitations, syncope.   GI  No heartburn, indigestion, abdominal pain, nausea, vomiting, diarrhea, change in bowel habits, loss of appetite, bloody stools.   Resp:   No excess mucus, no productive cough,  No non-productive cough,  No coughing up of blood.  No change in color of mucus.  No wheezing.  No chest wall deformity  Skin: no rash or lesions.  GU: no dysuria, change in color of urine, no urgency or frequency.  No flank pain, no hematuria   MS:  No joint pain or swelling.  No decreased range of motion.  No back pain.  Psych:  No change in mood or affect. No depression or anxiety.  No memory loss.          Objective:    Physical Exam   Vitals:   12/31/15 1534  BP: 138/76  Pulse: (!) 115  SpO2: 95%  Weight: 174 lb 6.4 oz (79.1 kg)  Height: _0  (1.549 m)   GEN: A/Ox3; pleasant , NAD, well nourished    HEENT:  [place]/AT,  EACs-clear, TMs-wnl, NOSE-clear, THROAT-clear, no lesions, no postnasal drip or exudate noted.   NECK:  Supple w/ fair ROM; no JVD; normal carotid impulses w/o bruits; no thyromegaly or nodules palpated; no lymphadenopathy.    RESP  Clear  P & A; w/o, wheezes/ rales/ or rhonchi. no accessory muscle use, no dullness to percussion  CARD:  RRR, no m/r/g  , no peripheral edema, pulses intact, no cyanosis or clubbing.  GI:   Soft & nt; nml bowel sounds; no organomegaly or masses detected.   Musco: Warm bil, no deformities or joint swelling noted.   Neuro: alert, no focal deficits noted.    Skin: Warm, no lesions or rashes     Tammy Parrett NP-C  [facility]  12/31/2015

## 2016-01-01 DIAGNOSIS — C3490 Malignant neoplasm of unspecified part of unspecified bronchus or lung: Secondary | ICD-10-CM | POA: Diagnosis not present

## 2016-01-06 ENCOUNTER — Other Ambulatory Visit: Payer: Self-pay | Admitting: Pulmonary Disease

## 2016-01-06 DIAGNOSIS — J45909 Unspecified asthma, uncomplicated: Secondary | ICD-10-CM | POA: Insufficient documentation

## 2016-01-06 NOTE — Assessment & Plan Note (Signed)
Recent flare now improving with QVAR   Plan  Patient Instructions  Refer to Oncology at [facility] for lung cancer follow up .  Set up a CT chest -lung cancer follow up .  Continue on QVAR , rinse after use .  Flu shot today .  Follow up with Dr. Halford Chessman in 6 months and As needed

## 2016-01-06 NOTE — Assessment & Plan Note (Addendum)
Hx of Stage 1 adenocarcinoma s/p RUL lobectomy 03/2014 . Needs new Oncologist as [facility] closed.  Set up for CT chest and referral to [facility] oncology

## 2016-01-11 ENCOUNTER — Ambulatory Visit (HOSPITAL_COMMUNITY)
Admission: RE | Admit: 2016-01-11 | Discharge: 2016-01-11 | Disposition: A | Discharge status: Home / Self Care | Payer: BLUE CROSS/BLUE SHIELD | Source: Ambulatory Visit | Attending: Adult Health | Admitting: Adult Health

## 2016-01-11 DIAGNOSIS — I700 Atherosclerosis of aorta: Secondary | ICD-10-CM | POA: Diagnosis not present

## 2016-01-11 DIAGNOSIS — Z9889 Other specified postprocedural states: Secondary | ICD-10-CM | POA: Insufficient documentation

## 2016-01-11 DIAGNOSIS — C3490 Malignant neoplasm of unspecified part of unspecified bronchus or lung: Secondary | ICD-10-CM | POA: Diagnosis not present

## 2016-01-11 DIAGNOSIS — J984 Other disorders of lung: Secondary | ICD-10-CM | POA: Diagnosis not present

## 2016-01-11 MED ORDER — IOPAMIDOL (ISOVUE-300) INJECTION 61%
75.0000 mL | Freq: Once | INTRAVENOUS | Status: AC | PRN
  Administered 2016-01-11: 75 mL via INTRAVENOUS

## 2016-01-14 NOTE — Progress Notes (Signed)
Spoke with pt and notified of results per Dr. Anner Crete. Pt verbalized understanding and denied any questions.

## 2016-02-01 DIAGNOSIS — C3490 Malignant neoplasm of unspecified part of unspecified bronchus or lung: Secondary | ICD-10-CM | POA: Diagnosis not present

## 2016-02-07 ENCOUNTER — Encounter (HOSPITAL_COMMUNITY): Payer: Self-pay | Admitting: Hematology & Oncology

## 2016-02-07 ENCOUNTER — Encounter (HOSPITAL_COMMUNITY): Payer: BLUE CROSS/BLUE SHIELD | Attending: Hematology & Oncology | Admitting: Hematology & Oncology

## 2016-02-07 VITALS — BP 152/76 | HR 94 | Temp 98.6°F | Resp 16 | Ht 61.0 in | Wt 176.5 lb

## 2016-02-07 DIAGNOSIS — Z79899 Other long term (current) drug therapy: Secondary | ICD-10-CM | POA: Insufficient documentation

## 2016-02-07 DIAGNOSIS — Z79811 Long term (current) use of aromatase inhibitors: Secondary | ICD-10-CM | POA: Diagnosis not present

## 2016-02-07 DIAGNOSIS — M79671 Pain in right foot: Secondary | ICD-10-CM

## 2016-02-07 DIAGNOSIS — T451X5A Adverse effect of antineoplastic and immunosuppressive drugs, initial encounter: Secondary | ICD-10-CM

## 2016-02-07 DIAGNOSIS — C3411 Malignant neoplasm of upper lobe, right bronchus or lung: Secondary | ICD-10-CM

## 2016-02-07 DIAGNOSIS — Z170 Estrogen receptor positive status [ER+]: Secondary | ICD-10-CM

## 2016-02-07 DIAGNOSIS — R232 Flushing: Secondary | ICD-10-CM

## 2016-02-07 DIAGNOSIS — C50911 Malignant neoplasm of unspecified site of right female breast: Secondary | ICD-10-CM | POA: Diagnosis not present

## 2016-02-07 DIAGNOSIS — Z789 Other specified health status: Secondary | ICD-10-CM

## 2016-02-07 DIAGNOSIS — M79672 Pain in left foot: Secondary | ICD-10-CM

## 2016-02-07 DIAGNOSIS — Z7289 Other problems related to lifestyle: Secondary | ICD-10-CM

## 2016-02-07 MED ORDER — ANASTROZOLE 1 MG PO TABS: 1.0000 mg | ORAL_TABLET | Freq: Every day | ORAL | 6 refills | Status: DC

## 2016-02-07 NOTE — Patient Instructions (Addendum)
[place] [facility] Discharge Instructions  RECOMMENDATIONS MADE BY THE CONSULTANT AND ANY TEST RESULTS WILL BE SENT TO YOUR REFERRING PHYSICIAN.  You were seen today by Dr. Whitney Muse. Labs tomorrow. Podiatry referral sent. Rx for Arimidex sent to your pharmacy.  Return in 6 months for follow up.    Thank you for choosing [place] at [facility] to provide your oncology and hematology care.  To afford each patient quality time with our provider, please arrive at least 15 minutes before your scheduled appointment time.    If you have a lab appointment with the [facility] please come in thru the  Main Entrance and check in at the main information desk  You need to re-schedule your appointment should you arrive 10 or more minutes late.  We strive to give you quality time with our providers, and arriving late affects you and other patients whose appointments are after yours.  Also, if you no show three or more times for appointments you may be dismissed from the clinic at the providers discretion.     Again, thank you for choosing [facility].  Our hope is that these requests will decrease the amount of time that you wait before being seen by our physicians.       _____________________________________________________________  Should you have questions after your visit to [facility], please contact our office at [phone] between the hours of 8:30 a.m. and 4:30 p.m.  Voicemails left after 4:30 p.m. will not be returned until the following business day.  For prescription refill requests, have your pharmacy contact our office.       Resources For Cancer Patients and their Caregivers ? American Cancer Society: Can assist with transportation, wigs, general needs, runs Look Good Feel Better.        601-358-3950 ? Cancer Care: Provides financial assistance, online support groups, medication/co-pay  assistance.  1-800-813-HOPE 6418615325) ? [place] Assists [place] cancer patients and their families through emotional , educational and financial support.  (847)138-5715 ? [place] DSS Where to apply for food stamps, Medicaid and utility assistance. 520 697 7280 ? [facility]: Transportation to medical appointments. 918-732-5128 ? Social Security Administration: May apply for disability if have a Stage IV cancer. 7608575858 302-599-4058 ? [employer], Disability and Transit Services: Assists with nutrition, care and transit needs. [place] Support Programs: '@10RELATIVEDAYS'$ @ > Cancer Support Group  2nd Tuesday of the month 1pm-2pm, Journey Room  > Creative Journey  3rd Tuesday of the month 1130am-1pm, Journey Room  > Look Good Feel Better  1st Wednesday of the month 10am-12 noon, Journey Room (Call [place] to register [phone])

## 2016-02-08 ENCOUNTER — Encounter (HOSPITAL_COMMUNITY): Payer: BLUE CROSS/BLUE SHIELD

## 2016-02-08 DIAGNOSIS — Z79899 Other long term (current) drug therapy: Secondary | ICD-10-CM

## 2016-02-08 DIAGNOSIS — C50911 Malignant neoplasm of unspecified site of right female breast: Secondary | ICD-10-CM

## 2016-02-08 LAB — CBC WITH DIFFERENTIAL/PLATELET
BASOS ABS: 0 10*3/uL (ref 0.0–0.1)
BASOS PCT: 0 %
Eosinophils Absolute: 0.1 10*3/uL (ref 0.0–0.7)
Eosinophils Relative: 2 %
HCT: 34.7 % — ABNORMAL LOW (ref 36.0–46.0)
Hemoglobin: 11.9 g/dL — ABNORMAL LOW (ref 12.0–15.0)
Lymphocytes Relative: 32 %
Lymphs Abs: 2 10*3/uL (ref 0.7–4.0)
MCH: 31.3 pg (ref 26.0–34.0)
MCHC: 34.3 g/dL (ref 30.0–36.0)
MCV: 91.3 fL (ref 78.0–100.0)
MONO ABS: 0.4 10*3/uL (ref 0.1–1.0)
Monocytes Relative: 7 %
NEUTROS ABS: 3.7 10*3/uL (ref 1.7–7.7)
Neutrophils Relative %: 59 %
PLATELETS: 269 10*3/uL (ref 150–400)
RBC: 3.8 MIL/uL — ABNORMAL LOW (ref 3.87–5.11)
RDW: 15 % (ref 11.5–15.5)
WBC: 6.3 10*3/uL (ref 4.0–10.5)

## 2016-02-08 LAB — COMPREHENSIVE METABOLIC PANEL
ALBUMIN: 4.4 g/dL (ref 3.5–5.0)
ALT: 28 U/L (ref 14–54)
AST: 28 U/L (ref 15–41)
Alkaline Phosphatase: 91 U/L (ref 38–126)
Anion gap: 9 (ref 5–15)
BUN: 16 mg/dL (ref 6–20)
CHLORIDE: 97 mmol/L — AB (ref 101–111)
CO2: 26 mmol/L (ref 22–32)
Calcium: 9.4 mg/dL (ref 8.9–10.3)
Creatinine, Ser: 0.85 mg/dL (ref 0.44–1.00)
GFR calc Af Amer: >60 mL/min (ref >60)
GFR calc non Af Amer: >60 mL/min (ref >60)
GLUCOSE: 104 mg/dL — AB (ref 65–99)
POTASSIUM: 4 mmol/L (ref 3.5–5.1)
Sodium: 132 mmol/L — ABNORMAL LOW (ref 135–145)
Total Bilirubin: 0.5 mg/dL (ref 0.3–1.2)
Total Protein: 6.8 g/dL (ref 6.5–8.1)

## 2016-02-08 LAB — RETICULOCYTES
RBC.: 3.88 MIL/uL (ref 3.87–5.11)
RETIC COUNT ABSOLUTE: 58.2 10*3/uL (ref 19.0–186.0)
Retic Ct Pct: 1.5 % (ref 0.4–3.1)

## 2016-02-08 LAB — LACTATE DEHYDROGENASE: LDH: 160 U/L (ref 98–192)

## 2016-02-09 LAB — IRON AND TIBC
IRON: 129 ug/dL (ref 28–170)
Saturation Ratios: 36 % — ABNORMAL HIGH (ref 10.4–31.8)
TIBC: 363 ug/dL (ref 250–450)
UIBC: 234 ug/dL

## 2016-02-09 LAB — FOLATE: Folate: 7.2 ng/mL (ref >5.9)

## 2016-02-09 LAB — FERRITIN: FERRITIN: 301 ng/mL (ref 11–307)

## 2016-02-09 LAB — VITAMIN B12: Vitamin B-12: 362 pg/mL (ref 180–914)

## 2016-02-11 ENCOUNTER — Ambulatory Visit (HOSPITAL_COMMUNITY)
Admission: RE | Admit: 2016-02-11 | Discharge: 2016-02-11 | Disposition: A | Discharge status: Home / Self Care | Payer: BLUE CROSS/BLUE SHIELD | Source: Ambulatory Visit | Attending: Hematology & Oncology | Admitting: Hematology & Oncology

## 2016-02-11 ENCOUNTER — Other Ambulatory Visit (HOSPITAL_COMMUNITY): Payer: Self-pay | Admitting: Emergency Medicine

## 2016-02-11 DIAGNOSIS — C50911 Malignant neoplasm of unspecified site of right female breast: Secondary | ICD-10-CM | POA: Diagnosis not present

## 2016-02-11 DIAGNOSIS — Z79899 Other long term (current) drug therapy: Secondary | ICD-10-CM | POA: Diagnosis not present

## 2016-02-11 DIAGNOSIS — M8588 Other specified disorders of bone density and structure, other site: Secondary | ICD-10-CM | POA: Diagnosis not present

## 2016-02-11 NOTE — Progress Notes (Signed)
BCI sent on pt to [facility], faxed confirmed.

## 2016-02-23 ENCOUNTER — Encounter (HOSPITAL_COMMUNITY): Payer: Self-pay | Admitting: Hematology & Oncology

## 2016-02-23 ENCOUNTER — Telehealth (HOSPITAL_COMMUNITY): Payer: Self-pay | Admitting: Emergency Medicine

## 2016-02-23 NOTE — Telephone Encounter (Signed)
Called about BCI, [facility] stated that they were trying to get a hold of the patient, letter sent to her on 02/18/2016.

## 2016-02-23 NOTE — Progress Notes (Signed)
[place]  CONSULT NOTE  Patient Care Team: Celedonio Savage, MD as PCP - General (Family Medicine) Everardo All, MD as Consulting Physician (Oncology)  CHIEF COMPLAINTS/PURPOSE OF CONSULTATION:  Stage I ER+ HER 2 - R breast cancer RUL adenocarcinoma, Stage IA  HISTORY OF PRESENTING ILLNESS:  Gabrielle [name] 61 y.o. female is here to establish ongoing care for a history of stage I carcinoma of the R breast and a stage IA adenocarcinoma of the RUL.   She was diagnosed with breast cancer in 04/2012. Tumor was ER+, HER 2 - and by available records had a low oncotype DX. She underwent lumpectomy and radiation. She is on arimidex which she notes that she takes daily and tolerates well. She has occasional hot flashes which she describes as tolerable. She has hip pain but notes she had this prior to her AI therapy. She is due for another mammogram in May 2018. Her surgery for her breast cancer was in [place].  Her lung cancer was found incidentally in imaging in February of 2016. She was treated with surgical resection by Dr. Verlene Mayer.  04/20/2014: Video bronchoscopy, left video-assisted thoracoscopy with wedge resection of right upper lobe lung nodule, and subsequent completion right upper lobectomy with lymph node dissection, placement of On-Q, and lymph node dissection.  SURGEON:  Lanelle Bal, MD Final staging was stage IA (pT1b,pN0) adenocarcinoma, RUL, negative margins, negative LVI.  She was last seen by Dr. Tressie Stalker at [facility] on 05/10/2015. She presents today for ongoing care of the 2 above malignancies.   Her major concern today is her inability to stay physically active as she notes problems with her feet. She describes "nodules" along the lateral aspect of her feet that make it difficult to ambulate for any distance.    MEDICAL HISTORY:  Past Medical History:  Diagnosis Date  . Bipolar depression ([place])   . Breast cancer ([place]) 04/2012   right  . Carpal tunnel  syndrome   . Chronic pain in left foot   . Complication of anesthesia    pt says she gags really bad from secreation and reflux  . Cough   . Diarrhea   . GERD (gastroesophageal reflux disease)   . H/O eating disorder   . History of anemia   . Hyperlipidemia   . Invasive ductal carcinoma of right breast ([place]) 08/21/2012  . Orthostatic hypotension   . Pneumonia   . S/P bilateral breast implants 08/21/2012   Prior to cancer diagnosis  . Tobacco abuse 08/21/2012   > 40 pack years    SURGICAL HISTORY: Past Surgical History:  Procedure Laterality Date  . ABDOMINAL HYSTERECTOMY     vaginal - still both ovaries  . BREAST LUMPECTOMY Left 1990   [facility]  . BREAST LUMPECTOMY WITH SENTINEL LYMPH NODE BIOPSY Right 04/2012  . LOBECTOMY Right 04/20/2014   Procedure: LOBECTOMY, RIGHT UPPER LOBE WITH PLACEMENT OF ONQ PAIN PUMP;  Surgeon: Grace Isaac, MD;  Location: [place];  Service: Thoracic;  Laterality: Right;  . NODE DISSECTION Right 04/20/2014   Procedure: NODE DISSECTION;  Surgeon: Grace Isaac, MD;  Location: [place];  Service: Thoracic;  Laterality: Right;  . TONSILLECTOMY    . VIDEO ASSISTED THORACOSCOPY (VATS)/WEDGE RESECTION Right 04/20/2014   Procedure: VIDEO ASSISTED THORACOSCOPY ;  Surgeon: Grace Isaac, MD;  Location: [place];  Service: Thoracic;  Laterality: Right;  Marland Kitchen VIDEO BRONCHOSCOPY N/A 04/20/2014   Procedure: VIDEO BRONCHOSCOPY;  Surgeon: Grace Isaac, MD;  Location: [facility];  Service: Thoracic;  Laterality: N/A;  . WEDGE RESECTION Right 04/20/2014   Procedure: WEDGE RESECTION, RIGHT UPPER LOBE;  Surgeon: Grace Isaac, MD;  Location: [place];  Service: Thoracic;  Laterality: Right;    SOCIAL HISTORY: Social History   Social History  . Marital status: Divorced    Spouse name: N/A  . Number of children: 1  . Years of education: N/A   Occupational History  .  [employer]   Social History Main Topics  . Smoking status: Former Smoker     Packs/day: 1.00    Years: 40.00    Types: Cigarettes    Quit date: 04/20/2014  . Smokeless tobacco: Never Used  . Alcohol use 12.0 oz/week    20 Cans of beer per week     Comment: drinks 3 or 4 beers 3 - 4 times weekly  . Drug use: No     Comment: history of substance abuse - none now  . Sexual activity: No   Other Topics Concern  . Not on file   Social History Narrative  . No narrative on file   Divorced. Lives with X husband. One child. No grandchildren. Quit smoking when diagnosed with lung cancer. 40 pack year history. No ETOH problems but she thinks that she drinks too much. 2 glasses of wine each night. Medical transcriptionist for 12 years Has a dog   FAMILY HISTORY: Family History  Problem Relation Age of Onset  . Hypertension Mother   . Hypertension Father   . Lymphoma Maternal Aunt 77  . Leukemia Maternal Grandmother     dx in her late 83s   Father is alive at 77, thyroid problems, HTN, prostate issues Mother is deceased at 70, psychiatric disease, she died from medical complications from her disease. 3B, 3S all healthy  ALLERGIES:  is allergic to hydrocodone and metronidazole.  MEDICATIONS:  Current Outpatient Prescriptions  Medication Sig Dispense Refill  . acetaminophen (TYLENOL) 500 MG tablet Take 1,000 mg by mouth 2 (two) times daily. Takes 2 bid    . anastrozole (ARIMIDEX) 1 MG tablet Take 1 tablet (1 mg total) by mouth daily. 30 tablet 3  . beclomethasone (QVAR) 80 MCG/ACT inhaler Inhale 2 puffs into the lungs 2 (two) times daily. 1 Inhaler 5  . gabapentin (NEURONTIN) 800 MG tablet Take 800 mg by mouth 3 (three) times daily.     . hydrOXYzine (ATARAX/VISTARIL) 50 MG tablet Take 50 mg by mouth at bedtime.    Marland Kitchen ibuprofen (ADVIL,MOTRIN) 800 MG tablet Take 800 mg by mouth daily.     Marland Kitchen lamoTRIgine (LAMICTAL) 25 MG tablet Take 100 mg by mouth daily.     Marland Kitchen omeprazole (PRILOSEC) 20 MG capsule Take 20 mg by mouth daily.    . pravastatin (PRAVACHOL) 40 MG  tablet Take 40 mg by mouth daily.    . traZODone (DESYREL) 100 MG tablet Take 150 mg by mouth at bedtime. Takes 2 at bedtime    . VENTOLIN HFA 108 (90 Base) MCG/ACT inhaler INHALE TWO PUFFS BY MOUTH EVERY 6 HOURS AS NEEDED FOR SHORTNESS OF BREATH 18 g 5  . anastrozole (ARIMIDEX) 1 MG tablet Take 1 tablet (1 mg total) by mouth daily. 30 tablet 6   No current facility-administered medications for this visit.     Review of Systems  Constitutional: Negative for chills, fever, malaise/fatigue and weight loss.  HENT: Negative for congestion, hearing loss, nosebleeds, sore throat and tinnitus.   Eyes:  Negative for blurred vision, double vision, pain and discharge.  Respiratory: Positive for shortness of breath. Negative for cough, hemoptysis, sputum production and wheezing.   Cardiovascular: Negative for chest pain, palpitations, claudication, leg swelling and PND.  Gastrointestinal: Negative for abdominal pain, blood in stool, constipation, diarrhea, heartburn, melena, nausea and vomiting.  Genitourinary: Negative for dysuria, frequency, hematuria and urgency.  Musculoskeletal: Positive for joint pain. Negative for falls and myalgias.       Bilateral foot pain  Skin: Negative for itching and rash.  Neurological: Negative for dizziness, tingling, tremors, sensory change, speech change, focal weakness, seizures, loss of consciousness, weakness and headaches.  Endo/Heme/Allergies: Does not bruise/bleed easily.  Psychiatric/Behavioral: Negative for depression, memory loss, substance abuse and suicidal ideas. The patient is not nervous/anxious and does not have insomnia.    14 point ROS was done and is otherwise as detailed above or in HPI   PHYSICAL EXAMINATION: ECOG PERFORMANCE STATUS: 1 - Symptomatic but completely ambulatory  Vitals:   02/07/16 1545  BP: (!) 152/76  Pulse: 94  Resp: 16  Temp: 98.6 F (37 C)   Filed Weights   02/07/16 1545  Weight: 176 lb 8 oz (80.1 kg)      Physical Exam  Constitutional: She is oriented to person, place, and time and well-developed, well-nourished, and in no distress.  HENT:  Head: Normocephalic and atraumatic.  Nose: Nose normal.  Mouth/Throat: Oropharynx is clear and moist. No oropharyngeal exudate.  Eyes: Conjunctivae and EOM are normal. Pupils are equal, round, and reactive to light. Right eye exhibits no discharge. Left eye exhibits no discharge. No scleral icterus.  Neck: Normal range of motion. Neck supple. No tracheal deviation present. No thyromegaly present.  Cardiovascular: Normal rate, regular rhythm and normal heart sounds.  Exam reveals no gallop and no friction rub.   No murmur heard. Pulmonary/Chest: Effort normal and breath sounds normal. She has no wheezes. She has no rales.  Abdominal: Soft. Bowel sounds are normal. She exhibits no distension and no mass. There is no tenderness. There is no rebound and no guarding.  Musculoskeletal: Normal range of motion. She exhibits no edema.  Lymphadenopathy:    She has no cervical adenopathy.  Neurological: She is alert and oriented to person, place, and time. She has normal reflexes. No cranial nerve deficit. Gait normal. Coordination normal.  Skin: Skin is warm and dry. No rash noted.  Psychiatric: Mood, memory, affect and judgment normal.  Nursing note and vitals reviewed. Breast exam is negative for masses on the left breast. The right breast has an implant without obvious mass in the breast as well. The right breast is clinically more firm than the left.  LABORATORY DATA:  I have reviewed the data as listed Lab Results  Component Value Date   WBC 6.3 02/08/2016   HGB 11.9 (L) 02/08/2016   HCT 34.7 (L) 02/08/2016   MCV 91.3 02/08/2016   PLT 269 02/08/2016   CMP     Component Value Date/Time   NA 132 (L) 02/08/2016 1545   K 4.0 02/08/2016 1545   CL 97 (L) 02/08/2016 1545   CO2 26 02/08/2016 1545   GLUCOSE 104 (H) 02/08/2016 1545   BUN 16  02/08/2016 1545   CREATININE 0.85 02/08/2016 1545   CALCIUM 9.4 02/08/2016 1545   PROT 6.8 02/08/2016 1545   ALBUMIN 4.4 02/08/2016 1545   AST 28 02/08/2016 1545   ALT 28 02/08/2016 1545   ALKPHOS 91 02/08/2016 1545   BILITOT 0.5 02/08/2016  [place] 02/08/2016 1545   GFRAA >60 02/08/2016 1545     RADIOGRAPHIC STUDIES: I have personally reviewed the radiological images as listed and agreed with the findings in the report. No results found. Study Result   CLINICAL DATA:  History of right upper lobe lung cancer post surgical resection in 2014. History of right breast cancer. Chronic shortness of breath.  EXAM: CT CHEST WITH CONTRAST  TECHNIQUE: Multidetector CT imaging of the chest was performed during intravenous contrast administration.  CONTRAST:  7m ISOVUE-300 IOPAMIDOL (ISOVUE-300) INJECTION 61%  COMPARISON:  Chest CT 04/08/2015 and 10/02/2014  FINDINGS: Cardiovascular: Atherosclerosis of the aorta, great vessels and coronary arteries again noted. No acute vascular findings are seen. The heart size is normal. There is no pericardial effusion.  Mediastinum/Nodes: No enlarged mediastinal, hilar, internal mammary or axillary lymph nodes. The thyroid gland, trachea and esophagus demonstrate no significant findings.  Lungs/Pleura: There is no pleural effusion. There is stable scarring at the right lung apex status post right upper lobe resection. No airspace disease, endobronchial lesion or suspicious pulmonary nodule. Stable mild nodularity along the superior aspect of the left major fissure (image 35).  Upper abdomen: The visualized upper abdomen appears stable without suspicious findings. There are probable bilateral renal cysts. Aortic atherosclerosis noted.  Musculoskeletal/Chest wall: Stable capsular calcification and contour irregularity of the bilateral breast implants. No evidence of chest wall recurrence. No acute or worrisome  osseous findings. Stable old rib fractures or thoracotomy defects bilaterally.  IMPRESSION: 1. Stable postoperative chest CT. 2. No evidence of local recurrence or metastatic disease. 3. Moderate atherosclerosis, as before.   Electronically Signed   By: WRichardean SaleM.D.   On: 01/12/2016 09:36    ASSESSMENT & PLAN:  Cancer Staging Invasive ductal carcinoma of right breast (HCC) Staging form: Breast, AJCC 7th Edition - Clinical: Stage IA (T1c, N0, cM0) - Signed by TBaird Cancer PA-C on 08/21/2012 - Pathologic: Stage IA (T1c, N0, cM0) - Unsigned  Lung cancer, right upper lobe Staging form: Lung, AJCC 7th Edition - Pathologic stage from 04/23/2014: Stage IA (T1b, N0, cM0) - Signed by EGrace Isaac MD on 04/24/2014 Anemia Foot Pain Long term use of aromatase inhibitor Aromatase inhibitor associated hot flashes  Will continue on Arimidex. She is due for a bone density. She is encouraged to take calcium and vitamin D daily. Will keep her apprised of the results of her DEXA.  In regards to her foot pain will refer to podiatry. She is up to date on mammography.  I have encouraged her to decrease her ETOH intacke.   I have ordered an anemia panel today. Per Dr. NJaclyn Primenotes her anemia had gotten slightly worse over the past year.  She will need CT imaging of the chest for follow-up of her lung cancer in accordance with NCCN guidelines. Last Chest CT was 12/20. Results are noted above.  NCCN guidelines for Non-Small Cell Lung Cancer Surveillance in the setting of clinical/radiographic remission are as follows (4.2017):  A. Stage I-II (primary treatment included surgery +/- chemotherapy):   1. H+P and chest CT +/- contrast every 6 months for 2-3 years, then H+P and low-dose non-contrast-enhanced chest CT annually  B. Stage I-II (primary treatment included RT) or Stage III or Stage IV (oligometastatic with all sites treated with definitive intent)   1. H+P and chest CT  +/- contrast every 3-6 months for 3 years, then H+P and chest CT +/- contrast every 6 months for 2  years, then H+P and low-dose non-contrast-enhanced chest CT annually    A. Residual or new radiographic abnormalities may require more frequent imaging  C. Smoking cessation advice, counseling, and pharmacotherapy  D. PET/CT or Brain MRI is not routinely indicated.  NCCN guidelines recommends the following surveillance for invasive breast cancer:  A. History and Physical exam every 4-6 months for 5 years and then every 12 months.  B. Mammography every 12 months  C. Women on Tamoxifen: annual gynecologic assessment every 12 months if uterus is present.  D. Women on aromatase inhibitor or who experience ovarian failure secondary to treatment should have monitoring of bone health with a bone mineral density determination at baseline and periodically thereafter.  E. Assess and encourage adherence to adjuvant endocrine therapy.  F. Evidence suggests that active lifestyle and achieving and maintaining an ideal body weight (20-25 BMI) may lead to optimal breast cancer outcomes.  RTC 6 months. Arimidex was refilled today.   ORDERS PLACED FOR THIS ENCOUNTER: Orders Placed This Encounter  Procedures  . DG Bone Density  . CBC with Differential  . Comprehensive metabolic panel  . Lactate dehydrogenase  . Iron and TIBC  . Ferritin  . Vitamin B12  . Folate  . Reticulocytes    MEDICATIONS PRESCRIBED THIS ENCOUNTER: Meds ordered this encounter  Medications  . DISCONTD: hydrOXYzine (VISTARIL) 50 MG capsule    Refill:  11  . DISCONTD: traZODone (DESYREL) 150 MG tablet    Refill:  6  . anastrozole (ARIMIDEX) 1 MG tablet    Sig: Take 1 tablet (1 mg total) by mouth daily.    Dispense:  30 tablet    Refill:  6   All questions were answered. The patient knows to call the clinic with any problems, questions or concerns.  This note was electronically signed.    Molli Hazard, MD   02/23/2016 4:31 PM

## 2016-02-25 ENCOUNTER — Telehealth (HOSPITAL_COMMUNITY): Payer: Self-pay

## 2016-02-25 NOTE — Telephone Encounter (Signed)
Received communication from [facility] that patient has declined to proceed with the BCI test. Since the patient declined to proceed, the test will not be performed and no results will be issued. Kirby Crigler, PA-C, notified.

## 2016-03-03 DIAGNOSIS — C3490 Malignant neoplasm of unspecified part of unspecified bronchus or lung: Secondary | ICD-10-CM | POA: Diagnosis not present

## 2016-03-03 DIAGNOSIS — M21612 Bunion of left foot: Secondary | ICD-10-CM | POA: Diagnosis not present

## 2016-03-03 DIAGNOSIS — M722 Plantar fascial fibromatosis: Secondary | ICD-10-CM | POA: Diagnosis not present

## 2016-03-11 IMAGING — CR DG CHEST 1V PORT
1 series · 1 of 1 positions shown · non-contrast
Comparison: Portable chest x-ray April 23, 2014

CLINICAL DATA: Lung malignancy status post wedge resection/
lobectomy on April 20, 2014

EXAM:
PORTABLE CHEST - 1 VIEW

[AP]
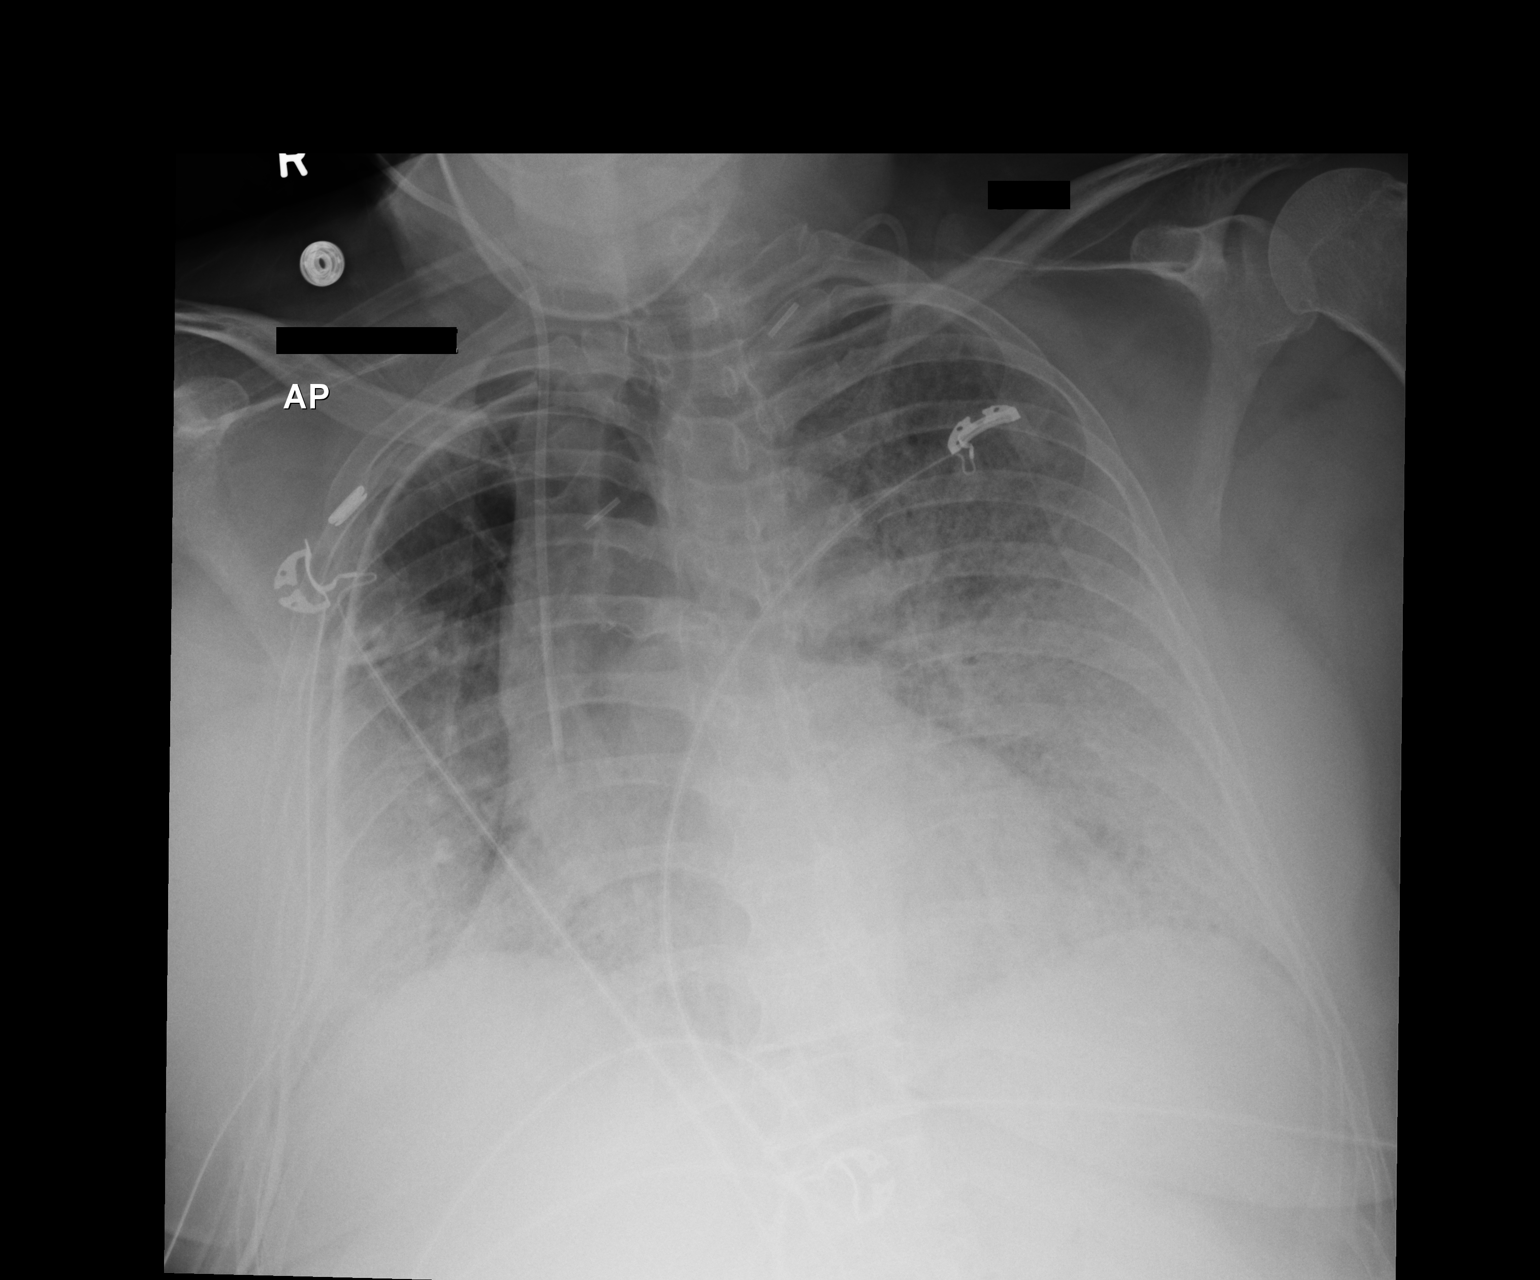

[1 of 1 positions shown; findings below may reference images not displayed]

FINDINGS: The lungs are reasonably well inflated. Diffuse alveolar opacities
persist throughout the left lung and to a lesser extent on the
right. There is mild shift of mediastinum toward the right
consistent with the previous right lobectomy. The right-sided upper
chest tube tip lies in the pulmonary apex. The lower chest tube tip
projects along the mid to upper lateral thoracic wall. There is no
pneumothorax. The cardiac silhouette remains enlarged. The internal
jugular venous catheter tip projects over the distal third of the
SVC.
IMPRESSION: Slight interval improvement in the appearance of the bilateral
diffuse airspace opacities consistent with re- solving pulmonary
edema and/or pneumonia. There is no pneumothorax. The support tubes
and lines are in appropriate position radiographically.

## 2016-03-12 IMAGING — CR DG CHEST 1V PORT
1 series · 1 of 1 positions shown · non-contrast
Comparison: 04/24/2014

CLINICAL DATA: Upper lobe lung cancer. Short of breath. Followup
exam.

EXAM:
PORTABLE CHEST - 1 VIEW

[portable]
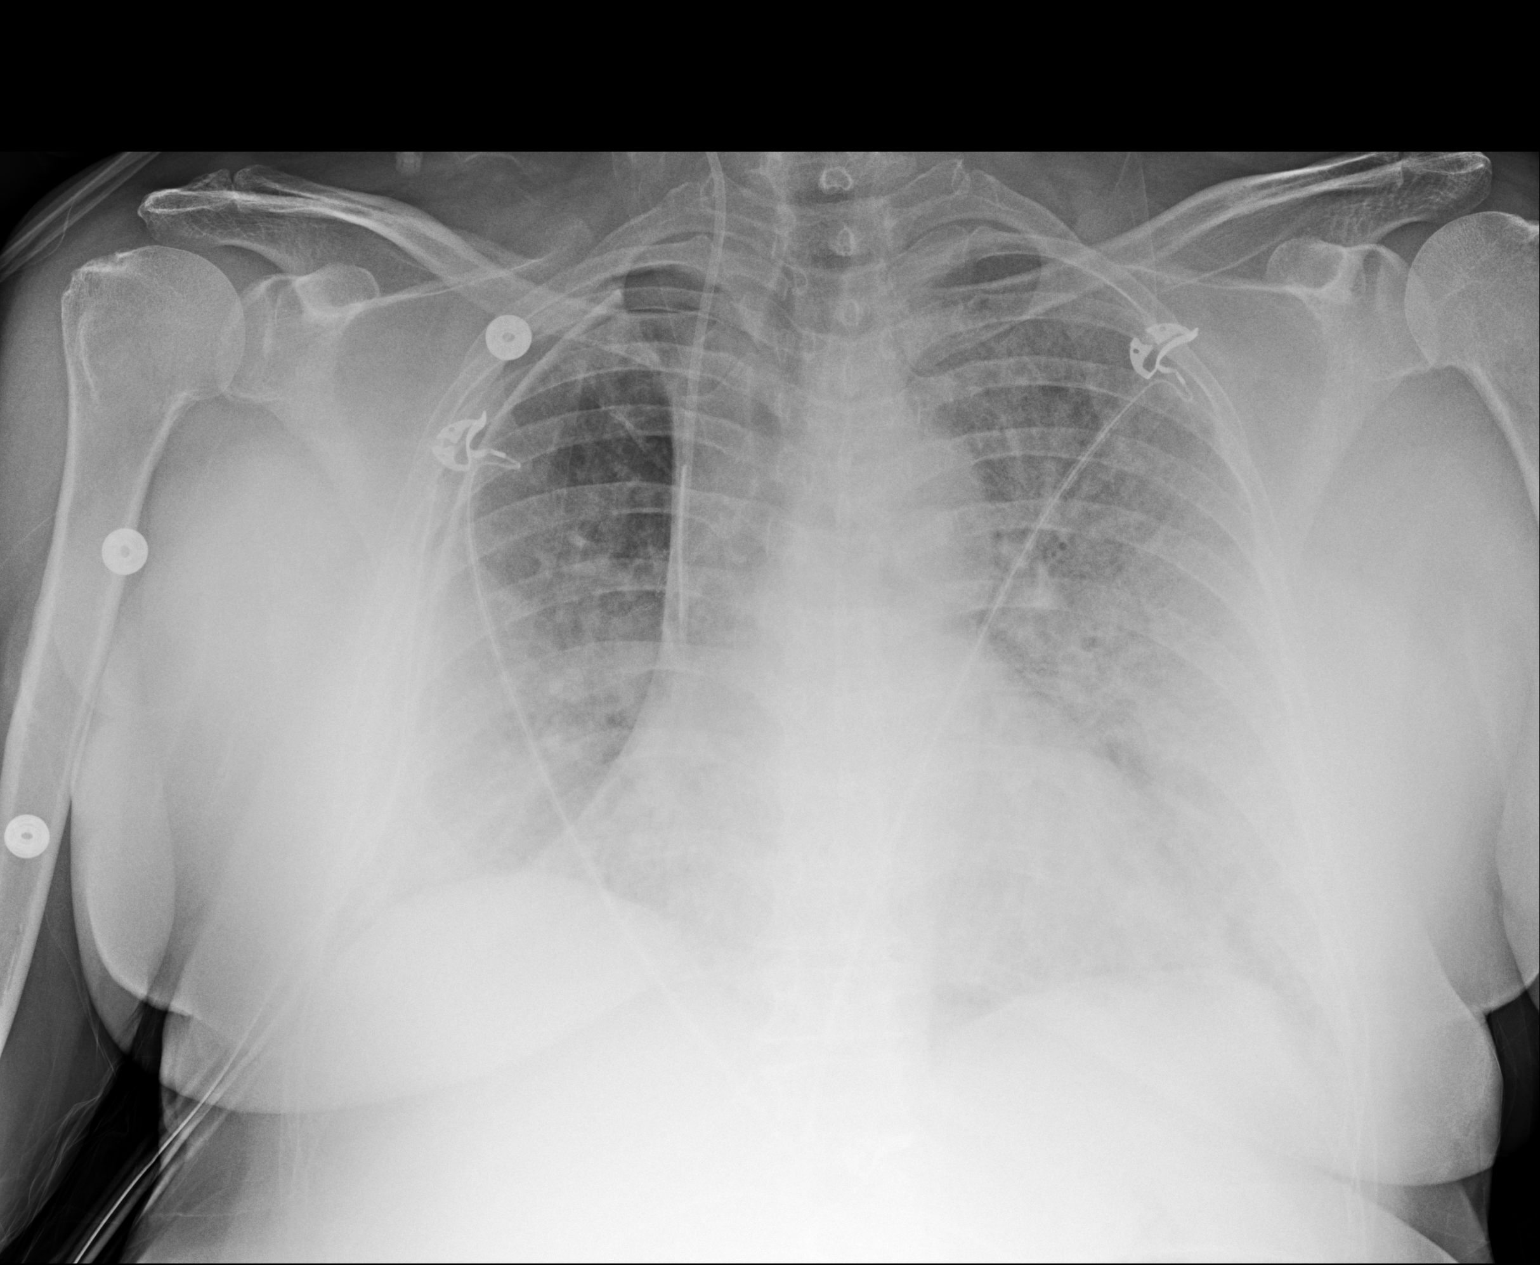

[1 of 1 positions shown; findings below may reference images not displayed]

FINDINGS: Right-sided chest tubes are stable. No pneumothorax. Hazy airspace
lung opacities, more prominent on the left, are without change. No
new areas of lung opacity. No convincing pleural effusion.

Cardiac silhouette is mildly enlarged.  No mediastinal widening.

Right internal jugular central venous line is stable with its tip in
the lower superior vena cava.
IMPRESSION: 1. No change from the previous day's study.
2. Status post right lung surgery. Right-sided chest tubes are
stable. No pneumothorax.
3. Persistent hazy airspace lung opacities, also stable. No new
abnormalities.

## 2016-03-14 IMAGING — CR DG CHEST 1V PORT
1 series · 1 of 1 positions shown · non-contrast
Comparison: April 26, 2014

CLINICAL DATA: Pulmonary edema.

EXAM:
PORTABLE CHEST - 1 VIEW

[AP]
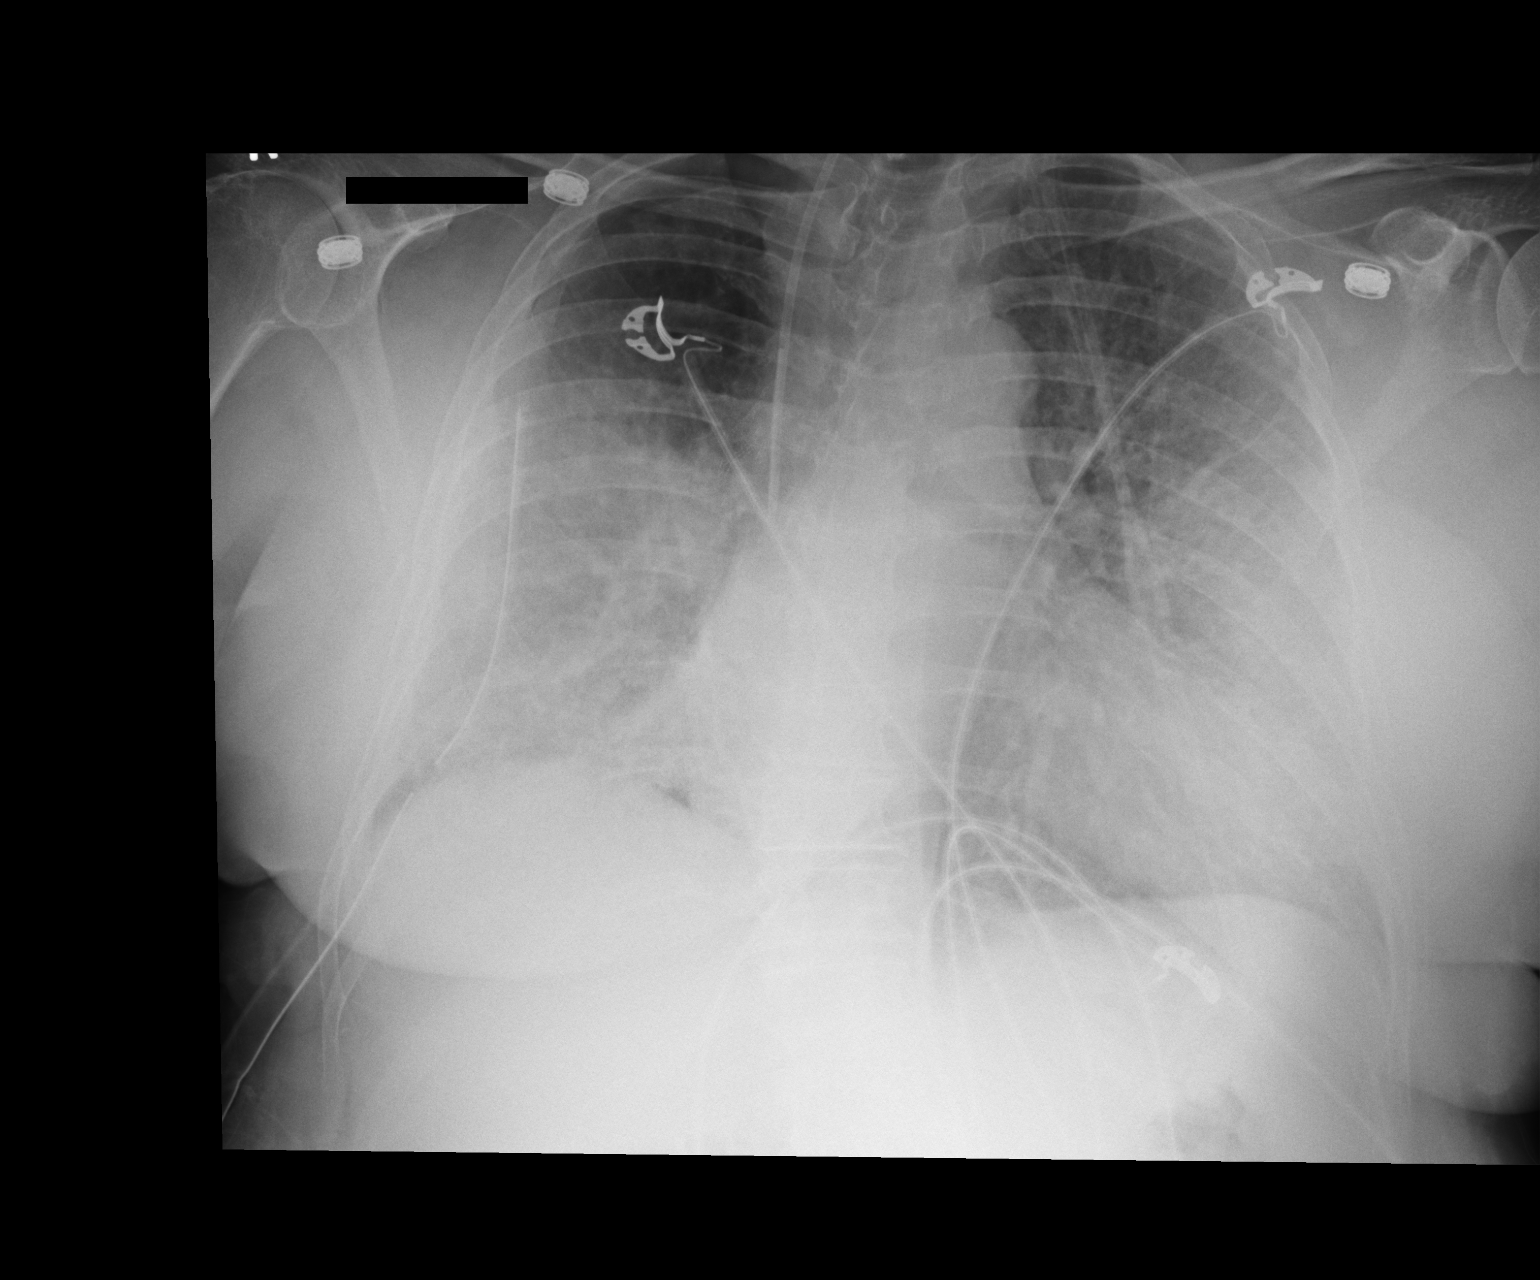

[1 of 1 positions shown; findings below may reference images not displayed]

FINDINGS: Chest tube remains on the right, although the tip of the right chest
tube is more inferiorly position compared to 1 day prior. There is a
small right apical pneumothorax which appears essentially stable
compared to 1 day prior. No tension component. Central catheter tip
is in the superior vena cava, stable. There remains generalized
interstitial pulmonary edema. There is no appreciable airspace
consolidation. Heart is mildly enlarged with mild pulmonary venous
hypertension. No adenopathy.
IMPRESSION: Small right apical pneumothorax with chest tube on the right
present. Stable interstitial edema consistent with a degree of
congestive heart failure. No new opacity. No change in cardiac
silhouette.

## 2016-03-15 IMAGING — DX DG CHEST 2V
2 series · 2 of 2 positions shown · non-contrast
Comparison: 04/27/2014

CLINICAL DATA: Shortness of breath, sore chest post lung surgery.
History of lung cancer.

EXAM:
CHEST  2 VIEW

[chest pa]
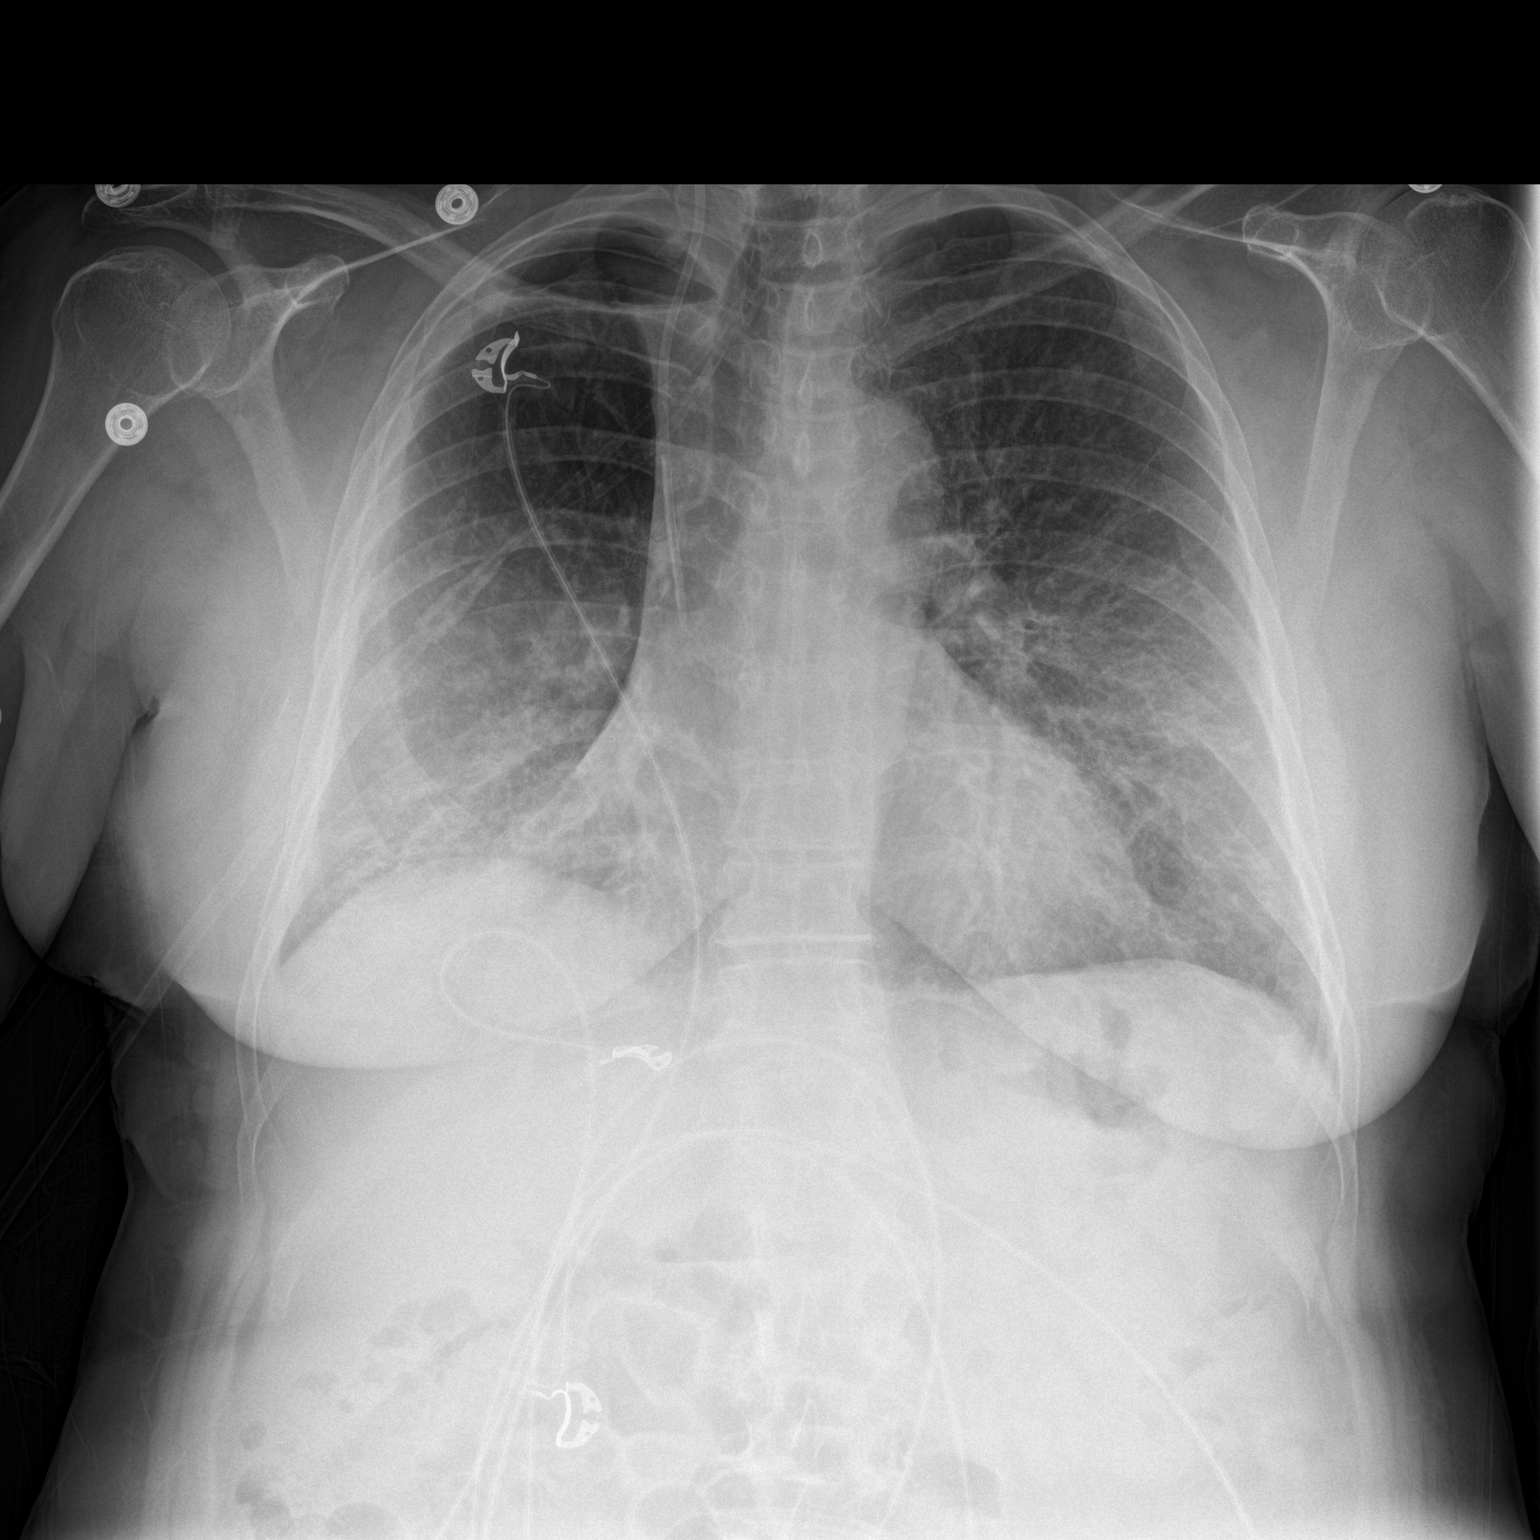

[chest lat]
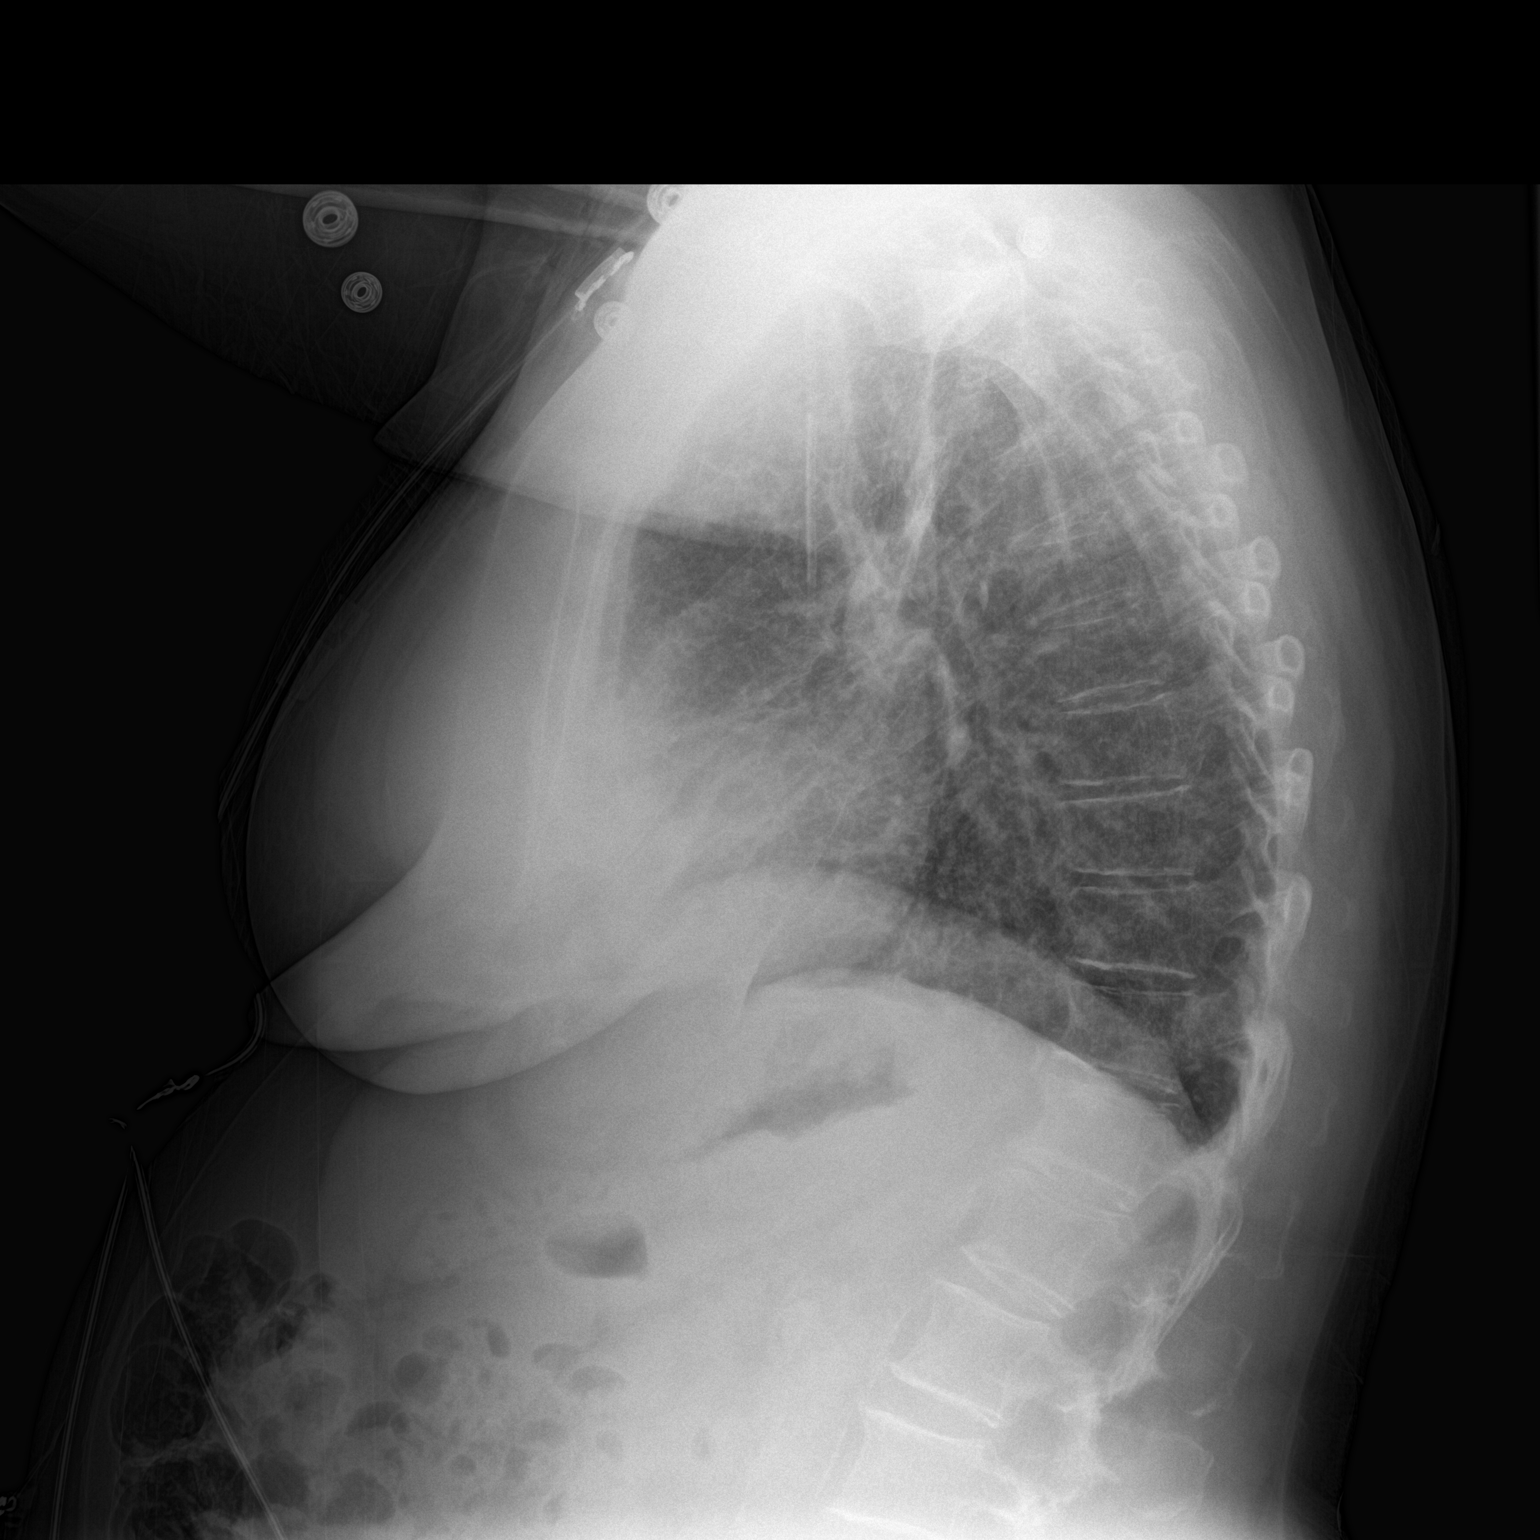

[2 of 2 positions shown; findings below may reference images not displayed]

FINDINGS: Right IJ central venous catheter unchanged. Lungs are adequately
inflated and demonstrate no change in a small right apical
pneumothorax. Interval worsening of mild perihilar opacification
suggesting worsening vascular congestion and less likely atelectasis
or infection. Cardiomediastinal silhouette and remainder of the exam
is unchanged.
IMPRESSION: Mild worsening perihilar opacification likely vascular congestion
and less likely atelectasis or infection.

Stable small right apical pneumothorax.

Right IJ central venous catheter unchanged.

## 2016-03-21 DIAGNOSIS — M21622 Bunionette of left foot: Secondary | ICD-10-CM | POA: Diagnosis not present

## 2016-03-21 DIAGNOSIS — M21612 Bunion of left foot: Secondary | ICD-10-CM | POA: Diagnosis not present

## 2016-03-21 DIAGNOSIS — D2372 Other benign neoplasm of skin of left lower limb, including hip: Secondary | ICD-10-CM | POA: Diagnosis not present

## 2016-03-21 DIAGNOSIS — M722 Plantar fascial fibromatosis: Secondary | ICD-10-CM | POA: Diagnosis not present

## 2016-03-31 DIAGNOSIS — C3490 Malignant neoplasm of unspecified part of unspecified bronchus or lung: Secondary | ICD-10-CM | POA: Diagnosis not present

## 2016-05-01 DIAGNOSIS — C3490 Malignant neoplasm of unspecified part of unspecified bronchus or lung: Secondary | ICD-10-CM | POA: Diagnosis not present

## 2016-05-31 DIAGNOSIS — C3490 Malignant neoplasm of unspecified part of unspecified bronchus or lung: Secondary | ICD-10-CM | POA: Diagnosis not present

## 2016-07-01 DIAGNOSIS — C3490 Malignant neoplasm of unspecified part of unspecified bronchus or lung: Secondary | ICD-10-CM | POA: Diagnosis not present

## 2016-07-31 DIAGNOSIS — C3490 Malignant neoplasm of unspecified part of unspecified bronchus or lung: Secondary | ICD-10-CM | POA: Diagnosis not present

## 2016-08-04 ENCOUNTER — Other Ambulatory Visit (HOSPITAL_COMMUNITY): Payer: Self-pay | Admitting: *Deleted

## 2016-08-04 DIAGNOSIS — C3410 Malignant neoplasm of upper lobe, unspecified bronchus or lung: Secondary | ICD-10-CM

## 2016-08-04 DIAGNOSIS — C50911 Malignant neoplasm of unspecified site of right female breast: Secondary | ICD-10-CM

## 2016-08-07 ENCOUNTER — Ambulatory Visit (HOSPITAL_COMMUNITY): Payer: BLUE CROSS/BLUE SHIELD

## 2016-08-07 ENCOUNTER — Other Ambulatory Visit (HOSPITAL_COMMUNITY): Payer: BLUE CROSS/BLUE SHIELD

## 2016-08-07 ENCOUNTER — Ambulatory Visit (HOSPITAL_COMMUNITY): Payer: BLUE CROSS/BLUE SHIELD | Admitting: Hematology & Oncology

## 2016-08-31 DIAGNOSIS — C3490 Malignant neoplasm of unspecified part of unspecified bronchus or lung: Secondary | ICD-10-CM | POA: Diagnosis not present

## 2016-09-05 DIAGNOSIS — C3490 Malignant neoplasm of unspecified part of unspecified bronchus or lung: Secondary | ICD-10-CM | POA: Diagnosis not present

## 2016-09-05 DIAGNOSIS — Z170 Estrogen receptor positive status [ER+]: Secondary | ICD-10-CM | POA: Diagnosis not present

## 2016-09-05 DIAGNOSIS — Z6833 Body mass index (BMI) 33.0-33.9, adult: Secondary | ICD-10-CM | POA: Diagnosis not present

## 2016-09-05 DIAGNOSIS — C50211 Malignant neoplasm of upper-inner quadrant of right female breast: Secondary | ICD-10-CM | POA: Diagnosis not present

## 2016-09-13 DIAGNOSIS — Z170 Estrogen receptor positive status [ER+]: Secondary | ICD-10-CM | POA: Diagnosis not present

## 2016-09-13 DIAGNOSIS — C50811 Malignant neoplasm of overlapping sites of right female breast: Secondary | ICD-10-CM | POA: Diagnosis not present

## 2016-09-13 DIAGNOSIS — R928 Other abnormal and inconclusive findings on diagnostic imaging of breast: Secondary | ICD-10-CM | POA: Diagnosis not present

## 2016-09-13 DIAGNOSIS — Z9889 Other specified postprocedural states: Secondary | ICD-10-CM | POA: Diagnosis not present

## 2016-10-01 DIAGNOSIS — C3490 Malignant neoplasm of unspecified part of unspecified bronchus or lung: Secondary | ICD-10-CM | POA: Diagnosis not present

## 2016-10-05 ENCOUNTER — Telehealth: Payer: Self-pay

## 2016-10-05 DIAGNOSIS — I2510 Atherosclerotic heart disease of native coronary artery without angina pectoris: Secondary | ICD-10-CM | POA: Diagnosis not present

## 2016-10-05 DIAGNOSIS — C3490 Malignant neoplasm of unspecified part of unspecified bronchus or lung: Secondary | ICD-10-CM | POA: Diagnosis not present

## 2016-10-05 DIAGNOSIS — Z170 Estrogen receptor positive status [ER+]: Secondary | ICD-10-CM | POA: Diagnosis not present

## 2016-10-05 DIAGNOSIS — I700 Atherosclerosis of aorta: Secondary | ICD-10-CM | POA: Diagnosis not present

## 2016-10-05 DIAGNOSIS — C50211 Malignant neoplasm of upper-inner quadrant of right female breast: Secondary | ICD-10-CM | POA: Diagnosis not present

## 2016-10-05 MED ORDER — BECLOMETHASONE DIPROP HFA 80 MCG/ACT IN AERB: 2.0000 | INHALATION_SPRAY | Freq: Two times a day (BID) | RESPIRATORY_TRACT | 2 refills | Status: DC

## 2016-10-05 NOTE — Telephone Encounter (Signed)
Sounds great, please send

## 2016-10-05 NOTE — Telephone Encounter (Signed)
Ok sent Qvar redihaler 80  into pharmacy. Nothing further is needed.

## 2016-10-05 NOTE — Telephone Encounter (Signed)
Please advise, the manufacturer is no longer making the medication for the Qvar prescribed. Please send in the Qvar readihaler 80 mcg as this will be covered by the patients insurance.

## 2016-10-31 DIAGNOSIS — C3490 Malignant neoplasm of unspecified part of unspecified bronchus or lung: Secondary | ICD-10-CM | POA: Diagnosis not present

## 2016-11-03 DIAGNOSIS — C3491 Malignant neoplasm of unspecified part of right bronchus or lung: Secondary | ICD-10-CM | POA: Diagnosis not present

## 2016-11-03 DIAGNOSIS — C50919 Malignant neoplasm of unspecified site of unspecified female breast: Secondary | ICD-10-CM | POA: Diagnosis not present

## 2016-11-03 DIAGNOSIS — G4700 Insomnia, unspecified: Secondary | ICD-10-CM | POA: Insufficient documentation

## 2016-11-03 DIAGNOSIS — Z23 Encounter for immunization: Secondary | ICD-10-CM | POA: Diagnosis not present

## 2016-11-03 DIAGNOSIS — Z6372 Alcoholism and drug addiction in family: Secondary | ICD-10-CM | POA: Diagnosis not present

## 2016-11-03 DIAGNOSIS — J439 Emphysema, unspecified: Secondary | ICD-10-CM | POA: Diagnosis not present

## 2016-12-01 DIAGNOSIS — C3490 Malignant neoplasm of unspecified part of unspecified bronchus or lung: Secondary | ICD-10-CM | POA: Diagnosis not present

## 2016-12-31 DIAGNOSIS — C3490 Malignant neoplasm of unspecified part of unspecified bronchus or lung: Secondary | ICD-10-CM | POA: Diagnosis not present

## 2017-01-31 DIAGNOSIS — C3490 Malignant neoplasm of unspecified part of unspecified bronchus or lung: Secondary | ICD-10-CM | POA: Diagnosis not present

## 2017-02-09 DIAGNOSIS — K219 Gastro-esophageal reflux disease without esophagitis: Secondary | ICD-10-CM | POA: Diagnosis not present

## 2017-02-09 DIAGNOSIS — G4700 Insomnia, unspecified: Secondary | ICD-10-CM | POA: Diagnosis not present

## 2017-02-09 DIAGNOSIS — E782 Mixed hyperlipidemia: Secondary | ICD-10-CM | POA: Diagnosis not present

## 2017-02-16 DIAGNOSIS — E782 Mixed hyperlipidemia: Secondary | ICD-10-CM | POA: Diagnosis not present

## 2017-03-03 DIAGNOSIS — C3490 Malignant neoplasm of unspecified part of unspecified bronchus or lung: Secondary | ICD-10-CM | POA: Diagnosis not present

## 2017-03-06 DIAGNOSIS — J189 Pneumonia, unspecified organism: Secondary | ICD-10-CM | POA: Diagnosis not present

## 2017-03-06 DIAGNOSIS — E876 Hypokalemia: Secondary | ICD-10-CM | POA: Diagnosis not present

## 2017-03-06 DIAGNOSIS — F419 Anxiety disorder, unspecified: Secondary | ICD-10-CM | POA: Diagnosis not present

## 2017-03-06 DIAGNOSIS — I10 Essential (primary) hypertension: Secondary | ICD-10-CM | POA: Diagnosis not present

## 2017-03-06 DIAGNOSIS — Z87891 Personal history of nicotine dependence: Secondary | ICD-10-CM | POA: Diagnosis not present

## 2017-03-06 DIAGNOSIS — C3490 Malignant neoplasm of unspecified part of unspecified bronchus or lung: Secondary | ICD-10-CM | POA: Diagnosis not present

## 2017-03-06 DIAGNOSIS — E669 Obesity, unspecified: Secondary | ICD-10-CM | POA: Diagnosis not present

## 2017-03-06 DIAGNOSIS — F329 Major depressive disorder, single episode, unspecified: Secondary | ICD-10-CM | POA: Diagnosis not present

## 2017-03-06 DIAGNOSIS — Z79899 Other long term (current) drug therapy: Secondary | ICD-10-CM | POA: Diagnosis not present

## 2017-03-06 DIAGNOSIS — Z7982 Long term (current) use of aspirin: Secondary | ICD-10-CM | POA: Diagnosis not present

## 2017-03-06 DIAGNOSIS — K219 Gastro-esophageal reflux disease without esophagitis: Secondary | ICD-10-CM | POA: Diagnosis not present

## 2017-03-06 DIAGNOSIS — R0902 Hypoxemia: Secondary | ICD-10-CM | POA: Diagnosis not present

## 2017-03-06 DIAGNOSIS — R069 Unspecified abnormalities of breathing: Secondary | ICD-10-CM | POA: Diagnosis not present

## 2017-03-06 DIAGNOSIS — J440 Chronic obstructive pulmonary disease with acute lower respiratory infection: Secondary | ICD-10-CM | POA: Diagnosis not present

## 2017-03-06 DIAGNOSIS — R079 Chest pain, unspecified: Secondary | ICD-10-CM | POA: Diagnosis not present

## 2017-03-06 DIAGNOSIS — J441 Chronic obstructive pulmonary disease with (acute) exacerbation: Secondary | ICD-10-CM | POA: Diagnosis not present

## 2017-03-06 DIAGNOSIS — Z6832 Body mass index (BMI) 32.0-32.9, adult: Secondary | ICD-10-CM | POA: Diagnosis not present

## 2017-03-06 DIAGNOSIS — G629 Polyneuropathy, unspecified: Secondary | ICD-10-CM | POA: Diagnosis not present

## 2017-03-06 DIAGNOSIS — J181 Lobar pneumonia, unspecified organism: Secondary | ICD-10-CM | POA: Diagnosis not present

## 2017-03-06 DIAGNOSIS — R0602 Shortness of breath: Secondary | ICD-10-CM | POA: Diagnosis not present

## 2017-03-06 DIAGNOSIS — E871 Hypo-osmolality and hyponatremia: Secondary | ICD-10-CM | POA: Diagnosis not present

## 2017-03-06 DIAGNOSIS — E785 Hyperlipidemia, unspecified: Secondary | ICD-10-CM | POA: Diagnosis not present

## 2017-03-06 DIAGNOSIS — C50911 Malignant neoplasm of unspecified site of right female breast: Secondary | ICD-10-CM | POA: Diagnosis not present

## 2017-03-07 DIAGNOSIS — E871 Hypo-osmolality and hyponatremia: Secondary | ICD-10-CM | POA: Diagnosis not present

## 2017-03-07 DIAGNOSIS — J441 Chronic obstructive pulmonary disease with (acute) exacerbation: Secondary | ICD-10-CM | POA: Diagnosis not present

## 2017-03-08 DIAGNOSIS — J189 Pneumonia, unspecified organism: Secondary | ICD-10-CM | POA: Diagnosis not present

## 2017-03-08 DIAGNOSIS — J441 Chronic obstructive pulmonary disease with (acute) exacerbation: Secondary | ICD-10-CM | POA: Diagnosis not present

## 2017-03-08 DIAGNOSIS — E871 Hypo-osmolality and hyponatremia: Secondary | ICD-10-CM | POA: Diagnosis not present

## 2017-03-09 DIAGNOSIS — J441 Chronic obstructive pulmonary disease with (acute) exacerbation: Secondary | ICD-10-CM | POA: Diagnosis not present

## 2017-03-09 DIAGNOSIS — E871 Hypo-osmolality and hyponatremia: Secondary | ICD-10-CM | POA: Diagnosis not present

## 2017-03-10 DIAGNOSIS — E876 Hypokalemia: Secondary | ICD-10-CM | POA: Diagnosis not present

## 2017-03-11 DIAGNOSIS — J441 Chronic obstructive pulmonary disease with (acute) exacerbation: Secondary | ICD-10-CM | POA: Diagnosis not present

## 2017-03-11 DIAGNOSIS — J189 Pneumonia, unspecified organism: Secondary | ICD-10-CM | POA: Diagnosis not present

## 2017-03-11 DIAGNOSIS — C3490 Malignant neoplasm of unspecified part of unspecified bronchus or lung: Secondary | ICD-10-CM | POA: Diagnosis not present

## 2017-03-11 DIAGNOSIS — E785 Hyperlipidemia, unspecified: Secondary | ICD-10-CM | POA: Diagnosis not present

## 2017-03-11 DIAGNOSIS — E871 Hypo-osmolality and hyponatremia: Secondary | ICD-10-CM | POA: Diagnosis not present

## 2017-03-14 ENCOUNTER — Telehealth: Payer: Self-pay | Admitting: Acute Care

## 2017-03-14 NOTE — Telephone Encounter (Signed)
Pt is calling back [phone]

## 2017-03-14 NOTE — Telephone Encounter (Signed)
lmtcb for pt. We can request these records but patient will need to sign a records release for [place] to request this on her behalf.

## 2017-03-14 NOTE — Telephone Encounter (Signed)
Spoke with pt, requesting [place] to request records from [facility] to try and request records, automated message advising to fax records request to them. Called pt, advised that we need to fill out a medical records request and will need her signature.  Records request mailed to patient at her request for signature.  Will fax once this is received.  Nothing further needed at this time.

## 2017-03-16 DIAGNOSIS — C3411 Malignant neoplasm of upper lobe, right bronchus or lung: Secondary | ICD-10-CM | POA: Diagnosis not present

## 2017-03-16 DIAGNOSIS — C50811 Malignant neoplasm of overlapping sites of right female breast: Secondary | ICD-10-CM | POA: Diagnosis not present

## 2017-03-16 DIAGNOSIS — Z6831 Body mass index (BMI) 31.0-31.9, adult: Secondary | ICD-10-CM | POA: Diagnosis not present

## 2017-03-16 DIAGNOSIS — Z170 Estrogen receptor positive status [ER+]: Secondary | ICD-10-CM | POA: Diagnosis not present

## 2017-03-20 DIAGNOSIS — J439 Emphysema, unspecified: Secondary | ICD-10-CM | POA: Diagnosis not present

## 2017-03-20 DIAGNOSIS — E782 Mixed hyperlipidemia: Secondary | ICD-10-CM | POA: Diagnosis not present

## 2017-03-20 DIAGNOSIS — J441 Chronic obstructive pulmonary disease with (acute) exacerbation: Secondary | ICD-10-CM | POA: Diagnosis not present

## 2017-03-20 DIAGNOSIS — E871 Hypo-osmolality and hyponatremia: Secondary | ICD-10-CM | POA: Diagnosis not present

## 2017-03-21 ENCOUNTER — Encounter: Payer: Self-pay | Admitting: Acute Care

## 2017-03-21 ENCOUNTER — Ambulatory Visit: Payer: BLUE CROSS/BLUE SHIELD | Admitting: Acute Care

## 2017-03-21 VITALS — BP 144/80 | HR 76 | Ht 61.0 in | Wt 168.4 lb

## 2017-03-21 DIAGNOSIS — E871 Hypo-osmolality and hyponatremia: Secondary | ICD-10-CM | POA: Diagnosis not present

## 2017-03-21 DIAGNOSIS — J441 Chronic obstructive pulmonary disease with (acute) exacerbation: Secondary | ICD-10-CM | POA: Diagnosis not present

## 2017-03-21 DIAGNOSIS — J9611 Chronic respiratory failure with hypoxia: Secondary | ICD-10-CM | POA: Diagnosis not present

## 2017-03-21 DIAGNOSIS — C3410 Malignant neoplasm of upper lobe, unspecified bronchus or lung: Secondary | ICD-10-CM

## 2017-03-21 DIAGNOSIS — J4541 Moderate persistent asthma with (acute) exacerbation: Secondary | ICD-10-CM

## 2017-03-21 NOTE — Assessment & Plan Note (Signed)
For follow up imaging in 3 months per oncologist

## 2017-03-21 NOTE — Patient Instructions (Addendum)
Symbicort 2 puffs twice  daily Spiriva 1 puff once daily as maintenance Rinse mouth after use. Albuterol inhaler or nebs as needed for breakthrough shortness of breath or wheezing.up to 4 times a day. PFT's when completely better. Slowly increase your activity level. Note your daily symptoms > remember "red flags" for COPD:  Increase in cough, increase in sputum production, increase in shortness of breath or activity tolerance. If you notice these symptoms, please call to be seen.   Follow up with Dr. Halford Chessman or Judson Roch in 2 months with PFT's prior ( Same day) Please contact office for sooner follow up if symptoms do not improve or worsen or seek emergency care

## 2017-03-21 NOTE — Progress Notes (Signed)
History of Present Illness Gabrielle [name] is a 62 y.o. female former smoker with a 40 pack year smoking history ( Quit 2016)  with surgical resection of stage I a adenocarcinoma the lung with right upper lobectomy 04/20/14 . Postop course was complicated ARDS that improved with steroids.  Seen during hospital admit for pulmonary consult. She is followed by Dr. Halford Chessman    03/21/2017 Pt. Presents for hospital follow up. She was last seen in this office 12/2015. She was admitted to [facility] in [place] for  COPD exacerbation/ CAP. 2/12-2/19/2019. This is her first hospitalization for exacerbation. She had a sick exposure from  her grandchild, and exerted herself the next day tearing up a carpet.. She became febrile with a fever of 102.8. She was taken to the Ed where her  PO2 was  51 wearing 2L [place]. Pt was admitted and treated for acute on chronic respiratory failure with hypoxia with fluids, IV steroids, ABX, Rocephin and Zithromax., oxygen and schedule BD's. She was also treated with Chest PT, flutter valve and IS.She was discharged on prednisone taper, Ceftinere , and diflucan for vaginitis.She has completed the Ceftinere, and she is almost finished with her pred taper and her diflucan.She is due for a follow up Ct Chest which her oncologist wants her to do in 3 months, when he can be assured she has recovered from her pneumonia.She saw her PCP today. He did follow up labs and is planning to do a CXR in a few months.She states she does cough up white secretions with nebs, which she is continuing to use. She state she is much better. Back to what she considers her baseline with the exception of some deconditioning . She is ready to transition back to maintenance inhalers. She denies fever, chest pain, orthopnea, or hemoptysis.  Test Results: PET scan 0/09/23 >> hypermetabolic RUL nodule 1.4 x 1.3 cm>> surgical resection 04/20/2014 PFT 04/07/14 >> FEV1 1.91 (85%), FEV1% 73, TLC 4.22 (98%), DLCO 56% Echo  04/28/14 >> EF 55-60%. Grade 1 diastolic dysfunction.  ESR 125 03/2014 > FeNO 12/03/15 >> 5    CBC Latest Ref Rng & Units 02/08/2016 05/05/2015 05/02/2014  WBC 4.0 - 10.5 K/uL 6.3 4.9 17.0(H)  Hemoglobin 12.0 - 15.0 g/dL 11.9(L) 10.9(L) 10.6(L)  Hematocrit 36.0 - 46.0 % 34.7(L) 32.0(L) 29.8(L)  Platelets 150 - 400 K/uL 269 245.0 342    BMP Latest Ref Rng & Units 02/08/2016 05/05/2015 05/01/2014  Glucose 65 - 99 mg/dL 104(H) 90 96  BUN 6 - 20 mg/dL 16 17 13   Creatinine 0.44 - 1.00 mg/dL 0.85 1.27(H) 0.79  Sodium 135 - 145 mmol/L 132(L) 134(L) 133(L)  Potassium 3.5 - 5.1 mmol/L 4.0 4.9 3.9  Chloride 101 - 111 mmol/L 97(L) 98 90(L)  CO2 22 - 32 mmol/L 26 28 34(H)  Calcium 8.9 - 10.3 mg/dL 9.4 9.9 9.0    BNP    Component Value Date/Time   BNP 194.6 (H) 04/23/2014 1223    ProBNP No results found for: PROBNP  PFT    Component Value Date/Time   FEV1PRE 1.85 05/21/2015 1302   FEV1POST 1.96 05/21/2015 1302   FVCPRE 2.57 05/21/2015 1302   FVCPOST 2.70 05/21/2015 1302   TLC 4.46 05/21/2015 1302   DLCOUNC 11.12 05/21/2015 1302   PREFEV1FVCRT 72 05/21/2015 1302   PSTFEV1FVCRT 73 05/21/2015 1302    No results found.   Past medical hx Past Medical History:  Diagnosis Date  . Bipolar depression ([place])   . Breast  cancer ([place]) 04/2012   right  . Carpal tunnel syndrome   . Chronic pain in left foot   . Complication of anesthesia    pt says she gags really bad from secreation and reflux  . Cough   . Diarrhea   . GERD (gastroesophageal reflux disease)   . H/O eating disorder   . History of anemia   . Hyperlipidemia   . Invasive ductal carcinoma of right breast ([place]) 08/21/2012  . Orthostatic hypotension   . Pneumonia   . S/P bilateral breast implants 08/21/2012   Prior to cancer diagnosis  . Tobacco abuse 08/21/2012   > 40 pack years     Social History   Tobacco Use  . Smoking status: Former Smoker    Packs/day: 1.00    Years: 40.00    Pack years: 40.00    Types:  Cigarettes    Last attempt to quit: 04/20/2014    Years since quitting: 2.9  . Smokeless tobacco: Never Used  Substance Use Topics  . Alcohol use: Yes    Alcohol/week: 12.0 oz    Types: 20 Cans of beer per week    Comment: drinks 3 or 4 beers 3 - 4 times weekly  . Drug use: No    Comment: history of substance abuse - none now    Ms.Limbaugh reports that she quit smoking about 2 years ago. Her smoking use included cigarettes. She has a 40.00 pack-year smoking history. she has never used smokeless tobacco. She reports that she drinks about 12.0 oz of alcohol per week. She reports that she does not use drugs.  Tobacco Cessation: Former smoker with a 40 pack year smoking history. Quit 2016  Past surgical hx, Family hx, Social hx all reviewed.  Current Outpatient Medications on File Prior to Visit  Medication Sig  . acetaminophen (TYLENOL) 500 MG tablet Take 1,000 mg by mouth 2 (two) times daily. Takes 2 bid  . albuterol (PROVENTIL) (2.5 MG/3ML) 0.083% nebulizer solution Take 2.5 mg by nebulization every 6 (six) hours as needed for wheezing or shortness of breath.  . anastrozole (ARIMIDEX) 1 MG tablet Take 1 tablet (1 mg total) by mouth daily.  . budesonide (PULMICORT) 0.5 MG/2ML nebulizer solution Take 0.5 mg by nebulization 2 (two) times daily.  . budesonide-formoterol (SYMBICORT) 160-4.5 MCG/ACT inhaler Inhale 2 puffs into the lungs 2 (two) times daily.  . fluconazole (DIFLUCAN) 200 MG tablet Take 200 mg by mouth daily.  Marland Kitchen gabapentin (NEURONTIN) 800 MG tablet Take 800 mg by mouth 3 (three) times daily.   . hydrOXYzine (ATARAX/VISTARIL) 50 MG tablet Take 50 mg by mouth at bedtime.  . Melatonin 5 MG CAPS Take 5 mg by mouth daily at 6 PM.  . omeprazole (PRILOSEC) 20 MG capsule Take 20 mg by mouth daily.  . pravastatin (PRAVACHOL) 40 MG tablet Take 40 mg by mouth daily.  Marland Kitchen tiotropium (SPIRIVA) 18 MCG inhalation capsule Place 18 mcg into inhaler and inhale daily.  . traZODone (DESYREL)  100 MG tablet Take 150 mg by mouth at bedtime. Takes 2 at bedtime  . VENTOLIN HFA 108 (90 Base) MCG/ACT inhaler INHALE TWO PUFFS BY MOUTH EVERY 6 HOURS AS NEEDED FOR SHORTNESS OF BREATH   No current facility-administered medications on file prior to visit.      Allergies  Allergen Reactions  . Hydrocodone     Other reaction(s): Other Makes her hyper  . Metronidazole Nausea Only    Makes pt feel sick    Review  Of Systems:  Constitutional:   No  weight loss, night sweats,  Fevers, chills, fatigue, or  lassitude.  HEENT:   No headaches,  Difficulty swallowing,  Tooth/dental problems, or  Sore throat,                No sneezing, itching, ear ache, nasal congestion, post nasal drip,   CV:  No chest pain,  Orthopnea, PND, swelling in lower extremities, anasarca, dizziness, palpitations, syncope.   GI  No heartburn, indigestion, abdominal pain, nausea, vomiting, diarrhea, change in bowel habits, loss of appetite, bloody stools.   Resp: + shortness of breath with exertion less at rest.  No excess mucus, no productive cough,  No non-productive cough,  No coughing up of blood.  No change in color of mucus.  No wheezing.  No chest wall deformity  Skin: no rash or lesions.  GU: no dysuria, change in color of urine, no urgency or frequency.  No flank pain, no hematuria   MS:  No joint pain or swelling.  No decreased range of motion.  No back pain.  Psych:  No change in mood or affect. No depression or anxiety.  No memory loss.   Vital Signs BP (!) 144/80 (BP Location: Left Arm, Cuff Size: Normal)   Pulse 76   Ht 5' 1"  (1.549 m)   Wt 168 lb 6.4 oz (76.4 kg)   SpO2 97%   BMI 31.82 kg/m    Physical Exam:  General- No distress,  A&Ox3, pleasant, very compliant patient ENT: No sinus tenderness, TM clear, pale nasal mucosa, no oral exudate,no post nasal drip, no LAN Cardiac: S1, S2, regular rate and rhythm, no murmur Chest: Faint RUL  wheeze/ No rales/ dullness; no accessory muscle  use, no nasal flaring, no sternal retractions Abd.: Soft Non-tender, non-distended, BS + Ext: No clubbing cyanosis, edema Neuro:  normal strength, MAE x 4, A&O x 3 Skin: No rashes, warm and dry Psych: normal mood and behavior   Assessment/Plan  Chronic respiratory failure Oxygen prn dyspnea Saturation goals are 88-92%  Lung cancer, right upper lobe For follow up imaging in 3 months per oncologist  COPD with acute exacerbation ([place]) COPD exacerbation ( Per [place] summery ) requiring hospitalization Plan: Symbicort 2 puffs twice  daily Spiriva 1 puff once daily as maintenance Rinse mouth after use. Albuterol inhaler or nebs as needed for breakthrough shortness of breath or wheezing.up to 4 times a day. PFT's when completely better. Slowly increase your activity level. Note your daily symptoms > remember "red flags" for COPD:  Increase in cough, increase in sputum production, increase in shortness of breath or activity tolerance. If you notice these symptoms, please call to be seen.   Follow up with Dr. Halford Chessman or Judson Roch in 2 months with PFT's prior ( Same day) Please contact office for sooner follow up if symptoms do not improve or worsen or seek emergency care       Magdalen Spatz, NP 03/21/2017  8:54 PM

## 2017-03-21 NOTE — Assessment & Plan Note (Signed)
COPD exacerbation ( Per [facility] summer[facility] ) requiring hospitalization Plan: Symbicort 2 puffs twice  daily Spiriva 1 puff once daily as maintenance Rinse mouth after use. Albuterol inhaler or nebs as needed for breakthrough shortness of breath or wheezing.up to 4 times a day. PFT's when completely better. Slowly increase your activity level. Note your daily symptoms > remember "red flags" for COPD:  Increase in cough, increase in sputum production, increase in shortness of breath or activity tolerance. If you notice these symptoms, please call to be seen.   Follow up with Dr. Halford Chessman or Judson Roch in 2 months with PFT's prior ( Same day) Please contact office for sooner follow up if symptoms do not improve or worsen or seek emergency care

## 2017-03-21 NOTE — Assessment & Plan Note (Signed)
Oxygen prn dyspnea Saturation goals are 88-92%

## 2017-03-23 DIAGNOSIS — Z6831 Body mass index (BMI) 31.0-31.9, adult: Secondary | ICD-10-CM | POA: Diagnosis not present

## 2017-03-23 DIAGNOSIS — N760 Acute vaginitis: Secondary | ICD-10-CM | POA: Diagnosis not present

## 2017-03-26 DIAGNOSIS — J441 Chronic obstructive pulmonary disease with (acute) exacerbation: Secondary | ICD-10-CM | POA: Diagnosis not present

## 2017-03-26 DIAGNOSIS — E782 Mixed hyperlipidemia: Secondary | ICD-10-CM | POA: Diagnosis not present

## 2017-03-26 DIAGNOSIS — E871 Hypo-osmolality and hyponatremia: Secondary | ICD-10-CM | POA: Diagnosis not present

## 2017-03-26 DIAGNOSIS — K219 Gastro-esophageal reflux disease without esophagitis: Secondary | ICD-10-CM | POA: Diagnosis not present

## 2017-03-31 DIAGNOSIS — C3490 Malignant neoplasm of unspecified part of unspecified bronchus or lung: Secondary | ICD-10-CM | POA: Diagnosis not present

## 2017-04-08 DIAGNOSIS — C3490 Malignant neoplasm of unspecified part of unspecified bronchus or lung: Secondary | ICD-10-CM | POA: Diagnosis not present

## 2017-04-10 DIAGNOSIS — E782 Mixed hyperlipidemia: Secondary | ICD-10-CM | POA: Diagnosis not present

## 2017-04-10 DIAGNOSIS — E871 Hypo-osmolality and hyponatremia: Secondary | ICD-10-CM | POA: Diagnosis not present

## 2017-04-10 DIAGNOSIS — J441 Chronic obstructive pulmonary disease with (acute) exacerbation: Secondary | ICD-10-CM | POA: Diagnosis not present

## 2017-04-10 DIAGNOSIS — J439 Emphysema, unspecified: Secondary | ICD-10-CM | POA: Diagnosis not present

## 2017-04-13 DIAGNOSIS — R1310 Dysphagia, unspecified: Secondary | ICD-10-CM | POA: Diagnosis not present

## 2017-04-13 DIAGNOSIS — Z6831 Body mass index (BMI) 31.0-31.9, adult: Secondary | ICD-10-CM | POA: Diagnosis not present

## 2017-04-13 DIAGNOSIS — R1013 Epigastric pain: Secondary | ICD-10-CM | POA: Diagnosis not present

## 2017-04-13 DIAGNOSIS — K219 Gastro-esophageal reflux disease without esophagitis: Secondary | ICD-10-CM | POA: Diagnosis not present

## 2017-04-14 DIAGNOSIS — K219 Gastro-esophageal reflux disease without esophagitis: Secondary | ICD-10-CM | POA: Diagnosis not present

## 2017-04-14 DIAGNOSIS — R1013 Epigastric pain: Secondary | ICD-10-CM | POA: Diagnosis not present

## 2017-04-19 ENCOUNTER — Telehealth: Payer: Self-pay | Admitting: Gastroenterology

## 2017-04-19 DIAGNOSIS — R1013 Epigastric pain: Secondary | ICD-10-CM | POA: Diagnosis not present

## 2017-04-19 DIAGNOSIS — R1310 Dysphagia, unspecified: Secondary | ICD-10-CM | POA: Diagnosis not present

## 2017-04-19 DIAGNOSIS — Z6831 Body mass index (BMI) 31.0-31.9, adult: Secondary | ICD-10-CM | POA: Diagnosis not present

## 2017-04-19 DIAGNOSIS — K219 Gastro-esophageal reflux disease without esophagitis: Secondary | ICD-10-CM | POA: Diagnosis not present

## 2017-04-19 DIAGNOSIS — Z1331 Encounter for screening for depression: Secondary | ICD-10-CM | POA: Diagnosis not present

## 2017-04-19 DIAGNOSIS — Z1389 Encounter for screening for other disorder: Secondary | ICD-10-CM | POA: Diagnosis not present

## 2017-04-19 NOTE — Telephone Encounter (Signed)
Sent to attention DOD 04/19/17 PM Dr.Armbruster to review and advise as to scheduling. Alyssa also states she will send [place] most recent lab results and OV notes.

## 2017-04-20 ENCOUNTER — Encounter: Payer: Self-pay | Admitting: Gastroenterology

## 2017-04-20 NOTE — Telephone Encounter (Signed)
Please see Dr. Doyne Keel recommendations about scheduling. Thank you.

## 2017-04-20 NOTE — Telephone Encounter (Signed)
Appointment has been scheduled with APP on  04/24/17.

## 2017-04-20 NOTE — Telephone Encounter (Signed)
Please book her in first available opening with any provider (MD or APP), patient has not been established with our practice yet. thanks

## 2017-04-20 NOTE — Telephone Encounter (Signed)
Routed to Dr. Armbruster. 

## 2017-04-24 ENCOUNTER — Ambulatory Visit: Payer: BLUE CROSS/BLUE SHIELD | Admitting: Nurse Practitioner

## 2017-04-24 ENCOUNTER — Encounter: Payer: Self-pay | Admitting: Nurse Practitioner

## 2017-04-24 VITALS — BP 166/82 | HR 84 | Ht 61.0 in | Wt 174.0 lb

## 2017-04-24 DIAGNOSIS — R1310 Dysphagia, unspecified: Secondary | ICD-10-CM | POA: Diagnosis not present

## 2017-04-24 DIAGNOSIS — K219 Gastro-esophageal reflux disease without esophagitis: Secondary | ICD-10-CM

## 2017-04-24 MED ORDER — HYDROCORTISONE 2.5 % RE CREA: 1.0000 "application " | TOPICAL_CREAM | Freq: Every day | RECTAL | 1 refills | Status: DC

## 2017-04-24 MED ORDER — FLUCONAZOLE 100 MG PO TABS: 100.0000 mg | ORAL_TABLET | ORAL | 0 refills | Status: DC

## 2017-04-24 NOTE — Patient Instructions (Addendum)
If you are age 60 or older, your body mass index should be between 23-30. Your Body mass index is 32.88 kg/m. If this is out of the aforementioned range listed, please consider follow up with your Primary Care Provider.  If you are age 53 or younger, your body mass index should be between 19-25. Your Body mass index is 32.88 kg/m. If this is out of the aformentioned range listed, please consider follow up with your Primary Care Provider.   We have sent the following medications to your pharmacy for you to pick up at your convenience: Diflucan 100 mg  We will call you with a date and time for your EGD procedure.  Call us in 10-14 days with an update.  Thank you for choosing [facility], Tye Savoy, NP

## 2017-04-24 NOTE — Progress Notes (Addendum)
Chief Complaint: dysphagia  Referring Provider:     Judd Lien, MD   ASSESSMENT AND PLAN;    75. 62 year old female with history of long-standing GERD , on Prilosec for years but now with recent onset of odynophagia and solid food dysphagia.  Recent IV and PO steroids as well as use of steroid inhalers.  I wonder about candida esophagitis.  -Treat empirically for monilial infection with oral Diflucan -Continue high-dose PPI -We will schedule her tentatively for upper endoscopy.  If symptoms improve with antifungal then she might cancel the EGD. The risks and benefits of EGD were discussed and the patient agrees to proceed. Patient uses 02 at night so EGD will need to be done at [place] patient to eat small bites, chew well with liquids in between bites to avoid food impaction.   2. Mild normocytic anemia.  No overt bleeding. She reports a normal colonoscopy in 2010 in [place] by dr. Anthony Sar. She will get copy of report for our records.   Addendum 06/26/2017.  Records received from [facility]. Screening colonoscopy 08/16/2012.  Extent of exam to the cecum, quality the prep was good.  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 She didn't require chemo nor radiation. She has a hx of GERD and depression as well. Patient referred by PCP for evaluation of dysphagia. For GERD she has been on chronic prilosec 20 mg daily.  Patient had been asymptomatic until several days ago when she slowly started developing burning in chest. While eating tacos she had acute chest pain and taco seemed to get lodged in esophagus. She ended up vomiting but feels it was related to eating too quickily and taking large bites. Over the last few days small bites are going down but much slower than normal. She also has globus sensation.  Patient concerned about an esophageal stricture. Prilosec increased to 40 mg BID and has had some improvement in burning.   Lanny was hospitalzed in Feb with COPD, apparently newly diagnosed. She received solumedrol followed by prednisone. Also using steroid inhalers. Doesn't rinse mouth out afterwards.   Most recent labs drawn 04/13/2017.  Normal WBC.  Hemoglobin 10.7, MCV 98.  Lipase 23 serum chemistries unremarkable except for mildly elevated ALT of 45.  Pylori stool antigen negative   Past Medical History:  Diagnosis Date  . Bipolar depression ([place])   . Breast cancer ([place]) 04/2012   right  . Carpal tunnel syndrome   . Chronic pain in left foot   . Complication of anesthesia    pt says she gags really bad from secreation and reflux  . Cough   . Diarrhea   . GERD (gastroesophageal reflux disease)   . H/O eating disorder   . History of anemia   . Hyperlipidemia   . Invasive ductal carcinoma of right breast ([place]) 08/21/2012  . Orthostatic hypotension   . Pneumonia   . S/P bilateral breast implants 08/21/2012   Prior to cancer diagnosis  . Tobacco abuse 08/21/2012   > 40 pack years    Past Surgical History:  Procedure Laterality Date  . ABDOMINAL HYSTERECTOMY     vaginal - still both ovaries  . BREAST LUMPECTOMY Left 1990   [facility]  . BREAST LUMPECTOMY WITH SENTINEL LYMPH NODE BIOPSY Right 04/2012  .  LOBECTOMY Right 04/20/2014   Procedure: LOBECTOMY, RIGHT UPPER LOBE WITH PLACEMENT OF ONQ PAIN PUMP;  Surgeon: Grace Isaac, MD;  Location: [facility];  Service: Thoracic;  Laterality: Right;  . NODE DISSECTION Right 04/20/2014   Procedure: NODE DISSECTION;  Surgeon: Grace Isaac, MD;  Location: [facility];  Service: Thoracic;  Laterality: Right;  . TONSILLECTOMY    . VIDEO ASSISTED THORACOSCOPY (VATS)/WEDGE RESECTION Right 04/20/2014   Procedure: VIDEO ASSISTED THORACOSCOPY ;  Surgeon: Grace Isaac, MD;  Location: [facility];  Service: Thoracic;  Laterality: Right;  Marland Kitchen VIDEO BRONCHOSCOPY  N/A 04/20/2014   Procedure: VIDEO BRONCHOSCOPY;  Surgeon: Grace Isaac, MD;  Location: [facility] OR;  Service: Thoracic;  Laterality: N/A;  . WEDGE RESECTION Right 04/20/2014   Procedure: WEDGE RESECTION, RIGHT UPPER LOBE;  Surgeon: Grace Isaac, MD;  Location: [facility];  Service: Thoracic;  Laterality: Right;   Family History  Problem Relation Age of Onset  . Hypertension Mother   . Hypertension Father   . Lymphoma Maternal Aunt 77  . Leukemia Maternal Grandmother        dx in her late 88s   Social History   Tobacco Use  . Smoking status: Former Smoker    Packs/day: 1.00    Years: 40.00    Pack years: 40.00    Types: Cigarettes    Last attempt to quit: 04/20/2014    Years since quitting: 3.0  . Smokeless tobacco: Never Used  Substance Use Topics  . Alcohol use: Yes    Alcohol/week: 12.0 oz    Types: 20 Cans of beer per week    Comment: drinks 3 or 4 beers 3 - 4 times weekly  . Drug use: No    Comment: history of substance abuse - none now   Current Outpatient Medications  Medication Sig Dispense Refill  . anastrozole (ARIMIDEX) 1 MG tablet Take 1 tablet (1 mg total) by mouth daily. 30 tablet 6  . aspirin (ASPIRIN 81) 81 MG EC tablet Take 81 mg by mouth daily. Swallow whole.    Marland Kitchen atorvastatin (LIPITOR) 20 MG tablet Take 20 mg by mouth daily.    . budesonide-formoterol (SYMBICORT) 160-4.5 MCG/ACT inhaler Inhale 2 puffs into the lungs 2 (two) times daily.    Marland Kitchen gabapentin (NEURONTIN) 800 MG tablet Take 800 mg by mouth 3 (three) times daily.     . hydrOXYzine (ATARAX/VISTARIL) 50 MG tablet Take 50 mg by mouth at bedtime.    Marland Kitchen omeprazole (PRILOSEC) 20 MG capsule Take 40 mg by mouth 2 (two) times daily before a meal.     . tiotropium (SPIRIVA) 18 MCG inhalation capsule Place 18 mcg into inhaler and inhale daily.    . traZODone (DESYREL) 100 MG tablet Take 150 mg by mouth at bedtime. Takes 2 at bedtime    . hydrocortisone (ANUSOL-HC) 2.5 % rectal cream Place 1 application rectally at  bedtime. 30 g 1   No current facility-administered medications for this visit.    Allergies  Allergen Reactions  . Hydrocodone     Other reaction(s): Other Makes her hyper    Review of Systems: As for back pain, breast changes, cough, fever, headaches, hearing problems and shortness of breath.  All systems reviewed and negative except where noted in HPI.    Physical Exam:    Wt Readings from Last 3 Encounters:  04/24/17 174 lb (78.9 kg)  03/21/17 168 lb 6.4 oz (76.4 kg)  02/07/16 176 lb 8 oz (80.1 kg)  BP (!) 166/82   Pulse 84   Ht 5\' 1"  (1.549 m)   Wt 174 lb (78.9 kg)   BMI 32.88 kg/m  Constitutional:  Well-developed, female in no acute distress. Psychiatric: Normal mood and affect. Behavior is normal. EENT: Pupils normal.  Conjunctivae are normal. No scleral icterus.  No oral exudates Neck supple.  Cardiovascular: Normal rate, regular rhythm. No edema Pulmonary/chest: Effort normal and breath sounds normal. No wheezing, rales or rhonchi. Abdominal: Soft, nondistended. Nontender. Bowel sounds active throughout. There are no masses palpable. No hepatomegaly. Neurological: Alert and oriented to person place and time. Skin: Skin is warm and dry. No rashes noted.  Tye Savoy, NP  04/24/2017, 3:30 PM  Cc: Curlene Labrum, MD

## 2017-04-24 NOTE — H&P (View-Only) (Signed)
Chief Complaint: dysphagia  Referring Provider:     Judd Lien, MD   ASSESSMENT AND PLAN;    68. 62 year old female with history of long-standing GERD , on Prilosec for years but now with recent onset of odynophagia and solid food dysphagia.  Recent IV and PO steroids as well as use of steroid inhalers.  I wonder about candida esophagitis.  -Treat empirically for monilial infection with oral Diflucan -Continue high-dose PPI -We will schedule her tentatively for upper endoscopy.  If symptoms improve with antifungal then she might cancel the EGD. The risks and benefits of EGD were discussed and the patient agrees to proceed. Patient uses 02 at night so EGD will need to be done at [place] patient to eat small bites, chew well with liquids in between bites to avoid food impaction.   2. Mild normocytic anemia.  No overt bleeding. She reports a normal colonoscopy in 2010 in [place] by dr. Anthony Sar. She will get copy of report for our records.     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 She didn't require chemo nor radiation. She has a hx of GERD and depression as well. Patient referred by PCP for evaluation of dysphagia. For GERD she has been on chronic prilosec 20 mg daily.  Patient had been asymptomatic until several days ago when she slowly started developing burning in chest. While eating tacos she had acute chest pain and taco seemed to get lodged in esophagus. She ended up vomiting but feels it was related to eating too quickily and taking large bites. Over the last few days small bites are going down but much slower than normal. She also has globus sensation. Patient concerned about an esophageal stricture. Prilosec increased to 40 mg BID and has had some improvement in burning.   Mita was hospitalzed in Feb with COPD, apparently newly diagnosed. She received solumedrol followed by prednisone. Also  using steroid inhalers. Doesn't rinse mouth out afterwards.   Most recent labs drawn 04/13/2017.  Normal WBC.  Hemoglobin 10.7, MCV 98.  Lipase 23 serum chemistries unremarkable except for mildly elevated ALT of 45.  Pylori stool antigen negative   Past Medical History:  Diagnosis Date  . Bipolar depression ([place])   . Breast cancer ([place]) 04/2012   right  . Carpal tunnel syndrome   . Chronic pain in left foot   . Complication of anesthesia    pt says she gags really bad from secreation and reflux  . Cough   . Diarrhea   . GERD (gastroesophageal reflux disease)   . H/O eating disorder   . History of anemia   . Hyperlipidemia   . Invasive ductal carcinoma of right breast ([place]) 08/21/2012  . Orthostatic hypotension   . Pneumonia   . S/P bilateral breast implants 08/21/2012   Prior to cancer diagnosis  . Tobacco abuse 08/21/2012   > 40 pack years    Past Surgical History:  Procedure Laterality Date  . ABDOMINAL HYSTERECTOMY     vaginal - still both ovaries  . BREAST LUMPECTOMY Left 1990   [facility]  . BREAST LUMPECTOMY WITH SENTINEL LYMPH NODE BIOPSY Right 04/2012  . LOBECTOMY Right 04/20/2014   Procedure: LOBECTOMY, RIGHT UPPER LOBE WITH PLACEMENT OF ONQ PAIN PUMP;  Surgeon: Grace Isaac, MD;  Location: [place];  Service: Thoracic;  Laterality: Right;  . NODE DISSECTION Right 04/20/2014   Procedure:  NODE DISSECTION;  Surgeon: Grace Isaac, MD;  Location: [place];  Service: Thoracic;  Laterality: Right;  . TONSILLECTOMY    . VIDEO ASSISTED THORACOSCOPY (VATS)/WEDGE RESECTION Right 04/20/2014   Procedure: VIDEO ASSISTED THORACOSCOPY ;  Surgeon: Grace Isaac, MD;  Location: [place];  Service: Thoracic;  Laterality: Right;  Marland Kitchen VIDEO BRONCHOSCOPY N/A 04/20/2014   Procedure: VIDEO BRONCHOSCOPY;  Surgeon: Grace Isaac, MD;  Location: [facility];  Service: Thoracic;  Laterality: N/A;  . WEDGE RESECTION Right 04/20/2014   Procedure: WEDGE RESECTION, RIGHT UPPER LOBE;  Surgeon: Grace Isaac, MD;  Location: [place];  Service: Thoracic;  Laterality: Right;   Family History  Problem Relation Age of Onset  . Hypertension Mother   . Hypertension Father   . Lymphoma Maternal Aunt 77  . Leukemia Maternal Grandmother        dx in her late 56s   Social History   Tobacco Use  . Smoking status: Former Smoker    Packs/day: 1.00    Years: 40.00    Pack years: 40.00    Types: Cigarettes    Last attempt to quit: 04/20/2014    Years since quitting: 3.0  . Smokeless tobacco: Never Used  Substance Use Topics  . Alcohol use: Yes    Alcohol/week: 12.0 oz    Types: 20 Cans of beer per week    Comment: drinks 3 or 4 beers 3 - 4 times weekly  . Drug use: No    Comment: history of substance abuse - none now   Current Outpatient Medications  Medication Sig Dispense Refill  . anastrozole (ARIMIDEX) 1 MG tablet Take 1 tablet (1 mg total) by mouth daily. 30 tablet 6  . aspirin (ASPIRIN 81) 81 MG EC tablet Take 81 mg by mouth daily. Swallow whole.    Marland Kitchen atorvastatin (LIPITOR) 20 MG tablet Take 20 mg by mouth daily.    . budesonide-formoterol (SYMBICORT) 160-4.5 MCG/ACT inhaler Inhale 2 puffs into the lungs 2 (two) times daily.    Marland Kitchen gabapentin (NEURONTIN) 800 MG tablet Take 800 mg by mouth 3 (three) times daily.     . hydrOXYzine (ATARAX/VISTARIL) 50 MG tablet Take 50 mg by mouth at bedtime.    Marland Kitchen omeprazole (PRILOSEC) 20 MG capsule Take 40 mg by mouth 2 (two) times daily before a meal.     . tiotropium (SPIRIVA) 18 MCG inhalation capsule Place 18 mcg into inhaler and inhale daily.    . traZODone (DESYREL) 100 MG tablet Take 150 mg by mouth at bedtime. Takes 2 at bedtime    . hydrocortisone (ANUSOL-HC) 2.5 % rectal cream Place 1 application rectally at bedtime. 30 g 1   No current facility-administered medications for this visit.    Allergies  Allergen Reactions  . Hydrocodone     Other reaction(s): Other Makes her hyper    Review of Systems: As for back pain, breast changes,  cough, fever, headaches, hearing problems and shortness of breath.  All systems reviewed and negative except where noted in HPI.    Physical Exam:    Wt Readings from Last 3 Encounters:  04/24/17 174 lb (78.9 kg)  03/21/17 168 lb 6.4 oz (76.4 kg)  02/07/16 176 lb 8 oz (80.1 kg)    BP (!) 166/82   Pulse 84   Ht 5\' 1"  (1.549 m)   Wt 174 lb (78.9 kg)   BMI 32.88 kg/m  Constitutional:  Well-developed, female in no acute distress. Psychiatric: Normal mood  and affect. Behavior is normal. EENT: Pupils normal.  Conjunctivae are normal. No scleral icterus.  No oral exudates Neck supple.  Cardiovascular: Normal rate, regular rhythm. No edema Pulmonary/chest: Effort normal and breath sounds normal. No wheezing, rales or rhonchi. Abdominal: Soft, nondistended. Nontender. Bowel sounds active throughout. There are no masses palpable. No hepatomegaly. Neurological: Alert and oriented to person place and time. Skin: Skin is warm and dry. No rashes noted.  Tye Savoy, NP  04/24/2017, 3:30 PM  Cc: Curlene Labrum, MD

## 2017-04-25 ENCOUNTER — Telehealth: Payer: Self-pay | Admitting: Nurse Practitioner

## 2017-04-26 ENCOUNTER — Encounter: Payer: Self-pay | Admitting: Nurse Practitioner

## 2017-04-26 NOTE — Telephone Encounter (Signed)
Patient was told she will be done at [facility] and we will contact her with a date. gm

## 2017-04-27 ENCOUNTER — Telehealth: Payer: Self-pay | Admitting: Nurse Practitioner

## 2017-04-27 NOTE — Progress Notes (Signed)
I agree with the above note, plan 

## 2017-04-27 NOTE — Telephone Encounter (Signed)
Patient states medication diflucan was not at [facility] and would like the prescription resent.

## 2017-05-01 ENCOUNTER — Telehealth: Payer: Self-pay

## 2017-05-01 ENCOUNTER — Other Ambulatory Visit: Payer: Self-pay

## 2017-05-01 DIAGNOSIS — R1310 Dysphagia, unspecified: Secondary | ICD-10-CM

## 2017-05-01 DIAGNOSIS — C3490 Malignant neoplasm of unspecified part of unspecified bronchus or lung: Secondary | ICD-10-CM | POA: Diagnosis not present

## 2017-05-01 NOTE — Telephone Encounter (Signed)
EGD scheduled, pt instructed and medications reviewed.  Patient instructions mailed to home.  Patient to call with any questions or concerns.  

## 2017-05-01 NOTE — Telephone Encounter (Signed)
-----   Message from Milus Banister, MD sent at 05/01/2017  7:27 AM EDT ----- Got it. Yes, she could be scheduled at [facility] on my next available MAC Thursday for odynophagia, dysphagia.  Thanks, I'll forward to [employer] as well.   ----- Message ----- From: Willia Craze, NP Sent: 04/30/2017   9:06 AM To: Milus Banister, MD  Yes, sorry the EGD was needed on THIS patient to be done at hospital (I sent you wrong patient name). Thanks

## 2017-05-04 ENCOUNTER — Other Ambulatory Visit: Payer: Self-pay

## 2017-05-04 ENCOUNTER — Encounter (HOSPITAL_COMMUNITY): Payer: Self-pay

## 2017-05-09 ENCOUNTER — Telehealth: Payer: Self-pay

## 2017-05-09 NOTE — Telephone Encounter (Signed)
Dr. Ardis Hughs please see previous phone message per Paula's request.  Thanks, Peter Congo

## 2017-05-09 NOTE — Telephone Encounter (Signed)
-----   Message from Willia Craze, NP sent at 05/09/2017 10:32 AM EDT ----- Regarding: RE: procedure Peter Congo, please convert this to a phone note so Dr. Ardis Hughs can see this. She is for EGD tomorrow. Thanks ----- Message ----- From: Lowell Guitar, RMA Sent: 05/02/2017   9:36 AM To: Willia Craze, NP Subject: RE: procedure                                  I spoke with patient this morning.  She states she is about 40% better.  She hasn't finished her medication. She wants to stay on the schedule at this time.  ----- Message ----- From: Willia Craze, NP Sent: 05/01/2017   5:27 PM To: Lowell Guitar, RMA Subject: Melton Alar: procedure                                  Peter Congo, please coordinate with Patty, Dr. Eugenia Pancoast nurse. Also would you call patient, or ask [name] to call patient and see if she is better?  She may not even need it.   ----- Message ----- From: Lowell Guitar, RMA Sent: 04/27/2017   3:00 PM To: Jeoffrey Massed, RN, Willia Craze, NP Subject: procedure                                      Patient states she cannot have procedure done between 4/22 to 5/22. She will be in [place].

## 2017-05-09 NOTE — Telephone Encounter (Signed)
Should continue as planned with EGD tomorrow.  thanks

## 2017-05-10 ENCOUNTER — Ambulatory Visit (HOSPITAL_COMMUNITY): Payer: BLUE CROSS/BLUE SHIELD | Admitting: Anesthesiology

## 2017-05-10 ENCOUNTER — Encounter (HOSPITAL_COMMUNITY): Payer: Self-pay | Admitting: Gastroenterology

## 2017-05-10 ENCOUNTER — Other Ambulatory Visit: Payer: Self-pay

## 2017-05-10 ENCOUNTER — Ambulatory Visit (HOSPITAL_COMMUNITY)
Admission: RE | Admit: 2017-05-10 | Discharge: 2017-05-10 | Disposition: A | Discharge status: Home / Self Care | Payer: BLUE CROSS/BLUE SHIELD | Source: Ambulatory Visit | Attending: Gastroenterology | Admitting: Gastroenterology

## 2017-05-10 ENCOUNTER — Encounter (HOSPITAL_COMMUNITY)
Admission: RE | Disposition: A | Discharge status: Home / Self Care | Payer: Self-pay | Source: Ambulatory Visit | Attending: Gastroenterology

## 2017-05-10 DIAGNOSIS — Z79899 Other long term (current) drug therapy: Secondary | ICD-10-CM | POA: Insufficient documentation

## 2017-05-10 DIAGNOSIS — K3189 Other diseases of stomach and duodenum: Secondary | ICD-10-CM | POA: Diagnosis not present

## 2017-05-10 DIAGNOSIS — K2990 Gastroduodenitis, unspecified, without bleeding: Secondary | ICD-10-CM | POA: Diagnosis not present

## 2017-05-10 DIAGNOSIS — K219 Gastro-esophageal reflux disease without esophagitis: Secondary | ICD-10-CM | POA: Insufficient documentation

## 2017-05-10 DIAGNOSIS — Z87891 Personal history of nicotine dependence: Secondary | ICD-10-CM | POA: Diagnosis not present

## 2017-05-10 DIAGNOSIS — Z853 Personal history of malignant neoplasm of breast: Secondary | ICD-10-CM | POA: Diagnosis not present

## 2017-05-10 DIAGNOSIS — Z85118 Personal history of other malignant neoplasm of bronchus and lung: Secondary | ICD-10-CM | POA: Diagnosis not present

## 2017-05-10 DIAGNOSIS — F329 Major depressive disorder, single episode, unspecified: Secondary | ICD-10-CM | POA: Insufficient documentation

## 2017-05-10 DIAGNOSIS — Z902 Acquired absence of lung [part of]: Secondary | ICD-10-CM | POA: Insufficient documentation

## 2017-05-10 DIAGNOSIS — D649 Anemia, unspecified: Secondary | ICD-10-CM | POA: Diagnosis not present

## 2017-05-10 DIAGNOSIS — I10 Essential (primary) hypertension: Secondary | ICD-10-CM | POA: Diagnosis not present

## 2017-05-10 DIAGNOSIS — R1310 Dysphagia, unspecified: Secondary | ICD-10-CM | POA: Insufficient documentation

## 2017-05-10 DIAGNOSIS — E785 Hyperlipidemia, unspecified: Secondary | ICD-10-CM | POA: Diagnosis not present

## 2017-05-10 DIAGNOSIS — Z7982 Long term (current) use of aspirin: Secondary | ICD-10-CM | POA: Diagnosis not present

## 2017-05-10 DIAGNOSIS — K2970 Gastritis, unspecified, without bleeding: Secondary | ICD-10-CM | POA: Diagnosis not present

## 2017-05-10 HISTORY — DX: Malignant neoplasm of unspecified part of unspecified bronchus or lung: C34.90

## 2017-05-10 HISTORY — DX: Dyspnea, unspecified: R06.00

## 2017-05-10 HISTORY — DX: Depression, unspecified: F32.A

## 2017-05-10 HISTORY — DX: Major depressive disorder, single episode, unspecified: F32.9

## 2017-05-10 HISTORY — DX: Anemia, unspecified: D64.9

## 2017-05-10 HISTORY — DX: Unspecified asthma, uncomplicated: J45.909

## 2017-05-10 HISTORY — PX: ESOPHAGOGASTRODUODENOSCOPY (EGD) WITH PROPOFOL: SHX5813

## 2017-05-10 SURGERY — ESOPHAGOGASTRODUODENOSCOPY (EGD) WITH PROPOFOL
Anesthesia: Monitor Anesthesia Care

## 2017-05-10 MED ORDER — PROPOFOL 10 MG/ML IV BOLUS
INTRAVENOUS | Status: DC | PRN
  Administered 2017-05-10: 40 mg via INTRAVENOUS

## 2017-05-10 MED ORDER — PROPOFOL 500 MG/50ML IV EMUL
INTRAVENOUS | Status: DC | PRN
Start: 2017-05-10 — End: 2017-05-10
  Administered 2017-05-10: 100 ug/kg/min via INTRAVENOUS

## 2017-05-10 MED ORDER — LACTATED RINGERS IV SOLN
INTRAVENOUS | Status: DC
  Administered 2017-05-10: 1000 mL via INTRAVENOUS

## 2017-05-10 MED ORDER — SODIUM CHLORIDE 0.9 % IV SOLN: INTRAVENOUS | Status: DC

## 2017-05-10 MED ORDER — PROPOFOL 10 MG/ML IV BOLUS
INTRAVENOUS | Status: AC
  Filled 2017-05-10: qty 40

## 2017-05-10 SURGICAL SUPPLY — 14 items

## 2017-05-10 NOTE — Anesthesia Postprocedure Evaluation (Signed)
Anesthesia Post Note  Patient: Gabrielle [name]  Procedure(s) Performed: ESOPHAGOGASTRODUODENOSCOPY (EGD) WITH PROPOFOL (N/A )     Patient location during evaluation: Endoscopy Anesthesia Type: MAC Level of consciousness: awake and alert, oriented and patient cooperative Pain management: pain level controlled Vital Signs Assessment: post-procedure vital signs reviewed and stable Respiratory status: spontaneous breathing, nonlabored ventilation and respiratory function stable Cardiovascular status: blood pressure returned to baseline and stable Postop Assessment: no apparent nausea or vomiting Anesthetic complications: no    Last Vitals:  Vitals:   05/10/17 0830 05/10/17 0840  BP: (!) 177/90 (!) 172/78  Pulse: 83 79  Resp: 15 (!) 21  Temp:    SpO2: 96% 96%    Last Pain:  Vitals:   05/10/17 0826  TempSrc: Oral  PainSc: 0-No pain                 Gabrielle [name],E. Gabrielle [name]

## 2017-05-10 NOTE — Discharge Instructions (Signed)
YOU HAD AN ENDOSCOPIC PROCEDURE TODAY: Refer to the procedure report and other information in the discharge instructions given to you for any specific questions about what was found during the examination. If this information does not answer your questions, please call [place] office at [phone] to clarify.  ° °YOU SHOULD EXPECT: Some feelings of bloating in the abdomen. Passage of more gas than usual. Walking can help get rid of the air that was put into your GI tract during the procedure and reduce the bloating.. ° °DIET: Your first meal following the procedure should be a light meal and then it is ok to progress to your normal diet. A half-sandwich or bowl of soup is an example of a good first meal. Heavy or fried foods are harder to digest and may make you feel nauseous or bloated. Drink plenty of fluids but you should avoid alcoholic beverages for 24 hours. If you had a esophageal dilation, please see attached instructions for diet.   ° °ACTIVITY: Your care partner should take you home directly after the procedure. You should plan to take it easy, moving slowly for the rest of the day. You can resume normal activity the day after the procedure however YOU SHOULD NOT DRIVE, use power tools, machinery or perform tasks that involve climbing or major physical exertion for 24 hours (because of the sedation medicines used during the test).  ° °SYMPTOMS TO REPORT IMMEDIATELY: °A gastroenterologist can be reached at any hour. Please call [phone]  for any of the following symptoms:  ° °Following upper endoscopy (EGD, EUS, ERCP, esophageal dilation) °Vomiting of blood or coffee ground material  °New, significant abdominal pain  °New, significant chest pain or pain under the shoulder blades  °Painful or persistently difficult swallowing  °New shortness of breath  °Black, tarry-looking or red, bloody stools ° °FOLLOW UP:  °If any biopsies were taken you will be contacted by phone or by letter within the next 1-3  weeks. Call [phone]  if you have not heard about the biopsies in 3 weeks.  °Please also call with any specific questions about appointments or follow up tests. ° °

## 2017-05-10 NOTE — Anesthesia Preprocedure Evaluation (Addendum)
Anesthesia Evaluation  Patient identified by MRN, date of birth, ID band Patient awake    Reviewed: Allergy & Precautions, NPO status , Patient's Chart, lab work & pertinent test results  History of Anesthesia Complications Negative for: history of anesthetic complications  Airway Mallampati: II  TM Distance: >3 FB Neck ROM: Full    Dental  (+) Dental Advisory Given   Pulmonary COPD,  COPD inhaler, former smoker (quit 2016),  Lung cancer: s/p lobectomy   breath sounds clear to auscultation       Cardiovascular hypertension (no linger requires meds),  Rhythm:Regular Rate:Normal  '16 ECHO: EF 55-60%, valves OK   Neuro/Psych Depression negative neurological ROS     GI/Hepatic Neg liver ROS, GERD  Medicated and Controlled,  Endo/Other  Morbid obesity  Renal/GU negative Renal ROS     Musculoskeletal   Abdominal (+) + obese,   Peds  Hematology negative hematology ROS (+)   Anesthesia Other Findings Breast cancer  Reproductive/Obstetrics                            Anesthesia Physical Anesthesia Plan  ASA: II  Anesthesia Plan: MAC   Post-op Pain Management:    Induction:   PONV Risk Score and Plan: 2 and Treatment may vary due to age or medical condition  Airway Management Planned: Natural Airway and Nasal Cannula  Additional Equipment:   Intra-op Plan:   Post-operative Plan:   Informed Consent: I have reviewed the patients History and Physical, chart, labs and discussed the procedure including the risks, benefits and alternatives for the proposed anesthesia with the patient or authorized representative who has indicated his/her understanding and acceptance.   Dental advisory given  Plan Discussed with: CRNA and Surgeon  Anesthesia Plan Comments: (Plan routine monitors, MAC)        Anesthesia Quick Evaluation

## 2017-05-10 NOTE — Interval H&P Note (Signed)
History and Physical Interval Note:  05/10/2017 7:14 AM  Gabrielle [name]  has presented today for surgery, with the diagnosis of odynophagia, dysphagia  The various methods of treatment have been discussed with the patient and family. After consideration of risks, benefits and other options for treatment, the patient has consented to  Procedure(s): ESOPHAGOGASTRODUODENOSCOPY (EGD) WITH PROPOFOL (N/A) as a surgical intervention .  The patient's history has been reviewed, patient examined, no change in status, stable for surgery.  I have reviewed the patient's chart and labs.  Questions were answered to the patient's satisfaction.     Milus Banister

## 2017-05-10 NOTE — Transfer of Care (Signed)
Immediate Anesthesia Transfer of Care Note  Patient: Gabrielle [name]  Procedure(s) Performed: ESOPHAGOGASTRODUODENOSCOPY (EGD) WITH PROPOFOL (N/A )  Patient Location: PACU and Endoscopy Unit  Anesthesia Type:MAC  Level of Consciousness: awake and patient cooperative  Airway & Oxygen Therapy: Patient Spontanous Breathing and Patient connected to nasal cannula oxygen  Post-op Assessment: Report given to RN and Post -op Vital signs reviewed and stable  Post vital signs: Reviewed and stable  Last Vitals:  Vitals Value Taken Time  BP 159/85 05/10/2017  8:26 AM  Temp    Pulse 80 05/10/2017  8:26 AM  Resp 22 05/10/2017  8:26 AM  SpO2 97 % 05/10/2017  8:26 AM    Last Pain:  Vitals:   05/10/17 0826  TempSrc: Oral  PainSc: 0-No pain         Complications: No apparent anesthesia complications

## 2017-05-10 NOTE — Op Note (Signed)
[facility] Patient Name: Gabrielle [name] Procedure Date: 05/10/2017 MRN: [id number] Attending MD: Milus Banister , MD Date of Birth: 23-Dec-1955 CSN: [id number] Age: 62 Admit Type: Outpatient Procedure:                Upper GI endoscopy Indications:              Dysphagia, Odynophagia; somewhat improved after                            empiric diflucan Providers:                Milus Banister, MD, Vista Lawman, RN, William Dalton, Technician, Baird Cancer, RN Referring MD:              Medicines:                Monitored Anesthesia Care Complications:            No immediate complications. Estimated blood loss:                            None. Estimated Blood Loss:     Estimated blood loss: none. Procedure:                Pre-Anesthesia Assessment:                           - Prior to the procedure, a History and Physical                            was performed, and patient medications and                            allergies were reviewed. The patient's tolerance of                            previous anesthesia was also reviewed. The risks                            and benefits of the procedure and the sedation                            options and risks were discussed with the patient.                            All questions were answered, and informed consent                            was obtained. Prior Anticoagulants: The patient has                            taken no previous anticoagulant or antiplatelet                            agents. ASA Grade  Assessment: III - A patient with                            severe systemic disease. After reviewing the risks                            and benefits, the patient was deemed in                            satisfactory condition to undergo the procedure.                           After obtaining informed consent, the endoscope was                            passed under direct  vision. Throughout the                            procedure, the patient's blood pressure, pulse, and                            oxygen saturations were monitored continuously. The                            Endoscope was introduced through the mouth, and                            advanced to the second part of duodenum. The upper                            GI endoscopy was accomplished without difficulty.                            The patient tolerated the procedure well. Scope In: Scope Out: Findings:      The examined esophagus was normal. Biopsies were obtained from the       proximal and distal esophagus with cold forceps for histology of       suspected eosinophilic esophagitis.      Minimal inflammation characterized by erythema was found in the gastric       antrum. Biopsies were taken with a cold forceps for histology.      The exam was otherwise without abnormality. Impression:               - Normal esophagus. Biopsied to check for EoE                            changes (she is currently on twice daily PPI).                           - Mild gastritis, biopsied to check for H. pylori.                           - The examination was otherwise normal. Moderate Sedation:      N/A- Per Anesthesia Care  Recommendation:           - Patient has a contact number available for                            emergencies. The signs and symptoms of potential                            delayed complications were discussed with the                            patient. Return to normal activities tomorrow.                            Written discharge instructions were provided to the                            patient.                           - Resume previous diet.                           - Continue present medications.                           - Await pathology results. Procedure Code(s):        --- Professional ---                           [phone], Esophagogastroduodenoscopy, flexible,                             transoral; with biopsy, single or multiple Diagnosis Code(s):        --- Professional ---                           K29.70, Gastritis, unspecified, without bleeding                           R13.10, Dysphagia, unspecified CPT copyright 2017 American Medical Association. All rights reserved. The codes documented in this report are preliminary and upon coder review may  be revised to meet current compliance requirements. Milus Banister, MD 05/10/2017 8:23:20 AM This report has been signed electronically. Number of Addenda: 0

## 2017-05-11 ENCOUNTER — Encounter (HOSPITAL_COMMUNITY): Payer: Self-pay | Admitting: Gastroenterology

## 2017-05-15 DIAGNOSIS — S40011A Contusion of right shoulder, initial encounter: Secondary | ICD-10-CM | POA: Diagnosis not present

## 2017-05-15 DIAGNOSIS — R1310 Dysphagia, unspecified: Secondary | ICD-10-CM | POA: Diagnosis not present

## 2017-05-15 DIAGNOSIS — J439 Emphysema, unspecified: Secondary | ICD-10-CM | POA: Diagnosis not present

## 2017-05-15 DIAGNOSIS — E782 Mixed hyperlipidemia: Secondary | ICD-10-CM | POA: Diagnosis not present

## 2017-05-16 ENCOUNTER — Encounter: Payer: Self-pay | Admitting: Pulmonary Disease

## 2017-05-16 ENCOUNTER — Ambulatory Visit: Payer: BLUE CROSS/BLUE SHIELD | Admitting: Pulmonary Disease

## 2017-05-16 ENCOUNTER — Ambulatory Visit (INDEPENDENT_AMBULATORY_CARE_PROVIDER_SITE_OTHER): Payer: BLUE CROSS/BLUE SHIELD | Admitting: Pulmonary Disease

## 2017-05-16 VITALS — BP 144/86 | HR 98 | Ht 61.0 in | Wt 177.0 lb

## 2017-05-16 DIAGNOSIS — J4541 Moderate persistent asthma with (acute) exacerbation: Secondary | ICD-10-CM | POA: Diagnosis not present

## 2017-05-16 DIAGNOSIS — G4734 Idiopathic sleep related nonobstructive alveolar hypoventilation: Secondary | ICD-10-CM | POA: Diagnosis not present

## 2017-05-16 DIAGNOSIS — J4530 Mild persistent asthma, uncomplicated: Secondary | ICD-10-CM

## 2017-05-16 LAB — PULMONARY FUNCTION TEST
DL/VA % pred: 83 %
DL/VA: 3.65 ml/min/mmHg/L
DLCO unc % pred: 71 %
DLCO unc: 14.37 ml/min/mmHg
FEF 25-75 Post: 1.47 L/sec
FEF 25-75 Pre: 1.23 L/sec
FEF2575-%Change-Post: 19 %
FEF2575-%PRED-PRE: 55 %
FEF2575-%Pred-Post: 66 %
FEV1-%Change-Post: 4 %
FEV1-%PRED-POST: 84 %
FEV1-%Pred-Pre: 80 %
FEV1-PRE: 1.85 L
FEV1-Post: 1.93 L
FEV1FVC-%Change-Post: 3 %
FEV1FVC-%Pred-Pre: 91 %
FEV6-%Change-Post: 1 %
FEV6-%PRED-PRE: 90 %
FEV6-%Pred-Post: 91 %
FEV6-POST: 2.6 L
FEV6-Pre: 2.57 L
FEV6FVC-%Change-Post: 0 %
FEV6FVC-%PRED-POST: 104 %
FEV6FVC-%Pred-Pre: 103 %
FVC-%Change-Post: 0 %
FVC-%PRED-PRE: 87 %
FVC-%Pred-Post: 87 %
FVC-POST: 2.6 L
FVC-PRE: 2.58 L
POST FEV6/FVC RATIO: 100 %
PRE FEV1/FVC RATIO: 72 %
Post FEV1/FVC ratio: 74 %
Pre FEV6/FVC Ratio: 100 %
RV % PRED: 82 %
RV: 1.51 L
TLC % PRED: 90 %
TLC: 4.16 L

## 2017-05-16 NOTE — Progress Notes (Signed)
PFT completed today.  

## 2017-05-16 NOTE — Progress Notes (Signed)
[facility]  Chief Complaint  Patient presents with  . Follow-up    dyspnea, PFT done today.     Primary care: Florene Route  Vital signs: BP (!) 144/86 (BP Location: Left Arm, Cuff Size: Normal)   Pulse 98   Ht 5' 1"  (1.549 m)   Wt 177 lb (80.3 kg)   SpO2 98%   BMI 33.44 kg/m   History of Present Illness: Gabrielle [name] is a 62 y.o. female former smoker with wheezing and nocturnal hypoxia after RULectomy in March 6812 for NSCLC complicated by steroid responsive pulmonary infiltrates.  She had PFT earlier today.  Mild diffusion defect, otherwise normal.  Diffusion capacity improved compared to PFT from 2017.  Has cough with clear sputum.  No having fever, chest pain, hemoptysis, wheeze, allergies, skin rash, or swelling.  She doesn't feel like spiriva helps much.  No using albuterol much.  Physical Exam:  General - pleasant Eyes - pupils reactive ENT - no sinus tenderness, no oral exudate, no LAN Cardiac - regular, no murmur Chest - no wheeze, rales Abd - soft, non tender Ext - no edema Skin - no rashes Neuro - normal strength Psych - normal mood  Discussion: She has been labeled as having COPD.  Her PFTs are not consistent with chronic obstruction.  She most likely has asthma as a cause of her symptoms.  Assessment/Plan:  Persistent asthma. - continue symbicort - prn albuterol - d/c spiriva - try mucinex to help with phlegm and cough  Nocturnal hypoxemia. - repeat ONO on room air  Hx of lung cancer. - she has f/u CT chest with oncology in May 2019   Patient Instructions  Will arrange for overnight oxygen test  Can try mucinex to help with cough  Follow up in 3 months    Chesley Mires, MD [place] 05/16/2017, 6:18 PM  Flow Sheet  Pulmonary tests: PET scan 7/51/70 >> hypermetabolic RUL nodule 1.4 x 1.3 cm PFT 04/07/14 >> FEV1 1.91 (85%), FEV1% 73, TLC 4.22 (98%), DLCO 56% Labs  05/06/15 >> CPK 59, RF < 10, ANA negative, ESR 23 PFT 05/21/15 >> FEV1 1.96 (83%), FEV1% 73, FEF 25-75% 1.56 (68%), TLC 4.46 (96%), DLCO 55%, +BD from FEF 25-75% FeNO 12/03/15 >> 5 PFT 05/16/17 >> FEV1 1.93 (84%), FEV1% 74, TLC 4.16 (90%), DLCO 71%, no BD  Sleep tests ONO with RA 07/24/14 >> test time 9 hrs 57 min. Mean SpO2 91.8%, low SpO2 82%. Spent 1 hr 59 min with SpO2 < 88%. ONO on RA 06/01/15 >>test time 7 hrs 50 min. Mean SpO2 90.07%, low SpO2 67%. Spent 2 hrs 24 min with SpO2 <88%. She needs to continue using supplemental oxygen at night  Cardiac tests Echo 04/28/14 >> EF 55 to 01%, grade 1 diastolic dysfx  Past Medical History: She  has a past medical history of Anemia, Asthma, Breast cancer ([place]) (04/2012), Carpal tunnel syndrome, Chronic pain in left foot, Cough, Depression, Diarrhea, GERD (gastroesophageal reflux disease), H/O eating disorder, History of anemia, Hyperlipidemia, Invasive ductal carcinoma of right breast ([place]) (08/21/2012), Lung cancer ([place]), Orthostatic hypotension, Pneumonia, S/P bilateral breast implants (08/21/2012), and Tobacco abuse (08/21/2012).  Past Surgical History: She  has a past surgical history that includes Breast lumpectomy with sentinel lymph node bx (Right, 04/2012); Abdominal hysterectomy; Tonsillectomy; Video bronchoscopy (N/A, 04/20/2014); Video assisted thoracoscopy (vats)/wedge resection (Right, 04/20/2014); Wedge resection (Right, 04/20/2014); Lobectomy (Right, 04/20/2014); Node dissection (Right, 04/20/2014); Breast lumpectomy (Left, 1990); and  Esophagogastroduodenoscopy (egd) with propofol (N/A, 05/10/2017).  Family History: Her family history includes Hypertension in her father and mother; Leukemia in her maternal grandmother; Lymphoma (age of onset: 38) in her maternal aunt.  Social History: She  reports that she quit smoking about 3 years ago. Her smoking use included cigarettes. She has a 40.00 pack-year smoking history. She has never used  smokeless tobacco. She reports that she drinks about 0.6 oz of alcohol per week. She reports that she does not use drugs.  Medications: Allergies as of 05/16/2017      Reactions   Hydrocodone Other (See Comments)   Makes her hyper      Medication List        Accurate as of 05/16/17  6:18 PM. Always use your most recent med list.          acetaminophen 500 MG tablet Commonly known as:  TYLENOL Take 1,000 mg by mouth every 6 (six) hours as needed for moderate pain or headache.   albuterol 108 (90 Base) MCG/ACT inhaler Commonly known as:  PROVENTIL HFA;VENTOLIN HFA Inhale 2 puffs into the lungs every 6 (six) hours as needed for wheezing or shortness of breath.   anastrozole 1 MG tablet Commonly known as:  ARIMIDEX Take 1 tablet (1 mg total) by mouth daily.   ASPIRIN 81 81 MG EC tablet Generic drug:  aspirin Take 81 mg by mouth daily. Swallow whole.   atorvastatin 20 MG tablet Commonly known as:  LIPITOR Take 20 mg by mouth at bedtime.   budesonide-formoterol 160-4.5 MCG/ACT inhaler Commonly known as:  SYMBICORT Inhale 2 puffs into the lungs 2 (two) times daily.   CALCIUM-VITAMIN D3 PO Take 1 tablet by mouth daily.   diclofenac 75 MG EC tablet Commonly known as:  VOLTAREN Take 75 mg by mouth daily.   fluconazole 100 MG tablet Commonly known as:  DIFLUCAN Take 1 tablet (100 mg total) by mouth as directed. Take 200 mg today then take 100 mg daily for 10 days   gabapentin 800 MG tablet Commonly known as:  NEURONTIN Take 800 mg by mouth 3 (three) times daily.   hydrOXYzine 50 MG tablet Commonly known as:  ATARAX/VISTARIL Take 50 mg by mouth at bedtime.   ibuprofen 200 MG tablet Commonly known as:  ADVIL,MOTRIN Take 200-400 mg by mouth every 6 (six) hours as needed for headache or moderate pain.   omeprazole 40 MG capsule Commonly known as:  PRILOSEC Take 40 mg by mouth 2 (two) times daily before a meal.   trazodone 300 MG tablet Commonly known as:   DESYREL Take 300 mg by mouth at bedtime.

## 2017-05-16 NOTE — Patient Instructions (Signed)
Will arrange for overnight oxygen test  Can try mucinex to help with cough  Follow up in 3 months

## 2017-05-25 ENCOUNTER — Telehealth: Payer: Self-pay | Admitting: Pulmonary Disease

## 2017-05-25 NOTE — Telephone Encounter (Signed)
Called and spoke with pt letting her know she can get mucinex OTC without an Rx.  Pt expressed understanding. Nothing further needed at this time.

## 2017-05-30 DIAGNOSIS — J449 Chronic obstructive pulmonary disease, unspecified: Secondary | ICD-10-CM | POA: Diagnosis not present

## 2017-05-30 DIAGNOSIS — R0902 Hypoxemia: Secondary | ICD-10-CM | POA: Diagnosis not present

## 2017-05-31 DIAGNOSIS — C3490 Malignant neoplasm of unspecified part of unspecified bronchus or lung: Secondary | ICD-10-CM | POA: Diagnosis not present

## 2017-06-05 ENCOUNTER — Ambulatory Visit: Payer: BLUE CROSS/BLUE SHIELD | Admitting: Gastroenterology

## 2017-06-08 DIAGNOSIS — I10 Essential (primary) hypertension: Secondary | ICD-10-CM | POA: Diagnosis not present

## 2017-06-08 DIAGNOSIS — S43431A Superior glenoid labrum lesion of right shoulder, initial encounter: Secondary | ICD-10-CM | POA: Diagnosis not present

## 2017-06-08 DIAGNOSIS — E782 Mixed hyperlipidemia: Secondary | ICD-10-CM | POA: Diagnosis not present

## 2017-06-08 DIAGNOSIS — C3491 Malignant neoplasm of unspecified part of right bronchus or lung: Secondary | ICD-10-CM | POA: Diagnosis not present

## 2017-06-11 ENCOUNTER — Telehealth: Payer: Self-pay | Admitting: Pulmonary Disease

## 2017-06-11 NOTE — Telephone Encounter (Signed)
ONO with RA 05/30/17 >> test time 4 hrs 44 min.  Average SpO2 90%, low SpO2 78%.  Spent 1 hr 7 min with SpO2 < 88%.   Please let her know that her oxygen test shows her oxygen level is still low at night.  She should continue to use oxygen at night.

## 2017-06-12 NOTE — Telephone Encounter (Signed)
Left voice mail on machine for patient to return phone call back regarding results. X1  

## 2017-06-13 DIAGNOSIS — C3411 Malignant neoplasm of upper lobe, right bronchus or lung: Secondary | ICD-10-CM | POA: Diagnosis not present

## 2017-06-13 NOTE — Telephone Encounter (Signed)
Called and spoke with patient regarding results.  Informed the patient of results and recommendations today. Pt verbalized understanding and denied any questions or concerns at this time.  Nothing further needed.  

## 2017-06-14 DIAGNOSIS — M19011 Primary osteoarthritis, right shoulder: Secondary | ICD-10-CM | POA: Diagnosis not present

## 2017-06-14 DIAGNOSIS — S43431A Superior glenoid labrum lesion of right shoulder, initial encounter: Secondary | ICD-10-CM | POA: Diagnosis not present

## 2017-06-14 DIAGNOSIS — M25511 Pain in right shoulder: Secondary | ICD-10-CM | POA: Diagnosis not present

## 2017-06-15 ENCOUNTER — Encounter: Payer: BLUE CROSS/BLUE SHIELD | Admitting: Gastroenterology

## 2017-06-15 DIAGNOSIS — C50911 Malignant neoplasm of unspecified site of right female breast: Secondary | ICD-10-CM | POA: Diagnosis not present

## 2017-06-15 DIAGNOSIS — R296 Repeated falls: Secondary | ICD-10-CM | POA: Diagnosis not present

## 2017-06-15 DIAGNOSIS — E871 Hypo-osmolality and hyponatremia: Secondary | ICD-10-CM | POA: Diagnosis not present

## 2017-06-15 DIAGNOSIS — Z79811 Long term (current) use of aromatase inhibitors: Secondary | ICD-10-CM | POA: Diagnosis not present

## 2017-06-15 DIAGNOSIS — M75101 Unspecified rotator cuff tear or rupture of right shoulder, not specified as traumatic: Secondary | ICD-10-CM | POA: Diagnosis not present

## 2017-06-15 DIAGNOSIS — R0602 Shortness of breath: Secondary | ICD-10-CM | POA: Diagnosis not present

## 2017-06-15 DIAGNOSIS — C3410 Malignant neoplasm of upper lobe, unspecified bronchus or lung: Secondary | ICD-10-CM | POA: Diagnosis not present

## 2017-06-21 DIAGNOSIS — M25511 Pain in right shoulder: Secondary | ICD-10-CM | POA: Diagnosis not present

## 2017-06-25 NOTE — Pre-Procedure Instructions (Signed)
Gabrielle [name]  06/25/2017      [facility] [address], [place] [address] Phone: [phone] Fax: [phone]    Your procedure is scheduled on June 27, 2017.  Report to [facility] Admitting at 630 AM.  Call this number if you have problems the morning of surgery:  [phone]   Remember:  No food or drink after midnight.  Continue all medications as directed by your physician except follow these medication instructions before surgery below    Take these medicines the morning of surgery with A SIP OF WATER  Tylenol-if needed Albuterol inhaler-if needed (bring inhaler with you) Anastrozole (arimidex) symbicort inhaler Gabapentin (neurontin) Omeprazole (prilosec) Sucralfate (carafate) Spiriva inhaler  Follow your doctor's instructions on when to hold/resume aspirin  Beginning now, STOP taking any diclofenac (voltaren), Aleve, Naproxen, Ibuprofen, Motrin, Advil, Goody's, BC's, all herbal medications, fish oil, and all vitamins              Do not wear jewelry, make-up or nail polish.  Do not wear lotions, powders, or perfumes, or deodorant.  Do not shave 48 hours prior to surgery.    Do not bring valuables to the hospital.  [facility] is not responsible for any belongings or valuables.  Contacts, dentures or bridgework may not be worn into surgery.  Leave your suitcase in the car.  After surgery it may be brought to your room.  For patients admitted to the hospital, discharge time will be determined by your treatment team.  Patients discharged the day of surgery will not be allowed to drive home.    [facility]- Preparing For Surgery  Before surgery, you can play an important role. Because skin is not sterile, your skin needs to be as free of germs as possible. You can reduce the number of germs on your skin by washing with CHG (chlorahexidine gluconate) Soap before surgery.  CHG is an antiseptic cleaner  which kills germs and bonds with the skin to continue killing germs even after washing.    Oral Hygiene is also important to reduce your risk of infection.  Remember - BRUSH YOUR TEETH THE MORNING OF SURGERY WITH YOUR REGULAR TOOTHPASTE  Please do not use if you have an allergy to CHG or antibacterial soaps. If your skin becomes reddened/irritated stop using the CHG.  Do not shave (including legs and underarms) for at least 48 hours prior to first CHG shower. It is OK to shave your face.  Please follow these instructions carefully.   1. Shower the NIGHT BEFORE SURGERY and the MORNING OF SURGERY with CHG.   2. If you chose to wash your hair, wash your hair first as usual with your normal shampoo.  3. After you shampoo, rinse your hair and body thoroughly to remove the shampoo.  4. Use CHG as you would any other liquid soap. You can apply CHG directly to the skin and wash gently with a scrungie or a clean washcloth.   5. Apply the CHG Soap to your body ONLY FROM THE NECK DOWN.  Do not use on open wounds or open sores. Avoid contact with your eyes, ears, mouth and genitals (private parts). Wash Face and genitals (private parts)  with your normal soap.  6. Wash thoroughly, paying special attention to the area where your surgery will be performed.  7. Thoroughly rinse your body with warm water from the neck down.  8.  DO NOT shower/wash with your normal soap after using and rinsing off the CHG Soap.  9. Pat yourself dry with a CLEAN TOWEL.  10. Wear CLEAN PAJAMAS to bed the night before surgery, wear comfortable clothes the morning of surgery  11. Place CLEAN SHEETS on your bed the night of your first shower and DO NOT SLEEP WITH PETS.  Day of Surgery:  Do not apply any deodorants/lotions.  Please wear clean clothes to the hospital/surgery center.   Remember to brush your teeth WITH YOUR REGULAR TOOTHPASTE.  Please read over the following fact sheets that you were given. Pain Booklet,  Coughing and Deep Breathing and Surgical Site Infection Prevention

## 2017-06-26 ENCOUNTER — Encounter (HOSPITAL_COMMUNITY): Payer: Self-pay

## 2017-06-26 ENCOUNTER — Other Ambulatory Visit: Payer: Self-pay

## 2017-06-26 ENCOUNTER — Encounter (HOSPITAL_COMMUNITY)
Admission: RE | Admit: 2017-06-26 | Discharge: 2017-06-26 | Disposition: A | Discharge status: Home / Self Care | Payer: BLUE CROSS/BLUE SHIELD | Source: Ambulatory Visit | Attending: Orthopaedic Surgery | Admitting: Orthopaedic Surgery

## 2017-06-26 DIAGNOSIS — Z01812 Encounter for preprocedural laboratory examination: Secondary | ICD-10-CM | POA: Insufficient documentation

## 2017-06-26 DIAGNOSIS — M25511 Pain in right shoulder: Secondary | ICD-10-CM | POA: Diagnosis not present

## 2017-06-26 HISTORY — DX: Unspecified osteoarthritis, unspecified site: M19.90

## 2017-06-26 HISTORY — DX: Legal blindness, as defined in USA: H54.8

## 2017-06-26 LAB — BASIC METABOLIC PANEL
ANION GAP: 13 (ref 5–15)
BUN: 15 mg/dL (ref 6–20)
CALCIUM: 9.9 mg/dL (ref 8.9–10.3)
CO2: 25 mmol/L (ref 22–32)
Chloride: 99 mmol/L — ABNORMAL LOW (ref 101–111)
Creatinine, Ser: 0.81 mg/dL (ref 0.44–1.00)
GFR calc Af Amer: >60 mL/min (ref >60)
GFR calc non Af Amer: >60 mL/min (ref >60)
GLUCOSE: 118 mg/dL — AB (ref 65–99)
Potassium: 4 mmol/L (ref 3.5–5.1)
Sodium: 137 mmol/L (ref 135–145)

## 2017-06-26 LAB — CBC
HEMATOCRIT: 39.3 % (ref 36.0–46.0)
Hemoglobin: 13.5 g/dL (ref 12.0–15.0)
MCH: 32.8 pg (ref 26.0–34.0)
MCHC: 34.4 g/dL (ref 30.0–36.0)
MCV: 95.6 fL (ref 78.0–100.0)
Platelets: 313 10*3/uL (ref 150–400)
RBC: 4.11 MIL/uL (ref 3.87–5.11)
RDW: 13.2 % (ref 11.5–15.5)
WBC: 8.9 10*3/uL (ref 4.0–10.5)

## 2017-06-26 MED ORDER — MIDAZOLAM HCL 2 MG/2ML IJ SOLN
0.5000 mg | Freq: Once | INTRAMUSCULAR | Status: AC | PRN
  Administered 2017-06-27: 2 mg via INTRAVENOUS

## 2017-06-26 MED ORDER — PROMETHAZINE HCL 25 MG/ML IJ SOLN: 6.2500 mg | INTRAMUSCULAR | Status: DC | PRN

## 2017-06-26 MED ORDER — MEPERIDINE HCL 50 MG/ML IJ SOLN: 6.2500 mg | INTRAMUSCULAR | Status: DC | PRN

## 2017-06-26 NOTE — Progress Notes (Signed)
PAT follow-up: Willeen Cass, NP evaluated patient during her PAT visit this morning due to "rash" on her back and groin. Reportedly, there were also several areas that looked like "bug bites" with some excoriations thought due to scratching. Angela notified Dr. Rich Fuchs staff, and he requested that patient been seen at his office today. Sherri called to report that Dr. Griffin Basil evaluated patient and felt she was okay to proceed with surgery as planned.  [facility] Short Stay Center/Anesthesiology Phone [phone] 06/26/2017 12:23 PM

## 2017-06-26 NOTE — Progress Notes (Signed)
Ms Tobey c/o of a rash that she has had on her back ,right side and area on left side and in groin area. Rash is red and itches. Patient states that she has had it for a while, she has treated it with calamine lotion and strong steroid cream that of her husbands. Patient has not had a MD look at area.  Wende Bushy, NP-C assessed the area on her back and notified Dr Griffin Basil and patient was asked to go to Dr Rich Fuchs office after PAT appointment.

## 2017-06-26 NOTE — Pre-Procedure Instructions (Signed)
Gabrielle [name]  06/26/2017      [facility] [address] [place] [address] Phone: [phone] Fax: [phone]    Your procedure is scheduled on June 27, 2017.  Report to [facility] Admitting at 630 AM.  Call this number if you have problems the morning of surgery:  [phone]   Remember:  No food or drink after midnight.  Continue all medications as directed by your physician except follow these medication instructions before surgery below    Take these medicines the morning of surgery with A SIP OF WATER  Tylenol-if needed Albuterol inhaler-if needed (bring inhaler with you) Anastrozole (arimidex) symbicort inhaler Gabapentin (neurontin) Omeprazole (prilosec) Sucralfate (carafate) Spiriva inhaler  Follow your doctor's instructions on when to hold/resume aspirin  Beginning now, STOP taking any diclofenac (voltaren), Aleve, Naproxen, Ibuprofen, Motrin, Advil, Goody's, BC's, all herbal medications, fish oil, and all vitamins             [place]- Preparing For Surgery  Before surgery, you can play an important role. Because skin is not sterile, your skin needs to be as free of germs as possible. You can reduce the number of germs on your skin by washing with CHG (chlorahexidine gluconate) Soap before surgery.  CHG is an antiseptic cleaner which kills germs and bonds with the skin to continue killing germs even after washing.    Oral Hygiene is also important to reduce your risk of infection.  Remember - BRUSH YOUR TEETH THE MORNING OF SURGERY WITH YOUR REGULAR TOOTHPASTE  Please do not use if you have an allergy to CHG or antibacterial soaps. If your skin becomes reddened/irritated stop using the CHG.  Do not shave (including legs and underarms) for at least 48 hours prior to first CHG shower. It is OK to shave your face.  Please follow these instructions carefully.   1. Shower the NIGHT BEFORE SURGERY and  the MORNING OF SURGERY with CHG.   2. If you chose to wash your hair, wash your hair first as usual with your normal shampoo.  3. After you shampoo, rinse your hair and body thoroughly to remove the shampoo.  Wash your face and private area with the soap you use at home, then rinse.  4.  Use CHG as you would any other liquid soap. You can apply CHG directly to the skin and wash gently with a scrungie or a clean washcloth.        Apply the CHG Soap to your body ONLY FROM THE NECK DOWN.  Do not use on open wounds or open sores. Avoid contact with your eyes, ears, mouth and genitals (private parts).  5. Wash thoroughly, paying special attention to the area where your surgery will be performed.  6. Thoroughly rinse your body with warm water from the neck down.  7. DO NOT shower/wash with your normal soap after using and rinsing off the CHG Soap.  8. Pat yourself dry with a CLEAN TOWEL.  9. Wear CLEAN PAJAMAS to bed the night before surgery, wear comfortable clothes the morning of surgery  10. Place CLEAN SHEETS on your bed the night of your first shower and DO NOT SLEEP WITH PETS.  Day of Surgery: Shower as above Do not apply any deodorants/lotions, powders or colognes.  Please wear clean clothes to the hospital/surgery center.   Remember to brush your teeth WITH Toothpaste.  Do not wear jewelry,  make-up or nail polish.  Do not wear lotions, powders, or perfumes, or deodorant.  Do not shave 48 hours prior to surgery.    Do not bring valuables to the hospital.  [facility] is not responsible for any belongings or valuables.  Contacts, dentures or bridgework may not be worn into surgery.  Leave your suitcase in the car.  After surgery it may be brought to your room.  For patients admitted to the hospital, discharge time will be determined by your treatment team.  Patients discharged the day of surgery will not be allowed to drive.   Please read over the following fact sheets that you  were given. Pain Booklet, Coughing and Deep Breathing and Surgical Site Infection Prevention

## 2017-06-27 ENCOUNTER — Encounter (HOSPITAL_COMMUNITY): Payer: Self-pay | Admitting: Urology

## 2017-06-27 ENCOUNTER — Ambulatory Visit (HOSPITAL_COMMUNITY)
Admission: RE | Admit: 2017-06-27 | Discharge: 2017-06-27 | Disposition: A | Discharge status: Home / Self Care | Payer: BLUE CROSS/BLUE SHIELD | Source: Ambulatory Visit | Attending: Orthopaedic Surgery | Admitting: Orthopaedic Surgery

## 2017-06-27 ENCOUNTER — Encounter (HOSPITAL_COMMUNITY)
Admission: RE | Disposition: A | Discharge status: Home / Self Care | Payer: Self-pay | Source: Ambulatory Visit | Attending: Orthopaedic Surgery

## 2017-06-27 ENCOUNTER — Ambulatory Visit (HOSPITAL_COMMUNITY): Payer: BLUE CROSS/BLUE SHIELD | Admitting: Emergency Medicine

## 2017-06-27 ENCOUNTER — Ambulatory Visit (HOSPITAL_COMMUNITY): Payer: BLUE CROSS/BLUE SHIELD | Admitting: Certified Registered Nurse Anesthetist

## 2017-06-27 DIAGNOSIS — E785 Hyperlipidemia, unspecified: Secondary | ICD-10-CM | POA: Insufficient documentation

## 2017-06-27 DIAGNOSIS — M19011 Primary osteoarthritis, right shoulder: Secondary | ICD-10-CM | POA: Insufficient documentation

## 2017-06-27 DIAGNOSIS — G8918 Other acute postprocedural pain: Secondary | ICD-10-CM | POA: Diagnosis not present

## 2017-06-27 DIAGNOSIS — Z853 Personal history of malignant neoplasm of breast: Secondary | ICD-10-CM | POA: Diagnosis not present

## 2017-06-27 DIAGNOSIS — I10 Essential (primary) hypertension: Secondary | ICD-10-CM | POA: Insufficient documentation

## 2017-06-27 DIAGNOSIS — Z85118 Personal history of other malignant neoplasm of bronchus and lung: Secondary | ICD-10-CM | POA: Diagnosis not present

## 2017-06-27 DIAGNOSIS — Z87891 Personal history of nicotine dependence: Secondary | ICD-10-CM | POA: Diagnosis not present

## 2017-06-27 DIAGNOSIS — J449 Chronic obstructive pulmonary disease, unspecified: Secondary | ICD-10-CM | POA: Diagnosis not present

## 2017-06-27 DIAGNOSIS — Z6831 Body mass index (BMI) 31.0-31.9, adult: Secondary | ICD-10-CM | POA: Insufficient documentation

## 2017-06-27 DIAGNOSIS — M7541 Impingement syndrome of right shoulder: Secondary | ICD-10-CM | POA: Diagnosis not present

## 2017-06-27 DIAGNOSIS — E669 Obesity, unspecified: Secondary | ICD-10-CM | POA: Diagnosis not present

## 2017-06-27 DIAGNOSIS — H548 Legal blindness, as defined in USA: Secondary | ICD-10-CM | POA: Diagnosis not present

## 2017-06-27 DIAGNOSIS — X58XXXA Exposure to other specified factors, initial encounter: Secondary | ICD-10-CM | POA: Insufficient documentation

## 2017-06-27 DIAGNOSIS — S46211A Strain of muscle, fascia and tendon of other parts of biceps, right arm, initial encounter: Secondary | ICD-10-CM | POA: Diagnosis not present

## 2017-06-27 DIAGNOSIS — S46011A Strain of muscle(s) and tendon(s) of the rotator cuff of right shoulder, initial encounter: Secondary | ICD-10-CM | POA: Insufficient documentation

## 2017-06-27 DIAGNOSIS — Z885 Allergy status to narcotic agent status: Secondary | ICD-10-CM | POA: Insufficient documentation

## 2017-06-27 DIAGNOSIS — Z7982 Long term (current) use of aspirin: Secondary | ICD-10-CM | POA: Diagnosis not present

## 2017-06-27 DIAGNOSIS — M24111 Other articular cartilage disorders, right shoulder: Secondary | ICD-10-CM | POA: Diagnosis not present

## 2017-06-27 DIAGNOSIS — M7521 Bicipital tendinitis, right shoulder: Secondary | ICD-10-CM | POA: Diagnosis not present

## 2017-06-27 DIAGNOSIS — K219 Gastro-esophageal reflux disease without esophagitis: Secondary | ICD-10-CM | POA: Diagnosis not present

## 2017-06-27 DIAGNOSIS — F329 Major depressive disorder, single episode, unspecified: Secondary | ICD-10-CM | POA: Diagnosis not present

## 2017-06-27 DIAGNOSIS — Z79899 Other long term (current) drug therapy: Secondary | ICD-10-CM | POA: Diagnosis not present

## 2017-06-27 HISTORY — PX: SHOULDER ARTHROSCOPY WITH SUBACROMIAL DECOMPRESSION, ROTATOR CUFF REPAIR AND BICEP TENDON REPAIR: SHX5687

## 2017-06-27 SURGERY — SHOULDER ARTHROSCOPY WITH SUBACROMIAL DECOMPRESSION, ROTATOR CUFF REPAIR AND BICEP TENDON REPAIR
Anesthesia: General | Site: Shoulder | Laterality: Right

## 2017-06-27 MED ORDER — PROMETHAZINE HCL 25 MG/ML IJ SOLN: 6.2500 mg | INTRAMUSCULAR | Status: DC | PRN

## 2017-06-27 MED ORDER — CEFAZOLIN SODIUM-DEXTROSE 2-4 GM/100ML-% IV SOLN
2.0000 g | INTRAVENOUS | Status: AC
  Administered 2017-06-27: 2 g via INTRAVENOUS

## 2017-06-27 MED ORDER — HYDROMORPHONE HCL 2 MG/ML IJ SOLN
0.2500 mg | INTRAMUSCULAR | Status: DC | PRN
  Administered 2017-06-27 (×2): 0.5 mg via INTRAVENOUS

## 2017-06-27 MED ORDER — CHLORHEXIDINE GLUCONATE 4 % EX LIQD: 60.0000 mL | Freq: Once | CUTANEOUS | Status: DC

## 2017-06-27 MED ORDER — EPHEDRINE SULFATE 50 MG/ML IJ SOLN
INTRAMUSCULAR | Status: AC
  Filled 2017-06-27: qty 1

## 2017-06-27 MED ORDER — LIDOCAINE HCL (CARDIAC) PF 100 MG/5ML IV SOSY
PREFILLED_SYRINGE | INTRAVENOUS | Status: DC | PRN
  Administered 2017-06-27: 80 mg via INTRAVENOUS

## 2017-06-27 MED ORDER — PROPOFOL 10 MG/ML IV BOLUS
INTRAVENOUS | Status: DC | PRN
  Administered 2017-06-27: 150 mg via INTRAVENOUS

## 2017-06-27 MED ORDER — MELOXICAM 7.5 MG PO TABS: 7.5000 mg | ORAL_TABLET | Freq: Every day | ORAL | 2 refills | Status: DC

## 2017-06-27 MED ORDER — OXYCODONE HCL 5 MG/5ML PO SOLN: 5.0000 mg | Freq: Once | ORAL | Status: AC | PRN

## 2017-06-27 MED ORDER — ONDANSETRON HCL 4 MG/2ML IJ SOLN
INTRAMUSCULAR | Status: DC | PRN
  Administered 2017-06-27: 4 mg via INTRAVENOUS

## 2017-06-27 MED ORDER — OXYCODONE HCL 5 MG PO TABS
5.0000 mg | ORAL_TABLET | Freq: Once | ORAL | Status: AC | PRN
  Administered 2017-06-27: 5 mg via ORAL

## 2017-06-27 MED ORDER — FENTANYL CITRATE (PF) 250 MCG/5ML IJ SOLN
INTRAMUSCULAR | Status: AC
  Filled 2017-06-27: qty 5

## 2017-06-27 MED ORDER — ONDANSETRON HCL 4 MG PO TABS: 4.0000 mg | ORAL_TABLET | Freq: Three times a day (TID) | ORAL | 1 refills | Status: AC | PRN

## 2017-06-27 MED ORDER — PHENYLEPHRINE 40 MCG/ML (10ML) SYRINGE FOR IV PUSH (FOR BLOOD PRESSURE SUPPORT)
PREFILLED_SYRINGE | INTRAVENOUS | Status: AC
  Filled 2017-06-27: qty 10

## 2017-06-27 MED ORDER — ROCURONIUM BROMIDE 100 MG/10ML IV SOLN
INTRAVENOUS | Status: DC | PRN
  Administered 2017-06-27: 50 mg via INTRAVENOUS

## 2017-06-27 MED ORDER — ACETAMINOPHEN 500 MG PO TABS: 1000.0000 mg | ORAL_TABLET | Freq: Three times a day (TID) | ORAL | 0 refills | Status: AC

## 2017-06-27 MED ORDER — OXYCODONE HCL 5 MG PO TABS
ORAL_TABLET | ORAL | Status: AC
  Filled 2017-06-27: qty 1

## 2017-06-27 MED ORDER — DEXAMETHASONE SODIUM PHOSPHATE 10 MG/ML IJ SOLN
INTRAMUSCULAR | Status: DC | PRN
  Administered 2017-06-27: 10 mg via INTRAVENOUS

## 2017-06-27 MED ORDER — SODIUM CHLORIDE 0.9 % IR SOLN
Status: DC | PRN
  Administered 2017-06-27: 30000 mL

## 2017-06-27 MED ORDER — FENTANYL CITRATE (PF) 100 MCG/2ML IJ SOLN
INTRAMUSCULAR | Status: DC | PRN
  Administered 2017-06-27: 100 ug via INTRAVENOUS

## 2017-06-27 MED ORDER — BUPIVACAINE HCL (PF) 0.5 % IJ SOLN
INTRAMUSCULAR | Status: DC | PRN
  Administered 2017-06-27: 30 mL via PERINEURAL

## 2017-06-27 MED ORDER — STERILE WATER FOR IRRIGATION IR SOLN
Status: DC | PRN
  Administered 2017-06-27: 1000 mL

## 2017-06-27 MED ORDER — SUGAMMADEX SODIUM 200 MG/2ML IV SOLN
INTRAVENOUS | Status: DC | PRN
  Administered 2017-06-27: 200 mg via INTRAVENOUS

## 2017-06-27 MED ORDER — DIPHENHYDRAMINE HCL 50 MG/ML IJ SOLN
INTRAMUSCULAR | Status: DC | PRN
  Administered 2017-06-27: 12.5 mg via INTRAVENOUS

## 2017-06-27 MED ORDER — MIDAZOLAM HCL 2 MG/2ML IJ SOLN
INTRAMUSCULAR | Status: AC
  Filled 2017-06-27: qty 2

## 2017-06-27 MED ORDER — PHENYLEPHRINE HCL 10 MG/ML IJ SOLN
INTRAVENOUS | Status: DC | PRN
  Administered 2017-06-27: 50 ug/min via INTRAVENOUS

## 2017-06-27 MED ORDER — LIDOCAINE 2% (20 MG/ML) 5 ML SYRINGE
INTRAMUSCULAR | Status: AC
Start: 2017-06-27 — End: ?
  Filled 2017-06-27: qty 5

## 2017-06-27 MED ORDER — DIPHENHYDRAMINE HCL 50 MG/ML IJ SOLN
INTRAMUSCULAR | Status: AC
  Filled 2017-06-27: qty 1

## 2017-06-27 MED ORDER — PHENYLEPHRINE HCL 10 MG/ML IJ SOLN
INTRAMUSCULAR | Status: DC | PRN
  Administered 2017-06-27 (×2): 120 ug via INTRAVENOUS

## 2017-06-27 MED ORDER — OXYCODONE HCL 5 MG PO TABS: ORAL_TABLET | ORAL | 0 refills | Status: AC

## 2017-06-27 MED ORDER — DEXAMETHASONE SODIUM PHOSPHATE 10 MG/ML IJ SOLN
INTRAMUSCULAR | Status: AC
Start: 2017-06-27 — End: ?
  Filled 2017-06-27: qty 1

## 2017-06-27 MED ORDER — PROPOFOL 10 MG/ML IV BOLUS
INTRAVENOUS | Status: AC
  Filled 2017-06-27: qty 20

## 2017-06-27 MED ORDER — CEFAZOLIN SODIUM-DEXTROSE 2-4 GM/100ML-% IV SOLN
INTRAVENOUS | Status: AC
  Filled 2017-06-27: qty 100

## 2017-06-27 MED ORDER — EPINEPHRINE PF 1 MG/ML IJ SOLN
INTRAMUSCULAR | Status: AC
  Filled 2017-06-27: qty 4

## 2017-06-27 MED ORDER — HYDROMORPHONE HCL 2 MG/ML IJ SOLN
INTRAMUSCULAR | Status: AC
  Filled 2017-06-27: qty 1

## 2017-06-27 MED ORDER — MEPERIDINE HCL 50 MG/ML IJ SOLN: 6.2500 mg | INTRAMUSCULAR | Status: DC | PRN

## 2017-06-27 MED ORDER — ONDANSETRON HCL 4 MG/2ML IJ SOLN
INTRAMUSCULAR | Status: AC
  Filled 2017-06-27: qty 2

## 2017-06-27 MED ORDER — LACTATED RINGERS IV SOLN
INTRAVENOUS | Status: DC | PRN
  Administered 2017-06-27 (×2): via INTRAVENOUS

## 2017-06-27 SURGICAL SUPPLY — 54 items
AID PSTN UNV HD RSTRNT DISP (MISCELLANEOUS) ×1
ANCH SUT SWLK 19.1X4.75 (Anchor) ×2 IMPLANT
ANCHOR SUT BIO SW 4.75X19.1 (Anchor) ×2 IMPLANT
APL SKNCLS STERI-STRIP NONHPOA (GAUZE/BANDAGES/DRESSINGS)
BENZOIN TINCTURE PRP APPL 2/3 (GAUZE/BANDAGES/DRESSINGS) IMPLANT
BLADE CUTTER GATOR 3.5 (BLADE) ×2 IMPLANT
BLADE EXCALIBUR 4.0X13 (MISCELLANEOUS) ×2 IMPLANT
BUR OVAL 4.0 (BURR) IMPLANT
BURR OVAL 8 FLU 4.0X13 (MISCELLANEOUS) ×2 IMPLANT
CANNULA 5.75X71 LONG (CANNULA) ×2 IMPLANT
CANNULA PASSPORT BUTTON 10-40 (CANNULA) ×2 IMPLANT
CANNULA TWIST IN 8.25X7CM (CANNULA) ×2 IMPLANT
CHLORAPREP W/TINT 26ML (MISCELLANEOUS) ×2 IMPLANT
CLSR STERI-STRIP ANTIMIC 1/2X4 (GAUZE/BANDAGES/DRESSINGS) ×1 IMPLANT
DRAPE ORTHO SPLIT 77X108 STRL (DRAPES) ×4
DRAPE STERI 35X30 U-POUCH (DRAPES) ×2 IMPLANT
DRAPE SURG ORHT 6 SPLT 77X108 (DRAPES) IMPLANT
DRAPE U-SHAPE 47X51 STRL (DRAPES) ×2 IMPLANT
DRSG EMULSION OIL 3X3 NADH (GAUZE/BANDAGES/DRESSINGS) ×2 IMPLANT
ELECT REM PT RETURN 9FT ADLT (ELECTROSURGICAL)
ELECTRODE REM PT RTRN 9FT ADLT (ELECTROSURGICAL) IMPLANT
GAUZE SPONGE 4X4 12PLY STRL (GAUZE/BANDAGES/DRESSINGS) ×3 IMPLANT
GLOVE BIOGEL PI IND STRL 8 (GLOVE) ×1 IMPLANT
GLOVE BIOGEL PI INDICATOR 8 (GLOVE) ×1
GLOVE ECLIPSE 8.0 STRL XLNG CF (GLOVE) ×4 IMPLANT
GOWN STRL REUS W/ TWL LRG LVL3 (GOWN DISPOSABLE) ×2 IMPLANT
GOWN STRL REUS W/ TWL XL LVL3 (GOWN DISPOSABLE) ×1 IMPLANT
GOWN STRL REUS W/TWL LRG LVL3 (GOWN DISPOSABLE) ×4
GOWN STRL REUS W/TWL XL LVL3 (GOWN DISPOSABLE) ×4 IMPLANT
IMPL SPEEDBRIDGE KIT (Orthopedic Implant) IMPLANT
IMPLANT SPEEDBRIDGE KIT (Orthopedic Implant) ×2 IMPLANT
KIT BASIN OR (CUSTOM PROCEDURE TRAY) ×2 IMPLANT
MANIFOLD NEPTUNE II (INSTRUMENTS) ×2 IMPLANT
NEEDLE SCORPION MULTI FIRE (NEEDLE) ×2 IMPLANT
NS IRRIG 1000ML POUR BTL (IV SOLUTION) IMPLANT
PACK ARTHROSCOPY DSU (CUSTOM PROCEDURE TRAY) ×2 IMPLANT
PACK SHOULDER (CUSTOM PROCEDURE TRAY) ×4 IMPLANT
PAD ABD 8X10 STRL (GAUZE/BANDAGES/DRESSINGS) ×2 IMPLANT
PORT APPOLLO RF 90DEGREE MULTI (SURGICAL WAND) ×2 IMPLANT
RESTRAINT HEAD UNIVERSAL NS (MISCELLANEOUS) ×2 IMPLANT
SLING ARM FOAM STRAP LRG (SOFTGOODS) IMPLANT
SLING ARM FOAM STRAP MED (SOFTGOODS) IMPLANT
SLING ARM IMMOBILIZER LRG (SOFTGOODS) IMPLANT
SLING ARM IMMOBILIZER MED (SOFTGOODS) IMPLANT
SLING ARM XL FOAM STRAP (SOFTGOODS) IMPLANT
SPONGE LAP 4X18 RFD (DISPOSABLE) IMPLANT
STRIP CLOSURE SKIN 1/2X4 (GAUZE/BANDAGES/DRESSINGS) IMPLANT
SUT ETHILON 3 0 PS 1 (SUTURE) ×2 IMPLANT
SUT TIGER TAPE 7 IN WHITE (SUTURE) IMPLANT
TAPE CLOTH SURG 6X10 WHT LF (GAUZE/BANDAGES/DRESSINGS) ×1 IMPLANT
TAPE FIBER 2MM 7IN #2 BLUE (SUTURE) IMPLANT
TUBING ARTHROSCOPY IRRIG 16FT (MISCELLANEOUS) ×1 IMPLANT
WAND HAND CNTRL MULTIVAC 90 (MISCELLANEOUS) ×2 IMPLANT
WATER STERILE IRR 1000ML POUR (IV SOLUTION) ×2 IMPLANT

## 2017-06-27 NOTE — Transfer of Care (Signed)
Immediate Anesthesia Transfer of Care Note  Patient: Gabrielle [name]  Procedure(s) Performed: RIGHT SHOULDER ARTHROSCOPY WITH DEBRIDEMENT, SUBACROMIAL DECOMPRESSION AND ROTATOR CUFF REPAIR, BICEP TENODESIS (Right Shoulder)  Patient Location: PACU  Anesthesia Type:GA combined with regional for post-op pain  Level of Consciousness: awake and alert   Airway & Oxygen Therapy: Patient Spontanous Breathing and Patient connected to nasal cannula oxygen  Post-op Assessment: Report given to RN and Post -op Vital signs reviewed and stable  Post vital signs: Reviewed and stable  Last Vitals:  Vitals Value Taken Time  BP 146/67 06/27/2017 11:12 AM  Temp    Pulse 91 06/27/2017 11:18 AM  Resp 17 06/27/2017 11:18 AM  SpO2 96 % 06/27/2017 11:18 AM  Vitals shown include unvalidated device data.  Last Pain:  Vitals:   06/27/17 0642  PainSc: 0-No pain      Patients Stated Pain Goal: 0 (00/86/76 1950)  Complications: No apparent anesthesia complications

## 2017-06-27 NOTE — Anesthesia Postprocedure Evaluation (Signed)
Anesthesia Post Note  Patient: Gabrielle [name]  Procedure(s) Performed: RIGHT SHOULDER ARTHROSCOPY WITH DEBRIDEMENT, SUBACROMIAL DECOMPRESSION AND ROTATOR CUFF REPAIR, BICEP TENODESIS (Right Shoulder)     Patient location during evaluation: PACU Anesthesia Type: General Level of consciousness: awake and alert Pain management: pain level controlled Vital Signs Assessment: post-procedure vital signs reviewed and stable Respiratory status: spontaneous breathing, nonlabored ventilation and respiratory function stable Cardiovascular status: blood pressure returned to baseline and stable Postop Assessment: no apparent nausea or vomiting Anesthetic complications: no    Last Vitals:  Vitals:   06/27/17 1200 06/27/17 1215  BP: 136/80 (!) 149/92  Pulse: 85 85  Resp: (!) 24 18  Temp:  36.5 C  SpO2: 95% 93%    Last Pain:  Vitals:   06/27/17 1215  PainSc: [name]

## 2017-06-27 NOTE — Anesthesia Procedure Notes (Signed)
Procedure Name: Intubation Date/Time: 06/27/2017 8:39 AM Performed by: Inda Coke, CRNA Pre-anesthesia Checklist: Patient identified, Emergency Drugs available, Suction available and Patient being monitored Patient Re-evaluated:Patient Re-evaluated prior to induction Oxygen Delivery Method: Circle System Utilized Preoxygenation: Pre-oxygenation with 100% oxygen Induction Type: IV induction Ventilation: Mask ventilation without difficulty Laryngoscope Size: Mac and 3 Grade View: Grade I Tube type: Oral Tube size: 7.0 mm Number of attempts: 1 Airway Equipment and Method: Stylet and Oral airway Placement Confirmation: ETT inserted through vocal cords under direct vision,  positive ETCO2 and breath sounds checked- equal and bilateral Secured at: 22 cm Tube secured with: Tape Dental Injury: Teeth and Oropharynx as per pre-operative assessment

## 2017-06-27 NOTE — Anesthesia Preprocedure Evaluation (Signed)
Anesthesia Evaluation  Patient identified by MRN, date of birth, ID band Patient awake    Reviewed: Allergy & Precautions, NPO status , Patient's Chart, lab work & pertinent test results  History of Anesthesia Complications Negative for: history of anesthetic complications  Airway Mallampati: II  TM Distance: >3 FB Neck ROM: Full    Dental  (+) Dental Advisory Given   Pulmonary asthma , COPD,  COPD inhaler, former smoker,  Lung cancer: s/p lobectomy   breath sounds clear to auscultation       Cardiovascular hypertension (no linger requires meds), Pt. on medications  Rhythm:Regular Rate:Normal  '16 ECHO: EF 55-60%, valves OK   Neuro/Psych Depression negative neurological ROS     GI/Hepatic Neg liver ROS, GERD  Medicated and Controlled,  Endo/Other    Renal/GU negative Renal ROS     Musculoskeletal  (+) Arthritis ,   Abdominal (+) + obese,   Peds  Hematology negative hematology ROS (+)   Anesthesia Other Findings   Reproductive/Obstetrics                             Anesthesia Physical  Anesthesia Plan  ASA: II  Anesthesia Plan: General   Post-op Pain Management: GA combined w/ Regional for post-op pain   Induction:   PONV Risk Score and Plan: 3 and Treatment may vary due to age or medical condition, Ondansetron, Dexamethasone and Midazolam  Airway Management Planned: Oral ETT  Additional Equipment:   Intra-op Plan:   Post-operative Plan: Extubation in OR  Informed Consent: I have reviewed the patients History and Physical, chart, labs and discussed the procedure including the risks, benefits and alternatives for the proposed anesthesia with the patient or authorized representative who has indicated his/her understanding and acceptance.   Dental advisory given  Plan Discussed with: CRNA and Surgeon  Anesthesia Plan Comments:         Anesthesia Quick Evaluation

## 2017-06-27 NOTE — Progress Notes (Signed)
Orthopedic Tech Progress Note Patient Details:  Gabrielle [name] 1955/10/20 [id number]  Ortho Devices Type of Ortho Device: Shoulder abduction pillow Ortho Device/Splint Location: OR drop off       [place] Pink 06/27/2017, 12:06 PM

## 2017-06-27 NOTE — Op Note (Signed)
Orthopaedic Surgery Operative Note (CSN: [id number])  Gabrielle [name]  12-Mar-1955 Date of Surgery: 06/27/2017   Diagnoses:  right shoulder massive cuff tear, mild arthritis and traumatic injury  Procedure: Rotator cuff repair [id number] Extensive Debridement arthroscopic 29823 Subacromial decompression 29826 Distal clavicle excision    Operative Finding Successful completion of planned procedure.  Massive tear and tissue quality was poor.  Exam under anesthesia: Full motion somewhat unstable anteriorly Articular space: No loose bodies, capsule intact, sniffing anterior labral fraying with a traumatic rupture of the biceps Chondral surfaces: Scattered grade 2 changes inferiorly and anteriorly on the humerus as well as the glenoid Biceps: Traumatically torn, stump debrided with a shaver Subscapularis: Traumatically torn full substance repaired with single anchor Superior Cuff: Massive tear involving all of supra and infraspinatus with poor tendon quality Bursal side: Type I acromion from a type II converted, distal clavicle excision performed with significant mass-effect on the cuff.  Post-operative plan: The patient will be nonweightbearing in a sling with therapy to start 6 weeks postop.  The patient will be [place] home.  DVT prophylaxis not indicated in this ambulatory upper extremity patient.  Pain control with PRN pain medication preferring oral medicines.  Follow up plan will be scheduled in approximately 7 days for incision check and XR AP and outlet.  Post-Op Diagnosis: Same Surgeons:Primary: Hiram Gash, MD Assistants:Brandon Lynnell Jude Location: [facility] OR ROOM 07 Anesthesia: Choice Antibiotics: Ancef 2g preop Tourniquet time: * No tourniquets in log * Estimated Blood Loss: minimal Complications: None Specimens: None Implants: Implant Name Type Inv. Item Serial No. Manufacturer Lot No. LRB No. Used Action  ANCHOR SUT BIO SW 4.75X19.1 - [id number] Anchor ANCHOR SUT BIO SW  4.75X19.1  [employer] [id number] Right 1 Implanted  IMPLANT SPEEDBRIDGE KIT - [id number] Orthopedic Implant IMPLANT SPEEDBRIDGE KIT  [place] [id number] Right 1 Implanted  ANCHOR SUT BIO SW 4.75X19.1 - [id number] Anchor ANCHOR SUT BIO SW 4.75X19.1 [id number] [place] [id number] Right 1 Implanted    Indications for Surgery:   Gabrielle [name] is a 62 y.o. female with traumatic injury resulting in a massive rotator cuff tear.  She has many medical problems is not suitable to be done in the outpatient setting.  We long discussion with her about her options and her recovery after massive rotator cuff repair and due to her young age though she is somewhat physiologically older than stated age we felt that an attempted rotator cuff repair be warranted.  Benefits and risks of operative and nonoperative management were discussed prior to surgery with patient/guardian(s) and informed consent form was completed.  Specific risks including infection, need for additional surgery, 50% quoted failure rate of repair, continued pain, infection, stiffness.   Procedure:   Patient was correctly identified in the preoperative holding area and operative site marked.  Patient brought to OR and positioned beachchair on an [place] table ensuring that all bony prominences were padded and the head was in an appropriate location.  Anesthesia was induced and the operative shoulder was prepped and draped in the usual sterile fashion.  Timeout was called preincision.  A standard posterior viewing portal was made after localizing the portal with a spinal needle.  An anterior accessory portal was also made.  After clearing the articular space the camera was positioned in the subacromial space.  Findings above.  Performed an extensive debridement of the anterior labrum, the biceps stump and the interval in the setting of her massive tear.  These were performed with a  shaver and RF ablator  Subacromial decompression: We made a lateral  portal with spinal needle guidance. We then proceeded to debride bursal tissue extensively with a shaver and arthrocare device. At that point we continued to identify the borders of the acromion and identify the spur. We then carefully preserved the deltoid fascia and used a burr to convert the type 2 acromion to a Type 1 flat acromion without issue.  Distal Clavicle resection:  The scope was placed in the subacromial space from the posterior portal.  A hemostat was placed through the anterior portal and we spread at the [facility] joint.  A burr was then inserted and 10 mm of distal clavicle was resected taking care to avoid damage to the capsule around the joint and avoiding overhanging bone posteriorly.    Arthroscopic Rotator Cuff Repair: Tuberosity was prepared with a burr to a bleeding bed.  Following completion of the above we placed 3- 4.7 Swivelock anchor loaded with a tape at inserted at the medial articular margin and an scorpion suture passing device, shuttled  sutures medially in a horizontal mattress suture configuration.  We then tied using arthroscopic knot tying techniques  each suture to its partner reducing the tendon at the prepared insertion site.  The fiber tape was not tied. With a medial row tape limbs then incorporated, 3 anteriorly and 3 posteriorly, into each of two 4.75 PEEK SwiveLock anchors, each placed 8 to 10 mm below the tip of the tuberosity and spanning anterior-posterior width of the tear with care to avoid over tensioning.  We had a great repair but her tissue quality was relatively poor as was the bone quality.  The incisions were closed with absorbable monocryl, benzoin and steri strips.  A sterile dressing was placed along with a sling. The patient was awoken from general anesthesia and taken to the PACU in stable condition without complication.   Joya Gaskins, OPA-C, present and scrubbed throughout the case, critical for completion in a timely fashion, and for retraction,  instrumentation, closure.

## 2017-06-27 NOTE — Interval H&P Note (Signed)
Discussed case, risks and benefits with patient again.  All questions answered, no change to history.  Brynnlie Unterreiner MD  

## 2017-06-27 NOTE — Anesthesia Procedure Notes (Signed)
Anesthesia Regional Block: Interscalene brachial plexus block   Pre-Anesthetic Checklist: ,, timeout performed, Correct Patient, Correct Site, Correct Laterality, Correct Procedure, Correct Position, site marked, Risks and benefits discussed,  Surgical consent,  Pre-op evaluation,  At surgeon's request and post-op pain management  Laterality: Right  Prep: chloraprep       Needles:  Injection technique: Single-shot  Needle Type: Stimiplex     Needle Length: 9cm  Needle Gauge: 21     Additional Needles:   Procedures:,,,, ultrasound used (permanent image in chart),,,,  Narrative:  Start time: 06/27/2017 8:13 AM End time: 06/27/2017 8:18 AM Injection made incrementally with aspirations every 5 mL.  Performed by: Personally  Anesthesiologist: Lynda Rainwater, MD

## 2017-06-27 NOTE — H&P (Signed)
PREOPERATIVE H&P  Chief Complaint: right shoulder cartilage disorder impingement rotator cuff tear and biceps tenodesis  HPI: Gabrielle [name] is a 62 y.o. female who presents for preoperative history and physical with a diagnosis of right shoulder rotator cuff tear. Please see my clinic notes for full description of specifics regarding injury.  Symptoms are rated as moderate to severe, and have been worsening.  This is significantly impairing activities of daily living.  She has elected for surgical management.   Past Medical History:  Diagnosis Date  . Anemia   . Arthritis   . Asthma   . Breast cancer ([place]) 04/2012   right  . Carpal tunnel syndrome    bil  . Chronic pain in left foot   . Cough   . Depression    espisodic  . Diarrhea   . Dyspnea    with exertion an have failed over night oximenrty - wears oxygen at hs  . GERD (gastroesophageal reflux disease)   . H/O eating disorder   . History of anemia   . Hyperlipidemia   . Invasive ductal carcinoma of right breast ([place]) 08/21/2012  . Legally blind   . Lung cancer ([place])    right  . Orthostatic hypotension   . Pneumonia    03-13-17  . S/P bilateral breast implants 08/21/2012   Prior to cancer diagnosis  . Tobacco abuse 08/21/2012   > 40 pack years   Past Surgical History:  Procedure Laterality Date  . BREAST LUMPECTOMY Left 1990   [facility] / right was cancer  . BREAST LUMPECTOMY Right   . BREAST LUMPECTOMY WITH SENTINEL LYMPH NODE BIOPSY Right 04/2012  . ESOPHAGOGASTRODUODENOSCOPY (EGD) WITH PROPOFOL N/A 05/10/2017   Procedure: ESOPHAGOGASTRODUODENOSCOPY (EGD) WITH PROPOFOL;  Surgeon: Milus Banister, MD;  Location: [facility];  Service: Endoscopy;  Laterality: N/A;  . LOBECTOMY Right 04/20/2014   Procedure: LOBECTOMY, RIGHT UPPER LOBE WITH PLACEMENT OF ONQ PAIN PUMP;  Surgeon: Grace Isaac, MD;  Location: [place];  Service: Thoracic;  Laterality: Right;  . NODE DISSECTION Right 04/20/2014   Procedure: NODE  DISSECTION;  Surgeon: Grace Isaac, MD;  Location: [place];  Service: Thoracic;  Laterality: Right;  . PLACEMENT OF BREAST IMPLANTS  1993  . PLANTAR FASCIA RELEASE Left   . TONSILLECTOMY    . VAGINAL HYSTERECTOMY    . VIDEO ASSISTED THORACOSCOPY (VATS)/WEDGE RESECTION Right 04/20/2014   Procedure: VIDEO ASSISTED THORACOSCOPY ;  Surgeon: Grace Isaac, MD;  Location: [place];  Service: Thoracic;  Laterality: Right;  Marland Kitchen VIDEO BRONCHOSCOPY N/A 04/20/2014   Procedure: VIDEO BRONCHOSCOPY;  Surgeon: Grace Isaac, MD;  Location[facility] OR;  Service: Thoracic;  Laterality: N/A;  . WEDGE RESECTION Right 04/20/2014   Procedure: WEDGE RESECTION, RIGHT UPPER LOBE;  Surgeon: Grace Isaac, MD;  Location: [place];  Service: Thoracic;  Laterality: Right;   Social History   Socioeconomic History  . Marital status: Divorced    Spouse name: Not on file  . Number of children: 1  . Years of education: Not on file  . Highest education level: Not on file  Occupational History    Employer: [employer]  Social Needs  . Financial resource strain: Not on file  . Food insecurity:    Worry: Not on file    Inability: Not on file  . Transportation needs:    Medical: Not on file    Non-medical: Not on file  Tobacco Use  . Smoking status: Former  Smoker    Packs/day: 1.00    Years: 40.00    Pack years: 40.00    Types: Cigarettes    Last attempt to quit: 04/20/2014    Years since quitting: 3.1  . Smokeless tobacco: Never Used  Substance and Sexual Activity  . Alcohol use: Yes    Alcohol/week: 3.6 oz    Types: 6 Glasses of wine per week  . Drug use: No    Comment: history of substance abuse - none now-   . Sexual activity: Never  Lifestyle  . Physical activity:    Days per week: Not on file    Minutes per session: Not on file  . Stress: Not on file  Relationships  . Social connections:    Talks on phone: Not on file    Gets together: Not on file    Attends religious service: Not  on file    Active member of club or organization: Not on file    Attends meetings of clubs or organizations: Not on file    Relationship status: Not on file  Other Topics Concern  . Not on file  Social History Narrative  . Not on file   Family History  Problem Relation Age of Onset  . Hypertension Mother   . Hypertension Father   . Lymphoma Maternal Aunt 77  . Leukemia Maternal Grandmother        dx in her late 41s   Allergies  Allergen Reactions  . Hydrocodone Other (See Comments)    Makes her hyper   Prior to Admission medications   Medication Sig Start Date End Date Taking? Authorizing Provider  acetaminophen (TYLENOL) 500 MG tablet Take 1,000 mg by mouth every 6 (six) hours as needed for moderate pain or headache.   Yes [provider]  albuterol (PROVENTIL HFA;VENTOLIN HFA) 108 (90 Base) MCG/ACT inhaler Inhale 2 puffs into the lungs every 6 (six) hours as needed for wheezing or shortness of breath.   Yes [provider]  anastrozole (ARIMIDEX) 1 MG tablet Take 1 tablet (1 mg total) by mouth daily. 02/07/16  Yes Pen[place], Kelby Fam, MD  atorvastatin (LIPITOR) 20 MG tablet Take 20 mg by mouth at bedtime.    Yes [provider]  budesonide-formoterol (SYMBICORT) 160-4.5 MCG/ACT inhaler Inhale 2 puffs into the lungs 2 (two) times daily.   Yes [provider]  Calcium Carb-Cholecalciferol (CALCIUM 600+D) 600-800 MG-UNIT TABS Take 2 tablets by mouth daily.   Yes [provider]  gabapentin (NEURONTIN) 800 MG tablet Take 800 mg by mouth 3 (three) times daily.    Yes [provider]  hydrOXYzine (ATARAX/VISTARIL) 50 MG tablet Take 50 mg by mouth at bedtime.   Yes [provider]  lisinopril (PRINIVIL,ZESTRIL) 20 MG tablet Take 20 mg by mouth daily.   Yes [provider]  omeprazole (PRILOSEC) 40 MG capsule Take 40 mg by mouth daily.    Yes [provider]  sucralfate (CARAFATE) 1 g tablet Take 1 g by mouth 3  (three) times daily.   Yes [provider]  tiotropium (SPIRIVA HANDIHALER) 18 MCG inhalation capsule Place 18 mcg into inhaler and inhale daily.   Yes [provider]  trazodone (DESYREL) 300 MG tablet Take 300 mg by mouth at bedtime.    Yes [provider]  aspirin (ASPIRIN 81) 81 MG EC tablet Take 81 mg by mouth daily. Swallow whole.     [provider]  diclofenac (VOLTAREN) 75 MG EC tablet  Take 75 mg by mouth See admin instructions. Take 75 mg by mouth every other day as needed for pain    [provider]  diclofenac sodium (VOLTAREN) 1 % GEL Apply 1 application topically daily as needed (pain).    [provider]  fluconazole (DIFLUCAN) 100 MG tablet Take 1 tablet (100 mg total) by mouth as directed. Take 200 mg today then take 100 mg daily for 10 days Patient not taking: Reported on 05/16/2017 04/24/17   Willia Craze, NP     Positive ROS: All other systems have been reviewed and were otherwise negative with the exception of those mentioned in the HPI and as above.  Physical Exam: General: Alert, no acute distress Cardiovascular: No pedal edema Respiratory: No cyanosis, no use of accessory musculature GI: No organomegaly, abdomen is soft and non-tender Skin: No lesions in the area of chief complaint Neurologic: Sensation intact distally Psychiatric: Patient is competent for consent with normal mood and affect Lymphatic: No axillary or cervical lymphadenopathy  MUSCULOSKELETAL: RUE: +drop arm, NVID, 4 of 5 IS, Subscap strength, +AC and obriens  Assessment: right shoulder cartilage disorder impingement rotator cuff tear and biceps tenodesis  Plan: Plan for Procedure(s): RIGHT SHOULDER ARTHROSCOPY WITH DEBRIDEMENT, SUBACROMIAL DECOMPRESSION AND ROTATOR CUFF REPAIR, BICEP TENODESIS  The risks benefits and alternatives were discussed with the patient including but not limited to the risks of nonoperative treatment, versus  surgical intervention including infection, bleeding, nerve injury,  blood clots, cardiopulmonary complications, morbidity, mortality, among others, and they were willing to proceed.   Hiram Gash, MD  06/27/2017 6:40 AM

## 2017-06-29 ENCOUNTER — Encounter (HOSPITAL_COMMUNITY): Payer: Self-pay | Admitting: Orthopaedic Surgery

## 2017-07-01 DIAGNOSIS — C3490 Malignant neoplasm of unspecified part of unspecified bronchus or lung: Secondary | ICD-10-CM | POA: Diagnosis not present

## 2017-07-03 DIAGNOSIS — M25511 Pain in right shoulder: Secondary | ICD-10-CM | POA: Diagnosis not present

## 2017-07-06 DIAGNOSIS — J439 Emphysema, unspecified: Secondary | ICD-10-CM | POA: Diagnosis not present

## 2017-07-06 DIAGNOSIS — Z9889 Other specified postprocedural states: Secondary | ICD-10-CM | POA: Diagnosis not present

## 2017-07-06 DIAGNOSIS — C3410 Malignant neoplasm of upper lobe, unspecified bronchus or lung: Secondary | ICD-10-CM | POA: Diagnosis not present

## 2017-07-06 DIAGNOSIS — C3411 Malignant neoplasm of upper lobe, right bronchus or lung: Secondary | ICD-10-CM | POA: Diagnosis not present

## 2017-07-06 DIAGNOSIS — I700 Atherosclerosis of aorta: Secondary | ICD-10-CM | POA: Diagnosis not present

## 2017-07-06 DIAGNOSIS — Z9882 Breast implant status: Secondary | ICD-10-CM | POA: Diagnosis not present

## 2017-07-13 ENCOUNTER — Ambulatory Visit: Payer: BLUE CROSS/BLUE SHIELD | Admitting: Pulmonary Disease

## 2017-07-13 ENCOUNTER — Encounter: Payer: Self-pay | Admitting: Pulmonary Disease

## 2017-07-13 VITALS — BP 128/80 | HR 91 | Ht 61.0 in | Wt 163.0 lb

## 2017-07-13 DIAGNOSIS — J9611 Chronic respiratory failure with hypoxia: Secondary | ICD-10-CM

## 2017-07-13 DIAGNOSIS — J4541 Moderate persistent asthma with (acute) exacerbation: Secondary | ICD-10-CM

## 2017-07-13 DIAGNOSIS — G4734 Idiopathic sleep related nonobstructive alveolar hypoventilation: Secondary | ICD-10-CM | POA: Diagnosis not present

## 2017-07-13 NOTE — Progress Notes (Signed)
[place] [facility]  Chief Complaint  Patient presents with  . Follow-up    Pt is doing well overall    Primary care: Florene Route  Vital signs: BP 128/80 (BP Location: Left Arm, Cuff Size: Normal)   Pulse 91   Ht 5' 1"  (1.549 m)   Wt 163 lb (73.9 kg)   SpO2 94%   BMI 30.80 kg/m   History of Present Illness: Gabrielle [name] is a 62 y.o. female former smoker with wheezing and nocturnal hypoxia after RULectomy in March 2951 for NSCLC complicated by steroid responsive pulmonary infiltrates.  She had CT chest earlier this month.  Mild emphysema.  No recurrence of malignancy.  She fell and tore her Rt rotator cuff.    Has some cough and chest congestion.  Using spiriva and symbicort intermittently.  Physical Exam:  General - pleasant Eyes - pupils reactive ENT - no sinus tenderness, no oral exudate, no LAN Cardiac - regular, no murmur Chest - no wheeze, rales Abd - soft, non tender Ext - no edema Skin - no rashes Neuro - normal strength Psych - normal mood   Assessment/Plan:  Persistent asthma and mild emphysema changes on CT chest. - can continue spiriva and symbicort for now on regular basis - if she is feeling better, then she can try stopping spiriva - prn albuterol, mucinex  Nocturnal hypoxemia. - continue oxygen at night   Patient Instructions  Follow up in 6 months    Chesley Mires, MD [place] 07/13/2017, 12:55 PM  Flow Sheet  Pulmonary tests: PET scan 8/84/16 >> hypermetabolic RUL nodule 1.4 x 1.3 cm PFT 04/07/14 >> FEV1 1.91 (85%), FEV1% 73, TLC 4.22 (98%), DLCO 56% Labs 05/06/15 >> CPK 59, RF < 10, ANA negative, ESR 23 PFT 05/21/15 >> FEV1 1.96 (83%), FEV1% 73, FEF 25-75% 1.56 (68%), TLC 4.46 (96%), DLCO 55%, +BD from FEF 25-75% FeNO 12/03/15 >> 5 PFT 05/16/17 >> FEV1 1.93 (84%), FEV1% 74, TLC 4.16 (90%), DLCO 71%, no BD  Sleep tests ONO with RA 07/24/14 >> test time 9 hrs 57 min.  Mean SpO2 91.8%, low SpO2 82%. Spent 1 hr 59 min with SpO2 < 88%. ONO on RA 06/01/15 >>test time 7 hrs 50 min. Mean SpO2 90.07%, low SpO2 67%. Spent 2 hrs 24 min with SpO2 <88%. She needs to continue using supplemental oxygen at night ONO with RA 05/30/17 >> test time 4 hrs 44 min.  Average SpO2 90%, low SpO2 78%.  Spent 1 hr 7 min with SpO2 < 88%.  Cardiac tests Echo 04/28/14 >> EF 55 to 60%, grade 1 diastolic dysfx  Past Medical History: She  has a past medical history of Anemia, Arthritis, Asthma, Breast cancer ([place]) (04/2012), Carpal tunnel syndrome, Chronic pain in left foot, Cough, Depression, Diarrhea, Dyspnea, GERD (gastroesophageal reflux disease), H/O eating disorder, History of anemia, Hyperlipidemia, Invasive ductal carcinoma of right breast ([place]) (08/21/2012), Legally blind, Lung cancer ([place]), Orthostatic hypotension, Pneumonia, S/P bilateral breast implants (08/21/2012), and Tobacco abuse (08/21/2012).  Past Surgical History: She  has a past surgical history that includes Breast lumpectomy with sentinel lymph node bx (Right, 04/2012); Tonsillectomy; Video bronchoscopy (N/A, 04/20/2014); Video assisted thoracoscopy (vats)/wedge resection (Right, 04/20/2014); Wedge resection (Right, 04/20/2014); Lobectomy (Right, 04/20/2014); Node dissection (Right, 04/20/2014); Breast lumpectomy (Left, 1990); Esophagogastroduodenoscopy (egd) with propofol (N/A, 05/10/2017); Breast lumpectomy (Right); Vaginal hysterectomy; Placement of breast implants (1993); Plantar fascia release (Left); and Shoulder arthroscopy with subacromial decompression, rotator cuff repair and  bicep tendon repair (Right, 06/27/2017).  Family History: Her family history includes Hypertension in her father and mother; Leukemia in her maternal grandmother; Lymphoma (age of onset: 28) in her maternal aunt.  Social History: She  reports that she quit smoking about 3 years ago. Her smoking use included cigarettes. She has a 40.00 pack-year  smoking history. She has never used smokeless tobacco. She reports that she drinks about 3.6 oz of alcohol per week. She reports that she does not use drugs.  Medications: Allergies as of 07/13/2017      Reactions   Hydrocodone Other (See Comments)   Makes her hyper      Medication List        Accurate as of 07/13/17 12:55 PM. Always use your most recent med list.          albuterol 108 (90 Base) MCG/ACT inhaler Commonly known as:  PROVENTIL HFA;VENTOLIN HFA Inhale 2 puffs into the lungs every 6 (six) hours as needed for wheezing or shortness of breath.   anastrozole 1 MG tablet Commonly known as:  ARIMIDEX Take 1 tablet (1 mg total) by mouth daily.   atorvastatin 20 MG tablet Commonly known as:  LIPITOR Take 20 mg by mouth at bedtime.   budesonide-formoterol 160-4.5 MCG/ACT inhaler Commonly known as:  SYMBICORT Inhale 2 puffs into the lungs 2 (two) times daily.   CALCIUM 600+D 600-800 MG-UNIT Tabs Generic drug:  Calcium Carb-Cholecalciferol Take 2 tablets by mouth daily.   gabapentin 800 MG tablet Commonly known as:  NEURONTIN Take 800 mg by mouth 3 (three) times daily.   hydrOXYzine 50 MG tablet Commonly known as:  ATARAX/VISTARIL Take 50 mg by mouth at bedtime.   lisinopril 20 MG tablet Commonly known as:  PRINIVIL,ZESTRIL Take 20 mg by mouth daily.   omeprazole 40 MG capsule Commonly known as:  PRILOSEC Take 40 mg by mouth daily.   SPIRIVA HANDIHALER 18 MCG inhalation capsule Generic drug:  tiotropium Place 18 mcg into inhaler and inhale daily.   sucralfate 1 g tablet Commonly known as:  CARAFATE Take 1 g by mouth 3 (three) times daily.   trazodone 300 MG tablet Commonly known as:  DESYREL Take 300 mg by mouth at bedtime.

## 2017-07-13 NOTE — Patient Instructions (Signed)
Follow up in 6 months 

## 2017-07-20 DIAGNOSIS — M75121 Complete rotator cuff tear or rupture of right shoulder, not specified as traumatic: Secondary | ICD-10-CM | POA: Diagnosis not present

## 2017-07-20 DIAGNOSIS — Z170 Estrogen receptor positive status [ER+]: Secondary | ICD-10-CM | POA: Diagnosis not present

## 2017-07-20 DIAGNOSIS — C3411 Malignant neoplasm of upper lobe, right bronchus or lung: Secondary | ICD-10-CM | POA: Diagnosis not present

## 2017-07-20 DIAGNOSIS — C50811 Malignant neoplasm of overlapping sites of right female breast: Secondary | ICD-10-CM | POA: Diagnosis not present

## 2017-07-27 DIAGNOSIS — K219 Gastro-esophageal reflux disease without esophagitis: Secondary | ICD-10-CM | POA: Diagnosis not present

## 2017-07-27 DIAGNOSIS — E782 Mixed hyperlipidemia: Secondary | ICD-10-CM | POA: Diagnosis not present

## 2017-07-27 DIAGNOSIS — E871 Hypo-osmolality and hyponatremia: Secondary | ICD-10-CM | POA: Diagnosis not present

## 2017-07-27 DIAGNOSIS — I10 Essential (primary) hypertension: Secondary | ICD-10-CM | POA: Diagnosis not present

## 2017-07-31 DIAGNOSIS — C3490 Malignant neoplasm of unspecified part of unspecified bronchus or lung: Secondary | ICD-10-CM | POA: Diagnosis not present

## 2017-08-03 DIAGNOSIS — E782 Mixed hyperlipidemia: Secondary | ICD-10-CM | POA: Diagnosis not present

## 2017-08-03 DIAGNOSIS — M75121 Complete rotator cuff tear or rupture of right shoulder, not specified as traumatic: Secondary | ICD-10-CM | POA: Diagnosis not present

## 2017-08-10 DIAGNOSIS — M25511 Pain in right shoulder: Secondary | ICD-10-CM | POA: Diagnosis not present

## 2017-08-14 DIAGNOSIS — M75101 Unspecified rotator cuff tear or rupture of right shoulder, not specified as traumatic: Secondary | ICD-10-CM | POA: Diagnosis not present

## 2017-08-14 DIAGNOSIS — M6281 Muscle weakness (generalized): Secondary | ICD-10-CM | POA: Diagnosis not present

## 2017-08-14 DIAGNOSIS — M25611 Stiffness of right shoulder, not elsewhere classified: Secondary | ICD-10-CM | POA: Diagnosis not present

## 2017-08-16 DIAGNOSIS — M25611 Stiffness of right shoulder, not elsewhere classified: Secondary | ICD-10-CM | POA: Diagnosis not present

## 2017-08-16 DIAGNOSIS — M6281 Muscle weakness (generalized): Secondary | ICD-10-CM | POA: Diagnosis not present

## 2017-08-16 DIAGNOSIS — M75101 Unspecified rotator cuff tear or rupture of right shoulder, not specified as traumatic: Secondary | ICD-10-CM | POA: Diagnosis not present

## 2017-08-17 DIAGNOSIS — M75101 Unspecified rotator cuff tear or rupture of right shoulder, not specified as traumatic: Secondary | ICD-10-CM | POA: Diagnosis not present

## 2017-08-17 DIAGNOSIS — M6281 Muscle weakness (generalized): Secondary | ICD-10-CM | POA: Diagnosis not present

## 2017-08-17 DIAGNOSIS — M25611 Stiffness of right shoulder, not elsewhere classified: Secondary | ICD-10-CM | POA: Diagnosis not present

## 2017-08-20 DIAGNOSIS — M25611 Stiffness of right shoulder, not elsewhere classified: Secondary | ICD-10-CM | POA: Diagnosis not present

## 2017-08-20 DIAGNOSIS — M6281 Muscle weakness (generalized): Secondary | ICD-10-CM | POA: Diagnosis not present

## 2017-08-20 DIAGNOSIS — M75101 Unspecified rotator cuff tear or rupture of right shoulder, not specified as traumatic: Secondary | ICD-10-CM | POA: Diagnosis not present

## 2017-08-22 DIAGNOSIS — M25611 Stiffness of right shoulder, not elsewhere classified: Secondary | ICD-10-CM | POA: Diagnosis not present

## 2017-08-22 DIAGNOSIS — M75101 Unspecified rotator cuff tear or rupture of right shoulder, not specified as traumatic: Secondary | ICD-10-CM | POA: Diagnosis not present

## 2017-08-22 DIAGNOSIS — M6281 Muscle weakness (generalized): Secondary | ICD-10-CM | POA: Diagnosis not present

## 2017-08-24 DIAGNOSIS — M6281 Muscle weakness (generalized): Secondary | ICD-10-CM | POA: Diagnosis not present

## 2017-08-24 DIAGNOSIS — M25611 Stiffness of right shoulder, not elsewhere classified: Secondary | ICD-10-CM | POA: Diagnosis not present

## 2017-08-24 DIAGNOSIS — M75101 Unspecified rotator cuff tear or rupture of right shoulder, not specified as traumatic: Secondary | ICD-10-CM | POA: Diagnosis not present

## 2017-08-27 DIAGNOSIS — M25611 Stiffness of right shoulder, not elsewhere classified: Secondary | ICD-10-CM | POA: Diagnosis not present

## 2017-08-27 DIAGNOSIS — M6281 Muscle weakness (generalized): Secondary | ICD-10-CM | POA: Diagnosis not present

## 2017-08-27 DIAGNOSIS — M75101 Unspecified rotator cuff tear or rupture of right shoulder, not specified as traumatic: Secondary | ICD-10-CM | POA: Diagnosis not present

## 2017-08-29 DIAGNOSIS — M75101 Unspecified rotator cuff tear or rupture of right shoulder, not specified as traumatic: Secondary | ICD-10-CM | POA: Diagnosis not present

## 2017-08-29 DIAGNOSIS — M6281 Muscle weakness (generalized): Secondary | ICD-10-CM | POA: Diagnosis not present

## 2017-08-29 DIAGNOSIS — M25611 Stiffness of right shoulder, not elsewhere classified: Secondary | ICD-10-CM | POA: Diagnosis not present

## 2017-08-31 DIAGNOSIS — M6281 Muscle weakness (generalized): Secondary | ICD-10-CM | POA: Diagnosis not present

## 2017-08-31 DIAGNOSIS — M75101 Unspecified rotator cuff tear or rupture of right shoulder, not specified as traumatic: Secondary | ICD-10-CM | POA: Diagnosis not present

## 2017-08-31 DIAGNOSIS — M25611 Stiffness of right shoulder, not elsewhere classified: Secondary | ICD-10-CM | POA: Diagnosis not present

## 2017-08-31 DIAGNOSIS — C3490 Malignant neoplasm of unspecified part of unspecified bronchus or lung: Secondary | ICD-10-CM | POA: Diagnosis not present

## 2017-09-03 DIAGNOSIS — M6281 Muscle weakness (generalized): Secondary | ICD-10-CM | POA: Diagnosis not present

## 2017-09-03 DIAGNOSIS — M75101 Unspecified rotator cuff tear or rupture of right shoulder, not specified as traumatic: Secondary | ICD-10-CM | POA: Diagnosis not present

## 2017-09-03 DIAGNOSIS — M25611 Stiffness of right shoulder, not elsewhere classified: Secondary | ICD-10-CM | POA: Diagnosis not present

## 2017-09-05 DIAGNOSIS — M6281 Muscle weakness (generalized): Secondary | ICD-10-CM | POA: Diagnosis not present

## 2017-09-05 DIAGNOSIS — M25611 Stiffness of right shoulder, not elsewhere classified: Secondary | ICD-10-CM | POA: Diagnosis not present

## 2017-09-05 DIAGNOSIS — M75101 Unspecified rotator cuff tear or rupture of right shoulder, not specified as traumatic: Secondary | ICD-10-CM | POA: Diagnosis not present

## 2017-09-06 DIAGNOSIS — M6281 Muscle weakness (generalized): Secondary | ICD-10-CM | POA: Diagnosis not present

## 2017-09-06 DIAGNOSIS — M25611 Stiffness of right shoulder, not elsewhere classified: Secondary | ICD-10-CM | POA: Diagnosis not present

## 2017-09-06 DIAGNOSIS — M75101 Unspecified rotator cuff tear or rupture of right shoulder, not specified as traumatic: Secondary | ICD-10-CM | POA: Diagnosis not present

## 2017-09-10 DIAGNOSIS — M75101 Unspecified rotator cuff tear or rupture of right shoulder, not specified as traumatic: Secondary | ICD-10-CM | POA: Diagnosis not present

## 2017-09-10 DIAGNOSIS — M25611 Stiffness of right shoulder, not elsewhere classified: Secondary | ICD-10-CM | POA: Diagnosis not present

## 2017-09-10 DIAGNOSIS — M6281 Muscle weakness (generalized): Secondary | ICD-10-CM | POA: Diagnosis not present

## 2017-09-12 DIAGNOSIS — M6281 Muscle weakness (generalized): Secondary | ICD-10-CM | POA: Diagnosis not present

## 2017-09-12 DIAGNOSIS — M25611 Stiffness of right shoulder, not elsewhere classified: Secondary | ICD-10-CM | POA: Diagnosis not present

## 2017-09-12 DIAGNOSIS — M75101 Unspecified rotator cuff tear or rupture of right shoulder, not specified as traumatic: Secondary | ICD-10-CM | POA: Diagnosis not present

## 2017-09-13 DIAGNOSIS — M75101 Unspecified rotator cuff tear or rupture of right shoulder, not specified as traumatic: Secondary | ICD-10-CM | POA: Diagnosis not present

## 2017-09-13 DIAGNOSIS — M25611 Stiffness of right shoulder, not elsewhere classified: Secondary | ICD-10-CM | POA: Diagnosis not present

## 2017-09-13 DIAGNOSIS — M6281 Muscle weakness (generalized): Secondary | ICD-10-CM | POA: Diagnosis not present

## 2017-09-17 DIAGNOSIS — M25611 Stiffness of right shoulder, not elsewhere classified: Secondary | ICD-10-CM | POA: Diagnosis not present

## 2017-09-17 DIAGNOSIS — M6281 Muscle weakness (generalized): Secondary | ICD-10-CM | POA: Diagnosis not present

## 2017-09-17 DIAGNOSIS — M75101 Unspecified rotator cuff tear or rupture of right shoulder, not specified as traumatic: Secondary | ICD-10-CM | POA: Diagnosis not present

## 2017-09-19 DIAGNOSIS — M75101 Unspecified rotator cuff tear or rupture of right shoulder, not specified as traumatic: Secondary | ICD-10-CM | POA: Diagnosis not present

## 2017-09-19 DIAGNOSIS — M25611 Stiffness of right shoulder, not elsewhere classified: Secondary | ICD-10-CM | POA: Diagnosis not present

## 2017-09-19 DIAGNOSIS — M6281 Muscle weakness (generalized): Secondary | ICD-10-CM | POA: Diagnosis not present

## 2017-09-21 DIAGNOSIS — M25511 Pain in right shoulder: Secondary | ICD-10-CM | POA: Diagnosis not present

## 2017-09-26 DIAGNOSIS — M75101 Unspecified rotator cuff tear or rupture of right shoulder, not specified as traumatic: Secondary | ICD-10-CM | POA: Diagnosis not present

## 2017-09-26 DIAGNOSIS — M25611 Stiffness of right shoulder, not elsewhere classified: Secondary | ICD-10-CM | POA: Diagnosis not present

## 2017-09-26 DIAGNOSIS — M6281 Muscle weakness (generalized): Secondary | ICD-10-CM | POA: Diagnosis not present

## 2017-10-01 DIAGNOSIS — C3490 Malignant neoplasm of unspecified part of unspecified bronchus or lung: Secondary | ICD-10-CM | POA: Diagnosis not present

## 2017-10-16 ENCOUNTER — Encounter: Payer: Self-pay | Admitting: Nurse Practitioner

## 2017-10-16 ENCOUNTER — Other Ambulatory Visit (INDEPENDENT_AMBULATORY_CARE_PROVIDER_SITE_OTHER): Payer: BLUE CROSS/BLUE SHIELD

## 2017-10-16 ENCOUNTER — Ambulatory Visit: Payer: BLUE CROSS/BLUE SHIELD | Admitting: Nurse Practitioner

## 2017-10-16 VITALS — BP 128/74 | HR 68 | Ht 61.0 in | Wt 166.8 lb

## 2017-10-16 DIAGNOSIS — R634 Abnormal weight loss: Secondary | ICD-10-CM

## 2017-10-16 DIAGNOSIS — R1010 Upper abdominal pain, unspecified: Secondary | ICD-10-CM

## 2017-10-16 DIAGNOSIS — R197 Diarrhea, unspecified: Secondary | ICD-10-CM

## 2017-10-16 LAB — COMPREHENSIVE METABOLIC PANEL
ALBUMIN: 4.2 g/dL (ref 3.5–5.2)
ALK PHOS: 84 U/L (ref 39–117)
ALT: 31 U/L (ref 0–35)
AST: 31 U/L (ref 0–37)
BILIRUBIN TOTAL: 0.4 mg/dL (ref 0.2–1.2)
BUN: 9 mg/dL (ref 6–23)
CO2: 28 mEq/L (ref 19–32)
CREATININE: 0.63 mg/dL (ref 0.40–1.20)
Calcium: 9.3 mg/dL (ref 8.4–10.5)
Chloride: 99 mEq/L (ref 96–112)
GFR: 102.46 mL/min (ref >60.00)
GLUCOSE: 88 mg/dL (ref 70–99)
Potassium: 4 mEq/L (ref 3.5–5.1)
Sodium: 136 mEq/L (ref 135–145)
Total Protein: 6.5 g/dL (ref 6.0–8.3)

## 2017-10-16 LAB — CBC WITH DIFFERENTIAL/PLATELET
BASOS ABS: 0.1 10*3/uL (ref 0.0–0.1)
Basophils Relative: 0.8 % (ref 0.0–3.0)
EOS ABS: 0.2 10*3/uL (ref 0.0–0.7)
Eosinophils Relative: 3 % (ref 0.0–5.0)
HCT: 35.4 % — ABNORMAL LOW (ref 36.0–46.0)
Hemoglobin: 12.2 g/dL (ref 12.0–15.0)
LYMPHS ABS: 2 10*3/uL (ref 0.7–4.0)
Lymphocytes Relative: 28 % (ref 12.0–46.0)
MCHC: 34.4 g/dL (ref 30.0–36.0)
MCV: 97.6 fl (ref 78.0–100.0)
MONO ABS: 0.5 10*3/uL (ref 0.1–1.0)
Monocytes Relative: 7.5 % (ref 3.0–12.0)
Neutro Abs: 4.4 10*3/uL (ref 1.4–7.7)
Neutrophils Relative %: 60.7 % (ref 43.0–77.0)
Platelets: 268 10*3/uL (ref 150.0–400.0)
RBC: 3.63 Mil/uL — AB (ref 3.87–5.11)
RDW: 13.8 % (ref 11.5–15.5)
WBC: 7.2 10*3/uL (ref 4.0–10.5)

## 2017-10-16 LAB — LIPASE: Lipase: 23 U/L (ref 11.0–59.0)

## 2017-10-16 NOTE — Progress Notes (Signed)
Primary GI:  Oretha Caprice, MD   Chief Complaint:   Bowel changes   IMPRESSION and PLAN:     30.  62 year old female with 19-month history of bowel changes with intermittent, non-bloody, sometimes "foamy" diarrhea unrelated to any medication or dietary changes.  Some weeks she has normal bowel movements so I doubt this is infectious but certainly need to exclude.  -Check stool for C. difficile, lactoferrin -No recent labs.  Will check Cmet, CBC  -If stool studies negative then consider colonoscopy for persistent bowel changes.  I requested colonoscopy report from Dr. Anthony Sar in [place] when I saw patient a few months ago.  I do not see those results so I will re-request  2.  Diffuse upper abdominal pain, nausea. Started around the time of bowel changes 3 months ago though sh has pain even on weeks that her bowels are normal.  Fatty foods seems to exacerbate the pain.  -She had mild gastritis on EGD in April of this year.  Even though her current symptoms are different I think a repeat EGD would be low yield at this point -Pain not specific to RUQ / epigastrium but given correlation of pain with fatty foods will obtain abdominal ultrasound -Await CMET, CBC. Will add lipase as well.   3.  GERD, asymptomatic on daily PPI  4. Hx of breast cancer 2014, s/p lumpectomy, sentinel node biopsy, radiation and Arimidex. No recurrence on mammogram 2018.   5. Hx of stage Ia (T1b N0 M0) adenocarcinoma of the right lung s/p right upper lobectomy on 2016.  CT chest from June 2019 negative for recurrence   HPI:     Patient is a 62 year old female bipolar depression, COPD requiring 02 at night, right breast cancer (no recurrence), right lung cancer, and long-standing GERD.  I saw her April of this year for evaluation of odynophagia and dysphagia.  She had steroid exposure so I treated her empirically with Diflucan.  Following that she underwent EGD with findings of only mild gastritis.  Esophageal biopsy  showed mild reflux changes.   Susan is here with a 88-month history of bowel changes.  She is having diarrhea which waxes and wanes.  She may have a week worth of diarrhea consisting of up to 7 loose and sometimes foamy stools a day.   No nocturnal diarrhea.. Stools are nonbloody, nongreasy.  Some weeks she has normal bowel habits with 2 formed stools a day.  She cannot correlate intermittent bowel changes with any dietary changes.  Bowel changes unrelated to wheat nor diary. She has not had any medication changes or changed anything else she can think of.   Kimbra also complains of diffuse upper abdominal discomfort often radiating around to her back in a bandlike fashion.  She feels like the pain is exacerbated by fatty meals.  The pain is achy, gripping, unrelieved with bowel movements.  She has the diffuse upper abdominal pain even on weeks when stools are normal. She complains of feeling queasy quite often and she has early satiety.  She lost 10 pounds between late April and June but weight stable now at 166 pounds. Average Etoh intake less than 3 drinks a week  Last colonoscopy was by Dr. Lurlean Nanny [facility]  No recent labs.  In early June serum chemistries and CBC unremarkable  Review of systems:     No chest pain, no SOB, no fevers, no urinary sx   Past Medical History:  Diagnosis Date  . Anemia   .  Arthritis   . Asthma   . Breast cancer ([place]) 04/2012   right  . Carpal tunnel syndrome    bil  . Chronic pain in left foot   . Cough   . Depression    espisodic  . Diarrhea   . Dyspnea    with exertion an have failed over night oximenrty - wears oxygen at hs  . GERD (gastroesophageal reflux disease)   . H/O eating disorder   . History of anemia   . Hyperlipidemia   . Invasive ductal carcinoma of right breast ([place]) 08/21/2012  . Legally blind   . Lung cancer ([place])    right  . Orthostatic hypotension   . Pneumonia    03-13-17  . S/P bilateral breast implants 08/21/2012   Prior to cancer  diagnosis  . Tobacco abuse 08/21/2012   > 40 pack years    Patient's surgical history, family medical history, social history, medications and allergies were all reviewed in Epic   Creatinine clearance cannot be calculated (Patient's most recent lab result is older than the maximum 21 days allowed.)  Current Outpatient Medications  Medication Sig Dispense Refill  . acetaminophen (TYLENOL) 500 MG tablet Take 500 mg by mouth every 8 (eight) hours as needed.    Marland Kitchen albuterol (PROVENTIL HFA;VENTOLIN HFA) 108 (90 Base) MCG/ACT inhaler Inhale 2 puffs into the lungs every 6 (six) hours as needed for wheezing or shortness of breath.    Marland Kitchen atorvastatin (LIPITOR) 20 MG tablet Take 20 mg by mouth at bedtime.     . budesonide-formoterol (SYMBICORT) 160-4.5 MCG/ACT inhaler Inhale 2 puffs into the lungs 2 (two) times daily.    . Calcium Carb-Cholecalciferol (CALCIUM 600+D) 600-800 MG-UNIT TABS Take 2 tablets by mouth daily.    Marland Kitchen gabapentin (NEURONTIN) 800 MG tablet Take 800 mg by mouth 3 (three) times daily.     . hydrOXYzine (ATARAX/VISTARIL) 50 MG tablet Take 50 mg by mouth at bedtime.    Marland Kitchen lisinopril (PRINIVIL,ZESTRIL) 20 MG tablet Take 20 mg by mouth daily.    Marland Kitchen omeprazole (PRILOSEC) 40 MG capsule Take 40 mg by mouth daily.     . sucralfate (CARAFATE) 1 g tablet Take 1 g by mouth 3 (three) times daily.    Marland Kitchen tiotropium (SPIRIVA HANDIHALER) 18 MCG inhalation capsule Place 18 mcg into inhaler and inhale daily.    . trazodone (DESYREL) 300 MG tablet Take 300 mg by mouth at bedtime.      No current facility-administered medications for this visit.     Physical Exam:     BP 128/74   Pulse 68   Ht 5\' 1"  (1.549 m)   Wt 166 lb 12.8 oz (75.7 kg)   BMI 31.52 kg/m   GENERAL:  Pleasant female in NAD PSYCH: : Cooperative, normal affect EENT:  conjunctiva pink, mucous membranes moist, neck supple without masses CARDIAC:  RRR, no murmur heard, no peripheral edema PULM: Normal respiratory effort, lungs CTA  bilaterally, no wheezing ABDOMEN:  Nondistended, soft, nontender. No obvious masses, no hepatomegaly,  normal bowel sounds SKIN:  turgor, no lesions seen Musculoskeletal:  Normal muscle tone, normal strength NEURO: Alert and oriented x 3, no focal neurologic deficits   Tye Savoy , NP 10/16/2017, 9:17 AM

## 2017-10-16 NOTE — Patient Instructions (Signed)
Your provider has requested that you go to the basement level for lab work before leaving today. Press "B" on the elevator. The lab is located at the first door on the left as you exit the elevator.   You have been scheduled for an abdominal ultrasound at [facility] (1st floor of hospital) on 10/18/17 at 9 am. Please arrive 15 minutes prior to your appointment for registration. Make certain not to have anything to eat or drink after midnight the day  prior to your appointment. Should you need to reschedule your appointment, please contact radiology at [phone]. This test typically takes about 30 minutes to perform.  If you are age 32 or older, your body mass index should be between 23-30. Your Body mass index is 31.52 kg/m. If this is out of the aforementioned range listed, please consider follow up with your Primary Care Provider.  If you are age 19 or younger, your body mass index should be between 19-25. Your Body mass index is 31.52 kg/m. If this is out of the aformentioned range listed, please consider follow up with your Primary Care Provider.   Thank you for choosing me and [facility].   Tye Savoy, NP

## 2017-10-18 ENCOUNTER — Ambulatory Visit (HOSPITAL_COMMUNITY)
Admission: RE | Admit: 2017-10-18 | Discharge: 2017-10-18 | Disposition: A | Discharge status: Home / Self Care | Payer: BLUE CROSS/BLUE SHIELD | Source: Ambulatory Visit | Attending: Nurse Practitioner | Admitting: Nurse Practitioner

## 2017-10-18 DIAGNOSIS — R1010 Upper abdominal pain, unspecified: Secondary | ICD-10-CM | POA: Diagnosis not present

## 2017-10-18 DIAGNOSIS — N281 Cyst of kidney, acquired: Secondary | ICD-10-CM | POA: Diagnosis not present

## 2017-10-28 NOTE — Progress Notes (Signed)
Reviewed and agree with documentation and assessment and plan. K. Veena Nandigam , MD   

## 2017-10-31 DIAGNOSIS — C3490 Malignant neoplasm of unspecified part of unspecified bronchus or lung: Secondary | ICD-10-CM | POA: Diagnosis not present

## 2017-11-09 DIAGNOSIS — M25511 Pain in right shoulder: Secondary | ICD-10-CM | POA: Diagnosis not present

## 2017-11-21 ENCOUNTER — Telehealth: Payer: Self-pay | Admitting: Gastroenterology

## 2017-11-21 NOTE — Telephone Encounter (Signed)
Pt had previous colon with Dr. Cornelia Copa in [place], states she is due for colon and wants to schedule it before the end of the year. States she has filled out 2 release forms for our office to get the colon report. Patty do you know anything about this? Let pt know we will call her back.

## 2017-11-22 NOTE — Telephone Encounter (Signed)
Addendum 06/26/2017.  Records received from [facility]. Screening colonoscopy 08/16/2012.  Extent of exam to the cecum, quality the prep was good.  Normal exam no polyps  I will put her on colonoscopy recall list for July 2024   Recall entered for 07/24/2022 with Dr Ardis Hughs.  Pt notified

## 2017-11-22 NOTE — Telephone Encounter (Signed)
Nevin Bloodgood and [place] I am not sure about this, can you look at it?

## 2017-11-22 NOTE — Telephone Encounter (Signed)
Patient calling to speak with nurse about this. Patient states she thinks she is due now.

## 2017-11-22 NOTE — Telephone Encounter (Signed)
Dr Ardis Hughs the pt states her brother had adenomatous polyps on recent colonoscopy and wants to know if she should have repeat colon now or in 10 years per recall.  Please advise

## 2017-11-23 NOTE — Telephone Encounter (Signed)
Left message on machine to call back  

## 2017-11-23 NOTE — Telephone Encounter (Signed)
Generally I still recommend colonoscopy at 10 year interval given her own colonoscopy results, no FH of actual colon cancer.  Happy to talk with her about it in the office if she has any questions or concerns.

## 2017-11-23 NOTE — Telephone Encounter (Signed)
The patient has been notified of this information and all questions answered.

## 2017-12-01 DIAGNOSIS — C3490 Malignant neoplasm of unspecified part of unspecified bronchus or lung: Secondary | ICD-10-CM | POA: Diagnosis not present

## 2017-12-05 DIAGNOSIS — N6311 Unspecified lump in the right breast, upper outer quadrant: Secondary | ICD-10-CM | POA: Diagnosis not present

## 2017-12-05 DIAGNOSIS — C50811 Malignant neoplasm of overlapping sites of right female breast: Secondary | ICD-10-CM | POA: Diagnosis not present

## 2017-12-05 DIAGNOSIS — N6312 Unspecified lump in the right breast, upper inner quadrant: Secondary | ICD-10-CM | POA: Diagnosis not present

## 2017-12-05 DIAGNOSIS — Z9889 Other specified postprocedural states: Secondary | ICD-10-CM | POA: Diagnosis not present

## 2017-12-05 DIAGNOSIS — R928 Other abnormal and inconclusive findings on diagnostic imaging of breast: Secondary | ICD-10-CM | POA: Diagnosis not present

## 2017-12-13 DIAGNOSIS — C3410 Malignant neoplasm of upper lobe, unspecified bronchus or lung: Secondary | ICD-10-CM | POA: Diagnosis not present

## 2017-12-13 DIAGNOSIS — C50911 Malignant neoplasm of unspecified site of right female breast: Secondary | ICD-10-CM | POA: Diagnosis not present

## 2017-12-13 DIAGNOSIS — Z6830 Body mass index (BMI) 30.0-30.9, adult: Secondary | ICD-10-CM | POA: Diagnosis not present

## 2017-12-19 DIAGNOSIS — Z6831 Body mass index (BMI) 31.0-31.9, adult: Secondary | ICD-10-CM | POA: Diagnosis not present

## 2017-12-19 DIAGNOSIS — K921 Melena: Secondary | ICD-10-CM | POA: Diagnosis not present

## 2017-12-19 DIAGNOSIS — Z8371 Family history of colonic polyps: Secondary | ICD-10-CM | POA: Diagnosis not present

## 2017-12-28 DIAGNOSIS — R195 Other fecal abnormalities: Secondary | ICD-10-CM | POA: Diagnosis not present

## 2017-12-28 DIAGNOSIS — Z7951 Long term (current) use of inhaled steroids: Secondary | ICD-10-CM | POA: Diagnosis not present

## 2017-12-28 DIAGNOSIS — Z853 Personal history of malignant neoplasm of breast: Secondary | ICD-10-CM | POA: Diagnosis not present

## 2017-12-28 DIAGNOSIS — M1990 Unspecified osteoarthritis, unspecified site: Secondary | ICD-10-CM | POA: Diagnosis not present

## 2017-12-28 DIAGNOSIS — I10 Essential (primary) hypertension: Secondary | ICD-10-CM | POA: Diagnosis not present

## 2017-12-28 DIAGNOSIS — J439 Emphysema, unspecified: Secondary | ICD-10-CM | POA: Diagnosis not present

## 2017-12-28 DIAGNOSIS — K219 Gastro-esophageal reflux disease without esophagitis: Secondary | ICD-10-CM | POA: Diagnosis not present

## 2017-12-28 DIAGNOSIS — Z6831 Body mass index (BMI) 31.0-31.9, adult: Secondary | ICD-10-CM | POA: Diagnosis not present

## 2017-12-28 DIAGNOSIS — Z85118 Personal history of other malignant neoplasm of bronchus and lung: Secondary | ICD-10-CM | POA: Diagnosis not present

## 2017-12-28 DIAGNOSIS — Z79899 Other long term (current) drug therapy: Secondary | ICD-10-CM | POA: Diagnosis not present

## 2017-12-28 DIAGNOSIS — K625 Hemorrhage of anus and rectum: Secondary | ICD-10-CM | POA: Diagnosis not present

## 2017-12-28 DIAGNOSIS — K641 Second degree hemorrhoids: Secondary | ICD-10-CM | POA: Diagnosis not present

## 2017-12-28 DIAGNOSIS — E785 Hyperlipidemia, unspecified: Secondary | ICD-10-CM | POA: Diagnosis not present

## 2017-12-28 DIAGNOSIS — Z8371 Family history of colonic polyps: Secondary | ICD-10-CM | POA: Diagnosis not present

## 2017-12-28 DIAGNOSIS — F329 Major depressive disorder, single episode, unspecified: Secondary | ICD-10-CM | POA: Diagnosis not present

## 2017-12-28 DIAGNOSIS — G629 Polyneuropathy, unspecified: Secondary | ICD-10-CM | POA: Diagnosis not present

## 2017-12-28 DIAGNOSIS — E669 Obesity, unspecified: Secondary | ICD-10-CM | POA: Diagnosis not present

## 2017-12-28 DIAGNOSIS — K921 Melena: Secondary | ICD-10-CM | POA: Diagnosis not present

## 2017-12-31 DIAGNOSIS — C3490 Malignant neoplasm of unspecified part of unspecified bronchus or lung: Secondary | ICD-10-CM | POA: Diagnosis not present

## 2018-01-17 DIAGNOSIS — C50911 Malignant neoplasm of unspecified site of right female breast: Secondary | ICD-10-CM | POA: Diagnosis not present

## 2018-01-17 DIAGNOSIS — M85852 Other specified disorders of bone density and structure, left thigh: Secondary | ICD-10-CM | POA: Diagnosis not present

## 2018-02-08 DIAGNOSIS — E782 Mixed hyperlipidemia: Secondary | ICD-10-CM | POA: Diagnosis not present

## 2018-02-08 DIAGNOSIS — I10 Essential (primary) hypertension: Secondary | ICD-10-CM | POA: Diagnosis not present

## 2018-02-08 DIAGNOSIS — J441 Chronic obstructive pulmonary disease with (acute) exacerbation: Secondary | ICD-10-CM | POA: Diagnosis not present

## 2018-02-08 DIAGNOSIS — R5383 Other fatigue: Secondary | ICD-10-CM | POA: Diagnosis not present

## 2018-02-08 DIAGNOSIS — E871 Hypo-osmolality and hyponatremia: Secondary | ICD-10-CM | POA: Diagnosis not present

## 2018-02-11 DIAGNOSIS — G4700 Insomnia, unspecified: Secondary | ICD-10-CM | POA: Diagnosis not present

## 2018-02-11 DIAGNOSIS — R319 Hematuria, unspecified: Secondary | ICD-10-CM | POA: Diagnosis not present

## 2018-02-11 DIAGNOSIS — E871 Hypo-osmolality and hyponatremia: Secondary | ICD-10-CM | POA: Diagnosis not present

## 2018-02-11 DIAGNOSIS — E782 Mixed hyperlipidemia: Secondary | ICD-10-CM | POA: Diagnosis not present

## 2018-02-11 DIAGNOSIS — K219 Gastro-esophageal reflux disease without esophagitis: Secondary | ICD-10-CM | POA: Diagnosis not present

## 2018-04-22 ENCOUNTER — Telehealth: Payer: Self-pay | Admitting: Pulmonary Disease

## 2018-04-22 NOTE — Telephone Encounter (Signed)
lmtcb x1 pt 

## 2018-04-23 MED ORDER — ALBUTEROL SULFATE HFA 108 (90 BASE) MCG/ACT IN AERS: 2.0000 | INHALATION_SPRAY | Freq: Four times a day (QID) | RESPIRATORY_TRACT | 3 refills | Status: DC | PRN

## 2018-04-23 MED ORDER — BUDESONIDE-FORMOTEROL FUMARATE 160-4.5 MCG/ACT IN AERO: 2.0000 | INHALATION_SPRAY | Freq: Two times a day (BID) | RESPIRATORY_TRACT | 3 refills | Status: DC

## 2018-04-23 NOTE — Telephone Encounter (Signed)
Pt is returning call. Cb is [phone].

## 2018-04-23 NOTE — Telephone Encounter (Signed)
Called and spoke with patient refills have been sent. Nothing further needed.

## 2018-04-23 NOTE — Telephone Encounter (Signed)
Unable to reach LMTCB 

## 2018-04-30 ENCOUNTER — Telehealth: Payer: Self-pay | Admitting: Pulmonary Disease

## 2018-04-30 ENCOUNTER — Encounter: Payer: Self-pay | Admitting: Pulmonary Disease

## 2018-04-30 ENCOUNTER — Ambulatory Visit (INDEPENDENT_AMBULATORY_CARE_PROVIDER_SITE_OTHER): Payer: BLUE CROSS/BLUE SHIELD | Admitting: Pulmonary Disease

## 2018-04-30 ENCOUNTER — Other Ambulatory Visit: Payer: Self-pay

## 2018-04-30 VITALS — HR 92 | Temp 99.4°F

## 2018-04-30 DIAGNOSIS — J209 Acute bronchitis, unspecified: Secondary | ICD-10-CM | POA: Diagnosis not present

## 2018-04-30 DIAGNOSIS — J4520 Mild intermittent asthma, uncomplicated: Secondary | ICD-10-CM | POA: Diagnosis not present

## 2018-04-30 DIAGNOSIS — J45909 Unspecified asthma, uncomplicated: Secondary | ICD-10-CM

## 2018-04-30 MED ORDER — BUDESONIDE-FORMOTEROL FUMARATE 160-4.5 MCG/ACT IN AERO: 2.0000 | INHALATION_SPRAY | Freq: Two times a day (BID) | RESPIRATORY_TRACT | 3 refills | Status: DC

## 2018-04-30 MED ORDER — BENZONATATE 200 MG PO CAPS: 200.0000 mg | ORAL_CAPSULE | Freq: Three times a day (TID) | ORAL | 1 refills | Status: DC | PRN

## 2018-04-30 MED ORDER — ALBUTEROL SULFATE (2.5 MG/3ML) 0.083% IN NEBU: 2.5000 mg | INHALATION_SOLUTION | Freq: Four times a day (QID) | RESPIRATORY_TRACT | 12 refills | Status: DC | PRN

## 2018-04-30 MED ORDER — DOXYCYCLINE HYCLATE 100 MG PO TABS
100.0000 mg | ORAL_TABLET | Freq: Two times a day (BID) | ORAL | 0 refills | Status: DC
Start: 2018-04-30 — End: 2018-11-25

## 2018-04-30 MED ORDER — PREDNISONE 10 MG PO TABS: ORAL_TABLET | ORAL | 0 refills | Status: DC

## 2018-04-30 NOTE — Telephone Encounter (Signed)
ATC pt, no answer. Left message for pt to call back.  

## 2018-04-30 NOTE — Telephone Encounter (Signed)
Please discuss televisit with Warner Mccreedy NP

## 2018-04-30 NOTE — Progress Notes (Signed)
Virtual Visit via Telephone Note  I connected with Gabrielle [name] on 04/30/18 at  3:00 PM EDT by telephone and verified that I am speaking with the correct person using two identifiers.   I discussed the limitations, risks, security and privacy concerns of performing an evaluation and management service by telephone and the availability of in person appointments. I also discussed with the patient that there may be a patient responsible charge related to this service. The patient expressed understanding and agreed to proceed.   History of Present Illness: 63 y.o. female former smoker with wheezing and nocturnal hypoxia after RULectomy in March 3244 for NSCLC complicated by steroid responsive pulmonary infiltrates. Maintenance: Symbicort 160 Patient of: Dr. Halford Chessman  Patient consented to consult via telephone: yes  People present and their role in pt care: pt  Chief complaint: Cough / Asthma  63 year old female reporting that for the last 2 weeks she is had increased cough with sputum production.  She reports that her productive cough brings up thick white phlegm.  She reports this is more phlegm than usual.  She reports she is been using over-the-counter cough medicines for the last 3 days to try to help suppress the cough.  She has been adherent to her Symbicort 160.  Patient is also started Qvar 80 in order to help with wheezing.  Patient also has been using her rescue inhaler 1 time daily.  Patient reports that she is afebrile.  When I had the patient checked her temperature and other vital signs today she actually had a low-grade temp of 99 4.  See vital signs listed below.  MMRC - Breathlessness Score 2 - on level ground, I walk slower than people of the same age because of breathlessness, or have to stop for breathe when walking to my own pace     Observations/Objective:  Today's Vitals   04/30/18 1455  Pulse: 92  Temp: 99.4 F (37.4 C)  TempSrc: Oral  SpO2: 98%   There is no  height or weight on file to calculate BMI.  04/29/2018 - temperature - 97.8  Pulmonary tests: PET scan 0/10/27 >> hypermetabolic RUL nodule 1.4 x 1.3 cm PFT 04/07/14 >> FEV1 1.91 (85%), FEV1% 73, TLC 4.22 (98%), DLCO 56% Labs 05/06/15 >> CPK 59, RF < 10, ANA negative, ESR 23 PFT 05/21/15 >> FEV1 1.96 (83%), FEV1% 73, FEF 25-75% 1.56 (68%), TLC 4.46 (96%), DLCO 55%, +BD from FEF 25-75% FeNO 12/03/15 >> 5 PFT 05/16/17 >> FEV1 1.93 (84%), FEV1% 74, TLC 4.16 (90%), DLCO 71%, no BD  Sleep tests ONO with RA 07/24/14 >>test time 9 hrs 57 min. Mean SpO2 91.8%, low SpO2 82%. Spent 1 hr 59 min with SpO2 <88%. ONO on RA 06/01/15 >>test time 7 hrs 50 min. Mean SpO2 90.07%, low SpO2 67%. Spent 2 hrs 24 min with SpO2 <88%. She needs to continue using supplemental oxygen at night ONO with RA 05/30/17 >>test time 4 hrs 44 min. Average SpO2 90%, low SpO2 78%. Spent 1 hr 7 min with SpO2 <88%.  Cardiac tests Echo 04/28/14 >> EF 55 to 25%, grade 1 diastolic dysfx  Lab Results  Component Value Date   NITRICOXIDE <5 12/03/2015   Assessment and Plan:  Bronchitis with asthma, acute Assessment: Cough for the last 2 weeks with increased sputum production Persistent complaint of cough, patient has been using over-the-counter measures to suppress the cough Low-grade temperature per patient today of 99 4 Patient remains adherent to Symbicort 160 Patient is also  started Qvar 80 and is still having occasional wheezing despite double ICS treatment  Plan: Doxycycline today Prednisone today Tessalon Perles for suppression of cough Continue rescue inhaler as needed Refilled albuterol nebulized medications Continue Symbicort 160 Continue to monitor symptoms as well as temperature daily 4-week telephonic follow-up with Wyn Quaker, FNP  Asthma Assessment: Occasional wheezing increased cough for the last 2 weeks Patient reports adherence to Symbicort 160 Patient started taking Qvar in addition to  Symbicort 160 for management of wheezing Patient using rescue therapy 1 time daily  Plan: Potential asthma exacerbation in the setting of acute bronchitis  Doxycycline Prednisone taper Monitor temperature as well as symptoms over the next 2 weeks Continue Symbicort 160 Okay to stop Qvar 4-week follow-up with our office  Checked [place] PMP aware patient with elevated overdose risk score.  Patient reports to me today that 30 years ago she struggled with opioid dependency.  We will hold off on narcotic cough syrup at this time.  Patient has an allergy to hydrocodone that makes her hyper.  Patient reports that she would not be able to take hydrocodone cough medicine at night because it would keep her awake.  This is counterproductive.  We will try Tessalon Perles at this time.  Follow Up Instructions:  Return in about 4 weeks (around 05/28/2018), or if symptoms worsen or fail to improve, for Follow up with Wyn Quaker FNP-C.    I discussed the assessment and treatment plan with the patient. The patient was provided an opportunity to ask questions and all were answered. The patient agreed with the plan and demonstrated an understanding of the instructions.   The patient was advised to call back or seek an in-person evaluation if the symptoms worsen or if the condition fails to improve as anticipated.  I provided 23 minutes of non-face-to-face time during this encounter.   Lauraine Rinne, NP

## 2018-04-30 NOTE — Assessment & Plan Note (Signed)
Assessment: Occasional wheezing increased cough for the last 2 weeks Patient reports adherence to Symbicort 160 Patient started taking Qvar in addition to Symbicort 160 for management of wheezing Patient using rescue therapy 1 time daily  Plan: Potential asthma exacerbation in the setting of acute bronchitis  Doxycycline Prednisone taper Monitor temperature as well as symptoms over the next 2 weeks Continue Symbicort 160 Okay to stop Qvar 4-week follow-up with our office  Checked [place] PMP aware patient with elevated overdose risk score.  Patient reports to me today that 30 years ago she struggled with opioid dependency.  We will hold off on narcotic cough syrup at this time.  Patient has an allergy to hydrocodone that makes her hyper.  Patient reports that she would not be able to take hydrocodone cough medicine at night because it would keep her awake.  This is counterproductive.  We will try Tessalon Perles at this time.

## 2018-04-30 NOTE — Telephone Encounter (Signed)
Primary Pulmonologist: [facility] Last office visit and with whom: 04/22/18 What do we see them for (pulmonary problems): Pneumonia, respiratory fauilure  Reason for call:   In the last month, have you been in contact with someone who was confirmed or suspected to have [place]avirus / COVID-19?  no  Do you have any of the following symptoms developed in the last 30 days? Fever: no  Cough: Yes coughing up white thick mucus  Shortness of breath: No  When did your symptoms start? 2 weeks ago  If the patient has a fever, what is the last reading?  (use n/a if patient denies fever)  No  . IF THE PATIENT STATES THEY DO NOT OWN A THERMOMETER, THEY MUST GO AND PURCHASE ONE When did the fever start?: N/a Have you taken any medication to suppress a fever (ie Ibuprofen, Aleve, Tylenol)?: no  TRIAGE :: REMIND PATIENT TO SELF-ISOLATE WHILE THEY'RE MESSAGE IS BEING HANDLED   Patient as taking robitussin with no relief.  TP please advise  .

## 2018-04-30 NOTE — Patient Instructions (Addendum)
Doxycycline >>> 1 100 mg tablet every 12 hours for 7 days >>>take with food  >>>wear sunscreen   Prednisone 10mg  tablet  >>>4 tabs for 2 days, then 3 tabs for 2 days, 2 tabs for 2 days, then 1 tab for 2 days, then stop >>>take with food  >>>take in the morning   Only use your albuterol as a rescue medication to be used if you can't catch your breath by resting or doing a relaxed purse lip breathing pattern.  - The less you use it, the better it will work when you need it. - Ok to use up to 2 puffs  every 4 hours if you must but call for immediate appointment if use goes up over your usual need - Don't leave home without it !!  (think of it like the spare tire for your car)   Refilled albuterol nebulized medication Can use every 6-8 hours as needed for shortness of breath and wheezing in place of rescue inhaler  Tessalon Perles 200 mg >>>Patient can take 1 every 8 hours as needed for cough  Can continue to take over-the-counter cough medication at this time  Continue Symbicort 160 >>> 2 puffs in the morning right when you wake up, rinse out your mouth after use, 12 hours later 2 puffs, rinse after use >>> Take this daily, no matter what >>> This is not a rescue inhaler   Please stop taking Qvar  Return in about 4 weeks (around 05/28/2018), or if symptoms worsen or fail to improve, for Follow up with Wyn Quaker FNP-C.      Coronavirus (COVID-19) Are you at risk?  Are you at risk for the Coronavirus (COVID-19)?  To be considered HIGH RISK for Coronavirus (COVID-19), you have to meet the following criteria:  . Traveled to [place], [place], [place], [place] or [place]; or in the [place] to [place], [place], [place], or [place]; and have fever, cough, and shortness of breath within the last 2 weeks of travel OR . Been in close contact with a person diagnosed with COVID-19 within the last 2 weeks and have fever, cough, and shortness of breath . IF YOU DO NOT MEET THESE  CRITERIA, YOU ARE CONSIDERED LOW RISK FOR COVID-19.  What to do if you are HIGH RISK for COVID-19?  Marland Kitchen If you are having a medical emergency, call 911. . Seek medical care right away. Before you go to a doctor's office, urgent care or emergency department, call ahead and tell them about your recent travel, contact with someone diagnosed with COVID-19, and your symptoms. You should receive instructions from your physician's office regarding next steps of care.  . When you arrive at healthcare provider, tell the healthcare staff immediately you have returned from visiting [place], [place], [place], [place] or [place]; or traveled in the [place] to [place], [place], [place], or [place]; in the last two weeks or you have been in close contact with a person diagnosed with COVID-19 in the last 2 weeks.   . Tell the health care staff about your symptoms: fever, cough and shortness of breath. . After you have been seen by a medical provider, you will be either: o Tested for (COVID-19) and discharged home on quarantine except to seek medical care if symptoms worsen, and asked to  - Stay home and avoid contact with others until you get your results (4-5 days)  - Avoid travel on public transportation if possible (such as bus,  train, or airplane) or o Sent to the Emergency Department by EMS for evaluation, COVID-19 testing, and possible admission depending on your condition and test results.  What to do if you are LOW RISK for COVID-19?  Reduce your risk of any infection by using the same precautions used for avoiding the common cold or flu:  Marland Kitchen Wash your hands often with soap and warm water for at least 20 seconds.  If soap and water are not readily available, use an alcohol-based hand sanitizer with at least 60% alcohol.  . If coughing or sneezing, cover your mouth and nose by coughing or sneezing into the elbow areas of your shirt or coat, into a tissue or into your sleeve (not your  hands). . Avoid shaking hands with others and consider head nods or verbal greetings only. . Avoid touching your eyes, nose, or mouth with unwashed hands.  . Avoid close contact with people who are sick. . Avoid places or events with large numbers of people in one location, like concerts or sporting events. . Carefully consider travel plans you have or are making. . If you are planning any travel outside or inside the [place], visit the CDC's Travelers' Health webpage for the latest health notices. . If you have some symptoms but not all symptoms, continue to monitor at home and seek medical attention if your symptoms worsen. . If you are having a medical emergency, call 911.   [place] / e-Visit: [email or web address]         [facility]: [facility]: [phone]                   [facility]: [phone]           It is flu season:   >>> Best ways to protect herself from the flu: Receive the yearly flu vaccine, practice good hand hygiene washing with soap and also using hand sanitizer when available, eat a nutritious meals, get adequate rest, hydrate appropriately   Please contact the office if your symptoms worsen or you have concerns that you are not improving.   Thank you for choosing [facility] for your healthcare, and for allowing [place] to partner with you on your healthcare journey. I am thankful to be able to provide care to you today.   Wyn Quaker FNP-C

## 2018-04-30 NOTE — Telephone Encounter (Signed)
Patient aware visit scheduled.

## 2018-04-30 NOTE — Assessment & Plan Note (Signed)
Assessment: Cough for the last 2 weeks with increased sputum production Persistent complaint of cough, patient has been using over-the-counter measures to suppress the cough Low-grade temperature per patient today of 99 4 Patient remains adherent to Symbicort 160 Patient is also started Qvar 80 and is still having occasional wheezing despite double ICS treatment  Plan: Doxycycline today Prednisone today Tessalon Perles for suppression of cough Continue rescue inhaler as needed Refilled albuterol nebulized medications Continue Symbicort 160 Continue to monitor symptoms as well as temperature daily 4-week telephonic follow-up with Wyn Quaker, FNP

## 2018-05-27 NOTE — Progress Notes (Signed)
Virtual Visit via Telephone Note  I connected with Gabrielle [name] on 05/28/18 at  2:30 PM EDT by telephone and verified that I am speaking with the correct person using two identifiers.   I discussed the limitations, risks, security and privacy concerns of performing an evaluation and management service by telephone and the availability of in person appointments. I also discussed with the patient that there may be a patient responsible charge related to this service. The patient expressed understanding and agreed to proceed.   History of Present Illness:  63 y.o. female former smoker with wheezing and nocturnal hypoxia after RULectomy in March 6283 for NSCLC complicated by steroid responsive pulmonary infiltrates. Maintenance: Symbicort 160 Patient of: Dr. Halford [name]  Patient consented to consult via telephone: Yes  People present and their role in pt care: Pt   Chief complaint: 4 week follow up    63 year old female former smoker followed in our office for wheezing and asthma.  Patient completing a 4-week tele-visit with our office today.  Patient was treated 4 weeks ago for asthma exacerbation/bronchitis.  Patient reports today 4 weeks later that symptoms have completely resolved.  She denies wheezing, cough, increased shortness of breath.  She is actually on a vacation fishing trip at this moment.  She reports she continues to take Symbicort 160 as prescribed.  She has no current acute symptoms or complaints at this time.   Observations/Objective:  Pulmonary tests: PET scan 6/62/94 >> hypermetabolic RUL nodule 1.4 x 1.3 cm PFT 04/07/14 >> FEV1 1.91 (85%), FEV1% 73, TLC 4.22 (98%), DLCO 56% Labs 05/06/15 >> CPK 59, RF < 10, ANA negative, ESR 23 PFT 05/21/15 >> FEV1 1.96 (83%), FEV1% 73, FEF 25-75% 1.56 (68%), TLC 4.46 (96%), DLCO 55%, +BD from FEF 25-75% FeNO 12/03/15 >> 5 PFT 05/16/17 >> FEV1 1.93 (84%), FEV1% 74, TLC 4.16 (90%), DLCO 71%, no BD  Sleep tests ONO with RA 07/24/14 >>test  time 9 hrs 57 min. Mean SpO2 91.8%, low SpO2 82%. Spent 1 hr 59 min with SpO2 <88%. ONO on RA 06/01/15 >>test time 7 hrs 50 min. Mean SpO2 90.07%, low SpO2 67%. Spent 2 hrs 24 min with SpO2 <88%. She needs to continue using supplemental oxygen at night ONO with RA 05/30/17 >>test time 4 hrs 44 min. Average SpO2 90%, low SpO2 78%. Spent 1 hr 7 min with SpO2 <88%.  Cardiac tests Echo 04/28/14 >> EF 55 to 76%, grade 1 diastolic dysfx   Lab Results  Component Value Date   NITRICOXIDE <5 12/03/2015      Assessment and Plan:  Asthma Assessment: Resolution of wheezing as well as cough after treatment with prednisone Continues to be maintained on Symbicort 160 Has not needed rescue inhaler over the last week  Plan: Continue Symbicort Follow-up with our office in 6 months Present to our office sooner if symptoms worsen  Bronchitis with asthma, acute Assessment: Complete resolution of symptoms per patient Patient is now currently on a vacation fishing trip with no issues with her breathing or cough or wheezing  Plan: Continue Symbicort Follow-up with our office in 6 months  Follow Up Instructions:  Return in about 6 months (around 11/28/2018), or if symptoms worsen or fail to improve, for Follow up with Gabrielle Quaker FNP-C, Follow up with Dr. Halford [name].    I discussed the assessment and treatment plan with the patient. The patient was provided an opportunity to ask questions and all were answered. The patient agreed with the plan and demonstrated  an understanding of the instructions.   The patient was advised to call back or seek an in-person evaluation if the symptoms worsen or if the condition fails to improve as anticipated.  I provided 14 minutes of non-face-to-face time during this encounter.   Gabrielle Rinne, NP

## 2018-05-28 ENCOUNTER — Encounter: Payer: Self-pay | Admitting: Pulmonary Disease

## 2018-05-28 ENCOUNTER — Other Ambulatory Visit: Payer: Self-pay

## 2018-05-28 ENCOUNTER — Ambulatory Visit (INDEPENDENT_AMBULATORY_CARE_PROVIDER_SITE_OTHER): Payer: BLUE CROSS/BLUE SHIELD | Admitting: Pulmonary Disease

## 2018-05-28 DIAGNOSIS — J45909 Unspecified asthma, uncomplicated: Secondary | ICD-10-CM | POA: Diagnosis not present

## 2018-05-28 DIAGNOSIS — J209 Acute bronchitis, unspecified: Secondary | ICD-10-CM | POA: Diagnosis not present

## 2018-05-28 DIAGNOSIS — J4520 Mild intermittent asthma, uncomplicated: Secondary | ICD-10-CM

## 2018-05-28 NOTE — Assessment & Plan Note (Signed)
Assessment: Resolution of wheezing as well as cough after treatment with prednisone Continues to be maintained on Symbicort 160 Has not needed rescue inhaler over the last week  Plan: Continue Symbicort Follow-up with our office in 6 months Present to our office sooner if symptoms worsen

## 2018-05-28 NOTE — Patient Instructions (Addendum)
Only use your albuterol as a rescue medication to be used if you can't catch your breath by resting or doing a relaxed purse lip breathing pattern.  - The less you use it, the better it will work when you need it. - Ok to use up to 2 puffs every 4 hours if you must but call for immediate appointment if use goes up over your usual need - Don't leave home without it !! (think of it like the spare tire for your car)   albuterol nebulized medication Can use every 6-8 hours as needed for shortness of breath and wheezing in place of rescue inhaler  Tessalon Perles 200 mg >>>Patient can take 1 every 8 hours as needed for cough  Can continue to take over-the-counter cough medication at this time  Continue Symbicort 160 >>> 2 puffs in the morning right when you wake up, rinse out your mouth after use, 12 hours later 2 puffs, rinse after use >>> Take this daily, no matter what >>> This is not a rescue inhaler     Return in about 6 months (around 11/28/2018), or if symptoms worsen or fail to improve, for Follow up with Wyn Quaker FNP-C, Follow up with Dr. Halford Chessman.   Coronavirus (COVID-19) Are you at risk?  Are you at risk for the Coronavirus (COVID-19)?  To be considered HIGH RISK for Coronavirus (COVID-19), you have to meet the following criteria:  . Traveled to [place], [place], [place], [place] or [place]; or in the [place] to [place], [place], [place], or [place]; and have fever, cough, and shortness of breath within the last 2 weeks of travel OR . Been in close contact with a person diagnosed with COVID-19 within the last 2 weeks and have fever, cough, and shortness of breath . IF YOU DO NOT MEET THESE CRITERIA, YOU ARE CONSIDERED LOW RISK FOR COVID-19.  What to do if you are HIGH RISK for COVID-19?  Marland Kitchen If you are having a medical emergency, call 911. . Seek medical care right away. Before you go to a doctor's office, urgent care or emergency department, call ahead and  tell them about your recent travel, contact with someone diagnosed with COVID-19, and your symptoms. You should receive instructions from your physician's office regarding next steps of care.  . When you arrive at healthcare provider, tell the healthcare staff immediately you have returned from visiting [place], [place], [place], [place] or [place]; or traveled in the [place] to [place], [place], [place], or [place]; in the last two weeks or you have been in close contact with a person diagnosed with COVID-19 in the last 2 weeks.   . Tell the health care staff about your symptoms: fever, cough and shortness of breath. . After you have been seen by a medical provider, you will be either: o Tested for (COVID-19) and discharged home on quarantine except to seek medical care if symptoms worsen, and asked to  - Stay home and avoid contact with others until you get your results (4-5 days)  - Avoid travel on public transportation if possible (such as bus, train, or airplane) or o Sent to the Emergency Department by EMS for evaluation, COVID-19 testing, and possible admission depending on your condition and test results.  What to do if you are LOW RISK for COVID-19?  Reduce your risk of any infection by using the same precautions used for avoiding the common cold or flu:  Marland Kitchen Wash your hands  often with soap and warm water for at least 20 seconds.  If soap and water are not readily available, use an alcohol-based hand sanitizer with at least 60% alcohol.  . If coughing or sneezing, cover your mouth and nose by coughing or sneezing into the elbow areas of your shirt or coat, into a tissue or into your sleeve (not your hands). . Avoid shaking hands with others and consider head nods or verbal greetings only. . Avoid touching your eyes, nose, or mouth with unwashed hands.  . Avoid close contact with people who are sick. . Avoid places or events with large numbers of people in one location, like  concerts or sporting events. . Carefully consider travel plans you have or are making. . If you are planning any travel outside or inside the [place], visit the CDC's Travelers' Health webpage for the latest health notices. . If you have some symptoms but not all symptoms, continue to monitor at home and seek medical attention if your symptoms worsen. . If you are having a medical emergency, call 911.   [facility] / e-Visit: [email or web address]         [facility]: [facility]: [phone]                   [facility]: [phone]           It is flu season:   >>> Best ways to protect herself from the flu: Receive the yearly flu vaccine, practice good hand hygiene washing with soap and also using hand sanitizer when available, eat a nutritious meals, get adequate rest, hydrate appropriately   Please contact the office if your symptoms worsen or you have concerns that you are not improving.   Thank you for choosing [facility] for your healthcare, and for allowing [place] to partner with you on your healthcare journey. I am thankful to be able to provide care to you today.   Wyn Quaker FNP-C

## 2018-05-28 NOTE — Assessment & Plan Note (Signed)
Assessment: Complete resolution of symptoms per patient Patient is now currently on a vacation fishing trip with no issues with her breathing or cough or wheezing  Plan: Continue Symbicort Follow-up with our office in 6 months

## 2018-06-06 DIAGNOSIS — C50911 Malignant neoplasm of unspecified site of right female breast: Secondary | ICD-10-CM | POA: Diagnosis not present

## 2018-06-13 DIAGNOSIS — M8580 Other specified disorders of bone density and structure, unspecified site: Secondary | ICD-10-CM | POA: Diagnosis not present

## 2018-06-13 DIAGNOSIS — Z780 Asymptomatic menopausal state: Secondary | ICD-10-CM | POA: Insufficient documentation

## 2018-06-13 DIAGNOSIS — J449 Chronic obstructive pulmonary disease, unspecified: Secondary | ICD-10-CM | POA: Diagnosis not present

## 2018-06-13 DIAGNOSIS — C3410 Malignant neoplasm of upper lobe, unspecified bronchus or lung: Secondary | ICD-10-CM | POA: Diagnosis not present

## 2018-06-13 DIAGNOSIS — C50811 Malignant neoplasm of overlapping sites of right female breast: Secondary | ICD-10-CM | POA: Diagnosis not present

## 2018-06-28 DIAGNOSIS — C3411 Malignant neoplasm of upper lobe, right bronchus or lung: Secondary | ICD-10-CM | POA: Diagnosis not present

## 2018-06-28 DIAGNOSIS — J439 Emphysema, unspecified: Secondary | ICD-10-CM | POA: Diagnosis not present

## 2018-06-28 DIAGNOSIS — C50811 Malignant neoplasm of overlapping sites of right female breast: Secondary | ICD-10-CM | POA: Diagnosis not present

## 2018-06-28 DIAGNOSIS — Z853 Personal history of malignant neoplasm of breast: Secondary | ICD-10-CM | POA: Diagnosis not present

## 2018-06-28 DIAGNOSIS — I700 Atherosclerosis of aorta: Secondary | ICD-10-CM | POA: Diagnosis not present

## 2018-06-28 DIAGNOSIS — C3410 Malignant neoplasm of upper lobe, unspecified bronchus or lung: Secondary | ICD-10-CM | POA: Diagnosis not present

## 2018-07-24 DIAGNOSIS — C3410 Malignant neoplasm of upper lobe, unspecified bronchus or lung: Secondary | ICD-10-CM | POA: Diagnosis not present

## 2018-07-24 DIAGNOSIS — R928 Other abnormal and inconclusive findings on diagnostic imaging of breast: Secondary | ICD-10-CM | POA: Diagnosis not present

## 2018-07-24 DIAGNOSIS — C50811 Malignant neoplasm of overlapping sites of right female breast: Secondary | ICD-10-CM | POA: Diagnosis not present

## 2018-07-24 DIAGNOSIS — N6489 Other specified disorders of breast: Secondary | ICD-10-CM | POA: Diagnosis not present

## 2018-08-23 ENCOUNTER — Ambulatory Visit: Payer: BLUE CROSS/BLUE SHIELD | Admitting: Pulmonary Disease

## 2018-09-03 NOTE — Progress Notes (Signed)
Reviewed and agree with assessment/plan.   Zissy Hamlett, MD [facility] 01/19/2016, 12:24 PM Pager:  [phone]  

## 2018-09-10 DIAGNOSIS — Z0000 Encounter for general adult medical examination without abnormal findings: Secondary | ICD-10-CM | POA: Diagnosis not present

## 2018-09-13 DIAGNOSIS — Z0001 Encounter for general adult medical examination with abnormal findings: Secondary | ICD-10-CM | POA: Diagnosis not present

## 2018-09-13 DIAGNOSIS — E782 Mixed hyperlipidemia: Secondary | ICD-10-CM | POA: Diagnosis not present

## 2018-09-13 DIAGNOSIS — E871 Hypo-osmolality and hyponatremia: Secondary | ICD-10-CM | POA: Diagnosis not present

## 2018-09-13 DIAGNOSIS — G4700 Insomnia, unspecified: Secondary | ICD-10-CM | POA: Diagnosis not present

## 2018-10-21 DIAGNOSIS — E871 Hypo-osmolality and hyponatremia: Secondary | ICD-10-CM | POA: Diagnosis not present

## 2018-10-21 DIAGNOSIS — R7989 Other specified abnormal findings of blood chemistry: Secondary | ICD-10-CM | POA: Diagnosis not present

## 2018-10-21 DIAGNOSIS — E782 Mixed hyperlipidemia: Secondary | ICD-10-CM | POA: Diagnosis not present

## 2018-10-21 DIAGNOSIS — K219 Gastro-esophageal reflux disease without esophagitis: Secondary | ICD-10-CM | POA: Diagnosis not present

## 2018-10-21 DIAGNOSIS — R5383 Other fatigue: Secondary | ICD-10-CM | POA: Diagnosis not present

## 2018-10-21 DIAGNOSIS — I10 Essential (primary) hypertension: Secondary | ICD-10-CM | POA: Diagnosis not present

## 2018-10-25 DIAGNOSIS — J439 Emphysema, unspecified: Secondary | ICD-10-CM | POA: Diagnosis not present

## 2018-10-25 DIAGNOSIS — C3491 Malignant neoplasm of unspecified part of right bronchus or lung: Secondary | ICD-10-CM | POA: Diagnosis not present

## 2018-10-25 DIAGNOSIS — R0789 Other chest pain: Secondary | ICD-10-CM | POA: Diagnosis not present

## 2018-10-25 DIAGNOSIS — I10 Essential (primary) hypertension: Secondary | ICD-10-CM | POA: Diagnosis not present

## 2018-10-25 DIAGNOSIS — Z87891 Personal history of nicotine dependence: Secondary | ICD-10-CM | POA: Diagnosis not present

## 2018-10-25 DIAGNOSIS — Z23 Encounter for immunization: Secondary | ICD-10-CM | POA: Diagnosis not present

## 2018-10-31 DIAGNOSIS — I10 Essential (primary) hypertension: Secondary | ICD-10-CM | POA: Diagnosis not present

## 2018-10-31 DIAGNOSIS — R079 Chest pain, unspecified: Secondary | ICD-10-CM | POA: Diagnosis not present

## 2018-10-31 DIAGNOSIS — I7090 Unspecified atherosclerosis: Secondary | ICD-10-CM | POA: Diagnosis not present

## 2018-10-31 DIAGNOSIS — I6523 Occlusion and stenosis of bilateral carotid arteries: Secondary | ICD-10-CM | POA: Diagnosis not present

## 2018-11-25 ENCOUNTER — Encounter: Payer: Self-pay | Admitting: Pulmonary Disease

## 2018-11-25 ENCOUNTER — Other Ambulatory Visit: Payer: Self-pay

## 2018-11-25 ENCOUNTER — Ambulatory Visit: Payer: BLUE CROSS/BLUE SHIELD | Admitting: Pulmonary Disease

## 2018-11-25 VITALS — BP 120/66 | HR 88 | Temp 97.6°F | Ht 61.0 in | Wt 169.6 lb

## 2018-11-25 DIAGNOSIS — G4734 Idiopathic sleep related nonobstructive alveolar hypoventilation: Secondary | ICD-10-CM | POA: Diagnosis not present

## 2018-11-25 DIAGNOSIS — J4520 Mild intermittent asthma, uncomplicated: Secondary | ICD-10-CM | POA: Diagnosis not present

## 2018-11-25 DIAGNOSIS — J4541 Moderate persistent asthma with (acute) exacerbation: Secondary | ICD-10-CM

## 2018-11-25 DIAGNOSIS — J9611 Chronic respiratory failure with hypoxia: Secondary | ICD-10-CM

## 2018-11-25 MED ORDER — BUDESONIDE-FORMOTEROL FUMARATE 160-4.5 MCG/ACT IN AERO: 2.0000 | INHALATION_SPRAY | Freq: Two times a day (BID) | RESPIRATORY_TRACT | 5 refills | Status: DC

## 2018-11-25 MED ORDER — ALBUTEROL SULFATE HFA 108 (90 BASE) MCG/ACT IN AERS: 2.0000 | INHALATION_SPRAY | Freq: Four times a day (QID) | RESPIRATORY_TRACT | 5 refills | Status: DC | PRN

## 2018-11-25 NOTE — Progress Notes (Signed)
[place]  No chief complaint on file.   Constitutional:  BP 120/66 (BP Location: Left Arm, Patient Position: Sitting, Cuff Size: Normal)   Pulse 88   Temp 97.6 F (36.4 C)   Ht 5' 1" (1.549 m)   Wt 169 lb 9.6 oz (76.9 kg)   SpO2 99% Comment: on room air  BMI 32.05 kg/m   Past Medical History:  Bipolar, depression, Chronic pain GERD, HLD, Orthostatic hypotension, ETOH, Rt breast cancer  Brief Summary:  Gabrielle [name] is a 63 y.o. female former smoker with wheezing and nocturnal hypoxia after RULectomy in March 2016 for NSCLC complicated by steroid responsive pulmonary infiltrates.  Breathing doing okay.  Not having cough, wheeze, sputum, or chest congestion.  Using oxygen at night and this helps her sleep and breathing.  Using symbicort intermittently.  Hasn't needed to use ventolin for a while.  Physical Exam:   Appearance - well kempt   ENMT - clear nasal mucosa, midline nasal  septum, no oral exudates, no LAN, trachea midline  Respiratory - normal chest wall, normal respiratory effort, no accessory muscle use, no wheeze/rales  CV - s1s2 regular rate and rhythm, no murmurs, no peripheral edema, radial pulses symmetric  GI - soft, non tender, no masses  Lymph - no adenopathy noted in neck and axillary areas  MSK - normal gait  Ext - no cyanosis, clubbing, or joint inflammation noted  Skin - no rashes, lesions, or ulcers  Neuro - normal strength, oriented x 3  Psych - normal mood and affect   Assessment/Plan:   Persistent asthma and mild emphysema changes on CT chest. - prn symbicort and ventolin > refills sent  Nocturnal hypoxemia. - continue 2 liters oxygen at night   Patient Instructions  Follow up in 1 year    Vineet Sood, MD [place] [facility] Pager: [phone] 11/25/2018, 3:56 PM  Flow Sheet     Pulmonary tests:  PFT 04/07/14 >> FEV1 1.91 (85%), FEV1% 73, TLC 4.22 (98%), DLCO  56% Labs 05/06/15 >> CPK 59, RF < 10, ANA negative, ESR 23 PFT 05/21/15 >> FEV1 1.96 (83%), FEV1% 73, FEF 25-75% 1.56 (68%), TLC 4.46 (96%), DLCO 55%, +BD from FEF 25-75% FeNO 12/03/15 >> 5 PFT 05/16/17 >> FEV1 1.93 (84%), FEV1% 74, TLC 4.16 (90%), DLCO 71%, no BD  Chest imaging:  PET scan 04/03/14 >> hypermetabolic RUL nodule 1.4 x 1.3 cm  Sleep tests:  ONO with RA 07/24/14 >>test time 9 hrs 57 min. Mean SpO2 91.8%, low SpO2 82%. Spent 1 hr 59 min with SpO2 <88%. ONO on RA 06/01/15 >>test time 7 hrs 50 min. Mean SpO2 90.07%, low SpO2 67%. Spent 2 hrs 24 min with SpO2 <88%. She needs to continue using supplemental oxygen at night ONO with RA 05/30/17 >>test time 4 hrs 44 min. Average SpO2 90%, low SpO2 78%. Spent 1 hr 7 min with SpO2 <88%.  Cardiac tests:  Echo 04/28/14 >> EF 55 to 60%, grade 1 diastolic dysfx  Medications:   Allergies as of 11/25/2018      Reactions   Hydrocodone Other (See Comments)   Makes her hyper      Medication List       Accurate as of November 25, 2018  3:56 PM. If you have any questions, ask your nurse or doctor.        STOP taking these medications   Calcium 600+D 600-800 MG-UNIT Tabs Generic drug: Calcium Carb-Cholecalciferol Stopped by: Vineet   Sood, MD   doxycycline 100 MG tablet Commonly known as: VIBRA-TABS Stopped by: Vineet Sood, MD   predniSONE 10 MG tablet Commonly known as: DELTASONE Stopped by: Vineet Sood, MD   sucralfate 1 g tablet Commonly known as: CARAFATE Stopped by: Vineet Sood, MD     TAKE these medications   albuterol (2.5 MG/3ML) 0.083% nebulizer solution Commonly known as: PROVENTIL Take 3 mLs (2.5 mg total) by nebulization every 6 (six) hours as needed for wheezing or shortness of breath.   albuterol 108 (90 Base) MCG/ACT inhaler Commonly known as: VENTOLIN HFA Inhale 2 puffs into the lungs every 6 (six) hours as needed for wheezing or shortness of breath.   atorvastatin 20 MG tablet Commonly known as:  LIPITOR Take 20 mg by mouth at bedtime.   benzonatate 200 MG capsule Commonly known as: TESSALON Take 1 capsule (200 mg total) by mouth 3 (three) times daily as needed for cough.   budesonide-formoterol 160-4.5 MCG/ACT inhaler Commonly known as: SYMBICORT Inhale 2 puffs into the lungs 2 (two) times daily.   eszopiclone 1 MG Tabs tablet Commonly known as: LUNESTA Take 1 mg by mouth at bedtime as needed for sleep. Take immediately before bedtime   gabapentin 800 MG tablet Commonly known as: NEURONTIN Take 800 mg by mouth 3 (three) times daily.   hydrOXYzine 50 MG tablet Commonly known as: ATARAX/VISTARIL Take 50 mg by mouth at bedtime.   lisinopril 20 MG tablet Commonly known as: ZESTRIL Take 20 mg by mouth daily.   omeprazole 40 MG capsule Commonly known as: PRILOSEC Take 40 mg by mouth daily.   trazodone 300 MG tablet Commonly known as: DESYREL Take 300 mg by mouth at bedtime.   TYLENOL 500 MG tablet Generic drug: acetaminophen Take 500 mg by mouth every 8 (eight) hours as needed.       Past Surgical History:  She  has a past surgical history that includes Breast lumpectomy with sentinel lymph node bx (Right, 04/2012); Tonsillectomy; Video bronchoscopy (N/A, 04/20/2014); Video assisted thoracoscopy (vats)/wedge resection (Right, 04/20/2014); Wedge resection (Right, 04/20/2014); Lobectomy (Right, 04/20/2014); Node dissection (Right, 04/20/2014); Breast lumpectomy (Left, 1990); Esophagogastroduodenoscopy (egd) with propofol (N/A, 05/10/2017); Breast lumpectomy (Right); Vaginal hysterectomy; Placement of breast implants (1993); Plantar fascia release (Left); and Shoulder arthroscopy with subacromial decompression, rotator cuff repair and bicep tendon repair (Right, 06/27/2017).  Family History:  Her family history includes Hypertension in her father and mother; Leukemia in her maternal grandmother; Lymphoma (age of onset: 77) in her maternal aunt.  Social History:  She  reports  that she quit smoking about 4 years ago. Her smoking use included cigarettes. She has a 40.00 pack-year smoking history. She has never used smokeless tobacco. She reports current alcohol use of about 6.0 standard drinks of alcohol per week. She reports that she does not use drugs.      

## 2018-11-25 NOTE — Patient Instructions (Signed)
Follow up in 1 year.

## 2018-12-11 DIAGNOSIS — R300 Dysuria: Secondary | ICD-10-CM | POA: Diagnosis not present

## 2018-12-11 DIAGNOSIS — Z6831 Body mass index (BMI) 31.0-31.9, adult: Secondary | ICD-10-CM | POA: Diagnosis not present

## 2018-12-13 DIAGNOSIS — I10 Essential (primary) hypertension: Secondary | ICD-10-CM | POA: Diagnosis not present

## 2018-12-13 DIAGNOSIS — I7090 Unspecified atherosclerosis: Secondary | ICD-10-CM | POA: Diagnosis not present

## 2018-12-13 DIAGNOSIS — R079 Chest pain, unspecified: Secondary | ICD-10-CM | POA: Diagnosis not present

## 2018-12-16 DIAGNOSIS — F1010 Alcohol abuse, uncomplicated: Secondary | ICD-10-CM | POA: Diagnosis not present

## 2018-12-16 DIAGNOSIS — I10 Essential (primary) hypertension: Secondary | ICD-10-CM | POA: Diagnosis not present

## 2018-12-16 DIAGNOSIS — C50911 Malignant neoplasm of unspecified site of right female breast: Secondary | ICD-10-CM | POA: Diagnosis not present

## 2018-12-16 DIAGNOSIS — G8929 Other chronic pain: Secondary | ICD-10-CM | POA: Diagnosis not present

## 2018-12-16 DIAGNOSIS — C3411 Malignant neoplasm of upper lobe, right bronchus or lung: Secondary | ICD-10-CM | POA: Diagnosis not present

## 2018-12-16 DIAGNOSIS — I2510 Atherosclerotic heart disease of native coronary artery without angina pectoris: Secondary | ICD-10-CM | POA: Diagnosis not present

## 2018-12-16 DIAGNOSIS — K219 Gastro-esophageal reflux disease without esophagitis: Secondary | ICD-10-CM | POA: Diagnosis not present

## 2018-12-16 DIAGNOSIS — J449 Chronic obstructive pulmonary disease, unspecified: Secondary | ICD-10-CM | POA: Diagnosis not present

## 2018-12-16 DIAGNOSIS — M8580 Other specified disorders of bone density and structure, unspecified site: Secondary | ICD-10-CM | POA: Diagnosis not present

## 2018-12-16 DIAGNOSIS — Z6831 Body mass index (BMI) 31.0-31.9, adult: Secondary | ICD-10-CM | POA: Diagnosis not present

## 2018-12-16 DIAGNOSIS — C3410 Malignant neoplasm of upper lobe, unspecified bronchus or lung: Secondary | ICD-10-CM | POA: Diagnosis not present

## 2018-12-16 DIAGNOSIS — E871 Hypo-osmolality and hyponatremia: Secondary | ICD-10-CM | POA: Diagnosis not present

## 2018-12-16 DIAGNOSIS — Z923 Personal history of irradiation: Secondary | ICD-10-CM | POA: Diagnosis not present

## 2018-12-16 DIAGNOSIS — E669 Obesity, unspecified: Secondary | ICD-10-CM | POA: Diagnosis not present

## 2018-12-16 DIAGNOSIS — Z902 Acquired absence of lung [part of]: Secondary | ICD-10-CM | POA: Diagnosis not present

## 2018-12-16 DIAGNOSIS — E785 Hyperlipidemia, unspecified: Secondary | ICD-10-CM | POA: Diagnosis not present

## 2019-01-22 ENCOUNTER — Telehealth: Payer: Self-pay | Admitting: Pulmonary Disease

## 2019-01-22 ENCOUNTER — Other Ambulatory Visit: Payer: Self-pay

## 2019-01-22 ENCOUNTER — Other Ambulatory Visit: Payer: Self-pay | Admitting: Nurse Practitioner

## 2019-01-22 ENCOUNTER — Telehealth: Payer: Self-pay | Admitting: Acute Care

## 2019-01-22 ENCOUNTER — Ambulatory Visit (INDEPENDENT_AMBULATORY_CARE_PROVIDER_SITE_OTHER): Payer: BC Managed Care – PPO | Admitting: Internal Medicine

## 2019-01-22 VITALS — BP 122/60 | HR 106 | Ht 61.0 in | Wt 169.8 lb

## 2019-01-22 DIAGNOSIS — J4520 Mild intermittent asthma, uncomplicated: Secondary | ICD-10-CM | POA: Diagnosis not present

## 2019-01-22 DIAGNOSIS — U071 COVID-19: Secondary | ICD-10-CM | POA: Diagnosis not present

## 2019-01-22 DIAGNOSIS — I10 Essential (primary) hypertension: Secondary | ICD-10-CM

## 2019-01-22 DIAGNOSIS — Z85118 Personal history of other malignant neoplasm of bronchus and lung: Secondary | ICD-10-CM | POA: Diagnosis not present

## 2019-01-22 NOTE — Telephone Encounter (Signed)
See telephone note dated 01/22/2019

## 2019-01-22 NOTE — Telephone Encounter (Signed)
I have called the patient. I explained that I have received the positive Covid test results from [facility]. I told her I would set her up for the Respiratory Clinic tonight if possible for a CXR and a visit to help manage symptoms. I have messaged Ashley Akin to make the appointment.I told her she would get a call if we were able to secure an appointment.    I have also told her due to her co morbidities , history of  asthma and her age she may qualify for monoclonal antibody. I have messaged Lazaro Arms NP to start the screening process. Symptoms started 12/29.  I have encouraged her to sleep on her stomach if she is able, and to seek emergency care if she develops oxygen desaturations. She wears nocturnal oxygen. At rest her sats are normal, but with exertion she states she drops to 94 or 95 %. She verbalized understanding of the above and had no further questions at conclusion of the phone call.

## 2019-01-22 NOTE — Telephone Encounter (Signed)
Duplicate - will sign off.

## 2019-01-22 NOTE — Progress Notes (Signed)
  I connected by phone with Gabrielle [name] on 01/22/2019 at 1:52 PM to discuss the potential use of an new treatment for mild to moderate COVID-19 viral infection in non-hospitalized patients.  This patient is a 63 y.o. female that meets the FDA criteria for Emergency Use Authorization of bamlanivimab or casirivimab\imdevimab.  Has a (+) direct SARS-CoV-2 viral test result  Has mild or moderate COVID-19   Is ? 63 years of age and weighs ? 40 kg  Is NOT hospitalized due to COVID-19  Is NOT requiring oxygen therapy or requiring an increase in baseline oxygen flow rate due to COVID-19  Is within 10 days of symptom onset  Has at least one of the high risk factor(s) for progression to severe COVID-19 and/or hospitalization as defined in EUA.  Specific high risk criteria : Hypertension  Patient Active Problem List   Diagnosis Date Noted  . Bronchitis with asthma, acute 04/30/2018  . Odynophagia   . Gastritis and gastroduodenitis   . COPD with acute exacerbation ([place]) 03/21/2017  . Asthma 01/06/2016  . Chronic respiratory failure ([place]) 05/21/2014  . Lung cancer, right upper lobe 04/24/2014  . S/P lobectomy of lung 04/20/2014  . Hypercholesteremia 04/06/2014  . Bilateral leg pain 10/14/2013  . Claudication ([place]) 08/20/2013  . Breast CA ([place]) 02/17/2013  . Invasive ductal carcinoma of right breast ([place]) 08/21/2012  . Tobacco abuse 08/21/2012  . S/P bilateral breast implants 08/21/2012  . HYPERLIPIDEMIA 04/29/2008  . HYPERTENSION 04/29/2008  . GERD 04/29/2008     I have spoken and communicated the following to the patient or parent/caregiver:  1. FDA has authorized the emergency use of bamlanivimab and casirivimab\imdevimab for the treatment of mild to moderate COVID-19 in adults and pediatric patients with positive results of direct SARS-CoV-2 viral testing who are 29 years of age and older weighing at least 40 kg, and who are at high risk for progressing to severe COVID-19  and/or hospitalization.  2. The significant known and potential risks and benefits of bamlanivimab and casirivimab\imdevimab, and the extent to which such potential risks and benefits are unknown.  3. Information on available alternative treatments and the risks and benefits of those alternatives, including clinical trials.  4. Patients treated with bamlanivimab and casirivimab\imdevimab should continue to self-isolate and use infection control measures (e.g., wear mask, isolate, social distance, avoid sharing personal items, clean and disinfect "high touch" surfaces, and frequent handwashing) according to CDC guidelines.   5. The patient or parent/caregiver has the option to accept or refuse bamlanivimab or casirivimab\imdevimab .  After reviewing this information with the patient, The patient agreed to proceed with receiving the bamlanimivab infusion and will be provided a copy of the Fact sheet prior to receiving the infusion.Fenton Foy 01/22/2019 1:52 PM

## 2019-01-22 NOTE — Telephone Encounter (Signed)
Message forwarded to app of the day, Eric Form, NP.  Gabrielle [name],   This patient was last seen in clinic by Dr. Halford Chessman on 11/25/18 due to asthma.  When I called her back she stated she had a rapid test done by [facility] this morning, after her ex husband tested positive yesterday.   I gave her our fax number and asked for her to call them and request the test result to be faxed here so it can be scanned into the chart.  The patient stated her symptoms yesterday were mild chills, headaches. Today she now has body aches, diarrhea and a temp of 99.  The patient is concerned because of her medical history and specifically asked if she can be issued prednisone, an antibiotic and Hydroxychloroquine because she heard that when given in the early stages of covid it is beneficial.  When she made the medication request and I asked her if she has been coughing she said yes, but nothing is coming up. However, she never said anything about coughing when she told me her symptoms that she is having now.The patient was talking clearly and did hear any coughing during the call.   Based on this, please advise of recommendations. Thank you.

## 2019-01-23 NOTE — Progress Notes (Signed)
Respiratory Clinic Note  Patient: Gabrielle [name] Female    DOB: 01-12-1956   63 y.o.   MRN: [id number] Visit Date: 01/22/2019  Today's Provider: [place]   CC: Positive Covid screening follow-up  Subjective:    Covid-19 Nucleic Acid Test Results No results found for: SARSCOV2NAA, SARSCOV2  HPI on 12/29 the patient developed mild chills and mild headache. As of today 12/30 she developed loose stools x1 and nausea, myalgias, arthralgias, left low back spasm, chills without fever, and cough with clear sputum with flecks of yellow.  She has a history of right upper lobectomy for lung cancer in 2016 and also background of asthma.  She previously abused tobacco and had a 40-pack-year plus history of smoking, quitting in 2014.  She monitors O2 sats at home and they typically are 98% but with present symptoms O2 sats dropped to 92% with exertion.  Arrangements have been made for monoclonal infusion on January 4 and she questions whether she should pursue this.   Current Outpatient Medications:  .  acetaminophen (TYLENOL) 500 MG tablet, Take 500 mg by mouth every 8 (eight) hours as needed., Disp: , Rfl:  .  albuterol (VENTOLIN HFA) 108 (90 Base) MCG/ACT inhaler, Inhale 2 puffs into the lungs every 6 (six) hours as needed for wheezing or shortness of breath., Disp: 6.7 g, Rfl: 5 .  atorvastatin (LIPITOR) 40 MG tablet, Take 40 mg by mouth daily., Disp: , Rfl:  .  benzonatate (TESSALON) 200 MG capsule, Take 1 capsule (200 mg total) by mouth 3 (three) times daily as needed for cough., Disp: 90 capsule, Rfl: 1 .  budesonide-formoterol (SYMBICORT) 160-4.5 MCG/ACT inhaler, Inhale 2 puffs into the lungs 2 (two) times daily., Disp: 1 Inhaler, Rfl: 5 .  eszopiclone (LUNESTA) 1 MG TABS tablet, Take 1 mg by mouth at bedtime as needed for sleep. Take immediately before bedtime, Disp: , Rfl:  .  gabapentin (NEURONTIN) 800 MG tablet, Take 800 mg by mouth 3 (three) times daily. , Disp: , Rfl:  .   hydrOXYzine (ATARAX/VISTARIL) 50 MG tablet, Take 50 mg by mouth at bedtime., Disp: , Rfl:  .  lisinopril (PRINIVIL,ZESTRIL) 20 MG tablet, Take 20 mg by mouth daily., Disp: , Rfl:  .  omeprazole (PRILOSEC) 40 MG capsule, Take 40 mg by mouth daily. , Disp: , Rfl:  .  trazodone (DESYREL) 300 MG tablet, Take 300 mg by mouth at bedtime. , Disp: , Rfl:  .  Trospium Chloride 60 MG CP24, Take 1 capsule by mouth every morning., Disp: , Rfl:   Allergies  Allergen Reactions  . Hydrocodone Other (See Comments)    Makes her hyper    Review of Systems   Constitutional: No fever, significant weight change, fatigue  Eyes: No redness, discharge, pain, vision change ENT/mouth: No nasal congestion,  purulent discharge, earache, change in hearing, sore throat  Cardiovascular: No chest pain, palpitations, paroxysmal nocturnal dyspnea, claudication, edema  Respiratory: No hemoptysis Gastrointestinal: No heartburn, dysphagia, abdominal pain, nausea /vomiting, rectal bleeding, melena Genitourinary: No dysuria, hematuria, pyuria Dermatologic: No rash, pruritus, change in appearance of skin Neurologic: No dizziness Endocrine: No change in hair/skin/nails, excessive thirst, excessive hunger, excessive urination  Hematologic/lymphatic: No significant bruising, lymphadenopathy, abnormal bleeding Allergy/immunology: No itchy/watery eyes, significant sneezing, urticaria, angioedema    Objective:   BP 122/60   Pulse (!) 106   Ht 5\' 1"  (1.549 m)   Wt 169 lb 12.8 oz (77 kg)   SpO2 95%   BMI 32.08 kg/m  Physical Exam  Pertinent or positive findings: S4 is present.  Recheck of pulse was 85.  She has minor rales at the bases.  General appearance: Adequately nourished; no acute distress, increased work of breathing is present.   Lymphatic: No lymphadenopathy about the head, neck, axilla. Eyes: No conjunctival inflammation or lid edema is present. There is no scleral icterus. Ears:  External ear exam shows no  significant lesions or deformities.   Nose:  External nasal examination shows no deformity or inflammation. Nasal mucosa are pink and moist without lesions, exudates Oral exam:  Lips and gums are healthy appearing. There is no oropharyngeal erythema or exudate. Neck:  No thyromegaly, masses, tenderness noted.    Heart:  No gallop, murmur, click, rub .  Lungs:  without wheezes, rhonchi, rubs. Abdomen: Bowel sounds are normal. Abdomen is soft and nontender with no organomegaly, hernias, masses. GU: Deferred  Extremities:  No cyanosis, clubbing, edema  Skin: Warm & dry w/o tenting. No significant lesions or rash.  No results found for any visits on 01/22/19.    Assessment & Plan    #1+ PCR #2 history of lung cancer; Pulmonary has recommended monoclonal infusions on 1/4.  I encouraged her to complete this ;but return if there is significant change in condition before that appointment. I also asked her to verify that her thermometer is working as she has documented no fever at home.  She is to continue her asthma medications including Symbicort.  She is to use her home oxygen with exertion.    [facility]

## 2019-01-23 NOTE — Patient Instructions (Signed)
See assessment and plan under each diagnosis in the problem list and acutely for this visit 

## 2019-01-23 NOTE — Assessment & Plan Note (Signed)
No active bronchospasm at this time.  She is to continue her Symbicort on a regular basis.  She was encouraged to gargle and spit after using the Symbicort to prevent oropharyngeal candidiasis.

## 2019-01-27 ENCOUNTER — Ambulatory Visit (HOSPITAL_COMMUNITY)
Admission: RE | Admit: 2019-01-27 | Discharge: 2019-01-27 | Disposition: A | Discharge status: Home / Self Care | Payer: 59 | Source: Ambulatory Visit | Attending: Pulmonary Disease | Admitting: Pulmonary Disease

## 2019-01-27 DIAGNOSIS — Z23 Encounter for immunization: Secondary | ICD-10-CM | POA: Insufficient documentation

## 2019-01-27 DIAGNOSIS — I10 Essential (primary) hypertension: Secondary | ICD-10-CM | POA: Insufficient documentation

## 2019-01-27 DIAGNOSIS — U071 COVID-19: Secondary | ICD-10-CM | POA: Diagnosis present

## 2019-01-27 MED ORDER — ALBUTEROL SULFATE HFA 108 (90 BASE) MCG/ACT IN AERS: 2.0000 | INHALATION_SPRAY | Freq: Once | RESPIRATORY_TRACT | Status: DC | PRN

## 2019-01-27 MED ORDER — SODIUM CHLORIDE 0.9 % IV SOLN
700.0000 mg | Freq: Once | INTRAVENOUS | Status: AC
  Administered 2019-01-27: 11:00:00 700 mg via INTRAVENOUS
  Filled 2019-01-27: qty 20

## 2019-01-27 MED ORDER — DIPHENHYDRAMINE HCL 50 MG/ML IJ SOLN: 50.0000 mg | Freq: Once | INTRAMUSCULAR | Status: DC | PRN

## 2019-01-27 MED ORDER — METHYLPREDNISOLONE SODIUM SUCC 125 MG IJ SOLR: 125.0000 mg | Freq: Once | INTRAMUSCULAR | Status: DC | PRN

## 2019-01-27 MED ORDER — EPINEPHRINE 0.3 MG/0.3ML IJ SOAJ: 0.3000 mg | Freq: Once | INTRAMUSCULAR | Status: DC | PRN

## 2019-01-27 MED ORDER — SODIUM CHLORIDE 0.9 % IV SOLN
INTRAVENOUS | Status: DC | PRN
  Administered 2019-01-27: 250 mL via INTRAVENOUS

## 2019-01-27 MED ORDER — FAMOTIDINE IN NACL 20-0.9 MG/50ML-% IV SOLN: 20.0000 mg | Freq: Once | INTRAVENOUS | Status: DC | PRN

## 2019-01-27 NOTE — Discharge Instructions (Signed)

## 2019-01-27 NOTE — Progress Notes (Signed)
  Diagnosis: COVID-19  Physician: Dr, Joya Gaskins  Procedure: Covid Infusion Clinic Med: bamlanivimab infusion - Provided patient with bamlanimivab fact sheet for patients, parents and caregivers prior to infusion.  Complications: No immediate complications noted.  Discharge: Discharged home   Janine Ores 01/27/2019

## 2019-01-28 NOTE — Telephone Encounter (Signed)
Patient was set up with appt on 01/22/19 at 1830 at respiratory clinic.

## 2019-03-14 ENCOUNTER — Ambulatory Visit: Payer: 59 | Admitting: Physician Assistant

## 2019-03-14 ENCOUNTER — Other Ambulatory Visit: Payer: Self-pay

## 2019-03-14 ENCOUNTER — Ambulatory Visit (INDEPENDENT_AMBULATORY_CARE_PROVIDER_SITE_OTHER): Payer: 59 | Admitting: Physician Assistant

## 2019-03-14 ENCOUNTER — Encounter: Payer: Self-pay | Admitting: Physician Assistant

## 2019-03-14 VITALS — BP 126/74 | HR 86 | Temp 98.6°F | Ht 61.0 in | Wt 162.4 lb

## 2019-03-14 DIAGNOSIS — C3410 Malignant neoplasm of upper lobe, unspecified bronchus or lung: Secondary | ICD-10-CM

## 2019-03-14 DIAGNOSIS — E7800 Pure hypercholesterolemia, unspecified: Secondary | ICD-10-CM | POA: Diagnosis not present

## 2019-03-14 DIAGNOSIS — G4700 Insomnia, unspecified: Secondary | ICD-10-CM

## 2019-03-14 DIAGNOSIS — I10 Essential (primary) hypertension: Secondary | ICD-10-CM | POA: Diagnosis not present

## 2019-03-14 DIAGNOSIS — J441 Chronic obstructive pulmonary disease with (acute) exacerbation: Secondary | ICD-10-CM

## 2019-03-14 DIAGNOSIS — K219 Gastro-esophageal reflux disease without esophagitis: Secondary | ICD-10-CM

## 2019-03-14 DIAGNOSIS — J4540 Moderate persistent asthma, uncomplicated: Secondary | ICD-10-CM

## 2019-03-14 DIAGNOSIS — C50911 Malignant neoplasm of unspecified site of right female breast: Secondary | ICD-10-CM

## 2019-03-14 MED ORDER — BELSOMRA 10 MG PO TABS: 10.0000 mg | ORAL_TABLET | Freq: Every day | ORAL | 0 refills | Status: DC

## 2019-03-14 NOTE — Patient Instructions (Signed)
Instructions sent to MyChart.  When you return from out of town, please schedule an appointment for fasting labs.  This way we can update things and know what changes may need to be made.  Please continue chronic medications as directed with the following exceptions:   -Stop the trazodone and Lunesta.   -Start the Belsomra 10 mg once nightly over the next week.  Go to [email or web address] to download a free trial voucher.  Message me in a week to let me know how things are going so we can make further adjustments.  It was very nice meeting you today. Welcome to [employer]!

## 2019-03-14 NOTE — Progress Notes (Signed)
I have discussed the procedure for the virtual visit with the patient who has given consent to proceed with assessment and treatment.   Arian Mcquitty S Elishia Kaczorowski, CMA     

## 2019-03-14 NOTE — Progress Notes (Signed)
Virtual Visit via Video   I connected with patient on 03/14/19 at 11:00 AM EST by a video enabled telemedicine application and verified that I am speaking with the correct person using two identifiers.  Location patient: Home Location provider: [facility], Office Persons participating in the virtual visit: Patient, Provider, [place] (Patina Moore)  I discussed the limitations of evaluation and management by telemedicine and the availability of in person appointments. The patient expressed understanding and agreed to proceed.  Subjective:   HPI:   Patient presents via doxy.need today to establish care.  Patient with history of invasive ductal carcinoma of right breast (Stage I, Grade I, ER +, HER_2 negative diagnosed 05/03/2012) with metastatic lesions in the upper lung lobes (s/p video-assisted thorascopic R upper lobectomy -- 04/20/2014).  Continues to be in remission.  Followed by oncology with [facility] (Dr. Federico Flake).  Records available in care everywhere.  Also following with pulmonology for this and history of COPD and moderate persistent asthma.  Sees Dr. Chesley Mires at [facility]. Currently on a regimen of Symbicort 160-4.5 mcg per actuation --2 puffs twice daily.  Also has albuterol inhaler but very rare use.  Notes symptoms controlled.  GERD -- Significantly improved since stopping alcohol consumption. Uses omeprazole every 2-3 days. No breakthrough symptoms with medications.   Peripheral Neuropathy --had since treatment for her cancer.  Currently on a regimen of gabapentin 800 mg 3 times daily with good results.  Hyperlipidemia --patient currently on a regimen of atorvastatin 40 mg daily.  Endorses taking medications as directed and tolerating well.  Tries to keep a well-balanced diet.  Previously drinking a large amount of alcohol that has had complete cessation over the past few months.  Hypertension --patient is currently on a regimen of lisinopril 20 mg daily.  Endorses  taking medication as directed. Patient denies chest pain, palpitations, lightheadedness, dizziness, vision changes or frequent headaches.  BP Readings from Last 3 Encounters:  03/14/19 126/74  01/27/19 (!) 142/80  01/22/19 122/60   Patient endorses longstanding issue with insomnia.  Some issue with sleep onset but her main problem is with sleep maintenance.  Was previously on a regimen of trazodone nightly by prior PCP.  Notes this worked well for her initially but then became quite subtherapeutic.  Could not tolerate a higher dose without oversleeping/significant daytime somnolence.  Also after insurance change, the medication became too expensive.  Was then switched to Lunesta 1 mg nightly.  Patient endorses taking but notes 1 mg is subtherapeutic.  With higher dose she can get some rest but is still spotty.  Is wondering what other options are reasonable for her.  Patient does endorse a very remote history of substance abuse, in her 48s.  Has not had issue with this since.  Did have issue with alcohol abuse per EMR, but recently has achieved complete cessation.  ROS:   See pertinent positives and negatives per HPI.  Patient Active Problem List   Diagnosis Date Noted  . Bronchitis with asthma, acute 04/30/2018  . Odynophagia   . Gastritis and gastroduodenitis   . COPD with acute exacerbation ([place]) 03/21/2017  . Asthma 01/06/2016  . Chronic respiratory failure ([place]) 05/21/2014  . Lung cancer, right upper lobe 04/24/2014  . S/P lobectomy of lung 04/20/2014  . Hypercholesteremia 04/06/2014  . Bilateral leg pain 10/14/2013  . Claudication ([place]) 08/20/2013  . Breast CA ([place]) 02/17/2013  . Invasive ductal carcinoma of right breast ([place]) 08/21/2012  . Tobacco abuse 08/21/2012  .  S/P bilateral breast implants 08/21/2012  . HYPERLIPIDEMIA 04/29/2008  . HYPERTENSION 04/29/2008  . GERD 04/29/2008    Social History   Tobacco Use  . Smoking status: Former Smoker    Packs/day: 1.00     Years: 40.00    Pack years: 40.00    Types: Cigarettes    Quit date: 04/20/2014    Years since quitting: 4.9  . Smokeless tobacco: Never Used  Substance Use Topics  . Alcohol use: Yes    Alcohol/week: 6.0 standard drinks    Types: 6 Glasses of wine per week    Current Outpatient Medications:  .  acetaminophen (TYLENOL) 500 MG tablet, Take 500 mg by mouth every 8 (eight) hours as needed., Disp: , Rfl:  .  albuterol (VENTOLIN HFA) 108 (90 Base) MCG/ACT inhaler, Inhale 2 puffs into the lungs every 6 (six) hours as needed for wheezing or shortness of breath., Disp: 6.7 g, Rfl: 5 .  atorvastatin (LIPITOR) 40 MG tablet, Take 40 mg by mouth daily., Disp: , Rfl:  .  budesonide-formoterol (SYMBICORT) 160-4.5 MCG/ACT inhaler, Inhale 2 puffs into the lungs 2 (two) times daily., Disp: 1 Inhaler, Rfl: 5 .  eszopiclone (LUNESTA) 1 MG TABS tablet, Take 1 mg by mouth at bedtime as needed for sleep. Take immediately before bedtime, Disp: , Rfl:  .  gabapentin (NEURONTIN) 800 MG tablet, Take 800 mg by mouth 3 (three) times daily. , Disp: , Rfl:  .  hydrOXYzine (ATARAX/VISTARIL) 50 MG tablet, Take 50 mg by mouth at bedtime., Disp: , Rfl:  .  lisinopril (PRINIVIL,ZESTRIL) 20 MG tablet, Take 20 mg by mouth daily., Disp: , Rfl:  .  omeprazole (PRILOSEC) 40 MG capsule, Take 40 mg by mouth daily. , Disp: , Rfl:  .  trazodone (DESYREL) 300 MG tablet, Take 300 mg by mouth at bedtime. , Disp: , Rfl:  .  Trospium Chloride 60 MG CP24, Take 1 capsule by mouth every morning., Disp: , Rfl:  .  benzonatate (TESSALON) 200 MG capsule, Take 1 capsule (200 mg total) by mouth 3 (three) times daily as needed for cough. (Patient not taking: Reported on 03/14/2019), Disp: 90 capsule, Rfl: 1  Allergies  Allergen Reactions  . Hydrocodone Other (See Comments)    Makes her hyper    Objective:   There were no vitals taken for this visit.  Patient is well-developed, well-nourished in no acute distress.  Resting comfortably at  home.  Head is normocephalic, atraumatic.  No labored breathing.  Speech is clear and coherent with logical content.  Patient is alert and oriented at baseline.   Assessment and Plan:   1. Insomnia, unspecified type Stop trazodone.  Will also stop Lunesta.  Want to avoid any benzodiazepine including Restoril given her history of abuse.  Discussed the option of [place] summer.  She is willing to give this a try.  Rx for 10 tablets of 10 mg dose sent to pharmacy.  Instructions given for how to print out a free trial voucher.  She is to call us and let us know how this is working for her.  2. Essential hypertension BP normotensive at present.  Asymptomatic.  We will continue current regimen.  She is to schedule appointment for fasting labs.  3. Hypercholesteremia Continue statin for now.  Patient overdue for labs.  She is to schedule appointment for fasting labs.  4. Gastroesophageal reflux disease without esophagitis Continue PPI as directed.  Recommend GERD diet.  Will monitor.  5. COPD with acute  exacerbation ([place]) 6. Moderate persistent asthma without complication Continue management per specialist.  7. Malignant neoplasm of upper lobe of lung, unspecified laterality ([place]) 8. Invasive ductal carcinoma of right breast ([place]) Both in remission.  Has routine follow-up with oncology and pulmonology.  Continue management per specialist.    Leeanne Rio, PA-C 03/14/2019

## 2019-03-21 ENCOUNTER — Ambulatory Visit: Payer: 59 | Admitting: Physician Assistant

## 2019-04-02 ENCOUNTER — Encounter: Payer: Self-pay | Admitting: Physician Assistant

## 2019-04-02 ENCOUNTER — Other Ambulatory Visit (HOSPITAL_COMMUNITY)
Admission: RE | Admit: 2019-04-02 | Discharge: 2019-04-02 | Disposition: A | Discharge status: Home / Self Care | Payer: 59 | Source: Ambulatory Visit | Attending: Physician Assistant | Admitting: Physician Assistant

## 2019-04-02 ENCOUNTER — Ambulatory Visit: Payer: 59 | Admitting: Physician Assistant

## 2019-04-02 ENCOUNTER — Other Ambulatory Visit: Payer: Self-pay

## 2019-04-02 VITALS — BP 120/70 | HR 73 | Temp 97.9°F | Resp 16 | Ht 61.0 in | Wt 165.0 lb

## 2019-04-02 DIAGNOSIS — I10 Essential (primary) hypertension: Secondary | ICD-10-CM | POA: Diagnosis not present

## 2019-04-02 DIAGNOSIS — Z124 Encounter for screening for malignant neoplasm of cervix: Secondary | ICD-10-CM | POA: Diagnosis not present

## 2019-04-02 DIAGNOSIS — N898 Other specified noninflammatory disorders of vagina: Secondary | ICD-10-CM

## 2019-04-02 NOTE — Patient Instructions (Signed)
Please keep skin clean and dry. Avoid excessive scrubbing of the delicate vaginal tissues.  Clean water and small amount of non-scented, non-dyed soap (dove or cetaphil are good options).   Keep well-hydrated. Consider use of OTC vaginal moisturizer as well.  Let me know if things aren't resolving over the next week!

## 2019-04-02 NOTE — Progress Notes (Signed)
Patient presents to clinic today with concerns of a painful region at the inferior vagina, near vaginal opening. First noted some fullness about 3 weeks ago. Has been trying to clean the area, somewhat aggressively, per patient. Notes tenderness in the area and some fullness on the R inferior vagina as well. Denies vaginal pressure or discharge. Denies lesion. Denies change to bladder habits. Denies any sexual activity. S/p vaginal hysterectomy. Has history of breast cancer and is always concerned about cancer elsewhere.   Past Medical History:  Diagnosis Date  . Anemia   . Arthritis   . Asthma   . Breast cancer ([place]) 04/2012   right  . Carpal tunnel syndrome    bil  . Chronic pain in left foot   . Cough   . Depression    espisodic  . Diarrhea   . Dyspnea    with exertion an have failed over night oximenrty - wears oxygen at hs  . GERD (gastroesophageal reflux disease)   . H/O eating disorder   . History of anemia   . Hyperlipidemia   . Invasive ductal carcinoma of right breast ([place]) 08/21/2012  . Legally blind   . Lung cancer ([place])    right  . Orthostatic hypotension   . Pneumonia    03-13-17  . S/P bilateral breast implants 08/21/2012   Prior to cancer diagnosis  . Tobacco abuse 08/21/2012   > 40 pack years    Current Outpatient Medications on File Prior to Visit  Medication Sig Dispense Refill  . acetaminophen (TYLENOL) 500 MG tablet Take 500 mg by mouth every 8 (eight) hours as needed.    Marland Kitchen albuterol (VENTOLIN HFA) 108 (90 Base) MCG/ACT inhaler Inhale 2 puffs into the lungs every 6 (six) hours as needed for wheezing or shortness of breath. 6.7 g 5  . atorvastatin (LIPITOR) 40 MG tablet Take 40 mg by mouth daily.    . budesonide-formoterol (SYMBICORT) 160-4.5 MCG/ACT inhaler Inhale 2 puffs into the lungs 2 (two) times daily. 1 Inhaler 5  . cholecalciferol (VITAMIN D3) 25 MCG (1000 UNIT) tablet Take 1,000 Units by mouth daily.    Marland Kitchen gabapentin (NEURONTIN) 800 MG tablet Take  800 mg by mouth 3 (three) times daily.     . hydrOXYzine (ATARAX/VISTARIL) 50 MG tablet Take 50 mg by mouth at bedtime.    Marland Kitchen lisinopril (PRINIVIL,ZESTRIL) 20 MG tablet Take 20 mg by mouth daily.    Marland Kitchen omeprazole (PRILOSEC) 40 MG capsule Take 40 mg by mouth daily.     . Suvorexant (BELSOMRA) 10 MG TABS Take 10 mg by mouth at bedtime. 10 tablet 0  . zinc gluconate 50 MG tablet Take 50 mg by mouth daily.     No current facility-administered medications on file prior to visit.    Allergies  Allergen Reactions  . Hydrocodone Other (See Comments)    Makes her hyper    Family History  Problem Relation Age of Onset  . Hypertension Mother   . Hypertension Father   . Lymphoma Maternal Aunt 77  . Leukemia Maternal Grandmother        dx in her late 46s    Social History   Socioeconomic History  . Marital status: Divorced    Spouse name: Not on file  . Number of children: 1  . Years of education: Not on file  . Highest education level: Not on file  Occupational History    Employer: [employer]  Tobacco Use  . Smoking  status: Former Smoker    Packs/day: 1.00    Years: 40.00    Pack years: 40.00    Types: Cigarettes    Quit date: 04/20/2014    Years since quitting: 4.9  . Smokeless tobacco: Never Used  Substance and Sexual Activity  . Alcohol use: Yes    Alcohol/week: 6.0 standard drinks    Types: 6 Glasses of wine per week  . Drug use: No    Comment: history of substance abuse - none now-   . Sexual activity: Never  Other Topics Concern  . Not on file  Social History Narrative  . Not on file   Social Determinants of Health   Financial Resource Strain:   . Difficulty of Paying Living Expenses: Not on file  Food Insecurity:   . Worried About Charity fundraiser in the Last Year: Not on file  . Ran Out of Food in the Last Year: Not on file  Transportation Needs:   . Lack of Transportation (Medical): Not on file  . Lack of Transportation (Non-Medical): Not  on file  Physical Activity:   . Days of Exercise per Week: Not on file  . Minutes of Exercise per Session: Not on file  Stress:   . Feeling of Stress : Not on file  Social Connections:   . Frequency of Communication with Friends and Family: Not on file  . Frequency of Social Gatherings with Friends and Family: Not on file  . Attends Religious Services: Not on file  . Active Member of Clubs or Organizations: Not on file  . Attends Archivist Meetings: Not on file  . Marital Status: Not on file   Review of Systems - See HPI.  All other ROS are negative.  Wt 165 lb (74.8 kg)   BMI 31.18 kg/m   Physical Exam Vitals reviewed. Exam conducted with a chaperone present.  Constitutional:      Appearance: Normal appearance.  HENT:     Head: Normocephalic and atraumatic.  Abdominal:     Hernia: There is no hernia in the left inguinal area or right inguinal area.  Genitourinary:    Exam position: Supine.     Pubic Area: No rash.      Urethra: No prolapse, urethral pain, urethral swelling or urethral lesion.     Vagina: Normal.     Uterus: Absent.      Adnexa: Right adnexa normal and left adnexa normal.       Comments: Uterus, cervix surgically absent. Neurological:     Mental Status: She is alert.     Assessment/Plan: 1. Essential hypertension Patient due for labs. Will get today at her request. BP stable. Continue current regimen.  - Comprehensive metabolic panel - Lipid panel - Hemoglobin A1c  2. Vaginal irritation No concerning masses on examination. Some micro tears from her excess scrubbing in the area that need to be allowed to heal. Proper cleansing reviewed with patient. Will monitor.   3. Cervical cancer screening S/p hysterectomy but giving her significant concern about cancers will obtain PAP from the blind end of vagina to make sure this looks good. If normal, no further PAP needed.  - Cytology - PAP( [place])  This visit occurred during the  SARS-CoV-2 public health emergency.  Safety protocols were in place, including screening questions prior to the visit, additional usage of staff PPE, and extensive cleaning of exam room while observing appropriate contact time as indicated for disinfecting solutions.  Leeanne Rio, PA-C

## 2019-04-03 LAB — COMPREHENSIVE METABOLIC PANEL
ALT: 19 U/L (ref 0–35)
AST: 19 U/L (ref 0–37)
Albumin: 4.4 g/dL (ref 3.5–5.2)
Alkaline Phosphatase: 89 U/L (ref 39–117)
BUN: 10 mg/dL (ref 6–23)
CO2: 28 mEq/L (ref 19–32)
Calcium: 9.8 mg/dL (ref 8.4–10.5)
Chloride: 97 mEq/L (ref 96–112)
Creatinine, Ser: 0.65 mg/dL (ref 0.40–1.20)
GFR: 92.54 mL/min (ref >60.00)
Glucose, Bld: 87 mg/dL (ref 70–99)
Potassium: 4.1 mEq/L (ref 3.5–5.1)
Sodium: 133 mEq/L — ABNORMAL LOW (ref 135–145)
Total Bilirubin: 0.5 mg/dL (ref 0.2–1.2)
Total Protein: 6.4 g/dL (ref 6.0–8.3)

## 2019-04-03 LAB — LIPID PANEL
Cholesterol: 180 mg/dL (ref 0–200)
HDL: 68.7 mg/dL (ref >39.00)
LDL Cholesterol: 76 mg/dL (ref 0–99)
NonHDL: 111.43
Total CHOL/HDL Ratio: 3
Triglycerides: 177 mg/dL — ABNORMAL HIGH (ref 0.0–149.0)
VLDL: 35.4 mg/dL (ref 0.0–40.0)

## 2019-04-03 LAB — HEMOGLOBIN A1C: Hgb A1c MFr Bld: 5.9 % (ref 4.6–6.5)

## 2019-04-04 ENCOUNTER — Telehealth: Payer: Self-pay | Admitting: Physician Assistant

## 2019-04-04 NOTE — Telephone Encounter (Signed)
Patient states someone gave her a call yesterday and would like for someone to give her a call back.

## 2019-04-04 NOTE — Telephone Encounter (Signed)
Spoke with patient and gave her lab results. Encouraged to work on diet and exercise to lower her A1C level. Patient states her sodium level is always low.

## 2019-04-07 ENCOUNTER — Encounter: Payer: Self-pay | Admitting: Physician Assistant

## 2019-04-07 LAB — CYTOLOGY - PAP
Comment: NEGATIVE
Diagnosis: NEGATIVE
High risk HPV: NEGATIVE

## 2019-04-26 ENCOUNTER — Encounter: Payer: Self-pay | Admitting: Physician Assistant

## 2019-04-27 NOTE — Telephone Encounter (Signed)
Can you see if records have come in? Thank you.

## 2019-05-01 ENCOUNTER — Other Ambulatory Visit: Payer: Self-pay | Admitting: Physician Assistant

## 2019-05-01 ENCOUNTER — Encounter: Payer: Self-pay | Admitting: Emergency Medicine

## 2019-05-01 DIAGNOSIS — G4700 Insomnia, unspecified: Secondary | ICD-10-CM

## 2019-05-01 NOTE — Telephone Encounter (Signed)
See my chart message. Unsure if patient wants to continue current dose of Belsomra or increase the dose

## 2019-05-01 NOTE — Telephone Encounter (Signed)
My chart message sent to patient on how well the Belsomra is working before refilling medication

## 2019-05-02 ENCOUNTER — Ambulatory Visit: Payer: 59 | Attending: Internal Medicine

## 2019-05-02 ENCOUNTER — Telehealth: Payer: Self-pay | Admitting: Emergency Medicine

## 2019-05-02 DIAGNOSIS — Z23 Encounter for immunization: Secondary | ICD-10-CM

## 2019-05-02 NOTE — Telephone Encounter (Signed)
Prior authorization started for patient Gabrielle [name], thru Cover My Meds Waiting on insurance response

## 2019-05-02 NOTE — Progress Notes (Signed)
   Covid-19 Vaccination Clinic  Name:  Gabrielle [name]    MRN: [id number] DOB: 30-Oct-1955  05/02/2019  [name] was observed post Covid-19 immunization for 15 minutes without incident. She was provided with Vaccine Information Sheet and instruction to access the V-Safe system.   [name] was instructed to call 911 with any severe reactions post vaccine: Marland Kitchen Difficulty breathing  . Swelling of face and throat  . A fast heartbeat  . A bad rash all over body  . Dizziness and weakness   Immunizations Administered    Name Date Dose VIS Date Route   Pfizer COVID-19 Vaccine 05/02/2019 12:55 PM 0.3 mL 01/03/2019 Intramuscular   Manufacturer: [place]   Lot: [id number]   [place]: [id number]

## 2019-05-05 NOTE — Telephone Encounter (Signed)
[employer] approved patient Belsomra 10 mg  for 05/02/19-05/01/20

## 2019-05-26 ENCOUNTER — Ambulatory Visit: Payer: 59 | Attending: Internal Medicine

## 2019-06-03 ENCOUNTER — Ambulatory Visit: Payer: 59 | Attending: Internal Medicine

## 2019-06-03 DIAGNOSIS — Z23 Encounter for immunization: Secondary | ICD-10-CM

## 2019-06-03 NOTE — Progress Notes (Signed)
   Covid-19 Vaccination Clinic  Name:  Gabrielle [name]    MRN: [id number] DOB: 27-Aug-1955  06/03/2019  Gabrielle [name] was observed post Covid-19 immunization for 15 minutes without incident. She was provided with Vaccine Information Sheet and instruction to access the V-Safe system.   Gabrielle [name] was instructed to call 911 with any severe reactions post vaccine: Marland Kitchen Difficulty breathing  . Swelling of face and throat  . A fast heartbeat  . A bad rash all over body  . Dizziness and weakness   Immunizations Administered    Name Date Dose VIS Date Route   Pfizer COVID-19 Vaccine 06/03/2019  4:21 PM 0.3 mL 03/19/2018 Intramuscular   Manufacturer: [place]   Lot: [id number]   [place]: [id number]

## 2019-06-06 ENCOUNTER — Encounter: Payer: Self-pay | Admitting: Physician Assistant

## 2019-06-06 ENCOUNTER — Other Ambulatory Visit: Payer: Self-pay | Admitting: Physician Assistant

## 2019-06-06 DIAGNOSIS — G4700 Insomnia, unspecified: Secondary | ICD-10-CM

## 2019-06-06 MED ORDER — BELSOMRA 15 MG PO TABS: 15.0000 mg | ORAL_TABLET | Freq: Every day | ORAL | 1 refills | Status: DC

## 2019-07-07 ENCOUNTER — Telehealth (INDEPENDENT_AMBULATORY_CARE_PROVIDER_SITE_OTHER): Payer: 59 | Admitting: Physician Assistant

## 2019-07-07 ENCOUNTER — Encounter: Payer: Self-pay | Admitting: Physician Assistant

## 2019-07-07 ENCOUNTER — Other Ambulatory Visit: Payer: Self-pay

## 2019-07-07 DIAGNOSIS — G4700 Insomnia, unspecified: Secondary | ICD-10-CM | POA: Diagnosis not present

## 2019-07-07 MED ORDER — ZOLPIDEM TARTRATE 10 MG PO TABS: 10.0000 mg | ORAL_TABLET | Freq: Every evening | ORAL | 1 refills | Status: DC | PRN

## 2019-07-07 NOTE — Progress Notes (Signed)
Virtual Visit via Video   I connected with patient on 07/07/19 at  3:30 PM EDT by a video enabled telemedicine application and verified that I am speaking with the correct person using two identifiers.  Location patient: Home Location provider: [facility] Office Persons participating in the virtual visit: Patient, Provider, [place] (Patina Moore)  I discussed the limitations of evaluation and management by telemedicine and the availability of in person appointments. The patient expressed understanding and agreed to proceed.  Subjective:   HPI:   Patient presents via [place] today to follow-up regarding insomnia.  At last visit patient was started on Belsomra 10 mg that was titrated up to 15 mg nightly due to subtherapeutic response.  Patient initially noted some improvement with 15 mg Alzheimer's sleeping throughout the night.  Notes this only lasted for a few nights and since then has only been sleeping a few hours per night, waking up every 30 minutes to an hour.  Notes significant daytime fatigue secondary to this for which she takes an occasional nap.  Tries to nap for 1 hour at most.  Notes mood is still doing well overall though.  Denies any new concerns at today's visit. ROS:   See pertinent positives and negatives per HPI.  Patient Active Problem List   Diagnosis Date Noted  . Osteopenia after menopause 06/13/2018  . Odynophagia   . COPD with acute exacerbation ([place]) 03/21/2017  . Insomnia 11/03/2016  . Asthma 01/06/2016  . Chronic respiratory failure ([place]) 05/21/2014  . Lung cancer, right upper lobe 04/24/2014  . S/P lobectomy of lung 04/20/2014  . Hypercholesteremia 04/06/2014  . Bilateral leg pain 10/14/2013  . Claudication ([place]) 08/20/2013  . Breast CA ([place]) 02/17/2013  . Invasive ductal carcinoma of right breast ([place]) 08/21/2012  . Tobacco abuse 08/21/2012  . S/P bilateral breast implants 08/21/2012  . HYPERLIPIDEMIA 04/29/2008  . Essential hypertension  04/29/2008  . GERD 04/29/2008    Social History   Tobacco Use  . Smoking status: Former Smoker    Packs/day: 1.00    Years: 40.00    Pack years: 40.00    Types: Cigarettes    Quit date: 04/20/2014    Years since quitting: 5.2  . Smokeless tobacco: Never Used  Substance Use Topics  . Alcohol use: Yes    Alcohol/week: 6.0 standard drinks    Types: 6 Glasses of wine per week    Current Outpatient Medications:  .  acetaminophen (TYLENOL) 500 MG tablet, Take 500 mg by mouth every 8 (eight) hours as needed., Disp: , Rfl:  .  albuterol (VENTOLIN HFA) 108 (90 Base) MCG/ACT inhaler, Inhale 2 puffs into the lungs every 6 (six) hours as needed for wheezing or shortness of breath., Disp: 6.7 g, Rfl: 5 .  atorvastatin (LIPITOR) 40 MG tablet, Take 40 mg by mouth daily., Disp: , Rfl:  .  budesonide-formoterol (SYMBICORT) 160-4.5 MCG/ACT inhaler, Inhale 2 puffs into the lungs 2 (two) times daily., Disp: 1 Inhaler, Rfl: 5 .  cholecalciferol (VITAMIN D3) 25 MCG (1000 UNIT) tablet, Take 1,000 Units by mouth daily., Disp: , Rfl:  .  gabapentin (NEURONTIN) 800 MG tablet, Take 800 mg by mouth 3 (three) times daily. , Disp: , Rfl:  .  hydrOXYzine (ATARAX/VISTARIL) 50 MG tablet, Take 50 mg by mouth at bedtime., Disp: , Rfl:  .  lisinopril (PRINIVIL,ZESTRIL) 20 MG tablet, Take 20 mg by mouth daily., Disp: , Rfl:  .  omeprazole (PRILOSEC) 40 MG capsule, Take 40 mg by  mouth daily. , Disp: , Rfl:  .  Suvorexant (BELSOMRA) 15 MG TABS, Take 15 mg by mouth at bedtime., Disp: 30 tablet, Rfl: 1 .  zinc gluconate 50 MG tablet, Take 50 mg by mouth daily., Disp: , Rfl:   Allergies  Allergen Reactions  . Hydrocodone Other (See Comments)    Makes her hyper    Objective:   There were no vitals taken for this visit.  Patient is well-developed, well-nourished in no acute distress.  Resting comfortably at home.  Head is normocephalic, atraumatic.  No labored breathing.  Speech is clear and coherent with logical  content.  Patient is alert and oriented at baseline.   Assessment and Plan:   1. Insomnia, unspecified type Substantial issue.  Belsomra 15 mg was helping but only for a few nights.  States since then she has had to add on her hydroxyzine with little added benefit.  Is napping during the day now because she only gets a couple of hours sleep per night, all interrupted.  Feels very tired but thankfully notes her mood has been stable overall.  Denies increased anxiety.  Is pushing through when trying to get her tasks done but this is becoming harder and harder.  Has previously been on trazodone, Lunesta, hydroxyzine and Belsomra.  Trying to avoid medications like temazepam due to her history of substance abuse in her 79s.  Will attempt trial of Ambien along with sleep hygiene practices.  If no improvement would need to consider trial of temazepam versus setting her up with a sleep specialist.    Leeanne Rio, PA-C 07/07/2019

## 2019-08-12 ENCOUNTER — Encounter: Payer: Self-pay | Admitting: Physician Assistant

## 2019-08-12 NOTE — Telephone Encounter (Signed)
I do not do combination sleep aids due to the risk of hypersomnolence. I would recommend we have a sleep specialist evaluate what the most appropriate next step is.

## 2019-08-15 ENCOUNTER — Telehealth: Payer: Self-pay | Admitting: Emergency Medicine

## 2019-08-15 DIAGNOSIS — G4700 Insomnia, unspecified: Secondary | ICD-10-CM

## 2019-08-15 NOTE — Telephone Encounter (Signed)
Ambien last rx 07/07/19 #15 1 RF LOV: 07/07/19 Insomnia Per last my chart message, patient stated the Ambien didn't help with sleep

## 2019-08-19 MED ORDER — ZOLPIDEM TARTRATE 10 MG PO TABS: 10.0000 mg | ORAL_TABLET | Freq: Every evening | ORAL | 1 refills | Status: DC | PRN

## 2019-08-19 NOTE — Telephone Encounter (Signed)
Pt called in checking the status on this refill, pt uses [facility] in [place]

## 2019-08-19 NOTE — Addendum Note (Signed)
Addended by: Brunetta Jeans on: 08/19/2019 01:53 PM   Modules accepted: Orders

## 2019-08-19 NOTE — Telephone Encounter (Signed)
Refill sent.

## 2019-10-01 ENCOUNTER — Other Ambulatory Visit: Payer: Self-pay | Admitting: Emergency Medicine

## 2019-10-01 ENCOUNTER — Encounter: Payer: Self-pay | Admitting: Physician Assistant

## 2019-10-01 DIAGNOSIS — C50911 Malignant neoplasm of unspecified site of right female breast: Secondary | ICD-10-CM

## 2019-10-01 DIAGNOSIS — K219 Gastro-esophageal reflux disease without esophagitis: Secondary | ICD-10-CM

## 2019-10-01 DIAGNOSIS — I10 Essential (primary) hypertension: Secondary | ICD-10-CM

## 2019-10-01 MED ORDER — OMEPRAZOLE 40 MG PO CPDR: 40.0000 mg | DELAYED_RELEASE_CAPSULE | Freq: Every day | ORAL | 1 refills | Status: DC

## 2019-10-01 MED ORDER — LISINOPRIL 20 MG PO TABS: 20.0000 mg | ORAL_TABLET | Freq: Every day | ORAL | 1 refills | Status: DC

## 2019-10-02 ENCOUNTER — Other Ambulatory Visit: Payer: Self-pay | Admitting: Physician Assistant

## 2019-10-02 MED ORDER — HYDROXYZINE HCL 50 MG PO TABS: 50.0000 mg | ORAL_TABLET | Freq: Every day | ORAL | 1 refills | Status: DC

## 2019-10-02 MED ORDER — ZOLPIDEM TARTRATE 10 MG PO TABS: 10.0000 mg | ORAL_TABLET | Freq: Every evening | ORAL | 1 refills | Status: DC | PRN

## 2019-11-05 ENCOUNTER — Ambulatory Visit (INDEPENDENT_AMBULATORY_CARE_PROVIDER_SITE_OTHER): Payer: 59 | Admitting: Pulmonary Disease

## 2019-11-05 ENCOUNTER — Encounter: Payer: Self-pay | Admitting: Pulmonary Disease

## 2019-11-05 ENCOUNTER — Other Ambulatory Visit: Payer: Self-pay

## 2019-11-05 VITALS — BP 162/82 | HR 88 | Temp 97.1°F | Ht 61.0 in | Wt 155.2 lb

## 2019-11-05 DIAGNOSIS — Z853 Personal history of malignant neoplasm of breast: Secondary | ICD-10-CM

## 2019-11-05 DIAGNOSIS — Z23 Encounter for immunization: Secondary | ICD-10-CM | POA: Diagnosis not present

## 2019-11-05 DIAGNOSIS — Z85118 Personal history of other malignant neoplasm of bronchus and lung: Secondary | ICD-10-CM

## 2019-11-05 DIAGNOSIS — J4520 Mild intermittent asthma, uncomplicated: Secondary | ICD-10-CM | POA: Diagnosis not present

## 2019-11-05 DIAGNOSIS — R0683 Snoring: Secondary | ICD-10-CM

## 2019-11-05 MED ORDER — BUDESONIDE-FORMOTEROL FUMARATE 160-4.5 MCG/ACT IN AERO: 2.0000 | INHALATION_SPRAY | Freq: Two times a day (BID) | RESPIRATORY_TRACT | 5 refills | Status: DC

## 2019-11-05 NOTE — Patient Instructions (Signed)
Will arrange for CT chest and home sleep study  Will arrange for referral to oncologist that accepts your insurance  Flu shot today  Will call to schedule follow up after testing is completed

## 2019-11-05 NOTE — Progress Notes (Signed)
 [facility]  Chief Complaint  Patient presents with  . Consult    Sleep consult    Constitutional:  BP (!) 162/82 (BP Location: Left Arm, Cuff Size: Normal)   Pulse 88   Temp (!) 97.1 F (36.2 C) (Other (Comment)) Comment (Src): wrist  Ht 5' 1" (1.549 m)   Wt 155 lb 3.2 oz (70.4 kg)   SpO2 96% Comment: room air  BMI 29.32 kg/m   Past Medical History:  Bipolar, depression, Chronic pain GERD, HLD, Orthostatic hypotension, ETOH, Rt breast cancer  Past Surgical History:  Her  has a past surgical history that includes Breast lumpectomy with sentinel lymph node bx (Right, 04/2012); Tonsillectomy; Video bronchoscopy (N/A, 04/20/2014); Video assisted thoracoscopy (vats)/wedge resection (Right, 04/20/2014); Wedge resection (Right, 04/20/2014); Lobectomy (Right, 04/20/2014); Node dissection (Right, 04/20/2014); Breast lumpectomy (Left, 1990); Esophagogastroduodenoscopy (egd) with propofol (N/A, 05/10/2017); Breast lumpectomy (Right); Vaginal hysterectomy; Placement of breast implants (1993); Plantar fascia release (Left); and Shoulder arthroscopy with subacromial decompression, rotator cuff repair and bicep tendon repair (Right, 06/27/2017).  Brief Summary:  Gabrielle [name] is a 64 y.o. female former smoker with wheezing and nocturnal hypoxia after RULectomy in March 2016 for NSCLC complicated by steroid responsive pulmonary infiltrates.      Subjective:   She presents today to assess trouble staying asleep.  She has lots of stress in her life.  She has been taking ambien and hydroxyzine at bed time.  She goes to bed at 10 pm and feels sleepy then.  She falls asleep quickly.  She then wakes up every couple hours throughout the night.  She snores, and has to use the bathroom frequently at night.  She can fall back to sleep.  She gets out of bed at 7 am and feels tired.  She doesn't nap, but can fall asleep in the evening when she is sitting quiet.  She  was told that she grinds her teeth.  Epworth score is 3 out of 24.  She is still using 2 liters oxygen at night.  She has noticed more chest tightness with change of seasons.  Has been using symbicort on daily basis for past couple of weeks and this has helped.  She hasn't been able to follow up with her oncologist due to insurance coverage change.  As a result she missed her monitoring CT scan from last year.  She has been on her feet more for the past couple of weeks.  She was getting swelling and soreness in her legs.  This improved after she was able to keep her feet up.  Physical Exam:   Appearance - well kempt   ENMT - no sinus tenderness, no oral exudate, no LAN, Mallampati 4 airway, no stridor, poor dentition  Respiratory - equal breath sounds bilaterally, no wheezing or rales  CV - s1s2 regular rate and rhythm, no murmurs  Ext - no clubbing, no edema  Skin - no rashes  Psych - normal mood and affect   Pulmonary testing:   PFT 04/07/14 >> FEV1 1.91 (85%), FEV1% 73, TLC 4.22 (98%), DLCO 56%  Labs 05/06/15 >> CPK 59, RF < 10, ANA negative, ESR 23  PFT 05/21/15 >> FEV1 1.96 (83%), FEV1% 73, FEF 25-75% 1.56 (68%), TLC 4.46 (96%), DLCO 55%, +BD from FEF 25-75%  FeNO 12/03/15 >> 5  PFT 05/16/17 >> FEV1 1.93 (84%), FEV1% 74, TLC 4.16 (90%), DLCO 71%, no BD  Chest Imaging:   PET scan 04/03/14 >>   hypermetabolic RUL nodule 1.4 x 1.3 cm  Sleep Tests:   ONO with RA 07/24/14 >>test time 9 hrs 57 min. Mean SpO2 91.8%, low SpO2 82%. Spent 1 hr 59 min with SpO2 <88%.  ONO on RA 06/01/15 >>test time 7 hrs 50 min. Mean SpO2 90.07%, low SpO2 67%. Spent 2 hrs 24 min with SpO2 <88%. She needs to continue using supplemental oxygen at night  ONO with RA 05/30/17 >>test time 4 hrs 44 min. Average SpO2 90%, low SpO2 78%. Spent 1 hr 7 min with SpO2 <88%.  Cardiac Tests:   Echo 04/28/14 >> EF 55 to 60%, grade 1 diastolic dysfx  Social History:  She  reports that she quit smoking  about 5 years ago. Her smoking use included cigarettes. She has a 40.00 pack-year smoking history. She has never used smokeless tobacco. She reports current alcohol use of about 6.0 standard drinks of alcohol per week. She reports that she does not use drugs.  Family History:  Her family history includes Hypertension in her father and mother; Leukemia in her maternal grandmother; Lymphoma (age of onset: 77) in her maternal aunt.     Assessment/Plan:   Sleep maintenance insomnia with snoring, apnea, and daytime sleepiness. - I am concerned she could have obstructive sleep apnea contributing to her difficulties with sleep maintenance - will arrange for home sleep study to further assess - okay to continue using ambien and hydroxyzine at night for now, but will defer additional sleep aide medication until her sleep study has been reviewed  Persistent asthmaand mild emphysema changes on CT chest. - continue symbicort daily during Fall season - influenza vaccine today  Nocturnal hypoxemia. - continue 2 liters oxygen at night for now pending results of her home sleep study  History of breast cancer and non small cell lung cancer. - will see if we can arrange for referral to oncologist that accepts her insurance for continue surveillance  - will arrange for CT chest w/o contrast   Time Spent Involved in Patient Care on Day of Examination:  42 minutes  Follow up:  Patient Instructions  Will arrange for CT chest and home sleep study  Will arrange for referral to oncologist that accepts your insurance  Flu shot today  Will call to schedule follow up after testing is completed    Medication List:   Allergies as of 11/05/2019      Reactions   Hydrocodone Other (See Comments)   Makes her hyper      Medication List       Accurate as of November 05, 2019 11:43 AM. If you have any questions, ask your nurse or doctor.        albuterol 108 (90 Base) MCG/ACT inhaler Commonly  known as: VENTOLIN HFA Inhale 2 puffs into the lungs every 6 (six) hours as needed for wheezing or shortness of breath.   atorvastatin 40 MG tablet Commonly known as: LIPITOR Take 40 mg by mouth daily.   budesonide-formoterol 160-4.5 MCG/ACT inhaler Commonly known as: SYMBICORT Inhale 2 puffs into the lungs 2 (two) times daily.   cholecalciferol 25 MCG (1000 UNIT) tablet Commonly known as: VITAMIN D3 Take 1,000 Units by mouth daily.   gabapentin 800 MG tablet Commonly known as: NEURONTIN Take 800 mg by mouth 3 (three) times daily.   hydrOXYzine 50 MG tablet Commonly known as: ATARAX/VISTARIL Take 1 tablet (50 mg total) by mouth at bedtime.   lisinopril 20 MG tablet Commonly known as: ZESTRIL Take 1   tablet (20 mg total) by mouth daily.   multivitamin with minerals tablet Take 1 tablet by mouth daily.   omeprazole 40 MG capsule Commonly known as: PRILOSEC Take 1 capsule (40 mg total) by mouth daily.   TYLENOL 500 MG tablet Generic drug: acetaminophen Take 500 mg by mouth every 8 (eight) hours as needed.   vitamin C 100 MG tablet Take 100 mg by mouth daily.   zinc gluconate 50 MG tablet Take 50 mg by mouth daily.   zolpidem 10 MG tablet Commonly known as: AMBIEN Take 1 tablet (10 mg total) by mouth at bedtime as needed for sleep.       Signature:  Chesley Mires, MD [place] Pager - [phone] 11/05/2019, 11:43 AM

## 2019-11-07 ENCOUNTER — Ambulatory Visit: Payer: 59 | Admitting: Physician Assistant

## 2019-11-13 ENCOUNTER — Ambulatory Visit (HOSPITAL_COMMUNITY)
Admission: RE | Admit: 2019-11-13 | Discharge: 2019-11-13 | Disposition: A | Discharge status: Home / Self Care | Payer: 59 | Source: Ambulatory Visit | Attending: Pulmonary Disease | Admitting: Pulmonary Disease

## 2019-11-13 ENCOUNTER — Other Ambulatory Visit: Payer: Self-pay

## 2019-11-13 DIAGNOSIS — Z85118 Personal history of other malignant neoplasm of bronchus and lung: Secondary | ICD-10-CM | POA: Diagnosis present

## 2019-11-18 ENCOUNTER — Encounter (HOSPITAL_COMMUNITY): Payer: Self-pay

## 2019-11-18 NOTE — Progress Notes (Signed)
I have attempted to contact this patient today for an introductory phone call. Unable to reach patient or leave a VM at this time. I will plan to meet with patient during initial visit with Dr. Delton Coombes.

## 2019-11-19 ENCOUNTER — Telehealth: Payer: Self-pay | Admitting: Pulmonary Disease

## 2019-11-19 DIAGNOSIS — J441 Chronic obstructive pulmonary disease with (acute) exacerbation: Secondary | ICD-10-CM

## 2019-11-19 NOTE — Addendum Note (Signed)
Addended by: Rosana Berger on: 11/19/2019 05:38 PM   Modules accepted: Orders

## 2019-11-19 NOTE — Telephone Encounter (Signed)
Spoke with the pt and notified of recs per SG  She verbalized understanding  Appt scheduled

## 2019-11-19 NOTE — Telephone Encounter (Signed)
Spoke with pt. States that she is not feeling well. Reports headache, ear ache, DOE, cough, PND and chills. Cough is non productive at this time. Denies fever, wheezing or chest tightness. Was tested for COVID and got her negative results yesterday. Symptoms started 10-11 days ago. Has been using her inhalers, Tessalon and an OTC antihistamine with minimal relief. Pt is requesting to have a "steriod."  Dr. Halford Chessman - please advise. Thanks!

## 2019-11-19 NOTE — Telephone Encounter (Signed)
Needs CXR and OV tomorrow. If she can't wait that long needs to see emergency care. Thanks

## 2019-11-19 NOTE — Telephone Encounter (Signed)
Forwarding to APP of the day  Dr Halford Chessman paged and no response

## 2019-11-20 ENCOUNTER — Inpatient Hospital Stay (HOSPITAL_COMMUNITY): Payer: 59 | Attending: Hematology | Admitting: Hematology

## 2019-11-20 ENCOUNTER — Encounter: Payer: Self-pay | Admitting: Adult Health

## 2019-11-20 ENCOUNTER — Ambulatory Visit (INDEPENDENT_AMBULATORY_CARE_PROVIDER_SITE_OTHER): Payer: 59 | Admitting: Adult Health

## 2019-11-20 ENCOUNTER — Other Ambulatory Visit: Payer: Self-pay

## 2019-11-20 ENCOUNTER — Ambulatory Visit (INDEPENDENT_AMBULATORY_CARE_PROVIDER_SITE_OTHER): Payer: 59

## 2019-11-20 ENCOUNTER — Inpatient Hospital Stay (HOSPITAL_COMMUNITY): Payer: 59

## 2019-11-20 VITALS — BP 129/77 | HR 94 | Temp 97.1°F | Resp 18 | Ht 61.0 in | Wt 157.4 lb

## 2019-11-20 DIAGNOSIS — C50911 Malignant neoplasm of unspecified site of right female breast: Secondary | ICD-10-CM

## 2019-11-20 DIAGNOSIS — J441 Chronic obstructive pulmonary disease with (acute) exacerbation: Secondary | ICD-10-CM | POA: Diagnosis not present

## 2019-11-20 DIAGNOSIS — C3411 Malignant neoplasm of upper lobe, right bronchus or lung: Secondary | ICD-10-CM | POA: Diagnosis present

## 2019-11-20 DIAGNOSIS — Z9882 Breast implant status: Secondary | ICD-10-CM | POA: Insufficient documentation

## 2019-11-20 DIAGNOSIS — Z79899 Other long term (current) drug therapy: Secondary | ICD-10-CM | POA: Insufficient documentation

## 2019-11-20 DIAGNOSIS — Z923 Personal history of irradiation: Secondary | ICD-10-CM | POA: Insufficient documentation

## 2019-11-20 DIAGNOSIS — M8580 Other specified disorders of bone density and structure, unspecified site: Secondary | ICD-10-CM | POA: Insufficient documentation

## 2019-11-20 DIAGNOSIS — Z853 Personal history of malignant neoplasm of breast: Secondary | ICD-10-CM | POA: Diagnosis not present

## 2019-11-20 DIAGNOSIS — Z170 Estrogen receptor positive status [ER+]: Secondary | ICD-10-CM | POA: Diagnosis not present

## 2019-11-20 DIAGNOSIS — Z9223 Personal history of estrogen therapy: Secondary | ICD-10-CM | POA: Diagnosis not present

## 2019-11-20 LAB — CBC WITH DIFFERENTIAL/PLATELET
Abs Immature Granulocytes: 0.04 10*3/uL (ref 0.00–0.07)
Basophils Absolute: 0 10*3/uL (ref 0.0–0.1)
Basophils Relative: 0 %
Eosinophils Absolute: 0.1 10*3/uL (ref 0.0–0.5)
Eosinophils Relative: 0 %
HCT: 36.3 % (ref 36.0–46.0)
Hemoglobin: 12.5 g/dL (ref 12.0–15.0)
Immature Granulocytes: 0 %
Lymphocytes Relative: 18 %
Lymphs Abs: 2.5 10*3/uL (ref 0.7–4.0)
MCH: 32.4 pg (ref 26.0–34.0)
MCHC: 34.4 g/dL (ref 30.0–36.0)
MCV: 94 fL (ref 80.0–100.0)
Monocytes Absolute: 1 10*3/uL (ref 0.1–1.0)
Monocytes Relative: 7 %
Neutro Abs: 10 10*3/uL — ABNORMAL HIGH (ref 1.7–7.7)
Neutrophils Relative %: 75 %
Platelets: 258 10*3/uL (ref 150–400)
RBC: 3.86 MIL/uL — ABNORMAL LOW (ref 3.87–5.11)
RDW: 13.8 % (ref 11.5–15.5)
WBC: 13.6 10*3/uL — ABNORMAL HIGH (ref 4.0–10.5)
nRBC: 0 % (ref 0.0–0.2)

## 2019-11-20 LAB — COMPREHENSIVE METABOLIC PANEL
ALT: 26 U/L (ref 0–44)
AST: 27 U/L (ref 15–41)
Albumin: 4.2 g/dL (ref 3.5–5.0)
Alkaline Phosphatase: 88 U/L (ref 38–126)
Anion gap: 13 (ref 5–15)
BUN: 8 mg/dL (ref 8–23)
CO2: 26 mmol/L (ref 22–32)
Calcium: 9.4 mg/dL (ref 8.9–10.3)
Chloride: 89 mmol/L — ABNORMAL LOW (ref 98–111)
Creatinine, Ser: 0.63 mg/dL (ref 0.44–1.00)
GFR, Estimated: >60 mL/min (ref >60)
Glucose, Bld: 92 mg/dL (ref 70–99)
Potassium: 3.7 mmol/L (ref 3.5–5.1)
Sodium: 128 mmol/L — ABNORMAL LOW (ref 135–145)
Total Bilirubin: 0.7 mg/dL (ref 0.3–1.2)
Total Protein: 7.5 g/dL (ref 6.5–8.1)

## 2019-11-20 LAB — VITAMIN D 25 HYDROXY (VIT D DEFICIENCY, FRACTURES): Vit D, 25-Hydroxy: 32.56 ng/mL (ref 30–100)

## 2019-11-20 MED ORDER — PREDNISONE 10 MG PO TABS: ORAL_TABLET | ORAL | 0 refills | Status: DC

## 2019-11-20 MED ORDER — AMOXICILLIN-POT CLAVULANATE 875-125 MG PO TABS: 1.0000 | ORAL_TABLET | Freq: Two times a day (BID) | ORAL | 0 refills | Status: AC

## 2019-11-20 NOTE — Progress Notes (Signed)
[facility] [address], [place] [address]   CLINIC:  Medical Oncology/Hematology  CONSULT NOTE  Patient Care Team: Delorse Limber as PCP - General (Family Medicine) Everardo All, MD as Consulting Physician (Oncology) Chesley Mires, MD as Consulting Physician (Pulmonary Disease) Willia Craze, NP as Nurse Practitioner (Gastroenterology) Sandrea Hammond Gregary Signs, MD as Referring Physician (Oncology) Dishmon, Garwin Brothers, RN as Oncology Nurse Navigator (Oncology)  CHIEF COMPLAINTS/PURPOSE OF CONSULTATION:  Evaluation of stage I right breast cancer and right lung adenocarcinoma  HISTORY OF PRESENTING ILLNESS:  Gabrielle [name] 64 y.o. female is here because of stage I right breast cancer and right lung adenocarcinoma, at the request of Dr. Chesley Mires from [facility]. She was last seen by Dr. Ancil Linsey on 02/07/2016.  Today she reports feeling poorly due to sinus infection for the past 2 weeks. She reports developing SOB, chest pressure, rhinorrhea with thick yellow mucus, tachycardia with exertion and post-nasal drip for the past 2 days and tested negative for COVID. She was diagnosed with breast cancer in 2014; she took Arimidex for 5 years. She has been doing all of her mammograms at [facility] in [place]. She had implants placed and the one of the right is encapsulated. She denies having any new pains with the exception of carpal tunnel-associated numbness in her hands and denies any recent changes in her overall health; she lost 15 lbs over the past 2 years. She developed chills yesterday but denies fever at home. She reports having wheezing and has inhalers, but reports that they are not working.  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 She quit smoking on the day she was diagnosed with lung cancer. She is sedentary at home, though she recently bought an electric bike and has started cycling. Her father had prostate  cancer.  MEDICAL HISTORY:  Past Medical History:  Diagnosis Date  . Anemia   . Arthritis   . Asthma   . Breast cancer ([place]) 04/2012   right  . Carpal tunnel syndrome    bil  . Chronic pain in left foot   . Cough   . Depression    espisodic  . Diarrhea   . Dyspnea    with exertion an have failed over night oximenrty - wears oxygen at hs  . GERD (gastroesophageal reflux disease)   . H/O eating disorder   . History of anemia   . Hyperlipidemia   . Invasive ductal carcinoma of right breast ([place]) 08/21/2012  . Legally blind   . Lung cancer ([place])    right  . Orthostatic hypotension   . Pneumonia    03-13-17  . S/P bilateral breast implants 08/21/2012   Prior to cancer diagnosis  . Tobacco abuse 08/21/2012   > 40 pack years    SURGICAL HISTORY: Past Surgical History:  Procedure Laterality Date  . BREAST LUMPECTOMY Left 1990   [facility] / right was cancer  . BREAST LUMPECTOMY Right   . BREAST LUMPECTOMY WITH SENTINEL LYMPH NODE BIOPSY Right 04/2012  . ESOPHAGOGASTRODUODENOSCOPY (EGD) WITH PROPOFOL N/A 05/10/2017   Procedure: ESOPHAGOGASTRODUODENOSCOPY (EGD) WITH PROPOFOL;  Surgeon: Milus Banister, MD;  Location: [facility];  Service: Endoscopy;  Laterality: N/A;  . LOBECTOMY Right 04/20/2014   Procedure: LOBECTOMY, RIGHT UPPER LOBE WITH PLACEMENT OF ONQ PAIN PUMP;  Surgeon: Grace Isaac, MD;  Location: [facility];  Service: Thoracic;  Laterality: Right;  . NODE DISSECTION Right 04/20/2014  Procedure: NODE DISSECTION;  Surgeon: Grace Isaac, MD;  Location: [facility];  Service: Thoracic;  Laterality: Right;  . PLACEMENT OF BREAST IMPLANTS  1993  . PLANTAR FASCIA RELEASE Left   . SHOULDER ARTHROSCOPY WITH SUBACROMIAL DECOMPRESSION, ROTATOR CUFF REPAIR AND BICEP TENDON REPAIR Right 06/27/2017   Procedure: RIGHT SHOULDER ARTHROSCOPY WITH DEBRIDEMENT, SUBACROMIAL DECOMPRESSION AND ROTATOR CUFF REPAIR, BICEP TENODESIS;  Surgeon: Hiram Gash, MD;  Location: [facility];  Service: Orthopedics;   Laterality: Right;  . TONSILLECTOMY    . VAGINAL HYSTERECTOMY    . VIDEO ASSISTED THORACOSCOPY (VATS)/WEDGE RESECTION Right 04/20/2014   Procedure: VIDEO ASSISTED THORACOSCOPY ;  Surgeon: Grace Isaac, MD;  Location: [facility];  Service: Thoracic;  Laterality: Right;  Marland Kitchen VIDEO BRONCHOSCOPY N/A 04/20/2014   Procedure: VIDEO BRONCHOSCOPY;  Surgeon: Grace Isaac, MD;  Location: [facility];  Service: Thoracic;  Laterality: N/A;  . WEDGE RESECTION Right 04/20/2014   Procedure: WEDGE RESECTION, RIGHT UPPER LOBE;  Surgeon: Grace Isaac, MD;  Location: [facility];  Service: Thoracic;  Laterality: Right;    SOCIAL HISTORY: Social History   Socioeconomic History  . Marital status: Divorced    Spouse name: Not on file  . Number of children: 1  . Years of education: Not on file  . Highest education level: Not on file  Occupational History    Employer: [employer]  Tobacco Use  . Smoking status: Former Smoker    Packs/day: 1.00    Years: 40.00    Pack years: 40.00    Types: Cigarettes    Quit date: 04/20/2014    Years since quitting: 5.5  . Smokeless tobacco: Never Used  Vaping Use  . Vaping Use: Never used  Substance and Sexual Activity  . Alcohol use: Yes    Alcohol/week: 6.0 standard drinks    Types: 6 Glasses of wine per week  . Drug use: No    Comment: history of substance abuse - none now-   . Sexual activity: Never  Other Topics Concern  . Not on file  Social History Narrative  . Not on file   Social Determinants of Health   Financial Resource Strain:   . Difficulty of Paying Living Expenses: Not on file  Food Insecurity:   . Worried About Charity fundraiser in the Last Year: Not on file  . Ran Out of Food in the Last Year: Not on file  Transportation Needs:   . Lack of Transportation (Medical): Not on file  . Lack of Transportation (Non-Medical): Not on file  Physical Activity:   . Days of Exercise per Week: Not on file  . Minutes of Exercise per  Session: Not on file  Stress:   . Feeling of Stress : Not on file  Social Connections:   . Frequency of Communication with Friends and Family: Not on file  . Frequency of Social Gatherings with Friends and Family: Not on file  . Attends Religious Services: Not on file  . Active Member of Clubs or Organizations: Not on file  . Attends Archivist Meetings: Not on file  . Marital Status: Not on file  Intimate Partner Violence:   . Fear of Current or Ex-Partner: Not on file  . Emotionally Abused: Not on file  . Physically Abused: Not on file  . Sexually Abused: Not on file    FAMILY HISTORY: Family History  Problem Relation Age of Onset  . Hypertension Mother   . Hypertension Father   .  Lymphoma Maternal Aunt 77  . Leukemia Maternal Grandmother        dx in her late 24s    ALLERGIES:  is allergic to hydrocodone.  MEDICATIONS:  Current Outpatient Medications  Medication Sig Dispense Refill  . acetaminophen (TYLENOL) 500 MG tablet Take 500 mg by mouth every 8 (eight) hours as needed.    Marland Kitchen albuterol (VENTOLIN HFA) 108 (90 Base) MCG/ACT inhaler Inhale 2 puffs into the lungs every 6 (six) hours as needed for wheezing or shortness of breath. 6.7 g 5  . Ascorbic Acid (VITAMIN C) 100 MG tablet Take 100 mg by mouth daily.    Marland Kitchen atorvastatin (LIPITOR) 40 MG tablet Take 40 mg by mouth daily.    . budesonide-formoterol (SYMBICORT) 160-4.5 MCG/ACT inhaler Inhale 2 puffs into the lungs 2 (two) times daily. 10.2 g 5  . cholecalciferol (VITAMIN D3) 25 MCG (1000 UNIT) tablet Take 1,000 Units by mouth daily.    Marland Kitchen gabapentin (NEURONTIN) 800 MG tablet Take 800 mg by mouth 3 (three) times daily.     . hydrOXYzine (ATARAX/VISTARIL) 50 MG tablet Take 1 tablet (50 mg total) by mouth at bedtime. 30 tablet 1  . lisinopril (ZESTRIL) 20 MG tablet Take 1 tablet (20 mg total) by mouth daily. 90 tablet 1  . Multiple Vitamins-Minerals (MULTIVITAMIN WITH MINERALS) tablet Take 1 tablet by mouth daily.     Marland Kitchen omeprazole (PRILOSEC) 40 MG capsule Take 1 capsule (40 mg total) by mouth daily. 90 capsule 1  . zinc gluconate 50 MG tablet Take 50 mg by mouth daily.    Marland Kitchen zolpidem (AMBIEN) 10 MG tablet Take 1 tablet (10 mg total) by mouth at bedtime as needed for sleep. 30 tablet 1   No current facility-administered medications for this visit.    REVIEW OF SYSTEMS:   Review of Systems  Constitutional: Positive for appetite change (60%), chills (since 10/27) and fatigue (50%). Negative for fever. Unexpected weight change: 15 lbs over 2 years.  Respiratory: Positive for chest tightness, cough (w/ thich yellow sputum), shortness of breath and wheezing.   Cardiovascular: Positive for palpitations (w/ exertion).  Neurological: Positive for headaches and numbness (carpal tunnel in hands).  Psychiatric/Behavioral: The patient is nervous/anxious.   All other systems reviewed and are negative.    PHYSICAL EXAMINATION: ECOG PERFORMANCE STATUS: 1 - Symptomatic but completely ambulatory  Vitals:   11/20/19 0801  BP: 129/77  Pulse: 94  Resp: 18  Temp: (!) 97.1 F (36.2 C)  SpO2: 92%   Filed Weights   11/20/19 0801  Weight: 157 lb 6.4 oz (71.4 kg)   Physical Exam Vitals reviewed.  Constitutional:      Appearance: Normal appearance.  Cardiovascular:     Rate and Rhythm: Normal rate and regular rhythm.     Pulses: Normal pulses.     Heart sounds: Normal heart sounds.  Pulmonary:     Effort: Pulmonary effort is normal.     Breath sounds: Examination of the right-upper field reveals wheezing. Examination of the left-upper field reveals wheezing. Examination of the right-middle field reveals wheezing. Examination of the left-middle field reveals wheezing. Examination of the right-lower field reveals wheezing. Examination of the left-lower field reveals wheezing. Wheezing present.  Chest:     Breasts:        Right: Skin change (upper inner lumpectomy scar well-healed) present. No swelling, mass or  tenderness.        Left: Skin change (lumpectomy peri-areolar well-healed) present. No swelling, mass or tenderness.  Comments: Bilateral breast implant Neurological:     General: No focal deficit present.     Mental Status: She is alert and oriented to person, place, and time.  Psychiatric:        Mood and Affect: Mood normal.        Behavior: Behavior normal.      LABORATORY DATA:  I have reviewed the data as listed CBC Latest Ref Rng & Units 10/16/2017 06/26/2017 02/08/2016  WBC 4.0 - 10.5 K/uL 7.2 8.9 6.3  Hemoglobin 12.0 - 15.0 g/dL 12.2 13.5 11.9(L)  Hematocrit 36 - 46 % 35.4(L) 39.3 34.7(L)  Platelets 150 - 400 K/uL 268.0 313 269   CMP Latest Ref Rng & Units 04/02/2019 10/16/2017 06/26/2017  Glucose 70 - 99 mg/dL 87 88 118(H)  BUN 6 - 23 mg/dL 10 9 15   Creatinine 0.40 - 1.20 mg/dL 0.65 0.63 0.81  Sodium 135 - 145 mEq/L 133(L) 136 137  Potassium 3.5 - 5.1 mEq/L 4.1 4.0 4.0  Chloride 96 - 112 mEq/L 97 99 99(L)  CO2 19 - 32 mEq/L 28 28 25   Calcium 8.4 - 10.5 mg/dL 9.8 9.3 9.9  Total Protein 6.0 - 8.3 g/dL 6.4 6.5 -  Total Bilirubin 0.2 - 1.2 mg/dL 0.5 0.4 -  Alkaline Phos 39 - 117 U/L 89 84 -  AST 0 - 37 U/L 19 31 -  ALT 0 - 35 U/L 19 31 -   Surgical pathology (Accession: [id number]) on 04/20/2014: Right upper lobe wedge resection: invasive well to moderately differentiated adenocarcinoma, lepidic predominant.  RADIOGRAPHIC STUDIES: I have personally reviewed the radiological images as listed and agreed with the findings in the report. CT Chest Wo Contrast  Addendum Date: 11/14/2019   ADDENDUM REPORT: 11/14/2019 13:49 ADDENDUM: Low-density lesion in the interpolar RIGHT kidney is incompletely imaged on the RIGHT but appears stable with respect to imaged portions where was compatible with a small cyst. Examination from June of 2020 was made available after initial review. Electronically Signed   By: Zetta Bills M.D.   On: 11/14/2019 13:49   Result Date:  11/14/2019 CLINICAL DATA:  Non-small cell lung cancer, follow-up evaluation also with history of breast cancer. EXAM: CT CHEST WITHOUT CONTRAST TECHNIQUE: Multidetector CT imaging of the chest was performed following the standard protocol without IV contrast. COMPARISON:  01/11/2016 FINDINGS: Cardiovascular: Calcified atheromatous plaque of the thoracic aorta. Three-vessel coronary artery disease. Normal heart size without pericardial effusion. Normal caliber central pulmonary vasculature. Limited assessment of cardiovascular structures given lack of intravenous contrast. Mediastinum/Nodes: No axillary lymphadenopathy. No thoracic inlet lymphadenopathy. No mediastinal or gross hilar adenopathy. Esophagus grossly normal. Lungs/Pleura: Tiny perifissural nodule in the LEFT upper chest along the major fissure likely intrapulmonary lymph node, not changed since December of 2017. No consolidation. No pleural effusion. Postoperative changes related to RIGHT upper lobectomy are stable. Mild pulmonary emphysema worse at the lung apex on the LEFT as before. Upper Abdomen: Imaged portions of the liver, gallbladder, pancreas, spleen and adrenal glands without acute process. The adrenal glands are normal. Low-density lesions arising from the upper pole of the LEFT kidney, grossly stable. Kidneys are incompletely imaged. Small low-density area in the upper pole the RIGHT kidney incompletely imaged with water density in imaged portions. This area was not present on the previous imaging study and not seen on a prior ultrasound, likely a cyst but incompletely imaged on today's evaluation. Musculoskeletal: Bilateral breast implants with peripheral calcification with similar appearance to the prior study. Contour irregularities of these  breast implants as before. Spinal degenerative changes. IMPRESSION: 1. Stable postoperative changes related to RIGHT upper lobectomy. 2. No evidence of recurrent or metastatic disease. 3. Three-vessel  coronary artery disease. 4. Small low-density area in the upper pole the RIGHT kidney incompletely imaged with water density in imaged portions. This area was not present on the previous imaging study and not seen on a prior ultrasound, likely a cyst but incompletely imaged on today's evaluation. Consider renal ultrasound on follow-up for further assessment. 5. Emphysema and aortic atherosclerosis. Aortic Atherosclerosis (ICD10-I70.0) and Emphysema (ICD10-J43.9). Electronically Signed: By: Zetta Bills M.D. On: 11/14/2019 13:32    ASSESSMENT:  1.  Stage I, grade 1 ER positive, HER-2 negative right breast cancer: -Lumpectomy and SLNB on 05/03/2012.  Status post XRT. -Anastrozole completed in June 2019. -Last mammogram was in 2019 at [facility] in [place].  2.  Stage Ia (T1b N0 M0) right upper lobe lung adenocarcinoma: -Video bronchoscopy, left video-assisted thoracoscopy on 04/20/2014 with wedge resection of the right upper lobe lung nodule and a right upper lobectomy with lymph node dissection.  Pathology with negative margins and negative LVI. -CT chest without contrast on 11/13/2019 showed stable postoperative changes of right upper lobectomy with no evidence of recurrence or metastatic disease.  Upper pole of right kidney cyst stable from prior imaging.  3.  Social/family history: -She works as a Surveyor, mining.  She quit smoking in 2016. -Father had prostate cancer.    PLAN:  1.  Stage I right breast cancer: -Today's examination did not reveal any palpable masses.  Lumpectomy scar on the right breast upper quadrant above the nipple is within normal limits.  Bilateral implants present.  Left breast lumpectomy for benign lesion around the areola is also stable.  No palpable adenopathy. -I have recommended mammogram.  As per patient request, we will make a referral to [facility] in [place]. -Recommend follow-up in 6 months.  2.  Right upper lobe lung cancer: -She does report  upper respiratory infection symptoms today with yellowish thick sputum mucus and cough. -She is going to see pulmonary later today. -I have reviewed results of CT scan of the chest which did not show any evidence of recurrence. -She will need another CT scan in a year.  3.  Osteopenia: -DEXA scan on 02/11/2016 shows T score -2.0. -We will consider repeating it in the future.   Derek Jack, MD, 11/20/19 8:24 AM  [place] [phone]   I, Milinda Antis, am acting as a scribe for Dr. Sanda Linger.  I, Derek Jack MD, have reviewed the above documentation for accuracy and completeness, and I agree with the above.

## 2019-11-20 NOTE — Progress Notes (Signed)
@Patient  ID: Gabrielle [name], female    DOB: 10-Nov-1955, 64 y.o.   MRN: [id number]  Chief Complaint  Patient presents with  . Acute Visit    COPD     Referring provider: Delorse [name]  HPI:  64 year old female former smoker followed for COPD/emphysema and asthma Previous non-small cell lung cancer- s/p RUL Lobectomy (no chemo/radiation)  complicated by steroid responsive pulmonary infiltrates.    TEST/EVENTS :   PFT 04/07/14 >> FEV1 1.91 (85%), FEV1% 73, TLC 4.22 (98%), DLCO 56%  Labs 05/06/15 >> CPK 59, RF < 10, ANA negative, ESR 23  PFT 05/21/15 >> FEV1 1.96 (83%), FEV1% 73, FEF 25-75% 1.56 (68%), TLC 4.46 (96%), DLCO 55%, +BD from FEF 25-75%  FeNO 12/03/15 >> 5  PFT 05/16/17 >> FEV1 1.93 (84%), FEV1% 74, TLC 4.16 (90%), DLCO 71%, no BD  Chest Imaging:   PET scan 0/09/38 >> hypermetabolic RUL nodule 1.4 x 1.3 cm   11/20/2019 Acute OV : Cough  Patient presents for an acute office visit.  She complains over the 2 weeks she has had increased sinus congestion and discharge.  Starting yesterday she had increased cough low-grade fever thick mucus but difficult to get up chills general malaise.  She did get Covid testing yesterday which is reported as negative.  Her Covid vaccines are up-to-date x2.   Chest x-ray today is clear with no acute process.  She denies any hemoptysis, chest pain, orthopnea.  Appetite is slightly decreased.  No nausea vomiting diarrhea.   Patient is supposed to be on Symbicort but says she has not been taking on a regular basis.  No albuterol use.  No recent antibiotic use.     Allergies  Allergen Reactions  . Hydrocodone Other (See Comments)    Makes her hyper    Immunization History  Administered Date(s) Administered  . Influenza Inj Mdck Quad Pf 10/25/2018  . Influenza Split 09/23/2013  . Influenza,inj,Quad PF,6+ Mos 11/05/2014, 12/31/2015, 11/05/2019  . Influenza-Unspecified 10/22/2016, 10/25/2018  . PFIZER SARS-COV-2  Vaccination 05/02/2019, 06/03/2019  . Pneumococcal Polysaccharide-23 05/24/2015  . Tdap 04/11/2008    Past Medical History:  Diagnosis Date  . Anemia   . Arthritis   . Asthma   . Breast cancer ([place]) 04/2012   right  . Carpal tunnel syndrome    bil  . Chronic pain in left foot   . Cough   . Depression    espisodic  . Diarrhea   . Dyspnea    with exertion an have failed over night oximenrty - wears oxygen at hs  . GERD (gastroesophageal reflux disease)   . H/O eating disorder   . History of anemia   . Hyperlipidemia   . Invasive ductal carcinoma of right breast ([place]) 08/21/2012  . Legally blind   . Lung cancer ([place])    right  . Orthostatic hypotension   . Pneumonia    03-13-17  . S/P bilateral breast implants 08/21/2012   Prior to cancer diagnosis  . Tobacco abuse 08/21/2012   > 40 pack years    Tobacco History: Social History   Tobacco Use  Smoking Status Former Smoker  . Packs/day: 1.00  . Years: 40.00  . Pack years: 40.00  . Types: Cigarettes  . Quit date: 04/20/2014  . Years since quitting: 5.5  Smokeless Tobacco Never Used   Counseling given: Not Answered   Outpatient Medications Prior to Visit  Medication Sig Dispense Refill  . acetaminophen (TYLENOL) 500 MG tablet  Take 500 mg by mouth every 8 (eight) hours as needed.    Marland Kitchen albuterol (VENTOLIN HFA) 108 (90 Base) MCG/ACT inhaler Inhale 2 puffs into the lungs every 6 (six) hours as needed for wheezing or shortness of breath. 6.7 g 5  . Ascorbic Acid (VITAMIN C) 100 MG tablet Take 100 mg by mouth daily.    Marland Kitchen atorvastatin (LIPITOR) 40 MG tablet Take 40 mg by mouth daily.    . budesonide-formoterol (SYMBICORT) 160-4.5 MCG/ACT inhaler Inhale 2 puffs into the lungs 2 (two) times daily. 10.2 g 5  . cholecalciferol (VITAMIN D3) 25 MCG (1000 UNIT) tablet Take 1,000 Units by mouth daily.    Marland Kitchen gabapentin (NEURONTIN) 800 MG tablet Take 800 mg by mouth 3 (three) times daily.     . hydrOXYzine (ATARAX/VISTARIL) 50 MG  tablet Take 1 tablet (50 mg total) by mouth at bedtime. 30 tablet 1  . lisinopril (ZESTRIL) 20 MG tablet Take 1 tablet (20 mg total) by mouth daily. 90 tablet 1  . Multiple Vitamins-Minerals (MULTIVITAMIN WITH MINERALS) tablet Take 1 tablet by mouth daily.    Marland Kitchen omeprazole (PRILOSEC) 40 MG capsule Take 1 capsule (40 mg total) by mouth daily. 90 capsule 1  . zinc gluconate 50 MG tablet Take 50 mg by mouth daily.    Marland Kitchen zolpidem (AMBIEN) 10 MG tablet Take 1 tablet (10 mg total) by mouth at bedtime as needed for sleep. 30 tablet 1   No facility-administered medications prior to visit.     Review of Systems:   Constitutional:   No  weight loss, night sweats,   +Fevers, chills, fatigue, or  lassitude.  HEENT:   No headaches,  Difficulty swallowing,  Tooth/dental problems, or  Sore throat,                No sneezing, itching, ear ache,  +nasal congestion, post nasal drip,   CV:  No chest pain,  Orthopnea, PND, swelling in lower extremities, anasarca, dizziness, palpitations, syncope.   GI  No heartburn, indigestion, abdominal pain, nausea, vomiting, diarrhea, change in bowel habits, loss of appetite, bloody stools.   Resp: + No chest wall deformity  Skin: no rash or lesions.  GU: no dysuria, change in color of urine, no urgency or frequency.  No flank pain, no hematuria   MS:  No joint pain or swelling.  No decreased range of motion.  No back pain.    Physical Exam  BP 110/60 (BP Location: Right Arm, Patient Position: Sitting, Cuff Size: Large)   Pulse (!) 110   Temp 98.9 F (37.2 C)   Ht 5' 1"  (1.549 m)   Wt 156 lb 6.4 oz (70.9 kg)   SpO2 91% Comment: 88-91% on RA after exerting herself walking  BMI 29.55 kg/m   GEN: A/Ox3; pleasant , NAD, well nourished    HEENT:  [place]/AT,  NOSE-clear, THROAT-clear, no lesions, no postnasal drip or exudate noted.   NECK:  Supple w/ fair ROM; no JVD; normal carotid impulses w/o bruits; no thyromegaly or nodules palpated; no lymphadenopathy.    No stridor   RESP  Scattered rhonchi,  no accessory muscle use, no dullness to percussion Speaks in full sentences   CARD:  RRR, no m/r/g, no peripheral edema, pulses intact, no cyanosis or clubbing.  GI:   Soft & nt; nml bowel sounds; no organomegaly or masses detected.   Musco: Warm bil, no deformities or joint swelling noted.   Neuro: alert, no focal deficits noted.  Skin: Warm, no lesions or rashes    Lab Results:   BNP    Component Value Date/Time   BNP 194.6 (H) 04/23/2014 1223    ProBNP No results found for: PROBNP  Imaging: DG Chest 2 View  Result Date: 11/20/2019 CLINICAL DATA:  COPD with acute exacerbation. EXAM: CHEST - 2 VIEW COMPARISON:  Chest CT 11/13/2019 FINDINGS: Previous right upper lobectomy. Heart size is normal. Chronic aortic atherosclerotic calcification. Lungs are clear. Overlying density related to breast implants. No effusion. No acute bone finding. IMPRESSION: 1. No active disease. Previous right upper lobectomy. 2. Aortic atherosclerosis. Electronically Signed   By: Nelson Chimes M.D.   On: 11/20/2019 14:49   CT Chest Wo Contrast  Addendum Date: 11/14/2019   ADDENDUM REPORT: 11/14/2019 13:49 ADDENDUM: Low-density lesion in the interpolar RIGHT kidney is incompletely imaged on the RIGHT but appears stable with respect to imaged portions where was compatible with a small cyst. Examination from June of 2020 was made available after initial review. Electronically Signed   By: Zetta Bills M.D.   On: 11/14/2019 13:49   Result Date: 11/14/2019 CLINICAL DATA:  Non-small cell lung cancer, follow-up evaluation also with history of breast cancer. EXAM: CT CHEST WITHOUT CONTRAST TECHNIQUE: Multidetector CT imaging of the chest was performed following the standard protocol without IV contrast. COMPARISON:  01/11/2016 FINDINGS: Cardiovascular: Calcified atheromatous plaque of the thoracic aorta. Three-vessel coronary artery disease. Normal heart size  without pericardial effusion. Normal caliber central pulmonary vasculature. Limited assessment of cardiovascular structures given lack of intravenous contrast. Mediastinum/Nodes: No axillary lymphadenopathy. No thoracic inlet lymphadenopathy. No mediastinal or gross hilar adenopathy. Esophagus grossly normal. Lungs/Pleura: Tiny perifissural nodule in the LEFT upper chest along the major fissure likely intrapulmonary lymph node, not changed since December of 2017. No consolidation. No pleural effusion. Postoperative changes related to RIGHT upper lobectomy are stable. Mild pulmonary emphysema worse at the lung apex on the LEFT as before. Upper Abdomen: Imaged portions of the liver, gallbladder, pancreas, spleen and adrenal glands without acute process. The adrenal glands are normal. Low-density lesions arising from the upper pole of the LEFT kidney, grossly stable. Kidneys are incompletely imaged. Small low-density area in the upper pole the RIGHT kidney incompletely imaged with water density in imaged portions. This area was not present on the previous imaging study and not seen on a prior ultrasound, likely a cyst but incompletely imaged on today's evaluation. Musculoskeletal: Bilateral breast implants with peripheral calcification with similar appearance to the prior study. Contour irregularities of these breast implants as before. Spinal degenerative changes. IMPRESSION: 1. Stable postoperative changes related to RIGHT upper lobectomy. 2. No evidence of recurrent or metastatic disease. 3. Three-vessel coronary artery disease. 4. Small low-density area in the upper pole the RIGHT kidney incompletely imaged with water density in imaged portions. This area was not present on the previous imaging study and not seen on a prior ultrasound, likely a cyst but incompletely imaged on today's evaluation. Consider renal ultrasound on follow-up for further assessment. 5. Emphysema and aortic atherosclerosis. Aortic  Atherosclerosis (ICD10-I70.0) and Emphysema (ICD10-J43.9). Electronically Signed: By: Zetta Bills M.D. On: 11/14/2019 13:32      PFT Results Latest Ref Rng & Units 05/16/2017 05/21/2015  FVC-Pre L 2.58 2.57  FVC-Predicted Pre % 87 85  FVC-Post L 2.60 2.70  FVC-Predicted Post % 87 89  Pre FEV1/FVC % % 72 72  Post FEV1/FCV % % 74 73  FEV1-Pre L 1.85 1.85  FEV1-Predicted Pre % 80 79  FEV1-Post L 1.93 1.96  DLCO uncorrected ml/min/mmHg 14.37 11.12  DLCO UNC% % 71 55  DLVA Predicted % 83 67  TLC L 4.16 4.46  TLC % Predicted % 90 96  RV % Predicted % 82 94    Lab Results  Component Value Date   NITRICOXIDE <5 12/03/2015        Assessment & Plan:   COPD with acute exacerbation ([place]) Chest x-ray shows no evidence of pneumonia.  Patient appears to have an acute bronchitis with COPD/asthmatic bronchitic exacerbation. We will treat with empiric antibiotics and steroids. Would like for patient to restart Symbicort.  May use albuterol as needed.  Plan  Patient Instructions  Augmentin 865m Twice daily  For 7 days , take with food.  Mucinex DM Twice daily  As needed  Cough/congestion  Prednisone taper over next week.  Fluids and rest  Restart Symbicort 2 puffs Twice daily  , rinse after use.  Follow up with Dr. SHalford Chessman In 4-6 weeks and As needed   Please contact office for sooner follow up if symptoms do not improve or worsen or seek emergency care          TRexene Edison NP 11/20/2019

## 2019-11-20 NOTE — Patient Instructions (Addendum)
Augmentin 875mg  Twice daily  For 7 days , take with food.  Mucinex DM Twice daily  As needed  Cough/congestion  Prednisone taper over next week.  Fluids and rest  Restart Symbicort 2 puffs Twice daily  , rinse after use.  Follow up with Dr. Halford Chessman  In 4-6 weeks and As needed   Please contact office for sooner follow up if symptoms do not improve or worsen or seek emergency care

## 2019-11-20 NOTE — Patient Instructions (Signed)
[place] at [facility] Discharge Instructions  You were seen and examined today by Dr. Delton Coombes. Dr. Delton Coombes discussed your past medical history and family history of cancer.  Dr. Delton Coombes has recommended blood work today and a repeat diagnostic mammogram. You should have a repeat chest CT in 1 year as your recent chest CT did not show anything concerning for recurrent malignancy.   We will see you back in 6 months with lab work.   Thank you for choosing [place] at [facility] to provide your oncology and hematology care.  To afford each patient quality time with our provider, please arrive at least 15 minutes before your scheduled appointment time.   If you have a lab appointment with the [facility] please come in thru the Main Entrance and check in at the main information desk.  You need to re-schedule your appointment should you arrive 10 or more minutes late.  We strive to give you quality time with our providers, and arriving late affects you and other patients whose appointments are after yours.  Also, if you no show three or more times for appointments you may be dismissed from the clinic at the providers discretion.     Again, thank you for choosing [facility].  Our hope is that these requests will decrease the amount of time that you wait before being seen by our physicians.       _____________________________________________________________  Should you have questions after your visit to [facility], please contact our office at [phone] and follow the prompts.  Our office hours are 8:00 a.m. and 4:30 p.m. Monday - Friday.  Please note that voicemails left after 4:00 p.m. may not be returned until the following business day.  We are closed weekends and major holidays.  You do have access to a nurse 24-7, just call the main number to the clinic [phone] and do not press any options, hold on  the line and a nurse will answer the phone.    For prescription refill requests, have your pharmacy contact our office and allow 72 hours.    Due to Covid, you will need to wear a mask upon entering the hospital. If you do not have a mask, a mask will be given to you at the Main Entrance upon arrival. For doctor visits, patients may have 1 support person age 32 or older with them. For treatment visits, patients can not have anyone with them due to social distancing guidelines and our immunocompromised population.

## 2019-11-20 NOTE — Assessment & Plan Note (Signed)
Chest x-ray shows no evidence of pneumonia.  Patient appears to have an acute bronchitis with COPD/asthmatic bronchitic exacerbation. We will treat with empiric antibiotics and steroids. Would like for patient to restart Symbicort.  May use albuterol as needed.  Plan  Patient Instructions  Augmentin 875mg  Twice daily  For 7 days , take with food.  Mucinex DM Twice daily  As needed  Cough/congestion  Prednisone taper over next week.  Fluids and rest  Restart Symbicort 2 puffs Twice daily  , rinse after use.  Follow up with Dr. Halford Chessman  In 4-6 weeks and As needed   Please contact office for sooner follow up if symptoms do not improve or worsen or seek emergency care

## 2019-11-21 LAB — HEMOGLOBIN A1C
Hgb A1c MFr Bld: 5.6 % (ref 4.8–5.6)
Mean Plasma Glucose: 114 mg/dL

## 2019-11-21 NOTE — Progress Notes (Signed)
Reviewed and agree with assessment/plan.   Chesley Mires, MD [facility] Pulmonary/Critical Care 11/21/2019, 12:10 PM Pager:  [phone]

## 2019-12-04 ENCOUNTER — Other Ambulatory Visit (HOSPITAL_COMMUNITY): Payer: Self-pay | Admitting: Hematology

## 2019-12-04 DIAGNOSIS — Z1231 Encounter for screening mammogram for malignant neoplasm of breast: Secondary | ICD-10-CM

## 2019-12-05 ENCOUNTER — Encounter (HOSPITAL_COMMUNITY): Payer: Self-pay

## 2019-12-05 ENCOUNTER — Other Ambulatory Visit: Payer: Self-pay

## 2019-12-05 ENCOUNTER — Ambulatory Visit (HOSPITAL_COMMUNITY)
Admission: RE | Admit: 2019-12-05 | Discharge: 2019-12-05 | Disposition: A | Discharge status: Home / Self Care | Payer: 59 | Source: Ambulatory Visit | Attending: Hematology | Admitting: Hematology

## 2019-12-05 ENCOUNTER — Other Ambulatory Visit (HOSPITAL_COMMUNITY): Payer: Self-pay | Admitting: Hematology

## 2019-12-05 DIAGNOSIS — Z1231 Encounter for screening mammogram for malignant neoplasm of breast: Secondary | ICD-10-CM | POA: Diagnosis not present

## 2019-12-09 ENCOUNTER — Other Ambulatory Visit: Payer: Self-pay | Admitting: Physician Assistant

## 2019-12-09 NOTE — Telephone Encounter (Signed)
LFD 10/02/19 #30 with 1 refill LOV 07/07/19 for insomnia NOV 04/16/20 for physical

## 2019-12-30 ENCOUNTER — Encounter (HOSPITAL_COMMUNITY): Payer: 59

## 2020-01-05 ENCOUNTER — Other Ambulatory Visit: Payer: Self-pay

## 2020-01-05 ENCOUNTER — Ambulatory Visit: Payer: 59

## 2020-01-05 DIAGNOSIS — G4733 Obstructive sleep apnea (adult) (pediatric): Secondary | ICD-10-CM | POA: Diagnosis not present

## 2020-01-05 DIAGNOSIS — R0683 Snoring: Secondary | ICD-10-CM

## 2020-01-07 ENCOUNTER — Telehealth: Payer: Self-pay | Admitting: Pulmonary Disease

## 2020-01-07 DIAGNOSIS — G4733 Obstructive sleep apnea (adult) (pediatric): Secondary | ICD-10-CM

## 2020-01-07 NOTE — Telephone Encounter (Signed)
HST 01/05/20 >> AHI 13.3, SpO2 low 81%   Please inform her that her sleep study shows mild obstructive sleep apnea.  Please arrange for ROV with me or NP to discuss treatment options.

## 2020-01-09 NOTE — Telephone Encounter (Signed)
Patient returned phone call, writer went over HST results per Dr Halford Chessman with patient. Patient expressed understanding and preferring to see Dr Halford Chessman and not an NP to go over treatment options at the [place] office. Scheduled patient for Friday, February 4th, 2022 at noon with Dr Halford Chessman at the [place] office.

## 2020-01-13 ENCOUNTER — Other Ambulatory Visit: Payer: Self-pay | Admitting: Physician Assistant

## 2020-01-13 DIAGNOSIS — I10 Essential (primary) hypertension: Secondary | ICD-10-CM

## 2020-01-13 NOTE — Telephone Encounter (Signed)
Hydroxyzine LFD 12/09/19 #30 with no fefills Zolpidem LFD 10/02/19 #30 with 1 refill LOV 07/07/19 NOV 04/16/20 for cpe Patient is not due for lisinopril, still has 1 refill left on a 90 day supply

## 2020-02-14 ENCOUNTER — Other Ambulatory Visit: Payer: Self-pay | Admitting: Physician Assistant

## 2020-02-14 DIAGNOSIS — C50911 Malignant neoplasm of unspecified site of right female breast: Secondary | ICD-10-CM

## 2020-02-14 DIAGNOSIS — K219 Gastro-esophageal reflux disease without esophagitis: Secondary | ICD-10-CM

## 2020-02-17 ENCOUNTER — Other Ambulatory Visit: Payer: Self-pay | Admitting: Emergency Medicine

## 2020-02-27 ENCOUNTER — Ambulatory Visit: Payer: 59 | Admitting: Pulmonary Disease

## 2020-03-19 ENCOUNTER — Other Ambulatory Visit: Payer: Self-pay

## 2020-03-19 ENCOUNTER — Encounter: Payer: Self-pay | Admitting: Pulmonary Disease

## 2020-03-19 ENCOUNTER — Ambulatory Visit (INDEPENDENT_AMBULATORY_CARE_PROVIDER_SITE_OTHER): Payer: 59 | Admitting: Pulmonary Disease

## 2020-03-19 DIAGNOSIS — J9611 Chronic respiratory failure with hypoxia: Secondary | ICD-10-CM

## 2020-03-19 DIAGNOSIS — J4541 Moderate persistent asthma with (acute) exacerbation: Secondary | ICD-10-CM

## 2020-03-19 DIAGNOSIS — G4734 Idiopathic sleep related nonobstructive alveolar hypoventilation: Secondary | ICD-10-CM | POA: Diagnosis not present

## 2020-03-19 NOTE — Progress Notes (Signed)
Pulmonary, Critical Care, and Sleep Medicine  Chief Complaint  Patient presents with  . Follow-up    SOB with activity no change since last visit     Constitutional:  There were no vitals taken for this visit.  Deferred.  Past Medical History:  Bipolar, depression, Chronic pain GERD, HLD, Orthostatic hypotension, ETOH, Rt breast cancer  Past Surgical History:  Her  has a past surgical history that includes Breast lumpectomy with sentinel lymph node bx (Right, 04/2012); Tonsillectomy; Video bronchoscopy (N/A, 04/20/2014); Video assisted thoracoscopy (vats)/wedge resection (Right, 04/20/2014); Wedge resection (Right, 04/20/2014); Lobectomy (Right, 04/20/2014); Node dissection (Right, 04/20/2014); Breast lumpectomy (Left, 1990); Esophagogastroduodenoscopy (egd) with propofol (N/A, 05/10/2017); Breast lumpectomy (Right); Vaginal hysterectomy; Placement of breast implants (1993); Plantar fascia release (Left); and Shoulder arthroscopy with subacromial decompression, rotator cuff repair and bicep tendon repair (Right, 06/27/2017).  Brief Summary:  Gabrielle [name] is a 65 y.o. female former smoker with wheezing and nocturnal hypoxia after RULectomy in March 2956 for NSCLC complicated by steroid responsive pulmonary infiltrates.      Subjective:   Virtual Visit via Telephone Note  I connected with Gabrielle [name] on 03/19/20 at 10:30 AM EST by telephone and verified that I am speaking with the correct person using two identifiers.  Location: Patient: home Provider: medical office   I discussed the limitations, risks, security and privacy concerns of performing an evaluation and management service by telephone and the availability of in person appointments. I also discussed with the patient that there may be a patient responsible charge related to this service. The patient expressed understanding and agreed to proceed.   History of Present Illness:   CT chest from October  was stable.  Treated for COPD exacerbation in October.  Had home sleep study in December.  Showed mild sleep apnea.  She is using oxygen at night.  She lost her insurance and doesn't think she can afford CPAP set up at this time.  Physical Exam:  Deferred.   Pulmonary testing:   PFT 04/07/14 >> FEV1 1.91 (85%), FEV1% 73, TLC 4.22 (98%), DLCO 56%  Labs 05/06/15 >> CPK 59, RF < 10, ANA negative, ESR 23  PFT 05/21/15 >> FEV1 1.96 (83%), FEV1% 73, FEF 25-75% 1.56 (68%), TLC 4.46 (96%), DLCO 55%, +BD from FEF 25-75%  FeNO 12/03/15 >> 5  PFT 05/16/17 >> FEV1 1.93 (84%), FEV1% 74, TLC 4.16 (90%), DLCO 71%, no BD  Chest Imaging:   PET scan 03/07/06 >> hypermetabolic RUL nodule 1.4 x 1.3 cm  CT chest 11/14/19 >> mild emphysema, s/p RULectomy  Sleep Tests:   ONO with RA 07/24/14 >>test time 9 hrs 57 min. Mean SpO2 91.8%, low SpO2 82%. Spent 1 hr 59 min with SpO2 <88%.  ONO on RA 06/01/15 >>test time 7 hrs 50 min. Mean SpO2 90.07%, low SpO2 67%. Spent 2 hrs 24 min with SpO2 <88%. She needs to continue using supplemental oxygen at night  ONO with RA 05/30/17 >>test time 4 hrs 44 min. Average SpO2 90%, low SpO2 78%. Spent 1 hr 7 min with SpO2 <88%.  HST 01/05/20 >> AHI 13.3, SpO2 low 81%  Cardiac Tests:   Echo 04/28/14 >> EF 55 to 65%, grade 1 diastolic dysfx  Social History:  She  reports that she quit smoking about 5 years ago. Her smoking use included cigarettes. She has a 40.00 pack-year smoking history. She has never used smokeless tobacco. She reports current alcohol use of about 6.0 standard drinks of  alcohol per week. She reports that she does not use drugs.  Family History:  Her family history includes Hypertension in her father and mother; Leukemia in her maternal grandmother; Lymphoma (age of onset: 90) in her maternal aunt.     Assessment/Plan:   Sleep maintenance insomnia with snoring, apnea, and daytime sleepiness. - home sleep study shows mild sleep apnea - she  doesn't think she can afford CPAP set up at this time since she lost her health insurance  Persistent asthmaand mild emphysema changes on CT chest. - prn symbicort when her symptoms flare up  Nocturnal hypoxemia. - continue 2 liters oxygen at night  History of breast cancer and non small cell lung cancer. - followed by Dr. Delton Coombes with oncology in [place]   Time Spent Involved in Patient Care on Day of Examination:   I discussed the assessment and treatment plan with the patient. The patient was provided an opportunity to ask questions and all were answered. The patient agreed with the plan and demonstrated an understanding of the instructions.   The patient was advised to call back or seek an in-person evaluation if the symptoms worsen or if the condition fails to improve as anticipated.  I provided 13 minutes of non-face-to-face time during this encounter.   Follow up:  Patient Instructions  Follow up in 6 months   Medication List:   Allergies as of 03/19/2020      Reactions   Hydrocodone Other (See Comments)   Makes her hyper      Medication List       Accurate as of March 19, 2020 10:58 AM. If you have any questions, ask your nurse or doctor.        STOP taking these medications   predniSONE 10 MG tablet Commonly known as: DELTASONE Stopped by: Chesley Mires, MD   vitamin C 100 MG tablet Stopped by: Chesley Mires, MD     TAKE these medications   acetaminophen 500 MG tablet Commonly known as: TYLENOL Take 500 mg by mouth every 8 (eight) hours as needed.   albuterol 108 (90 Base) MCG/ACT inhaler Commonly known as: VENTOLIN HFA Inhale 2 puffs into the lungs every 6 (six) hours as needed for wheezing or shortness of breath.   atorvastatin 40 MG tablet Commonly known as: LIPITOR Take 40 mg by mouth daily.   budesonide-formoterol 160-4.5 MCG/ACT inhaler Commonly known as: SYMBICORT Inhale 2 puffs into the lungs 2 (two) times daily.    cholecalciferol 25 MCG (1000 UNIT) tablet Commonly known as: VITAMIN D3 Take 1,000 Units by mouth daily.   gabapentin 800 MG tablet Commonly known as: NEURONTIN Take 800 mg by mouth 3 (three) times daily.   hydrOXYzine 50 MG tablet Commonly known as: ATARAX/VISTARIL TAKE 1 TABLET BY MOUTH AT BEDTIME   lisinopril 20 MG tablet Commonly known as: ZESTRIL Take 1 tablet by mouth once daily   multivitamin with minerals tablet Take 1 tablet by mouth daily.   omeprazole 40 MG capsule Commonly known as: PRILOSEC Take 1 capsule by mouth once daily   zinc gluconate 50 MG tablet Take 50 mg by mouth daily.   zolpidem 10 MG tablet Commonly known as: AMBIEN TAKE 1 TABLET BY MOUTH AT BEDTIME AS NEEDED FOR SLEEP       Signature:  Chesley Mires, MD [place] Pager - [phone] 03/19/2020, 10:58 AM

## 2020-03-19 NOTE — Patient Instructions (Signed)
Follow up in 6 months 

## 2020-03-24 ENCOUNTER — Encounter: Payer: Self-pay | Admitting: Family Medicine

## 2020-03-26 ENCOUNTER — Other Ambulatory Visit: Payer: Self-pay | Admitting: Family Medicine

## 2020-03-26 ENCOUNTER — Other Ambulatory Visit: Payer: Self-pay

## 2020-03-26 ENCOUNTER — Encounter: Payer: Self-pay | Admitting: Family Medicine

## 2020-03-26 ENCOUNTER — Ambulatory Visit (INDEPENDENT_AMBULATORY_CARE_PROVIDER_SITE_OTHER): Payer: 59 | Admitting: Family Medicine

## 2020-03-26 VITALS — BP 113/73 | HR 91 | Temp 98.5°F | Ht 59.75 in | Wt 155.0 lb

## 2020-03-26 DIAGNOSIS — F5101 Primary insomnia: Secondary | ICD-10-CM

## 2020-03-26 DIAGNOSIS — K219 Gastro-esophageal reflux disease without esophagitis: Secondary | ICD-10-CM

## 2020-03-26 DIAGNOSIS — I10 Essential (primary) hypertension: Secondary | ICD-10-CM

## 2020-03-26 DIAGNOSIS — M8580 Other specified disorders of bone density and structure, unspecified site: Secondary | ICD-10-CM

## 2020-03-26 DIAGNOSIS — Z780 Asymptomatic menopausal state: Secondary | ICD-10-CM

## 2020-03-26 DIAGNOSIS — E785 Hyperlipidemia, unspecified: Secondary | ICD-10-CM | POA: Diagnosis not present

## 2020-03-26 DIAGNOSIS — G629 Polyneuropathy, unspecified: Secondary | ICD-10-CM | POA: Insufficient documentation

## 2020-03-26 DIAGNOSIS — C50911 Malignant neoplasm of unspecified site of right female breast: Secondary | ICD-10-CM

## 2020-03-26 DIAGNOSIS — E871 Hypo-osmolality and hyponatremia: Secondary | ICD-10-CM

## 2020-03-26 MED ORDER — GABAPENTIN 800 MG PO TABS: 800.0000 mg | ORAL_TABLET | Freq: Every day | ORAL | 1 refills | Status: DC

## 2020-03-26 MED ORDER — HYDROXYZINE HCL 50 MG PO TABS: 50.0000 mg | ORAL_TABLET | Freq: Every day | ORAL | 1 refills | Status: DC

## 2020-03-26 MED ORDER — OMEPRAZOLE 40 MG PO CPDR: 40.0000 mg | DELAYED_RELEASE_CAPSULE | Freq: Every day | ORAL | 1 refills | Status: DC

## 2020-03-26 MED ORDER — LISINOPRIL 20 MG PO TABS: 20.0000 mg | ORAL_TABLET | Freq: Every day | ORAL | 1 refills | Status: DC

## 2020-03-26 MED ORDER — ATORVASTATIN CALCIUM 40 MG PO TABS: 40.0000 mg | ORAL_TABLET | Freq: Every day | ORAL | 1 refills | Status: DC

## 2020-03-26 MED ORDER — ZOLPIDEM TARTRATE ER 12.5 MG PO TBCR: 12.5000 mg | EXTENDED_RELEASE_TABLET | Freq: Every evening | ORAL | 1 refills | Status: DC | PRN

## 2020-03-26 NOTE — Patient Instructions (Addendum)
Check in to the orange card [email or web address]  Also check your health dept. They can provide immunizations and some health depts have medication assistance programs.     Nice to meet you today.  Next appt in 5.5 months.     Hyponatremia Hyponatremia is when the amount of salt (sodium) in your blood is too low. When salt levels are low, your body may take in extra water. This can cause swelling throughout the body. The swelling often affects the brain. What are the causes? This condition may be caused by:  Certain medical problems or conditions.  Vomiting a lot.  Having watery poop (diarrhea) often.  Certain medicines or illegal drugs.  Not having enough water in the body (dehydration).  Drinking too much water.  Eating a diet that is low in salt.  Large burns on your body.  Too much sweating. What increases the risk? You are more likely to get this condition if you:  Have long-term (chronic) kidney disease.  Have heart failure.  Have a medical condition that causes you to have watery poop often.  Do very hard exercises.  Take medicines that affect the amount of salt is in your blood. What are the signs or symptoms? Symptoms of this condition include:  Headache.  Feeling like you may vomit (nausea).  Vomiting.  Being very tired (lethargic).  Muscle weakness and cramps.  Not wanting to eat as much as normal (loss of appetite).  Feeling weak or light-headed. Severe symptoms of this condition include:  Confusion.  Feeling restless (agitation).  Having a fast heart rate.  Passing out (fainting).  Seizures.  Coma. How is this treated? Treatment for this condition depends on the cause. Treatment may include:  Getting fluids through an IV tube that is put into one of your veins.  Taking medicines to fix the salt levels in your blood. If medicines are causing the problem, your medicines will need to be changed.  Limiting how  much water or fluid you take in.  Monitoring in the hospital to watch your symptoms.   Follow these instructions at home:  Take over-the-counter and prescription medicines only as told by your doctor. Many medicines can make this condition worse. Talk with your doctor about any medicines that you are taking.  Eat and drink exactly as you are told by your doctor. ? Eat only the foods you are told to eat. ? Limit how much fluid you take.  Do not drink alcohol.  Keep all follow-up visits as told by your doctor. This is important.   Contact a doctor if:  You feel more like you may vomit.  You feel more tired.  Your headache gets worse.  You feel more confused.  You feel weaker.  Your symptoms go away and then they come back.  You have trouble following the diet instructions. Get help right away if:  You have a seizure.  You pass out.  You keep having watery poop.  You keep vomiting. Summary  Hyponatremia is when the amount of salt in your blood is too low.  When salt levels are low, you can have swelling throughout the body. The swelling mostly affects the brain.  Treatment depends on the cause. Treatment may include getting IV fluids, medicines, or not drinking as much fluid. This information is not intended to replace advice given to you by your health care provider. Make sure you discuss any questions you have with your health care provider. Document Revised: 03/28/2018 Document Reviewed: 12/13/2017  Elsevier Patient Education  2021 [employer].

## 2020-03-26 NOTE — Progress Notes (Signed)
Patient ID: Gabrielle [name], female  DOB: 1955-07-19, 65 y.o.   MRN: [id number] Patient Care Team    Relationship Specialty Notifications Start End  Ma Hillock, DO PCP - General Family Medicine  03/26/20   Everardo All, MD Consulting Physician Oncology  04/06/14   Chesley Mires, MD Consulting Physician Pulmonary Disease  03/14/19   Willia Craze, NP Nurse Practitioner Gastroenterology  03/14/19   Ramonita Lab, MD Referring Physician Oncology  03/14/19   Brien Mates, RN Oncology Nurse Navigator Oncology  11/18/19     Chief Complaint  Patient presents with  . Establish Care    [facility]; refills pending;     Subjective: Gabrielle [name] is a 65 y.o.  female present for TOC. All past medical history, surgical history, allergies, family history, immunizations, medications and social history were updated in the electronic medical record today. All recent labs, ED visits and hospitalizations within the last year were reviewed.  Primary hypertension/Hyperlipidemia LDL goal <70 Pt reports compliance with lisinopril 20 mg daily and atorvastatin. Blood pressures ranges at home normal. Patient denies chest pain, shortness of breath or lower extremity edema. Pt does not daily baby ASA. Pt is  prescribed statin. RF: Hypertension, hyperlipidemia, former smoker, BMI greater than 30, family history  Gastroesophageal reflux disease without esophagitis Patient reports compliance with Prilosec 40 mg daily.  This helps control her symptoms.  Primary insomnia Patient reports she has had difficulty sleeping for many years and has been tried on many different medications.  She was taking trazodone 300 mg nightly, but needed this medication change due to formulary change.  She has been tried on [employer] which did not work well.  She states the Ambien 10 mg helps but she still wakes up multiple times throughout the night.  She also is prescribed Vistaril 50 mg nightly.  She denies  oversedation.  Depression screen [facility] 2/9 03/26/2020  Decreased Interest 0  Down, Depressed, Hopeless 0  PHQ - 2 Score 0   No flowsheet data found.     Fall Risk  03/14/2019  Falls in the past year? 0  Number falls in past yr: 0  Injury with Fall? 0  Follow up Falls evaluation completed   Immunization History  Administered Date(s) Administered  . Influenza Inj Mdck Quad Pf 10/25/2018  . Influenza Split 09/23/2013  . Influenza,inj,Quad PF,6+ Mos 11/05/2014, 12/31/2015, 11/05/2019  . Influenza-Unspecified 10/22/2016, 10/25/2018  . Moderna Sars-Covid-2 Vaccination 01/07/2020  . PFIZER(Purple Top)SARS-COV-2 Vaccination 05/02/2019, 06/03/2019  . Pneumococcal Polysaccharide-23 05/24/2015  . Tdap 04/11/2008    No exam data present  Past Medical History:  Diagnosis Date  . Anemia   . Arthritis   . Asthma   . Carpal tunnel syndrome    bil  . Carpal tunnel syndrome   . Chronic pain   . Chronic pain in left foot   . Cough   . Depression    espisodic  . Drug abuse ([place])   . Dyspnea    with exertion an have failed over night oximenrty - wears oxygen at hs  . GERD (gastroesophageal reflux disease)   . H/O eating disorder   . history of Substance abuse ([place])    Substance abuse in the past.    . Hyperlipidemia   . Hypertension   . Invasive ductal carcinoma of right breast ([place]) 08/21/2012   Right  . Legally blind   . Lung cancer ([place]) 2016  . Orthostatic hypotension   .  Pneumonia    03-13-17  . S/P bilateral breast implants 08/21/2012   Prior to cancer diagnosis  . S/P bilateral breast implants 08/21/2012   Prior to cancer diagnosis   . Sleep apnea   . Sleep apnea   . Tobacco abuse 08/21/2012   > 40 pack years   Allergies  Allergen Reactions  . Hydrocodone Other (See Comments)    Makes her hyper   Past Surgical History:  Procedure Laterality Date  . BREAST LUMPECTOMY Left 1990   [facility] / right was cancer  . BREAST LUMPECTOMY Right 2014  . BREAST LUMPECTOMY WITH  SENTINEL LYMPH NODE BIOPSY Right 04/2012  . ESOPHAGOGASTRODUODENOSCOPY (EGD) WITH PROPOFOL N/A 05/10/2017   Procedure: ESOPHAGOGASTRODUODENOSCOPY (EGD) WITH PROPOFOL;  Surgeon: Milus Banister, MD;  Location: [facility];  Service: Endoscopy;  Laterality: N/A;  . LOBECTOMY Right 04/20/2014   Procedure: LOBECTOMY, RIGHT UPPER LOBE WITH PLACEMENT OF ONQ PAIN PUMP;  Surgeon: Grace Isaac, MD;  Location: [place];  Service: Thoracic;  Laterality: Right;  . NODE DISSECTION Right 04/20/2014   Procedure: NODE DISSECTION;  Surgeon: Grace Isaac, MD;  Location: [place];  Service: Thoracic;  Laterality: Right;  . PLACEMENT OF BREAST IMPLANTS  1993  . PLANTAR FASCIA RELEASE Left 1990s  . SHOULDER ARTHROSCOPY WITH SUBACROMIAL DECOMPRESSION, ROTATOR CUFF REPAIR AND BICEP TENDON REPAIR Right 06/27/2017   Procedure: RIGHT SHOULDER ARTHROSCOPY WITH DEBRIDEMENT, SUBACROMIAL DECOMPRESSION AND ROTATOR CUFF REPAIR, BICEP TENODESIS;  Surgeon: Hiram Gash, MD;  Location: [place];  Service: Orthopedics;  Laterality: Right;  . TONSILLECTOMY AND ADENOIDECTOMY  1965  . VAGINAL HYSTERECTOMY  1990  . VIDEO ASSISTED THORACOSCOPY (VATS)/WEDGE RESECTION Right 04/20/2014   Procedure: VIDEO ASSISTED THORACOSCOPY ;  Surgeon: Grace Isaac, MD;  Location: [place];  Service: Thoracic;  Laterality: Right;  Marland Kitchen VIDEO BRONCHOSCOPY N/A 04/20/2014   Procedure: VIDEO BRONCHOSCOPY;  Surgeon: Grace Isaac, MD;  Location[facility] OR;  Service: Thoracic;  Laterality: N/A;  . WEDGE RESECTION Right 04/20/2014   Procedure: WEDGE RESECTION, RIGHT UPPER LOBE;  Surgeon: Grace Isaac, MD;  Location: [place];  Service: Thoracic;  Laterality: Right;   Family History  Problem Relation Age of Onset  . Hypertension Mother   . Alcohol abuse Mother   . Arthritis Mother   . COPD Mother   . Depression Mother   . Heart disease Mother   . Hyperlipidemia Mother   . Mental illness Mother   . Hypertension Father   . Alcohol abuse Father   . Hearing  loss Father   . Heart attack Father   . Heart disease Father   . Hyperlipidemia Father   . Stroke Father   . Kidney disease Father   . Arthritis Father   . Depression Brother   . Mental illness Brother   . Hepatitis C Brother   . Hepatitis C Brother   . Alcohol abuse Sister   . Depression Sister   . Mental illness Sister   . Lymphoma Maternal Aunt 77  . Leukemia Maternal Grandmother        dx in her late 9s  . Alcohol abuse Maternal Grandmother   . Hearing loss Maternal Grandmother   . Heart disease Maternal Grandfather   . Heart disease Paternal Grandmother   . Birth defects Daughter    Social History   Social History Narrative   Marital status/children/pets: Divorced.   Education/employment: 2 years of college.  Works as a Surveyor, mining.  Allergies as of 03/26/2020      Reactions   Hydrocodone Other (See Comments)   Makes her hyper      Medication List       Accurate as of March 26, 2020  4:33 PM. If you have any questions, ask your nurse or doctor.        STOP taking these medications   acetaminophen 500 MG tablet Commonly known as: TYLENOL Stopped by: Howard Pouch, DO   zinc gluconate 50 MG tablet Stopped by: Howard Pouch, DO   zolpidem 10 MG tablet Commonly known as: AMBIEN Replaced by: zolpidem 12.5 MG CR tablet Stopped by: Howard Pouch, DO     TAKE these medications   albuterol 108 (90 Base) MCG/ACT inhaler Commonly known as: VENTOLIN HFA Inhale 2 puffs into the lungs every 6 (six) hours as needed for wheezing or shortness of breath.   atorvastatin 40 MG tablet Commonly known as: LIPITOR Take 1 tablet (40 mg total) by mouth daily.   budesonide-formoterol 160-4.5 MCG/ACT inhaler Commonly known as: SYMBICORT Inhale 2 puffs into the lungs 2 (two) times daily.   cholecalciferol 25 MCG (1000 UNIT) tablet Commonly known as: VITAMIN D3 Take 1,000 Units by mouth daily.   gabapentin 800 MG tablet Commonly known as:  NEURONTIN Take 1 tablet (800 mg total) by mouth daily. What changed: when to take this Changed by: Howard Pouch, DO   hydrOXYzine 50 MG tablet Commonly known as: ATARAX/VISTARIL Take 1 tablet (50 mg total) by mouth at bedtime.   lisinopril 20 MG tablet Commonly known as: ZESTRIL Take 1 tablet (20 mg total) by mouth daily.   multivitamin with minerals tablet Take 1 tablet by mouth daily.   omeprazole 40 MG capsule Commonly known as: PRILOSEC Take 1 capsule (40 mg total) by mouth daily.   zolpidem 12.5 MG CR tablet Commonly known as: Ambien CR Take 1 tablet (12.5 mg total) by mouth at bedtime as needed for sleep. Replaces: zolpidem 10 MG tablet Started by: Howard Pouch, DO       All past medical history, surgical history, allergies, family history, immunizations andmedications were updated in the EMR today and reviewed under the history and medication portions of their EMR.    No results found for this or any previous visit (from the past 2160 hour(s)).    ROS: 14 pt review of systems performed and negative (unless mentioned in an HPI)  Objective: BP 113/73   Pulse 91   Temp 98.5 F (36.9 C) (Oral)   Ht 4' 11.75" (1.518 m)   Wt 155 lb (70.3 kg)   SpO2 97%   BMI 30.53 kg/m  Gen: Afebrile. No acute distress. Nontoxic in appearance, well-developed, well-nourished, pleasant, obese female. HENT: AT. [place].  No cough on exam. Eyes:Pupils Equal Round Reactive to light, Extraocular movements intact,  Conjunctiva without redness, discharge or icterus. Neck/lymp/endocrine: Supple, no lymphadenopathy, no thyromegaly CV: RRR no murmur, no edema, +2/4 P posterior tibialis pulses.  Chest: Mild wheezing present.  Normal respiratory effort.  Good air movement. Skin:Warm and well-perfused. Skin intact. Neuro/Msk:  Normal gait. PERLA. EOMi. Alert. Oriented x3. Psych: Normal affect, dress and demeanor. Normal speech. Normal thought content and judgment.  Assessment/plan: Gabrielle [name] is a 65 y.o. female present for TOC Gastroesophageal reflux disease without esophagitis Stable Continue omeprazole Monitor vitamin D, B12 and mag every 2-3 years  Primary insomnia Patient seems to have a high tolerance for sedate of medications.  She has a past history of  substance abuse, not currently an issue for her. Continue Vistaril 50 mg nightly Increase Ambien to 12.5 mg  Essential hypertension/hyperlipidemia/morbid obesity Stable. Continue lisinopril 20 mg daily Continue atorvastatin Routine exercise and heart healthy diet - Comp Met (CMET) - Lipid panel - TSH Follow-up 5.5 months  Osteopenia after menopause Continue vitamin D supplementation. DEXA completed 2018 (-2.0).  Repeat in 2023  Hyponatremia Patient reports this is chronic for her.  She does endorse drinking a beer on occasions.  It was also felt to be related to her lung cancer in the past with [facility]. Encouraged patient to attempt to drink a V8 juice or Gatorade daily to help replete sodium levels.   Avoid alcohol and beer   Return in about 6 months (around 09/13/2020) for [facility] (30 min).  Orders Placed This Encounter  Procedures  . Comp Met (CMET)  . Lipid panel  . TSH   Meds ordered this encounter  Medications  . atorvastatin (LIPITOR) 40 MG tablet    Sig: Take 1 tablet (40 mg total) by mouth daily.    Dispense:  90 tablet    Refill:  1  . hydrOXYzine (ATARAX/VISTARIL) 50 MG tablet    Sig: Take 1 tablet (50 mg total) by mouth at bedtime.    Dispense:  90 tablet    Refill:  1  . lisinopril (ZESTRIL) 20 MG tablet    Sig: Take 1 tablet (20 mg total) by mouth daily.    Dispense:  90 tablet    Refill:  1  . omeprazole (PRILOSEC) 40 MG capsule    Sig: Take 1 capsule (40 mg total) by mouth daily.    Dispense:  90 capsule    Refill:  1  . gabapentin (NEURONTIN) 800 MG tablet    Sig: Take 1 tablet (800 mg total) by mouth daily.    Dispense:  90 tablet    Refill:  1  . zolpidem (AMBIEN  CR) 12.5 MG CR tablet    Sig: Take 1 tablet (12.5 mg total) by mouth at bedtime as needed for sleep.    Dispense:  90 tablet    Refill:  1   Referral Orders  No referral(s) requested today     Note is dictated utilizing voice recognition software. Although note has been proof read prior to signing, occasional typographical errors still can be missed. If any questions arise, please do not hesitate to call for verification.  Electronically signed by: Howard Pouch, DO [place]

## 2020-03-27 LAB — LIPID PANEL
Cholesterol: 168 mg/dL (ref <200)
HDL: 77 mg/dL (ref >=50)
LDL Cholesterol (Calc): 65 mg/dL (calc)
Non-HDL Cholesterol (Calc): 91 mg/dL (calc) (ref <130)
Total CHOL/HDL Ratio: 2.2 (calc) (ref <5.0)
Triglycerides: 191 mg/dL — ABNORMAL HIGH (ref <150)

## 2020-03-27 LAB — COMPREHENSIVE METABOLIC PANEL
AG Ratio: 2.9 (calc) — ABNORMAL HIGH (ref 1.0–2.5)
ALT: 22 U/L (ref 6–29)
AST: 26 U/L (ref 10–35)
Albumin: 4.3 g/dL (ref 3.6–5.1)
Alkaline phosphatase (APISO): 86 U/L (ref 37–153)
BUN: 14 mg/dL (ref 7–25)
CO2: 22 mmol/L (ref 20–32)
Calcium: 9.2 mg/dL (ref 8.6–10.4)
Chloride: 104 mmol/L (ref 98–110)
Creat: 0.73 mg/dL (ref 0.50–0.99)
Globulin: 1.5 g/dL (calc) — ABNORMAL LOW (ref 1.9–3.7)
Glucose, Bld: 93 mg/dL (ref 65–99)
Potassium: 4.4 mmol/L (ref 3.5–5.3)
Sodium: 137 mmol/L (ref 135–146)
Total Bilirubin: 0.4 mg/dL (ref 0.2–1.2)
Total Protein: 5.8 g/dL — ABNORMAL LOW (ref 6.1–8.1)

## 2020-03-27 LAB — TSH: TSH: 2.35 mIU/L (ref 0.40–4.50)

## 2020-03-29 ENCOUNTER — Telehealth: Payer: Self-pay | Admitting: Family Medicine

## 2020-03-29 NOTE — Telephone Encounter (Signed)
Please inform patient the following information: Her liver and kiney fx are normal.  Her protein and globulin  levels are down in her blood. This is something that is worked up by oncology/hematology> which she is established. I would encourage her to discuss this with her onc team.  - cholesterol panel is at goal.   Thyroid fx is normal.

## 2020-03-29 NOTE — Telephone Encounter (Signed)
Patient advised and voiced understanding.  

## 2020-03-29 NOTE — Telephone Encounter (Signed)
LM for pt to return call to discuss.  

## 2020-04-01 NOTE — Telephone Encounter (Signed)
Please advise on patient mychart message    Hi Dr. Halford Chessman: You gave me clearance for handicap placard for SOB with exertion.  I am now up for renewal. I took it to my new [place] PCP, Dr. Raoul Pitch.  She checked box that said totally blind or vision so defective with glasses that ordinary activity cannot be carried out.  My vision is no where near that bad.  I am legally blind but corrected with lenses.    Please can you send me a new sheet?  I have both SOB with 100 or more feet and foot pain/hip pain with more than 100 feet.    Thanks,  Gabrielle [name]

## 2020-04-05 ENCOUNTER — Telehealth: Payer: Self-pay | Admitting: Pulmonary Disease

## 2020-04-05 MED ORDER — PREDNISONE 10 MG PO TABS: ORAL_TABLET | ORAL | 0 refills | Status: DC

## 2020-04-05 MED ORDER — PREDNISONE 10 MG PO TABS: ORAL_TABLET | ORAL | 0 refills | Status: AC

## 2020-04-05 MED ORDER — AZITHROMYCIN 250 MG PO TABS: ORAL_TABLET | ORAL | 0 refills | Status: DC

## 2020-04-05 NOTE — Telephone Encounter (Signed)
I called and spoke with the pt  She states developed what felt like a sinus infection approx 10 days ago- sinus pressure and HA on the right side  3 days ago she started having cough, SOB, wheezing, chest tightness  Her cough is not very prod, but sputum yellow when she sees it  She had temp of 100.7 3 days ago- took at home covid test then and this was neg  She also took another at home covid test today which was also neg She is still on her symbicort and has been using her albuterol inhaler  She is asking for pred and abx to be sent  Has had covid vax x 3  Please advise, thanks!

## 2020-04-05 NOTE — Telephone Encounter (Signed)
Can send script for zpak.  Can send script for prednisone 10 mg pill >> 3 pills daily for 2 days, 2 pills daily for 2 days, 1 pill daily for 2 days.  She would need an ROV if not improved after these therapies.

## 2020-04-05 NOTE — Telephone Encounter (Signed)
Call returned to patient, confirmed DOB. Made aware of VS recommendations. Voiced understanding. Confirmed pharmacy. Medication sent in.   Original scipt sent in under [facility] due to mistake of nurse. Order cancelled and re-sent under [facility]. Call made to pharmacy to ensure script will be filled under [facility] and not [facility]. Pharmacist understood and confirmed only script under [facility] would be filled.   Nothing further needed at this time.

## 2020-04-06 ENCOUNTER — Encounter: Payer: Self-pay | Admitting: Family Medicine

## 2020-04-06 NOTE — Telephone Encounter (Signed)
There are many options to help with anxiety that are not considered controlled substances or have addictive properties.  However, we would need to discuss this with her,  provide options and instructions, which require an appointment and currently she may be without insurance.  If she is without insurance, please advise of flat rate for noninsured patients.

## 2020-04-06 NOTE — Telephone Encounter (Signed)
Dr. Halford Chessman, please see most recent mychart message sent by pt about cpap and advise.

## 2020-04-08 ENCOUNTER — Telehealth: Payer: Self-pay | Admitting: Pulmonary Disease

## 2020-04-08 NOTE — Telephone Encounter (Signed)
ATC pt. VM box is full. WCB.  

## 2020-04-08 NOTE — Telephone Encounter (Signed)
Called and spoke with pt letting her know the info stated by BI that we need to get her in for an appt since she was no better after abx and pred. Pt verbalized understanding. Pt has been scheduled for an appt with [name] tomorrow 3/18 at 11:30. Nothing further needed.

## 2020-04-08 NOTE — Telephone Encounter (Signed)
PCCM:  Please review telephone note written by Dr. Halford Chessman.   "Can send script for zpak.   Can send script for prednisone 10 mg pill >> 3 pills daily for 2 days, 2 pills daily for 2 days, 1 pill daily for 2 days.   She would need an ROV if not improved after these therapies."   Please follow his original instructions.   If she is not better. Off next available appt.  [facility] 04/08/2020 1:59 PM

## 2020-04-08 NOTE — Telephone Encounter (Signed)
Called and spoke with pt who wanted to make sure she was taking the prednisone right as she said that the quantity that she received was not adding up with the instructions that are on the Rx. Pt was prescribed #20 tabs of prednisone but she said that she has already done the 3 pills for 2 days and also has done the 2 pills for 2 days but states that she still has about 10 pills left and she only has the 2 days of taking 1 pill daily left. Looked back over the Rx and stated to pt that she was sent in more tabs than she needed.  With that being said, pt wanted to know if her prednisone taper could be extended as she currently still has 10 pills left and pt said that she is still not any better.  Pt called office 3/14 and pt is still about the same as she was when she first called the office.  Pt said that at rest, her O2 sats range from 92-94 on room air and when she exerts herself, her sats have been dropping to about 87% on room air.  Pt said that she does have oxygen that she wears at night only at 2L but states when needed recently, she has been wearing it during the day too.  Pt has also been having complaints of chills, low grade temp of 99.5, increased SOB, headache.  Pt said prior to her originally calling the office on 3/14 about her symptoms, she stated that her symptoms had begun about a week before then, so this is now going in to the 2nd week of her having symptoms and her not feeling any better.  Pt said that she has done 2 home covid tests and both have come back negative. I did state to pt that she may need to go do a covid PCR test to make sure that that comes back negative since she is still having all these symptoms.  Along with taking the prednisone and the abx that had been prescribed, pt said that she has been using her rescue inhaler 3 times a day and has also been using her symbicort inhaler twice a day as prescribed but states that she does not feel like her inhalers are helping. Pt  has also been taking mucinex DM to see if that would help but she said that nothing is really helping.  Pt wants to know what we recommend due to her not being any better and also since her symptoms have now been happening for about 2 weeks.  Due to Dr. Halford Chessman not being at the office, sending to provider of the day. Dr. Valeta Harms, please advise.

## 2020-04-09 ENCOUNTER — Encounter: Payer: Self-pay | Admitting: Primary Care

## 2020-04-09 ENCOUNTER — Other Ambulatory Visit: Payer: Self-pay

## 2020-04-09 ENCOUNTER — Ambulatory Visit (INDEPENDENT_AMBULATORY_CARE_PROVIDER_SITE_OTHER): Payer: 59

## 2020-04-09 ENCOUNTER — Ambulatory Visit (INDEPENDENT_AMBULATORY_CARE_PROVIDER_SITE_OTHER): Payer: 59 | Admitting: Primary Care

## 2020-04-09 VITALS — BP 112/66 | HR 94 | Temp 98.4°F | Ht 59.75 in | Wt 150.0 lb

## 2020-04-09 DIAGNOSIS — J4541 Moderate persistent asthma with (acute) exacerbation: Secondary | ICD-10-CM | POA: Diagnosis not present

## 2020-04-09 DIAGNOSIS — J9611 Chronic respiratory failure with hypoxia: Secondary | ICD-10-CM | POA: Diagnosis not present

## 2020-04-09 DIAGNOSIS — J4540 Moderate persistent asthma, uncomplicated: Secondary | ICD-10-CM

## 2020-04-09 MED ORDER — AMOXICILLIN-POT CLAVULANATE 875-125 MG PO TABS: 1.0000 | ORAL_TABLET | Freq: Two times a day (BID) | ORAL | 0 refills | Status: DC

## 2020-04-09 MED ORDER — ALBUTEROL SULFATE (2.5 MG/3ML) 0.083% IN NEBU: 2.5000 mg | INHALATION_SOLUTION | Freq: Four times a day (QID) | RESPIRATORY_TRACT | 3 refills | Status: DC | PRN

## 2020-04-09 NOTE — Progress Notes (Signed)
@Patient  ID: Gabrielle [name], female    DOB: 25-Oct-1955, 65 y.o.   MRN: [id number]  Chief Complaint  Patient presents with  . Acute Visit    Pt has been having complaints of chills, headache, increased SOB. Pt also has had some right-side chest discomfort. Also has complaints of cough. Pt has had a temp, yesterday low grade temp of 99.5 but no temp today 3/18.    Referring provider: Ma Hillock, DO  HPI: 65 year old female, former smoker quit in 2016 (40-pack-year history).  Past medical history significant for hypertension, asthma, emphysema, nocturnal hypoxemia, non small cell lung cancer s/p lobectomy, breast cancer, GERD, osteopenia, hyperlipidemia, morbid obesity.  Patient of Dr. Halford Chessman, office on 03/19/2020.   04/09/2020  Patient presents today for an acute office visit. She was sent in a prescription for Z-Pak and prednisone taper on 04/05/2020 for upper respiratory symptoms. Symptoms included sinus pressure, HA, low grade temp, chills and sob. Patient is no better after completing antibiotic. She is more winded than normal. She wears oxygen at night and has been using during the day lately prn. Prior to this she had not needed to use her inhalers in several years. She has been taking her Symbicort inhaler twice daily and using her Albuterol rescue inhaler three times a day. Two home covid tests were negative. We reviewed CXR which showed hyperinflation without acute findings.    Allergies  Allergen Reactions  . Hydrocodone Other (See Comments)    Makes her hyper    Immunization History  Administered Date(s) Administered  . Influenza Inj Mdck Quad Pf 10/25/2018  . Influenza Split 09/23/2013  . Influenza,inj,Quad PF,6+ Mos 11/05/2014, 12/31/2015, 11/05/2019  . Influenza-Unspecified 10/22/2016, 10/25/2018  . PFIZER(Purple Top)SARS-COV-2 Vaccination 05/02/2019, 06/03/2019, 01/19/2020  . Pneumococcal Polysaccharide-23 05/24/2015  . Tdap 04/11/2008    Past Medical History:   Diagnosis Date  . Anemia   . Arthritis   . Asthma   . Carpal tunnel syndrome    bil  . Carpal tunnel syndrome   . Chronic pain   . Chronic pain in left foot   . Cough   . Depression    espisodic  . Drug abuse ([place])   . Dyspnea    with exertion an have failed over night oximenrty - wears oxygen at hs  . GERD (gastroesophageal reflux disease)   . H/O eating disorder   . history of Substance abuse ([place])    Substance abuse in the past.    . Hyperlipidemia   . Hypertension   . Invasive ductal carcinoma of right breast ([place]) 08/21/2012   Right  . Legally blind   . Lung cancer ([place]) 2016  . Orthostatic hypotension   . Pneumonia    03-13-17  . S/P bilateral breast implants 08/21/2012   Prior to cancer diagnosis  . S/P bilateral breast implants 08/21/2012   Prior to cancer diagnosis   . Sleep apnea   . Sleep apnea   . Tobacco abuse 08/21/2012   > 40 pack years    Tobacco History: Social History   Tobacco Use  Smoking Status Former Smoker  . Packs/day: 1.00  . Years: 40.00  . Pack years: 40.00  . Types: Cigarettes  . Start date: 89  . Quit date: 04/20/2014  . Years since quitting: 5.9  Smokeless Tobacco Never Used   Counseling given: Not Answered   Outpatient Medications Prior to Visit  Medication Sig Dispense Refill  . albuterol (VENTOLIN HFA) 108 (90 Base) MCG/ACT inhaler  Inhale 2 puffs into the lungs every 6 (six) hours as needed for wheezing or shortness of breath. 6.7 g 5  . atorvastatin (LIPITOR) 40 MG tablet Take 1 tablet (40 mg total) by mouth daily. 90 tablet 1  . budesonide-formoterol (SYMBICORT) 160-4.5 MCG/ACT inhaler Inhale 2 puffs into the lungs 2 (two) times daily. 10.2 g 5  . cholecalciferol (VITAMIN D3) 25 MCG (1000 UNIT) tablet Take 1,000 Units by mouth daily.    Marland Kitchen gabapentin (NEURONTIN) 800 MG tablet Take 1 tablet (800 mg total) by mouth daily. 90 tablet 1  . hydrOXYzine (ATARAX/VISTARIL) 50 MG tablet Take 1 tablet (50 mg total) by mouth at  bedtime. 90 tablet 1  . lisinopril (ZESTRIL) 20 MG tablet Take 1 tablet (20 mg total) by mouth daily. 90 tablet 1  . omeprazole (PRILOSEC) 40 MG capsule Take 1 capsule (40 mg total) by mouth daily. 90 capsule 1  . predniSONE (DELTASONE) 10 MG tablet Take 3 tablets (30 mg total) by mouth daily with breakfast for 2 days, THEN 2 tablets (20 mg total) daily with breakfast for 2 days, THEN 1 tablet (10 mg total) daily with breakfast for 2 days. 20 tablet 0  . zolpidem (AMBIEN CR) 12.5 MG CR tablet Take 1 tablet (12.5 mg total) by mouth at bedtime as needed for sleep. 90 tablet 1  . azithromycin (ZITHROMAX) 250 MG tablet Take 2 tablets today, then 1 tablet daily until gone. 6 tablet 0  . Multiple Vitamins-Minerals (MULTIVITAMIN WITH MINERALS) tablet Take 1 tablet by mouth daily.     No facility-administered medications prior to visit.    Review of Systems  Review of Systems  Constitutional: Positive for chills.  HENT: Positive for congestion and sinus pressure.   Respiratory: Positive for cough and shortness of breath.   Cardiovascular: Negative.    Physical Exam  BP 112/66 (BP Location: Right Arm, Cuff Size: Normal)   Pulse 94   Temp 98.4 F (36.9 C) (Oral)   Ht 4' 11.75" (1.518 m)   Wt 150 lb (68 kg)   SpO2 95% Comment: RA  BMI 29.54 kg/m  Physical Exam Constitutional:      Appearance: Normal appearance.  HENT:     Head: Normocephalic and atraumatic.     Mouth/Throat:     Comments: Deferred d/t masking Cardiovascular:     Rate and Rhythm: Normal rate and regular rhythm.  Pulmonary:     Breath sounds: Wheezing and rhonchi present.  Musculoskeletal:        General: Normal range of motion.  Skin:    General: Skin is warm and dry.  Neurological:     General: No focal deficit present.     Mental Status: She is alert and oriented to person, place, and time. Mental status is at baseline.  Psychiatric:        Mood and Affect: Mood normal.        Behavior: Behavior normal.         Thought Content: Thought content normal.        Judgment: Judgment normal.      Lab Results:  CBC    Component Value Date/Time   WBC 13.6 (H) 11/20/2019 0940   RBC 3.86 (L) 11/20/2019 0940   HGB 12.5 11/20/2019 0940   HCT 36.3 11/20/2019 0940   PLT 258 11/20/2019 0940   MCV 94.0 11/20/2019 0940   MCH 32.4 11/20/2019 0940   MCHC 34.4 11/20/2019 0940   RDW 13.8 11/20/2019 0940   LYMPHSABS  2.5 11/20/2019 0940   MONOABS 1.0 11/20/2019 0940   EOSABS 0.1 11/20/2019 0940   BASOSABS 0.0 11/20/2019 0940    BMET    Component Value Date/Time   NA 137 03/26/2020 1520   K 4.4 03/26/2020 1520   CL 104 03/26/2020 1520   CO2 22 03/26/2020 1520   GLUCOSE 93 03/26/2020 1520   BUN 14 03/26/2020 1520   CREATININE 0.73 03/26/2020 1520   CALCIUM 9.2 03/26/2020 1520   GFRNONAA >60 11/20/2019 0940   GFRAA >60 06/26/2017 1043    BNP    Component Value Date/Time   BNP 194.6 (H) 04/23/2014 1223    ProBNP No results found for: PROBNP  Imaging: DG Chest 2 View  Result Date: 04/09/2020 CLINICAL DATA:  Asthmatic bronchitis exacerbation. EXAM: CHEST - 2 VIEW COMPARISON:  11/20/2019 and CT chest 11/13/2019. FINDINGS: Trachea is midline. Heart size normal. Thoracic aorta is calcified. Lungs are somewhat hyperinflated but clear. No pleural fluid. IMPRESSION: Hyperinflation without acute finding. Electronically Signed   By: Lorin Picket M.D.   On: 04/09/2020 12:25     Assessment & Plan:   Asthma - Prolonged exacerbation of asthmatic bronchitis. Symptoms started >10 days ago. No improvement with Zpack. Continues to have sinus congestion, chest congestion, HA, low grade temp and shortness of breath. CXR today showed hyperinflation without acute process. Sending in RX for Augmentin 1 tab BID x 7 days d/t acute sinobronchitis symptoms. She had left over prednisone so we extended her taper an additional 6 days. She will start 20mg  tomorrow x 2 days and then 10mg  x 2 days. Continue Symbicort 160  two puff twice daily and prn albuterol hfa. q 6 hours. Advised patient to take mucinex 600-1200mg  twice a day. FU if not better with above plan in 7-10 days. May consider changing Symbicort to [facility] d/t smoking hx.     Martyn Ehrich, NP 04/09/2020

## 2020-04-09 NOTE — Progress Notes (Signed)
Reviewed and agree with assessment/plan.   Chesley Mires, MD [facility] Pulmonary/Critical Care 04/09/2020, 2:58 PM Pager:  [phone]

## 2020-04-09 NOTE — Patient Instructions (Signed)
Treating you for exacerbation of asthmatic bronchitis  Recommendations: Continue Symbicort two puffs twice daily Use albuterol nebulizer first thing in the morning, again at lunch time and then at bedtime Continue oxygen at night and prn during the day Continue mucinex 600-1200mg  twice a day  Continue prednisone taper as directed (20mg  x 2 days; 10mg  x 2 days)  Orders: CXR today   Rx: Augmentin 1 tab twice daily x 10 days   Follow-up: If not better

## 2020-04-09 NOTE — Assessment & Plan Note (Addendum)
-   Prolonged exacerbation of asthmatic bronchitis. Symptoms started >10 days ago. No improvement with Zpack. Continues to have sinus congestion, chest congestion, HA, low grade temp and shortness of breath. CXR today showed hyperinflation without acute process. Sending in RX for Augmentin 1 tab BID x 7 days d/t acute sinobronchitis symptoms. She had left over prednisone so we extended her taper an additional 6 days. She will start 20mg  tomorrow x 2 days and then 10mg  x 2 days. Continue Symbicort 160 two puff twice daily and prn albuterol hfa. q 6 hours. Advised patient to take mucinex 600-1200mg  twice a day. FU if not better with above plan in 7-10 days. May consider changing Symbicort to [facility] d/t smoking hx.

## 2020-04-16 ENCOUNTER — Ambulatory Visit: Payer: 59 | Admitting: Physician Assistant

## 2020-04-16 ENCOUNTER — Encounter: Payer: 59 | Admitting: Physician Assistant

## 2020-04-19 ENCOUNTER — Other Ambulatory Visit: Payer: Self-pay

## 2020-04-20 ENCOUNTER — Encounter: Payer: Self-pay | Admitting: Family Medicine

## 2020-04-20 ENCOUNTER — Ambulatory Visit (INDEPENDENT_AMBULATORY_CARE_PROVIDER_SITE_OTHER): Payer: 59 | Admitting: Family Medicine

## 2020-04-20 VITALS — BP 124/66 | HR 99 | Temp 98.0°F | Ht 59.75 in | Wt 151.0 lb

## 2020-04-20 DIAGNOSIS — F5101 Primary insomnia: Secondary | ICD-10-CM | POA: Diagnosis not present

## 2020-04-20 DIAGNOSIS — F419 Anxiety disorder, unspecified: Secondary | ICD-10-CM

## 2020-04-20 MED ORDER — HYDROXYZINE HCL 50 MG PO TABS: 75.0000 mg | ORAL_TABLET | Freq: Every day | ORAL | 1 refills | Status: DC

## 2020-04-20 MED ORDER — ESCITALOPRAM OXALATE 20 MG PO TABS: 20.0000 mg | ORAL_TABLET | Freq: Every day | ORAL | 5 refills | Status: DC

## 2020-04-20 NOTE — Patient Instructions (Signed)
Increased vistaril dose  And start lexapro once daily..   Hope this helps.

## 2020-04-20 NOTE — Progress Notes (Signed)
Patient ID: Gabrielle [name], female  DOB: 07-21-1955, 65 y.o.   MRN: [id number] Patient Care Team    Relationship Specialty Notifications Start End  Ma Hillock, DO PCP - General Family Medicine  03/26/20   Everardo All, MD Consulting Physician Oncology  04/06/14   Chesley Mires, MD Consulting Physician Pulmonary Disease  03/14/19   Willia Craze, NP Nurse Practitioner Gastroenterology  03/14/19   Barbee Cough, MD Referring Physician Oncology  03/14/19   Brien Mates, RN Oncology Nurse Navigator Oncology  11/18/19     Chief Complaint  Patient presents with  . Anxiety    New onset    Subjective: Gabrielle [name] is a 65 y.o.  female present for anxiety Primary insomnia/anxiety She reports since her last visit she is now having trouble falling asleep.  She does endorse staying asleep longer with the change in medication to Ambien 12.5 mg extended release and hydroxyzine 50 mg nightly.  She does endorse more anxiety since last time we saw each other.  She states she lives with her ex husband, and they live as "roommates.  "She thinks he is having early signs of dementia which makes communicating difficult between the 2 of them and they disagree often and are arguing.  She lost her prior job and has started a new job which she states is "scary.  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  She also feels like she is becoming overwhelmed and forgetful herself.  She reports forgetting things like her keys or her water bottle etc. when she is going out of the house.  She has been tried on BuSpar, Belsomra and trazodone in the past. Prior note: Patient reports she has had difficulty sleeping for many years and has been tried on many different medications.  She was taking trazodone 300 mg nightly, but needed this medication change due to formulary change.  She has been tried on [employer] which did not  work well.  She states the Ambien 10 mg helps but she still wakes up multiple times throughout the night.  She also is prescribed Vistaril 50 mg nightly.  She denies oversedation.  Depression screen [facility] 2/9 04/20/2020 03/26/2020  Decreased Interest 0 0  Down, Depressed, Hopeless 0 0  PHQ - 2 Score 0 0  Altered sleeping 3 -  Tired, decreased energy 0 -  Change in appetite 0 -  Feeling bad or failure about yourself  0 -  Trouble concentrating 1 -  Moving slowly or fidgety/restless 0 -  Suicidal thoughts 0 -  PHQ-9 Score 4 -   GAD 7 : Generalized Anxiety Score 04/20/2020  Nervous, Anxious, on Edge 3  Control/stop worrying 3  Worry too much - different things 3  Trouble relaxing 1  Restless 0  Easily annoyed or irritable 1  Afraid - awful might happen 3  Total GAD 7 Score 14       Fall Risk  03/14/2019  Falls in the past year? 0  Number falls in past yr: 0  Injury with Fall? 0  Follow up Falls evaluation completed   Immunization History  Administered Date(s) Administered  . Influenza Inj Mdck Quad Pf 10/25/2018  . Influenza Split 09/23/2013  . Influenza,inj,Quad PF,6+ Mos 11/05/2014, 12/31/2015, 11/05/2019  . Influenza-Unspecified 10/22/2016, 10/25/2018  . PFIZER(Purple Top)SARS-COV-2 Vaccination 05/02/2019, 06/03/2019, 01/19/2020  . Pneumococcal Polysaccharide-23 05/24/2015  . Tdap 04/11/2008  No exam data present  Past Medical History:  Diagnosis Date  . Anemia   . Arthritis   . Asthma   . Carpal tunnel syndrome    bil  . Carpal tunnel syndrome   . Chronic pain   . Chronic pain in left foot   . Cough   . Depression    espisodic  . Drug abuse ([place])   . Dyspnea    with exertion an have failed over night oximenrty - wears oxygen at hs  . GERD (gastroesophageal reflux disease)   . H/O eating disorder   . history of Substance abuse ([place])    Substance abuse in the past.    . Hyperlipidemia   . Hypertension   . Invasive ductal carcinoma of right breast ([place])  08/21/2012   Right  . Legally blind   . Lung cancer ([place]) 2016  . Orthostatic hypotension   . Pneumonia    03-13-17  . S/P bilateral breast implants 08/21/2012   Prior to cancer diagnosis   . Sleep apnea   . Tobacco abuse 08/21/2012   > 40 pack years   Allergies  Allergen Reactions  . Hydrocodone Other (See Comments)    Makes her hyper   Past Surgical History:  Procedure Laterality Date  . BREAST LUMPECTOMY Left 1990   [facility] / right was cancer  . BREAST LUMPECTOMY Right 2014  . BREAST LUMPECTOMY WITH SENTINEL LYMPH NODE BIOPSY Right 04/2012  . ESOPHAGOGASTRODUODENOSCOPY (EGD) WITH PROPOFOL N/A 05/10/2017   Procedure: ESOPHAGOGASTRODUODENOSCOPY (EGD) WITH PROPOFOL;  Surgeon: Milus Banister, MD;  Location: [facility];  Service: Endoscopy;  Laterality: N/A;  . LOBECTOMY Right 04/20/2014   Procedure: LOBECTOMY, RIGHT UPPER LOBE WITH PLACEMENT OF ONQ PAIN PUMP;  Surgeon: Grace Isaac, MD;  Location: [place];  Service: Thoracic;  Laterality: Right;  . NODE DISSECTION Right 04/20/2014   Procedure: NODE DISSECTION;  Surgeon: Grace Isaac, MD;  Location: [place];  Service: Thoracic;  Laterality: Right;  . PLACEMENT OF BREAST IMPLANTS  1993  . PLANTAR FASCIA RELEASE Left 1990s  . SHOULDER ARTHROSCOPY WITH SUBACROMIAL DECOMPRESSION, ROTATOR CUFF REPAIR AND BICEP TENDON REPAIR Right 06/27/2017   Procedure: RIGHT SHOULDER ARTHROSCOPY WITH DEBRIDEMENT, SUBACROMIAL DECOMPRESSION AND ROTATOR CUFF REPAIR, BICEP TENODESIS;  Surgeon: Hiram Gash, MD;  Location: [place];  Service: Orthopedics;  Laterality: Right;  . TONSILLECTOMY AND ADENOIDECTOMY  1965  . VAGINAL HYSTERECTOMY  1990  . VIDEO ASSISTED THORACOSCOPY (VATS)/WEDGE RESECTION Right 04/20/2014   Procedure: VIDEO ASSISTED THORACOSCOPY ;  Surgeon: Grace Isaac, MD;  Location: [place];  Service: Thoracic;  Laterality: Right;  Marland Kitchen VIDEO BRONCHOSCOPY N/A 04/20/2014   Procedure: VIDEO BRONCHOSCOPY;  Surgeon: Grace Isaac, MD;  Location[facility] OR;  Service: Thoracic;  Laterality: N/A;  . WEDGE RESECTION Right 04/20/2014   Procedure: WEDGE RESECTION, RIGHT UPPER LOBE;  Surgeon: Grace Isaac, MD;  Location: [place];  Service: Thoracic;  Laterality: Right;   Family History  Problem Relation Age of Onset  . Hypertension Mother   . Alcohol abuse Mother   . Arthritis Mother   . COPD Mother   . Depression Mother   . Heart disease Mother   . Hyperlipidemia Mother   . Mental illness Mother   . Hypertension Father   . Alcohol abuse Father   . Hearing loss Father   . Heart attack Father   . Heart disease Father   . Hyperlipidemia Father   . Stroke Father   .  Kidney disease Father   . Arthritis Father   . Depression Brother   . Mental illness Brother   . Hepatitis C Brother   . Hepatitis C Brother   . Alcohol abuse Sister   . Depression Sister   . Mental illness Sister   . Lymphoma Maternal Aunt 77  . Leukemia Maternal Grandmother        dx in her late 33s  . Alcohol abuse Maternal Grandmother   . Hearing loss Maternal Grandmother   . Heart disease Maternal Grandfather   . Heart disease Paternal Grandmother   . Birth defects Daughter    Social History   Social History Narrative   Marital status/children/pets: Divorced.   Education/employment: 2 years of college.  Works as a Surveyor, mining.       Allergies as of 04/20/2020      Reactions   Hydrocodone Other (See Comments)   Makes her hyper      Medication List       Accurate as of April 20, 2020 11:59 PM. If you have any questions, ask your nurse or doctor.        STOP taking these medications   amoxicillin-clavulanate 875-125 MG tablet Commonly known as: AUGMENTIN Stopped by: Howard Pouch, DO     TAKE these medications   albuterol 108 (90 Base) MCG/ACT inhaler Commonly known as: VENTOLIN HFA Inhale 2 puffs into the lungs every 6 (six) hours as needed for wheezing or shortness of breath.   albuterol (2.5 MG/3ML) 0.083% nebulizer  solution Commonly known as: PROVENTIL Take 3 mLs (2.5 mg total) by nebulization every 6 (six) hours as needed for wheezing or shortness of breath.   atorvastatin 40 MG tablet Commonly known as: LIPITOR Take 1 tablet (40 mg total) by mouth daily.   budesonide-formoterol 160-4.5 MCG/ACT inhaler Commonly known as: SYMBICORT Inhale 2 puffs into the lungs 2 (two) times daily.   cholecalciferol 25 MCG (1000 UNIT) tablet Commonly known as: VITAMIN D3 Take 1,000 Units by mouth daily.   escitalopram 20 MG tablet Commonly known as: Lexapro Take 1 tablet (20 mg total) by mouth daily. Started by: Howard Pouch, DO   gabapentin 800 MG tablet Commonly known as: NEURONTIN Take 1 tablet (800 mg total) by mouth daily.   hydrOXYzine 50 MG tablet Commonly known as: ATARAX/VISTARIL Take 1.5-2 tablets (75-100 mg total) by mouth at bedtime. What changed: how much to take Changed by: Howard Pouch, DO   lisinopril 20 MG tablet Commonly known as: ZESTRIL Take 1 tablet (20 mg total) by mouth daily.   omeprazole 40 MG capsule Commonly known as: PRILOSEC Take 1 capsule (40 mg total) by mouth daily.   zolpidem 12.5 MG CR tablet Commonly known as: Ambien CR Take 1 tablet (12.5 mg total) by mouth at bedtime as needed for sleep.       All past medical history, surgical history, allergies, family history, immunizations andmedications were updated in the EMR today and reviewed under the history and medication portions of their EMR.    Recent Results (from the past 2160 hour(s))  Comp Met (CMET)     Status: Abnormal   Collection Time: 03/26/20  3:20 PM  Result Value Ref Range   Glucose, Bld 93 65 - 99 mg/dL    Comment: .            Fasting reference interval .    BUN 14 7 - 25 mg/dL   Creat 0.73 0.50 - 0.99 mg/dL  Comment: For patients >56 years of age, the reference limit for Creatinine is approximately 13% higher for people identified as African-American. .    BUN/Creatinine Ratio NOT  APPLICABLE 6 - 22 (calc)   Sodium 137 135 - 146 mmol/L   Potassium 4.4 3.5 - 5.3 mmol/L   Chloride 104 98 - 110 mmol/L   CO2 22 20 - 32 mmol/L   Calcium 9.2 8.6 - 10.4 mg/dL   Total Protein 5.8 (L) 6.1 - 8.1 g/dL   Albumin 4.3 3.6 - 5.1 g/dL   Globulin 1.5 (L) 1.9 - 3.7 g/dL (calc)   AG Ratio 2.9 (H) 1.0 - 2.5 (calc)   Total Bilirubin 0.4 0.2 - 1.2 mg/dL   Alkaline phosphatase (APISO) 86 37 - 153 U/L   AST 26 10 - 35 U/L   ALT 22 6 - 29 U/L  Lipid panel     Status: Abnormal   Collection Time: 03/26/20  3:20 PM  Result Value Ref Range   Cholesterol 168 <200 mg/dL   HDL 77 > OR = 50 mg/dL   Triglycerides 191 (H) <150 mg/dL   LDL Cholesterol (Calc) 65 mg/dL (calc)    Comment: Reference range: <100 . Desirable range <100 mg/dL for primary prevention;   <70 mg/dL for patients with CHD or diabetic patients  with > or = 2 CHD risk factors. Marland Kitchen LDL-C is now calculated using the Martin-Hopkins  calculation, which is a validated novel method providing  better accuracy than the Friedewald equation in the  estimation of LDL-C.  Cresenciano Genre et al. Annamaria Helling. 8828;003(49): 2061-2068  (http://education.[facility].com/faq/FAQ164)    Total CHOL/HDL Ratio 2.2 <5.0 (calc)   Non-HDL Cholesterol (Calc) 91 <130 mg/dL (calc)    Comment: For patients with diabetes plus 1 major ASCVD risk  factor, treating to a non-HDL-C goal of <100 mg/dL  (LDL-C of <70 mg/dL) is considered a therapeutic  option.   TSH     Status: None   Collection Time: 03/26/20  3:20 PM  Result Value Ref Range   TSH 2.35 0.40 - 4.50 mIU/L      ROS: 14 pt review of systems performed and negative (unless mentioned in an HPI)  Objective: BP 124/66   Pulse 99   Temp 98 F (36.7 C) (Oral)   Ht 4' 11.75" (1.518 m)   Wt 151 lb (68.5 kg)   SpO2 96%   BMI 29.74 kg/m  Gen: Afebrile. No acute distress.  HENT: AT. [place].  Eyes:Pupils Equal Round Reactive to light, Extraocular movements intact,  Conjunctiva without redness,  discharge or icterus. Neck/lymp/endocrine: Supple, no lymphadenopathy, no thyromegaly  Neuro:  Normal gait. PERLA. EOMi. Alert. Oriented x3 Psych: Normal affect, dress and demeanor. Normal speech. Normal thought content and judgment.  Assessment/plan: LISSY DEUSER is a 65 y.o. female present for Primary insomnia/anxiety (new) Patient seems to have a high tolerance for sedate of medications.  She has a past history of substance abuse, not currently an issue for her. increase Vistaril to 75-100 mg nightly Continue Ambien to 12.5 mg Start Lexapro 20 mg daily Longer symptoms are well controlled follow-up with her other chronic conditions No orders of the defined types were placed in this encounter.  Meds ordered this encounter  Medications  . hydrOXYzine (ATARAX/VISTARIL) 50 MG tablet    Sig: Take 1.5-2 tablets (75-100 mg total) by mouth at bedtime.    Dispense:  180 tablet    Refill:  1  . escitalopram (LEXAPRO) 20 MG tablet  Sig: Take 1 tablet (20 mg total) by mouth daily.    Dispense:  30 tablet    Refill:  5   Referral Orders  No referral(s) requested today     Note is dictated utilizing voice recognition software. Although note has been proof read prior to signing, occasional typographical errors still can be missed. If any questions arise, please do not hesitate to call for verification.  Electronically signed by: Howard Pouch, DO [place]

## 2020-05-11 ENCOUNTER — Ambulatory Visit: Payer: 59 | Admitting: Family Medicine

## 2020-05-25 ENCOUNTER — Inpatient Hospital Stay (HOSPITAL_COMMUNITY): Payer: 59 | Attending: Hematology

## 2020-05-25 ENCOUNTER — Other Ambulatory Visit: Payer: Self-pay

## 2020-05-25 DIAGNOSIS — Z170 Estrogen receptor positive status [ER+]: Secondary | ICD-10-CM | POA: Insufficient documentation

## 2020-05-25 DIAGNOSIS — Z853 Personal history of malignant neoplasm of breast: Secondary | ICD-10-CM | POA: Diagnosis present

## 2020-05-25 DIAGNOSIS — Z79899 Other long term (current) drug therapy: Secondary | ICD-10-CM | POA: Diagnosis not present

## 2020-05-25 DIAGNOSIS — Z923 Personal history of irradiation: Secondary | ICD-10-CM | POA: Insufficient documentation

## 2020-05-25 DIAGNOSIS — Z85118 Personal history of other malignant neoplasm of bronchus and lung: Secondary | ICD-10-CM | POA: Insufficient documentation

## 2020-05-25 DIAGNOSIS — M8580 Other specified disorders of bone density and structure, unspecified site: Secondary | ICD-10-CM | POA: Insufficient documentation

## 2020-05-25 DIAGNOSIS — C50911 Malignant neoplasm of unspecified site of right female breast: Secondary | ICD-10-CM

## 2020-05-25 DIAGNOSIS — C3411 Malignant neoplasm of upper lobe, right bronchus or lung: Secondary | ICD-10-CM

## 2020-05-25 LAB — CBC WITH DIFFERENTIAL/PLATELET
Abs Immature Granulocytes: 0.03 10*3/uL (ref 0.00–0.07)
Basophils Absolute: 0 10*3/uL (ref 0.0–0.1)
Basophils Relative: 0 %
Eosinophils Absolute: 0.1 10*3/uL (ref 0.0–0.5)
Eosinophils Relative: 1 %
HCT: 32.7 % — ABNORMAL LOW (ref 36.0–46.0)
Hemoglobin: 11.6 g/dL — ABNORMAL LOW (ref 12.0–15.0)
Immature Granulocytes: 0 %
Lymphocytes Relative: 23 %
Lymphs Abs: 2.1 10*3/uL (ref 0.7–4.0)
MCH: 32 pg (ref 26.0–34.0)
MCHC: 35.5 g/dL (ref 30.0–36.0)
MCV: 90.3 fL (ref 80.0–100.0)
Monocytes Absolute: 0.6 10*3/uL (ref 0.1–1.0)
Monocytes Relative: 6 %
Neutro Abs: 6.6 10*3/uL (ref 1.7–7.7)
Neutrophils Relative %: 70 %
Platelets: 303 10*3/uL (ref 150–400)
RBC: 3.62 MIL/uL — ABNORMAL LOW (ref 3.87–5.11)
RDW: 13.2 % (ref 11.5–15.5)
WBC: 9.4 10*3/uL (ref 4.0–10.5)
nRBC: 0 % (ref 0.0–0.2)

## 2020-05-25 LAB — COMPREHENSIVE METABOLIC PANEL
ALT: 30 U/L (ref 0–44)
AST: 32 U/L (ref 15–41)
Albumin: 3.7 g/dL (ref 3.5–5.0)
Alkaline Phosphatase: 98 U/L (ref 38–126)
Anion gap: 9 (ref 5–15)
BUN: 7 mg/dL — ABNORMAL LOW (ref 8–23)
CO2: 27 mmol/L (ref 22–32)
Calcium: 8.9 mg/dL (ref 8.9–10.3)
Chloride: 87 mmol/L — ABNORMAL LOW (ref 98–111)
Creatinine, Ser: 0.53 mg/dL (ref 0.44–1.00)
GFR, Estimated: >60 mL/min (ref >60)
Glucose, Bld: 122 mg/dL — ABNORMAL HIGH (ref 70–99)
Potassium: 4 mmol/L (ref 3.5–5.1)
Sodium: 123 mmol/L — ABNORMAL LOW (ref 135–145)
Total Bilirubin: 0.6 mg/dL (ref 0.3–1.2)
Total Protein: 6.3 g/dL — ABNORMAL LOW (ref 6.5–8.1)

## 2020-05-25 LAB — VITAMIN D 25 HYDROXY (VIT D DEFICIENCY, FRACTURES): Vit D, 25-Hydroxy: 41.67 ng/mL (ref 30–100)

## 2020-06-01 ENCOUNTER — Encounter (HOSPITAL_COMMUNITY): Payer: Self-pay

## 2020-06-01 ENCOUNTER — Other Ambulatory Visit: Payer: Self-pay

## 2020-06-01 ENCOUNTER — Inpatient Hospital Stay (HOSPITAL_BASED_OUTPATIENT_CLINIC_OR_DEPARTMENT_OTHER): Payer: 59 | Admitting: Hematology

## 2020-06-01 VITALS — BP 142/72 | HR 92 | Temp 97.0°F | Resp 18 | Wt 146.8 lb

## 2020-06-01 DIAGNOSIS — M8580 Other specified disorders of bone density and structure, unspecified site: Secondary | ICD-10-CM | POA: Diagnosis not present

## 2020-06-01 DIAGNOSIS — C50911 Malignant neoplasm of unspecified site of right female breast: Secondary | ICD-10-CM | POA: Diagnosis not present

## 2020-06-01 DIAGNOSIS — C3411 Malignant neoplasm of upper lobe, right bronchus or lung: Secondary | ICD-10-CM | POA: Diagnosis not present

## 2020-06-01 DIAGNOSIS — Z780 Asymptomatic menopausal state: Secondary | ICD-10-CM

## 2020-06-01 DIAGNOSIS — Z853 Personal history of malignant neoplasm of breast: Secondary | ICD-10-CM | POA: Diagnosis not present

## 2020-06-01 NOTE — Progress Notes (Signed)
[facility] [address] [place] [address]   Patient Care Team: Ma Hillock, DO as PCP - General (Family Medicine) Everardo All, MD as Consulting Physician (Oncology) Chesley Mires, MD as Consulting Physician (Pulmonary Disease) Willia Craze, NP as Nurse Practitioner (Gastroenterology) Barbee Cough, MD as Referring Physician (Oncology) Brien Mates, RN as Oncology Nurse Navigator (Oncology)  SUMMARY OF ONCOLOGIC HISTORY: Oncology History  Invasive ductal carcinoma of right breast ([place])  08/21/2012 Initial Diagnosis   Invasive ductal carcinoma of right breast ([place])   03/20/2016 Miscellaneous   Breast cancer index motor but patient declined to proceed with test.     CHIEF COMPLIANT: Follow-up for stage I right breast cancer and right lung adenocarcinoma   INTERVAL HISTORY: [name] is a 65 y.o. female here today for follow up of her stage I right breast cancer and right lung adenocarcinoma. Her last visit was on 11/20/2019.   She reports having low appetite and low energy that improved after eatting salt.She reports 2 sinus infections 6-8 months apart that were treated with antibiotics and steroids; she denies previous infections. These infections turned into acute asthmatic bronchitis. No antibiotic use in the last month. She had a sleep study which showed mild sleep apnea; she is now using a portable CPAP which has helped. She reports weight loss of 30 lbs over 3 years, but attributes it to exercise with bike. She is taking vitamin D3, maybe 1000 units.   REVIEW OF SYSTEMS:   Review of Systems  Constitutional: Positive for appetite change (50%) and fatigue (50%).  Gastrointestinal: Positive for constipation.  Psychiatric/Behavioral: Positive for sleep disturbance (mild sleep apnea).    I have reviewed the past medical history, past surgical history, social history and family history with the patient and they are  unchanged from previous note.   ALLERGIES:   is allergic to hydrocodone.   MEDICATIONS:  Current Outpatient Medications  Medication Sig Dispense Refill  . albuterol (PROVENTIL) (2.5 MG/3ML) 0.083% nebulizer solution Take 3 mLs (2.5 mg total) by nebulization every 6 (six) hours as needed for wheezing or shortness of breath. 75 mL 3  . atorvastatin (LIPITOR) 40 MG tablet Take 1 tablet (40 mg total) by mouth daily. 90 tablet 1  . budesonide-formoterol (SYMBICORT) 160-4.5 MCG/ACT inhaler Inhale 2 puffs into the lungs 2 (two) times daily. 10.2 g 5  . cholecalciferol (VITAMIN D3) 25 MCG (1000 UNIT) tablet Take 1,000 Units by mouth daily.    Marland Kitchen escitalopram (LEXAPRO) 20 MG tablet Take 1 tablet (20 mg total) by mouth daily. 30 tablet 5  . gabapentin (NEURONTIN) 800 MG tablet Take 1 tablet (800 mg total) by mouth daily. 90 tablet 1  . hydrOXYzine (ATARAX/VISTARIL) 50 MG tablet Take 1.5-2 tablets (75-100 mg total) by mouth at bedtime. 180 tablet 1  . lisinopril (ZESTRIL) 20 MG tablet Take 1 tablet (20 mg total) by mouth daily. 90 tablet 1  . omeprazole (PRILOSEC) 40 MG capsule Take 1 capsule (40 mg total) by mouth daily. 90 capsule 1  . zolpidem (AMBIEN CR) 12.5 MG CR tablet Take 1 tablet (12.5 mg total) by mouth at bedtime as needed for sleep. 90 tablet 1  . albuterol (VENTOLIN HFA) 108 (90 Base) MCG/ACT inhaler Inhale 2 puffs into the lungs every 6 (six) hours as needed for wheezing or shortness of breath. (Patient not taking: Reported on 06/01/2020) 6.7 g 5   No current facility-administered medications for this visit.  PHYSICAL EXAMINATION: Performance status (ECOG): 1 - Symptomatic but completely ambulatory  Vitals:   06/01/20 1137  BP: (!) 142/72  Pulse: 92  Resp: 18  Temp: (!) 97 F (36.1 C)  SpO2: 93%   Wt Readings from Last 3 Encounters:  06/01/20 146 lb 12.8 oz (66.6 kg)  04/20/20 151 lb (68.5 kg)  04/09/20 150 lb (68 kg)   Physical Exam Constitutional:      Appearance:  Normal appearance.  Cardiovascular:     Rate and Rhythm: Normal rate and regular rhythm.     Pulses: Normal pulses.     Heart sounds: Normal heart sounds.  Pulmonary:     Effort: Pulmonary effort is normal.     Breath sounds: Normal breath sounds.  Chest:  Breasts:     Right: No axillary adenopathy.     Left: No axillary adenopathy.    Abdominal:     Palpations: Abdomen is soft. There is no hepatomegaly, splenomegaly or mass.     Tenderness: There is no abdominal tenderness.  Musculoskeletal:     Right lower leg: No edema.     Left lower leg: No edema.  Lymphadenopathy:     Upper Body:     Right upper body: No axillary adenopathy.     Left upper body: No axillary adenopathy.  Neurological:     General: No focal deficit present.     Mental Status: She is alert and oriented to person, place, and time.  Psychiatric:        Mood and Affect: Mood normal.        Behavior: Behavior normal.     Breast Exam Chaperone: Milinda Antis, MD     LABORATORY DATA:  I have reviewed the data as listed CMP Latest Ref Rng & Units 05/25/2020 03/26/2020 11/20/2019  Glucose 70 - 99 mg/dL 122(H) 93 92  BUN 8 - 23 mg/dL 7(L) 14 8  Creatinine 0.44 - 1.00 mg/dL 0.53 0.73 0.63  Sodium 135 - 145 mmol/L 123(L) 137 128(L)  Potassium 3.5 - 5.1 mmol/L 4.0 4.4 3.7  Chloride 98 - 111 mmol/L 87(L) 104 89(L)  CO2 22 - 32 mmol/L 27 22 26   Calcium 8.9 - 10.3 mg/dL 8.9 9.2 9.4  Total Protein 6.5 - 8.1 g/dL 6.3(L) 5.8(L) 7.5  Total Bilirubin 0.3 - 1.2 mg/dL 0.6 0.4 0.7  Alkaline Phos 38 - 126 U/L 98 - 88  AST 15 - 41 U/L 32 26 27  ALT 0 - 44 U/L 30 22 26    No results found for: EGB151 Lab Results  Component Value Date   WBC 9.4 05/25/2020   HGB 11.6 (L) 05/25/2020   HCT 32.7 (L) 05/25/2020   MCV 90.3 05/25/2020   PLT 303 05/25/2020   NEUTROABS 6.6 05/25/2020   Lab Results  Component Value Date   VD25OH 41.67 05/25/2020   VD25OH 32.56 11/20/2019    ASSESSMENT:  1.  Stage I, grade 1 ER  positive, HER-2 negative right breast cancer: -Lumpectomy and SLNB on 05/03/2012.  Status post XRT. -Anastrozole completed in June 2019. -Last mammogram was in 2019 at [address] [facility] in [place].  2.  Stage Ia (T1b N0 M0) right upper lobe lung adenocarcinoma: -Video bronchoscopy, left video-assisted thoracoscopy on 04/20/2014 with wedge resection of the right upper lobe lung nodule and a right upper lobectomy with lymph node dissection.  Pathology with negative margins and negative LVI. -CT chest without contrast on 11/13/2019 showed stable postoperative changes of right upper lobectomy with no evidence  of recurrence or metastatic disease.  Upper pole of right kidney cyst stable from prior imaging.  3.  Social/family history: -She works as a Surveyor, mining.  She quit smoking in 2016. -Father had prostate cancer.  PLAN:  1.  Stage I right breast cancer: -Breast implant examination was not done today.  However examination was negative for lymphadenopathy in the axillary and supraclavicular region.  No clinical signs of recurrence at this time.  Reviewed labs from 05/25/2020 which showed normal LFTs and CBC.  She has noticed sodium was low at last visit and started eating more salt.  She reported improvement in energy level since she started eating solid. - She was told to have mammogram again in November of the breast with implants. - She wants to have it done at [facility].  We will see her back after the mammogram.   2.  Right upper lobe lung cancer: -She does not have any clinical signs of recurrence at this time. - Chest x-ray from 04/09/2020 did not show any suspicious areas. - Recommend CT chest without contrast in 6 months.  3.  Osteopenia: -Last DEXA scan on 02/11/2016 with T score -2.0.  Vitamin D level was normal. - We will repeat DEXA scan prior to next visit in 6 months.  She wants to have it done at [facility].  Breast Cancer therapy associated bone loss: I have recommended calcium,  Vitamin D and weight bearing exercises.  Orders placed this encounter:  Orders Placed This Encounter  Procedures  . CBC with Differential/Platelet  . Comprehensive metabolic panel  . VITAMIN D 25 Hydroxy (Vit-D Deficiency, Fractures)    The patient has a good understanding of the overall plan. She agrees with it. She will call with any problems that may develop before the next visit here.  Derek Jack, MD [facility] [phone]   I, Thana Ates, am acting as a scribe for Dr. Sanda Linger.  I, Derek Jack MD, have reviewed the above documentation for accuracy and completeness, and I agree with the above.

## 2020-06-01 NOTE — Progress Notes (Deleted)
[facility]ri[name] [address], [place] [address]   Patient Care Team: Gabrielle Hillock, DO as PCP - General (Family Medicine) Gabrielle All, MD as Consulting Physician (Oncology) Gabrielle Mires, MD as Consulting Physician (Pulmonary Disease) Gabrielle Craze, NP as Nurse Practitioner (Gastroenterology) Gabrielle Cough, MD as Referring Physician (Oncology) Gabrielle Mates, RN as Oncology Nurse Navigator (Oncology)  SUMMARY OF ONCOLOGIC HISTORY: Oncology History  Invasive ductal carcinoma of right breast ([name])  08/21/2012 Initial Diagnosis   Invasive ductal carcinoma of right breast ([name])   03/20/2016 Miscellaneous   Breast cancer index motor but patient declined to proceed with test.     CHIEF COMPLIANT: follow-up for right breast cancer and right lung cancer   INTERVAL HISTORY: [name] is a 65 y.o. female here today for follow up of her ***. Her last visit was on 11/20/2019. ***   REVIEW OF SYSTEMS:   Review of Systems - Oncology  I have reviewed the past medical history, past surgical history, social history and family history with the patient and they are unchanged from previous note.   ALLERGIES:   is allergic to hydrocodone.   MEDICATIONS:  Current Outpatient Medications  Medication Sig Dispense Refill  . albuterol (PROVENTIL) (2.5 MG/3ML) 0.083% nebulizer solution Take 3 mLs (2.5 mg total) by nebulization every 6 (six) hours as needed for wheezing or shortness of breath. 75 mL 3  . albuterol (VENTOLIN HFA) 108 (90 Base) MCG/ACT inhaler Inhale 2 puffs into the lungs every 6 (six) hours as needed for wheezing or shortness of breath. 6.7 g 5  . atorvastatin (LIPITOR) 40 MG tablet Take 1 tablet (40 mg total) by mouth daily. 90 tablet 1  . budesonide-formoterol (SYMBICORT) 160-4.5 MCG/ACT inhaler Inhale 2 puffs into the lungs 2 (two) times daily. 10.2 g 5  . cholecalciferol (VITAMIN D3) 25 MCG (1000 UNIT) tablet Take 1,000 Units by  mouth daily.    Marland Kitchen escitalopram (LEXAPRO) 20 MG tablet Take 1 tablet (20 mg total) by mouth daily. 30 tablet 5  . gabapentin (NEURONTIN) 800 MG tablet Take 1 tablet (800 mg total) by mouth daily. 90 tablet 1  . hydrOXYzine (ATARAX/VISTARIL) 50 MG tablet Take 1.5-2 tablets (75-100 mg total) by mouth at bedtime. 180 tablet 1  . lisinopril (ZESTRIL) 20 MG tablet Take 1 tablet (20 mg total) by mouth daily. 90 tablet 1  . omeprazole (PRILOSEC) 40 MG capsule Take 1 capsule (40 mg total) by mouth daily. 90 capsule 1  . zolpidem (AMBIEN CR) 12.5 MG CR tablet Take 1 tablet (12.5 mg total) by mouth at bedtime as needed for sleep. 90 tablet 1   No current facility-administered medications for this visit.     PHYSICAL EXAMINATION: Performance status (ECOG): {CHL ONC XQ:[id number]}  Vitals:   06/01/20 1137  BP: (!) 142/72  Pulse: 92  Resp: 18  Temp: (!) 97 F (36.1 C)  SpO2: 93%   Wt Readings from Last 3 Encounters:  06/01/20 146 lb 12.8 oz (66.6 kg)  04/20/20 151 lb (68.5 kg)  04/09/20 150 lb (68 kg)   Physical Exam  Breast Exam Chaperone: {Blank single:[id number]::"n/a","Gabrielle Khashchuk, MD"}     LABORATORY DATA:  I have reviewed the data as listed CMP Latest Ref Rng & Units 05/25/2020 03/26/2020 11/20/2019  Glucose 70 - 99 mg/dL 122(H) 93 92  BUN 8 - 23 mg/dL 7(L) 14 8  Creatinine 0.44 - 1.00 mg/dL 0.53 0.73 0.63  Sodium 135 - 145 mmol/L  123(L) 137 128(L)  Potassium 3.5 - 5.1 mmol/L 4.0 4.4 3.7  Chloride 98 - 111 mmol/L 87(L) 104 89(L)  CO2 22 - 32 mmol/L 27 22 26   Calcium 8.9 - 10.3 mg/dL 8.9 9.2 9.4  Total Protein 6.5 - 8.1 g/dL 6.3(L) 5.8(L) 7.5  Total Bilirubin 0.3 - 1.2 mg/dL 0.6 0.4 0.7  Alkaline Phos 38 - 126 U/L 98 - 88  AST 15 - 41 U/L 32 26 27  ALT 0 - 44 U/L 30 22 26    No results found for: FFK922 Lab Results  Component Value Date   WBC 9.4 05/25/2020   HGB 11.6 (L) 05/25/2020   HCT 32.7 (L) 05/25/2020   MCV 90.3 05/25/2020   PLT 303 05/25/2020   NEUTROABS 6.6  05/25/2020    ASSESSMENT:  ***   PLAN:  ***  Breast Cancer therapy associated bone loss: I have recommended calcium, Vitamin D and weight bearing exercises.  Orders placed this encounter:  No orders of the defined types were placed in this encounter.   The patient has a good understanding of the overall plan. She agrees with it. She will call with any problems that may develop before the next visit here.  Gabrielle Jack, MD [facility] [phone]   I, ***, am acting as a scribe for Dr. Sanda Linger.  {Add Barista Statement}

## 2020-06-01 NOTE — Patient Instructions (Signed)
[place] at [facility] Discharge Instructions  You were seen today by Dr. Delton Coombes. He went over your recent results. You will be scheduled for a bone density scan prior to your next visit, and a mammogram after 12/04/2020. Continue taking vitamin D3. Dr. Delton Coombes will see you back in 6 months for labs and follow up.   Thank you for choosing [place] at [facility] to provide your oncology and hematology care.  To afford each patient quality time with our provider, please arrive at least 15 minutes before your scheduled appointment time.   If you have a lab appointment with the [facility] please come in thru the Main Entrance and check in at the main information desk  You need to re-schedule your appointment should you arrive 10 or more minutes late.  We strive to give you quality time with our providers, and arriving late affects you and other patients whose appointments are after yours.  Also, if you no show three or more times for appointments you may be dismissed from the clinic at the providers discretion.     Again, thank you for choosing [facility].  Our hope is that these requests will decrease the amount of time that you wait before being seen by our physicians.       _____________________________________________________________  Should you have questions after your visit to [facility], please contact our office at [phone] between the hours of 8:00 a.m. and 4:30 p.m.  Voicemails left after 4:00 p.m. will not be returned until the following business day.  For prescription refill requests, have your pharmacy contact our office and allow 72 hours.    Cancer Center Support Programs:   > Cancer Support Group  2nd Tuesday of the month 1pm-2pm, Journey Room

## 2020-07-02 ENCOUNTER — Other Ambulatory Visit: Payer: Self-pay | Admitting: Pulmonary Disease

## 2020-09-05 ENCOUNTER — Other Ambulatory Visit: Payer: Self-pay | Admitting: Family Medicine

## 2020-09-05 DIAGNOSIS — K219 Gastro-esophageal reflux disease without esophagitis: Secondary | ICD-10-CM

## 2020-09-05 DIAGNOSIS — I10 Essential (primary) hypertension: Secondary | ICD-10-CM

## 2020-09-06 ENCOUNTER — Telehealth: Payer: Self-pay

## 2020-09-06 ENCOUNTER — Other Ambulatory Visit: Payer: Self-pay

## 2020-09-06 DIAGNOSIS — J4520 Mild intermittent asthma, uncomplicated: Secondary | ICD-10-CM

## 2020-09-06 MED ORDER — BUDESONIDE-FORMOTEROL FUMARATE 160-4.5 MCG/ACT IN AERO: 2.0000 | INHALATION_SPRAY | Freq: Two times a day (BID) | RESPIRATORY_TRACT | 4 refills | Status: DC

## 2020-09-06 NOTE — Telephone Encounter (Signed)
Symbicort sent into pharmacy nothing further needed.

## 2020-09-07 ENCOUNTER — Other Ambulatory Visit: Payer: Self-pay | Admitting: Family Medicine

## 2020-09-07 DIAGNOSIS — I10 Essential (primary) hypertension: Secondary | ICD-10-CM

## 2020-09-09 ENCOUNTER — Telehealth: Payer: Self-pay

## 2020-09-09 DIAGNOSIS — I10 Essential (primary) hypertension: Secondary | ICD-10-CM

## 2020-09-09 DIAGNOSIS — K219 Gastro-esophageal reflux disease without esophagitis: Secondary | ICD-10-CM

## 2020-09-09 DIAGNOSIS — C50911 Malignant neoplasm of unspecified site of right female breast: Secondary | ICD-10-CM

## 2020-09-09 MED ORDER — LISINOPRIL 20 MG PO TABS: 20.0000 mg | ORAL_TABLET | Freq: Every day | ORAL | 0 refills | Status: DC

## 2020-09-09 MED ORDER — OMEPRAZOLE 40 MG PO CPDR: 40.0000 mg | DELAYED_RELEASE_CAPSULE | Freq: Every day | ORAL | 0 refills | Status: DC

## 2020-09-09 MED ORDER — GABAPENTIN 800 MG PO TABS: 800.0000 mg | ORAL_TABLET | Freq: Every day | ORAL | 0 refills | Status: DC

## 2020-09-09 NOTE — Telephone Encounter (Signed)
Patient states she is following up on refill request that she submitted to [facility] over 4 days ago.  [facility] tells patient there is a delay in our processing. Patient is now out of the meds, and wants to know how we can get her meds filled for her.  I did explain that Dr. Raoul Pitch is out of the office this week. Patient can be reached at [phone].  zolpidem (AMBIEN CR) 12.5 MG CR tablet [[id number]]   omeprazole (PRILOSEC) 40 MG capsule [[id number]]   lisinopril (ZESTRIL) 20 MG tablet [[id number]]   gabapentin (NEURONTIN) 800 MG tablet [[id number]]    [facility] [address] [place]

## 2020-09-09 NOTE — Telephone Encounter (Signed)
Pt called back and was transferred to me by FO. Pt reported being upset about not receiving refills after requesting twice this week. Apologized to pt and informed that the reason the medication has not been filled due to requesting meds too soon and having appt prior to needing appt. I informed pt that I will refill all meds requested except for the Ambien since it is controlled and Dr. Raoul Pitch is the one that would have to refill it. After reviewing the medication pt was informed that her 6 mos supply shouldn't be out until 09/26/20.  Pt is aware that PCP is out of the office.

## 2020-09-15 ENCOUNTER — Encounter: Payer: Self-pay | Admitting: Family Medicine

## 2020-09-15 ENCOUNTER — Ambulatory Visit (INDEPENDENT_AMBULATORY_CARE_PROVIDER_SITE_OTHER): Payer: Self-pay | Admitting: Family Medicine

## 2020-09-15 ENCOUNTER — Telehealth: Payer: Self-pay | Admitting: Family Medicine

## 2020-09-15 ENCOUNTER — Other Ambulatory Visit: Payer: Self-pay

## 2020-09-15 VITALS — BP 132/74 | HR 90 | Temp 98.7°F | Ht 60.0 in | Wt 153.0 lb

## 2020-09-15 DIAGNOSIS — Z780 Asymptomatic menopausal state: Secondary | ICD-10-CM

## 2020-09-15 DIAGNOSIS — G629 Polyneuropathy, unspecified: Secondary | ICD-10-CM

## 2020-09-15 DIAGNOSIS — I10 Essential (primary) hypertension: Secondary | ICD-10-CM

## 2020-09-15 DIAGNOSIS — F419 Anxiety disorder, unspecified: Secondary | ICD-10-CM | POA: Insufficient documentation

## 2020-09-15 DIAGNOSIS — C50911 Malignant neoplasm of unspecified site of right female breast: Secondary | ICD-10-CM

## 2020-09-15 DIAGNOSIS — E785 Hyperlipidemia, unspecified: Secondary | ICD-10-CM

## 2020-09-15 DIAGNOSIS — K219 Gastro-esophageal reflux disease without esophagitis: Secondary | ICD-10-CM

## 2020-09-15 DIAGNOSIS — Z23 Encounter for immunization: Secondary | ICD-10-CM

## 2020-09-15 DIAGNOSIS — M8580 Other specified disorders of bone density and structure, unspecified site: Secondary | ICD-10-CM

## 2020-09-15 DIAGNOSIS — E871 Hypo-osmolality and hyponatremia: Secondary | ICD-10-CM

## 2020-09-15 DIAGNOSIS — F5101 Primary insomnia: Secondary | ICD-10-CM

## 2020-09-15 MED ORDER — OMEPRAZOLE 40 MG PO CPDR: 40.0000 mg | DELAYED_RELEASE_CAPSULE | Freq: Every day | ORAL | 5 refills | Status: DC

## 2020-09-15 MED ORDER — GABAPENTIN 800 MG PO TABS: 800.0000 mg | ORAL_TABLET | Freq: Every day | ORAL | 5 refills | Status: DC

## 2020-09-15 MED ORDER — HYDROXYZINE HCL 50 MG PO TABS: 75.0000 mg | ORAL_TABLET | Freq: Every day | ORAL | 5 refills | Status: DC

## 2020-09-15 MED ORDER — LISINOPRIL 20 MG PO TABS: 20.0000 mg | ORAL_TABLET | Freq: Every day | ORAL | 5 refills | Status: DC

## 2020-09-15 MED ORDER — ATORVASTATIN CALCIUM 40 MG PO TABS: 40.0000 mg | ORAL_TABLET | Freq: Every day | ORAL | 5 refills | Status: DC

## 2020-09-15 MED ORDER — ZOLPIDEM TARTRATE ER 12.5 MG PO TBCR: 12.5000 mg | EXTENDED_RELEASE_TABLET | Freq: Every evening | ORAL | 5 refills | Status: DC | PRN

## 2020-09-15 MED ORDER — ESCITALOPRAM OXALATE 20 MG PO TABS: 20.0000 mg | ORAL_TABLET | Freq: Every day | ORAL | 5 refills | Status: DC

## 2020-09-15 MED ORDER — TRAZODONE HCL 300 MG PO TABS: 300.0000 mg | ORAL_TABLET | Freq: Every day | ORAL | 5 refills | Status: DC

## 2020-09-15 NOTE — Telephone Encounter (Signed)
Please call pt and obtain more information- Why does she want to switch back? Of course it is ok, but I need this for documentation.  Please  cancel the Ambien I sent to pharmacy today ASAP. Also, make her aware it is canceled- so she does not get upset if she tries to fill later or wants to go back to Ambien later and the script is not there.      - [place] [phone] trazodone 300 mg QHS ordered- not to be used with ambien. May want to start with a 1/2 tab for a few nights firs since she has not had this med in awhile.

## 2020-09-15 NOTE — Progress Notes (Addendum)
Patient ID: Gabrielle [name], female  DOB: 01/29/1955, 65 y.o.   MRN: [id number] Patient Care Team    Relationship Specialty Notifications Start End  Ma Hillock, DO PCP - General Family Medicine  03/26/20   Everardo All, MD Consulting Physician Oncology  04/06/14   Chesley Mires, MD Consulting Physician Pulmonary Disease  03/14/19   Willia Craze, NP Nurse Practitioner Gastroenterology  03/14/19   Barbee Cough, MD Referring Physician Oncology  03/14/19   Brien Mates, RN Oncology Nurse Navigator Oncology  11/18/19     Chief Complaint  Patient presents with   Anxiety    [place]; pt is not fasting    Subjective: Gabrielle [name] is a 65 y.o.  female present for  Primary hypertension/Hyperlipidemia LDL goal <70 Pt reports compliance with with lisinopril 20 mg daily and atorvastatin. Blood pressures ranges at home normal. Patient denies chest pain, shortness of breath, dizziness or lower extremity edema.   Pt does not daily baby ASA. Pt is  prescribed statin. RF: Hypertension, hyperlipidemia, former smoker, BMI greater than 30, family history   Gastroesophageal reflux disease without esophagitis Patient reports compliance with Prilosec 40 mg daily.  This helps control her symptoms.   Primary insomnia Patient reports Ambien CR 12.5 mg daily has been effective to help her sleep more restful throughout the night. Prior note: Patient reports she has had difficulty sleeping for many years and has been tried on many different medications.  She was taking trazodone 300 mg nightly, but needed this medication change due to formulary change.  She has been tried on [employer] which did not work well.  She states the Ambien 10 mg helps but she still wakes up multiple times throughout the night.  She also is prescribed Vistaril 50 mg nightly.  She denies oversedation. Primary insomnia/anxiety She reports since her last visit she is now having trouble falling asleep.  She does  endorse staying asleep longer with the change in medication to Ambien 12.5 mg extended release and hydroxyzine 50 mg nightly.  She does endorse more anxiety since last time we saw each other.  She states she lives with her ex husband, and they live as "roommates.  "She thinks he is having early signs of dementia which makes communicating difficult between the 2 of them and they disagree often and are arguing.  She lost her prior job and has started a new job which she states is "scary.  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  She also feels like she is becoming overwhelmed and forgetful herself.  She reports forgetting things like her keys or her water bottle etc. when she is going out of the house.  She has been tried on BuSpar, Belsomra and trazodone in the past. Prior note: Patient reports she has had difficulty sleeping for many years and has been tried on many different medications.  She was taking trazodone 300 mg nightly, but needed this medication change due to formulary change.  She has been tried on [employer] which did not work well.  She states the Ambien 10 mg helps but she still wakes up multiple times throughout the night.  She also is prescribed Vistaril 50 mg nightly.  She denies oversedation.  Depression screen  [facility] 2/9 09/15/2020 04/20/2020 03/26/2020  Decreased Interest 0 0 0  Down, Depressed, Hopeless 0 0 0  PHQ - 2 Score 0 0 0  Altered sleeping 1 3 -  Tired, decreased energy 0 0 -  Change in appetite 1 0 -  Feeling bad or failure about yourself  0 0 -  Trouble concentrating 1 1 -  Moving slowly or fidgety/restless 0 0 -  Suicidal thoughts 0 0 -  PHQ-9 Score 3 4 -   GAD 7 : Generalized Anxiety Score 09/15/2020 04/20/2020  Nervous, Anxious, on Edge 1 3  Control/stop worrying 0 3  Worry too much - different things 0 3  Trouble relaxing 0 1  Restless 0 0  Easily annoyed or irritable 1 1  Afraid - awful  might happen 1 3  Total GAD 7 Score 3 14       Fall Risk  03/14/2019  Falls in the past year? 0  Number falls in past yr: 0  Injury with Fall? 0  Follow up Falls evaluation completed   Immunization History  Administered Date(s) Administered   Influenza Inj Mdck Quad Pf 10/25/2018   Influenza Split 09/23/2013   Influenza,inj,Quad PF,6+ Mos 11/05/2014, 12/31/2015, 11/05/2019, 09/15/2020   Influenza-Unspecified 10/22/2016, 10/25/2018   PFIZER(Purple Top)SARS-COV-2 Vaccination 05/02/2019, 06/03/2019, 01/19/2020   Pneumococcal Polysaccharide-23 05/24/2015   Tdap 04/11/2008    No results found.  Past Medical History:  Diagnosis Date   Anemia    Arthritis    Asthma    Carpal tunnel syndrome    bil   Carpal tunnel syndrome    Chronic pain    Chronic pain in left foot    Cough    Depression    espisodic   Drug abuse ([place])    Dyspnea    with exertion an have failed over night oximenrty - wears oxygen at hs   GERD (gastroesophageal reflux disease)    H/O eating disorder    history of Substance abuse ([place])    Substance abuse in the past.     Hyperlipidemia    Hypertension    Invasive ductal carcinoma of right breast ([place]) 08/21/2012   Right   Legally blind    Lung cancer ([place]) 2016   Orthostatic hypotension    Pneumonia    03-13-17   S/P bilateral breast implants 08/21/2012   Prior to cancer diagnosis    Sleep apnea    Tobacco abuse 08/21/2012   > 40 pack years   Allergies  Allergen Reactions   Hydrocodone Other (See Comments)    Makes her hyper   Past Surgical History:  Procedure Laterality Date   BREAST LUMPECTOMY Left 1990   [facility] / right was cancer   BREAST LUMPECTOMY Right 2014   BREAST LUMPECTOMY WITH SENTINEL LYMPH NODE BIOPSY Right 04/2012   ESOPHAGOGASTRODUODENOSCOPY (EGD) WITH PROPOFOL N/A 05/10/2017   Procedure: ESOPHAGOGASTRODUODENOSCOPY (EGD) WITH PROPOFOL;  Surgeon: Milus Banister, MD;  Location: [facility];  Service: Endoscopy;  Laterality:  N/A;   LOBECTOMY Right 04/20/2014   Procedure: LOBECTOMY, RIGHT UPPER LOBE WITH PLACEMENT OF ONQ PAIN PUMP;  Surgeon: Grace Isaac, MD;  Location: [facility];  Service: Thoracic;  Laterality: Right;   NODE DISSECTION Right 04/20/2014   Procedure: NODE DISSECTION;  Surgeon: Grace Isaac, MD;  Location: [facility];  Service: Thoracic;  Laterality: Right;   [place] Left 1990s   SHOULDER ARTHROSCOPY WITH SUBACROMIAL DECOMPRESSION, ROTATOR CUFF REPAIR AND BICEP TENDON REPAIR Right 06/27/2017   Procedure: RIGHT SHOULDER ARTHROSCOPY WITH DEBRIDEMENT, SUBACROMIAL DECOMPRESSION AND ROTATOR CUFF REPAIR, BICEP TENODESIS;  Surgeon: Ophelia Charter  T, MD;  Location: [facility];  Service: Orthopedics;  Laterality: Right;   TONSILLECTOMY AND ADENOIDECTOMY  1965   VAGINAL HYSTERECTOMY  1990   VIDEO ASSISTED THORACOSCOPY (VATS)/WEDGE RESECTION Right 04/20/2014   Procedure: VIDEO ASSISTED THORACOSCOPY ;  Surgeon: Grace Isaac, MD;  Location: [facility];  Service: Thoracic;  Laterality: Right;   VIDEO BRONCHOSCOPY N/A 04/20/2014   Procedure: VIDEO BRONCHOSCOPY;  Surgeon: Grace Isaac, MD;  Location: [facility];  Service: Thoracic;  Laterality: N/A;   WEDGE RESECTION Right 04/20/2014   Procedure: WEDGE RESECTION, RIGHT UPPER LOBE;  Surgeon: Grace Isaac, MD;  Location: [facility];  Service: Thoracic;  Laterality: Right;   Family History  Problem Relation Age of Onset   Hypertension Mother    Alcohol abuse Mother    Arthritis Mother    COPD Mother    Depression Mother    Heart disease Mother    Hyperlipidemia Mother    Mental illness Mother    Hypertension Father    Alcohol abuse Father    Hearing loss Father    Heart attack Father    Heart disease Father    Hyperlipidemia Father    Stroke Father    Kidney disease Father    Arthritis Father    Depression Brother    Mental illness Brother    Hepatitis C Brother    Hepatitis C Brother    Alcohol abuse Sister     Depression Sister    Mental illness Sister    Lymphoma Maternal Aunt 77   Leukemia Maternal Grandmother        dx in her late 53s   Alcohol abuse Maternal Grandmother    Hearing loss Maternal Grandmother    Heart disease Maternal Grandfather    Heart disease Paternal Grandmother    Birth defects Daughter    Social History   Social History Narrative   Marital status/children/pets: Divorced.   Education/employment: 2 years of college.  Works as a Surveyor, mining.       Allergies as of 09/15/2020       Reactions   Hydrocodone Other (See Comments)   Makes her hyper        Medication List        Accurate as of September 15, 2020 12:06 PM. If you have any questions, ask your nurse or doctor.          albuterol (2.5 MG/3ML) 0.083% nebulizer solution Commonly known as: PROVENTIL Take 3 mLs (2.5 mg total) by nebulization every 6 (six) hours as needed for wheezing or shortness of breath.   Ventolin HFA 108 (90 Base) MCG/ACT inhaler Generic drug: albuterol INHALE 2 PUFFS BY MOUTH EVERY 6 HOURS AS NEEDED FOR WHEEZING AND FOR SHORTNESS OF BREATH   atorvastatin 40 MG tablet Commonly known as: LIPITOR Take 1 tablet (40 mg total) by mouth daily.   budesonide-formoterol 160-4.5 MCG/ACT inhaler Commonly known as: SYMBICORT Inhale 2 puffs into the lungs 2 (two) times daily.   cholecalciferol 25 MCG (1000 UNIT) tablet Commonly known as: VITAMIN D3 Take 1,000 Units by mouth daily.   escitalopram 20 MG tablet Commonly known as: Lexapro Take 1 tablet (20 mg total) by mouth daily.   gabapentin 800 MG tablet Commonly known as: NEURONTIN Take 1 tablet (800 mg total) by mouth daily.   hydrOXYzine 50 MG tablet Commonly known as: ATARAX/VISTARIL Take 1.5-2 tablets (75-100 mg total) by mouth at bedtime.   lisinopril 20 MG tablet Commonly known as: ZESTRIL Take 1  tablet (20 mg total) by mouth daily.   omeprazole 40 MG capsule Commonly known as: PRILOSEC Take 1  capsule (40 mg total) by mouth daily.   zolpidem 12.5 MG CR tablet Commonly known as: Ambien CR Take 1 tablet (12.5 mg total) by mouth at bedtime as needed for sleep.        All past medical history, surgical history, allergies, family history, immunizations andmedications were updated in the EMR today and reviewed under the history and medication portions of their EMR.    No results found for this or any previous visit (from the past 2160 hour(s)).     ROS: 14 pt review of systems performed and negative (unless mentioned in an HPI)  Objective: BP 132/74   Pulse 90   Temp 98.7 F (37.1 C) (Oral)   Ht 5' (1.524 m)   Wt 153 lb (69.4 kg)   SpO2 98%   BMI 29.88 kg/m  Gen: Afebrile. No acute distress.  Pleasant, overweight female HENT: AT. .  Eyes:Pupils Equal Round Reactive to light, Extraocular movements intact,  Conjunctiva without redness, discharge or icterus. Neck/lymp/endocrine: Supple, no lymphadenopathy, no  thyromegaly CV: RRR no murmur, no edema Chest: CTAB, no wheeze or crackles Skin: No rashes, purpura or petechiae.  Neuro:  Normal gait. PERLA. EOMi. Alert x3 Psych: Normal affect, dress and demeanor. Normal speech. Normal thought content and judgment.   Assessment/plan: Gabrielle [name] is a 65 y.o. female present for Primary insomnia/anxiety  Patient seems to have a high tolerance for sedative medications.  She has a past history of substance abuse, not currently an issue for her. Continue Vistaril to 75-100 mg nightly Continue Ambien to 12.5 mg Continue Lexapro 20 mg daily Follow-up 5.5 months   Gastroesophageal reflux disease without esophagitis Stable Continue omeprazole Monitor vitamin D, B12 and mag every 2-3 years    Essential hypertension/hyperlipidemia/morbid obesity Stable Continue lisinopril 20 mg daily Continue atorvastatin Routine exercise and heart healthy diet Follow-up 5.5 months   Osteopenia after menopause Continue vitamin  D supplementation. DEXA completed 2018 (-2.0).  Repeat in 2023   Hyponatremia Patient reports this is chronic for her.  She does endorse drinking a beer on occasions.  It was also felt to be related to her lung cancer in the past with [facility]. Encouraged patient to attempt to drink a V8 juice or Gatorade daily to help replete sodium levels.  Avoid alcohol and beer  Invasive ductal carcinoma of right breast (HCC)/neuropathy Patient is stable, gabapentin prescribed for neuropathy associated with her breast cancer. -Continue gabapentin (NEURONTIN) 800 MG tablet; Take 1 tablet (800 mg total) by mouth daily.  Dispense: 30 tablet; Refill: 5      Orders Placed This Encounter  Procedures   Flu Vaccine QUAD 6+ mos PF IM (Fluarix Quad PF)    Meds ordered this encounter  Medications   atorvastatin (LIPITOR) 40 MG tablet    Sig: Take 1 tablet (40 mg total) by mouth daily.    Dispense:  30 tablet    Refill:  5   escitalopram (LEXAPRO) 20 MG tablet    Sig: Take 1 tablet (20 mg total) by mouth daily.    Dispense:  30 tablet    Refill:  5   gabapentin (NEURONTIN) 800 MG tablet    Sig: Take 1 tablet (800 mg total) by mouth daily.    Dispense:  30 tablet    Refill:  5   hydrOXYzine (ATARAX/VISTARIL) 50 MG tablet    Sig: Take  1.5-2 tablets (75-100 mg total) by mouth at bedtime.    Dispense:  60 tablet    Refill:  5   lisinopril (ZESTRIL) 20 MG tablet    Sig: Take 1 tablet (20 mg total) by mouth daily.    Dispense:  30 tablet    Refill:  5   omeprazole (PRILOSEC) 40 MG capsule    Sig: Take 1 capsule (40 mg total) by mouth daily.    Dispense:  30 capsule    Refill:  5   zolpidem (AMBIEN CR) 12.5 MG CR tablet    Sig: Take 1 tablet (12.5 mg total) by mouth at bedtime as needed for sleep.    Dispense:  30 tablet    Refill:  5    Referral Orders  No referral(s) requested today     Note is dictated utilizing voice recognition software. Although note has been proof read prior to signing,  occasional typographical errors still can be missed. If any questions arise, please do not hesitate to call for verification.  Electronically signed by: Howard Pouch, DO [facility]

## 2020-09-15 NOTE — Patient Instructions (Signed)
Great to see you today.  I have refilled the medication(s) we provide.   If labs were collected, we will inform you of lab results once received either by echart message or telephone call.   - echart message- for normal results that have been seen by the patient already.   - telephone call: abnormal results or if patient has not viewed results in their echart.  

## 2020-09-15 NOTE — Telephone Encounter (Signed)
Patient requesting to change from Ambien back to trazodone. Please call patient to advise.

## 2020-09-15 NOTE — Telephone Encounter (Signed)
Please advise if this is ok.  

## 2020-09-15 NOTE — Telephone Encounter (Signed)
Spoke with pt regarding medications switch. Pt stated that she want to switch because she do not want to have any judgment towards her using a controlled substance. Pt stated that she tried the 300 mg of trazodone and she slept just fine. Pt was informed that any and everything that was done within her visit was just standard protocol across the state of [place]. Pt asked she can bring in the remaining of her pills so we could properly dispose of them so she can prove a point to her doctor. Pt was informed that she cannot bring the pills here but if she want to take them somewhere she can take them to her pharmacy.   Pt was informed that her new medication has been sent and old medication will be cancelled.   Spoke with Jenny Reichmann at [facility] and cancelled all pending scripts for Ambien.

## 2020-09-26 ENCOUNTER — Telehealth: Payer: Self-pay | Admitting: *Deleted

## 2020-09-26 MED ORDER — ALBUTEROL SULFATE HFA 108 (90 BASE) MCG/ACT IN AERS: 1.0000 | INHALATION_SPRAY | Freq: Four times a day (QID) | RESPIRATORY_TRACT | 6 refills | Status: DC | PRN

## 2020-09-26 NOTE — Telephone Encounter (Signed)
Received fax from [facility] stating that the Ventolin HFA is not covered by her insurance and would need a PA or we could change to Albuterol HFA or albuterol sulfate.    The albuterol HFA has been sent to the pharmacy to replace the ventolin due to her insurance.  Nothing further is needed.

## 2020-11-05 ENCOUNTER — Ambulatory Visit: Payer: Self-pay | Admitting: Family Medicine

## 2020-12-10 ENCOUNTER — Other Ambulatory Visit: Payer: Self-pay

## 2020-12-10 ENCOUNTER — Ambulatory Visit (HOSPITAL_COMMUNITY)
Admission: RE | Admit: 2020-12-10 | Discharge: 2020-12-10 | Disposition: A | Discharge status: Home / Self Care | Payer: PRIVATE HEALTH INSURANCE | Source: Ambulatory Visit | Attending: Hematology | Admitting: Hematology

## 2020-12-10 ENCOUNTER — Other Ambulatory Visit (HOSPITAL_COMMUNITY): Payer: 59

## 2020-12-10 ENCOUNTER — Inpatient Hospital Stay (HOSPITAL_COMMUNITY): Payer: PRIVATE HEALTH INSURANCE | Attending: Hematology

## 2020-12-10 DIAGNOSIS — Z780 Asymptomatic menopausal state: Secondary | ICD-10-CM | POA: Diagnosis present

## 2020-12-10 DIAGNOSIS — M8580 Other specified disorders of bone density and structure, unspecified site: Secondary | ICD-10-CM | POA: Diagnosis present

## 2020-12-10 DIAGNOSIS — C50911 Malignant neoplasm of unspecified site of right female breast: Secondary | ICD-10-CM | POA: Diagnosis present

## 2020-12-10 DIAGNOSIS — C3411 Malignant neoplasm of upper lobe, right bronchus or lung: Secondary | ICD-10-CM

## 2020-12-10 LAB — CBC WITH DIFFERENTIAL/PLATELET
Abs Immature Granulocytes: 0.04 10*3/uL (ref 0.00–0.07)
Basophils Absolute: 0.1 10*3/uL (ref 0.0–0.1)
Basophils Relative: 1 %
Eosinophils Absolute: 0.2 10*3/uL (ref 0.0–0.5)
Eosinophils Relative: 2 %
HCT: 37.7 % (ref 36.0–46.0)
Hemoglobin: 13.3 g/dL (ref 12.0–15.0)
Immature Granulocytes: 0 %
Lymphocytes Relative: 30 %
Lymphs Abs: 3 10*3/uL (ref 0.7–4.0)
MCH: 32.8 pg (ref 26.0–34.0)
MCHC: 35.3 g/dL (ref 30.0–36.0)
MCV: 92.9 fL (ref 80.0–100.0)
Monocytes Absolute: 0.6 10*3/uL (ref 0.1–1.0)
Monocytes Relative: 6 %
Neutro Abs: 5.9 10*3/uL (ref 1.7–7.7)
Neutrophils Relative %: 61 %
Platelets: 311 10*3/uL (ref 150–400)
RBC: 4.06 MIL/uL (ref 3.87–5.11)
RDW: 13.9 % (ref 11.5–15.5)
WBC: 9.9 10*3/uL (ref 4.0–10.5)
nRBC: 0 % (ref 0.0–0.2)

## 2020-12-10 LAB — COMPREHENSIVE METABOLIC PANEL
ALT: 31 U/L (ref 0–44)
AST: 28 U/L (ref 15–41)
Albumin: 4.2 g/dL (ref 3.5–5.0)
Alkaline Phosphatase: 80 U/L (ref 38–126)
Anion gap: 12 (ref 5–15)
BUN: 14 mg/dL (ref 8–23)
CO2: 25 mmol/L (ref 22–32)
Calcium: 9.7 mg/dL (ref 8.9–10.3)
Chloride: 98 mmol/L (ref 98–111)
Creatinine, Ser: 0.78 mg/dL (ref 0.44–1.00)
GFR, Estimated: >60 mL/min (ref >60)
Glucose, Bld: 104 mg/dL — ABNORMAL HIGH (ref 70–99)
Potassium: 4.5 mmol/L (ref 3.5–5.1)
Sodium: 135 mmol/L (ref 135–145)
Total Bilirubin: 0.7 mg/dL (ref 0.3–1.2)
Total Protein: 6.9 g/dL (ref 6.5–8.1)

## 2020-12-10 LAB — VITAMIN D 25 HYDROXY (VIT D DEFICIENCY, FRACTURES): Vit D, 25-Hydroxy: 41.36 ng/mL (ref 30–100)

## 2020-12-13 ENCOUNTER — Ambulatory Visit (HOSPITAL_COMMUNITY)
Admission: RE | Admit: 2020-12-13 | Discharge: 2020-12-13 | Disposition: A | Discharge status: Home / Self Care | Payer: PRIVATE HEALTH INSURANCE | Source: Ambulatory Visit | Attending: Hematology | Admitting: Hematology

## 2020-12-13 ENCOUNTER — Ambulatory Visit (HOSPITAL_COMMUNITY): Payer: 59 | Admitting: Hematology

## 2020-12-13 ENCOUNTER — Other Ambulatory Visit: Payer: Self-pay

## 2020-12-13 DIAGNOSIS — Z780 Asymptomatic menopausal state: Secondary | ICD-10-CM

## 2020-12-13 DIAGNOSIS — C3411 Malignant neoplasm of upper lobe, right bronchus or lung: Secondary | ICD-10-CM | POA: Insufficient documentation

## 2020-12-13 DIAGNOSIS — M8580 Other specified disorders of bone density and structure, unspecified site: Secondary | ICD-10-CM | POA: Diagnosis present

## 2020-12-13 DIAGNOSIS — C50911 Malignant neoplasm of unspecified site of right female breast: Secondary | ICD-10-CM | POA: Diagnosis not present

## 2020-12-23 ENCOUNTER — Telehealth (HOSPITAL_BASED_OUTPATIENT_CLINIC_OR_DEPARTMENT_OTHER): Payer: PRIVATE HEALTH INSURANCE | Admitting: Hematology

## 2020-12-23 DIAGNOSIS — Z780 Asymptomatic menopausal state: Secondary | ICD-10-CM | POA: Diagnosis not present

## 2020-12-23 DIAGNOSIS — C3411 Malignant neoplasm of upper lobe, right bronchus or lung: Secondary | ICD-10-CM | POA: Diagnosis not present

## 2020-12-23 DIAGNOSIS — M8580 Other specified disorders of bone density and structure, unspecified site: Secondary | ICD-10-CM

## 2020-12-23 DIAGNOSIS — C50911 Malignant neoplasm of unspecified site of right female breast: Secondary | ICD-10-CM

## 2020-12-23 NOTE — Progress Notes (Signed)
Virtual Visit via Video Note  I connected with Gabrielle [name] on 12/23/20 at  4:00 PM EST by a video enabled telemedicine application and verified that I am speaking with the correct person using two identifiers.  Location: Patient: At home Provider: In the office   I discussed the limitations of evaluation and management by telemedicine and the availability of in person appointments. The patient expressed understanding and agreed to proceed.  History of Present Illness: She is seen in our office for a follow-up of right breast cancer and right lung cancer.  She had right breast lumpectomy and SLNB in April 2014.  She has completed taking anastrozole in June 2019.   Observations/Objective: She denies any new onset pains.  Denies any fevers, night sweats or weight loss.  She quit taking vitamin D about 6 months ago.  Assessment and Plan:  1.  Stage I right breast cancer: - We reviewed mammogram from 12/13/2020 which was BI-RADS Category 1.  2.  Osteopenia: - We reviewed DEXA scan from 12/13/2020 which shows T score -2.1. - Vitamin D level was normal. - Recommend taking calcium plus vitamin D 1 tablet daily.  3.  Right upper lobe lung cancer: - Right upper lobectomy in March 2016. - Does not report any chest pains or change in cough. - Reviewed CT scan of the chest from 12/12/2020 which showed stable postoperative findings with no evidence of recurrence or metastatic disease.   Follow Up Instructions: RTC 1 year for follow-up.   I discussed the assessment and treatment plan with the patient. The patient was provided an opportunity to ask questions and all were answered. The patient agreed with the plan and demonstrated an understanding of the instructions.   The patient was advised to call back or seek an in-person evaluation if the symptoms worsen or if the condition fails to improve as anticipated.  I provided 20 minutes of non-face-to-face time during this  encounter.   Derek Jack, MD

## 2020-12-24 ENCOUNTER — Other Ambulatory Visit (HOSPITAL_COMMUNITY): Payer: Self-pay

## 2020-12-24 DIAGNOSIS — C3411 Malignant neoplasm of upper lobe, right bronchus or lung: Secondary | ICD-10-CM

## 2020-12-24 DIAGNOSIS — C50911 Malignant neoplasm of unspecified site of right female breast: Secondary | ICD-10-CM

## 2020-12-24 NOTE — Progress Notes (Signed)
Orders placed per Dr. Tomie China message -Please order CT chest without contrast (follow-up of right lung cancer), bilateral screening mammogram, CBC, vitamin D, CMP in 1 year.  Thanks.

## 2020-12-28 ENCOUNTER — Ambulatory Visit (HOSPITAL_COMMUNITY): Payer: Self-pay | Admitting: Hematology

## 2021-03-03 ENCOUNTER — Ambulatory Visit: Payer: Self-pay | Admitting: Family Medicine

## 2021-03-04 ENCOUNTER — Ambulatory Visit: Payer: Self-pay | Admitting: Family Medicine

## 2021-03-17 ENCOUNTER — Telehealth: Payer: Self-pay | Admitting: Pulmonary Disease

## 2021-03-17 MED ORDER — PREDNISONE 10 MG PO TABS: ORAL_TABLET | ORAL | 0 refills | Status: DC

## 2021-03-17 NOTE — Telephone Encounter (Signed)
Spoke to patient.  She stated that she developed an asthma flare one week ago.  C/o prod cough with clear to white sputum, occ wheezing and increased sob. Using albuterol HFA BIDand Symbicort BID. Denied f/c/s or additional sx. Preferred pharmacy is [facility] in [place], [place].  Dr. Halford Chessman, please advise.

## 2021-03-17 NOTE — Telephone Encounter (Signed)
Please send script for prednisone 10 mg pill >> 3 pills daily for 2 days, 2 pills daily for 2 days, 1 pill daily for 2 days.  She should call to schedule an appointment if not better by next week.

## 2021-03-17 NOTE — Telephone Encounter (Signed)
Patient is aware of recommendations and voiced her understanding.  Prednisone sent to to preferred pharmacy.  Nothing further needed.

## 2021-04-25 ENCOUNTER — Telehealth: Payer: Self-pay | Admitting: Family Medicine

## 2021-04-25 DIAGNOSIS — C50911 Malignant neoplasm of unspecified site of right female breast: Secondary | ICD-10-CM

## 2021-04-25 MED ORDER — TRAZODONE HCL 300 MG PO TABS: 300.0000 mg | ORAL_TABLET | Freq: Every day | ORAL | 0 refills | Status: DC

## 2021-04-25 NOTE — Telephone Encounter (Signed)
Med refill: ?trazodone (DESYREL) 300 MG tablet [[id number]] ? ? ?[place], [place] Phone:  [phone]  ?Fax:  [phone]  ?  ? ? ?Pt cell: [phone] ?

## 2021-04-25 NOTE — Telephone Encounter (Signed)
Rx sent. Pt need f/u for further refills.  ?

## 2021-05-06 ENCOUNTER — Ambulatory Visit (INDEPENDENT_AMBULATORY_CARE_PROVIDER_SITE_OTHER): Payer: PRIVATE HEALTH INSURANCE | Admitting: Family Medicine

## 2021-05-06 ENCOUNTER — Encounter: Payer: Self-pay | Admitting: Family Medicine

## 2021-05-06 VITALS — BP 102/65 | HR 95 | Temp 98.2°F | Ht 60.0 in | Wt 158.0 lb

## 2021-05-06 DIAGNOSIS — M8580 Other specified disorders of bone density and structure, unspecified site: Secondary | ICD-10-CM

## 2021-05-06 DIAGNOSIS — Z902 Acquired absence of lung [part of]: Secondary | ICD-10-CM

## 2021-05-06 DIAGNOSIS — K219 Gastro-esophageal reflux disease without esophagitis: Secondary | ICD-10-CM | POA: Diagnosis not present

## 2021-05-06 DIAGNOSIS — C3411 Malignant neoplasm of upper lobe, right bronchus or lung: Secondary | ICD-10-CM

## 2021-05-06 DIAGNOSIS — E871 Hypo-osmolality and hyponatremia: Secondary | ICD-10-CM

## 2021-05-06 DIAGNOSIS — I10 Essential (primary) hypertension: Secondary | ICD-10-CM | POA: Diagnosis not present

## 2021-05-06 DIAGNOSIS — C50911 Malignant neoplasm of unspecified site of right female breast: Secondary | ICD-10-CM

## 2021-05-06 DIAGNOSIS — G629 Polyneuropathy, unspecified: Secondary | ICD-10-CM

## 2021-05-06 DIAGNOSIS — E785 Hyperlipidemia, unspecified: Secondary | ICD-10-CM | POA: Diagnosis not present

## 2021-05-06 DIAGNOSIS — F5101 Primary insomnia: Secondary | ICD-10-CM

## 2021-05-06 DIAGNOSIS — Z780 Asymptomatic menopausal state: Secondary | ICD-10-CM

## 2021-05-06 DIAGNOSIS — J4520 Mild intermittent asthma, uncomplicated: Secondary | ICD-10-CM

## 2021-05-06 DIAGNOSIS — F419 Anxiety disorder, unspecified: Secondary | ICD-10-CM

## 2021-05-06 MED ORDER — ALBUTEROL SULFATE HFA 108 (90 BASE) MCG/ACT IN AERS
1.0000 | INHALATION_SPRAY | Freq: Four times a day (QID) | RESPIRATORY_TRACT | 6 refills | Status: DC | PRN
Start: 2021-05-06 — End: 2023-01-12

## 2021-05-06 MED ORDER — HYDROXYZINE HCL 50 MG PO TABS: 75.0000 mg | ORAL_TABLET | Freq: Every day | ORAL | 1 refills | Status: DC

## 2021-05-06 MED ORDER — GABAPENTIN 800 MG PO TABS: 800.0000 mg | ORAL_TABLET | Freq: Every day | ORAL | 1 refills | Status: AC

## 2021-05-06 MED ORDER — ESCITALOPRAM OXALATE 20 MG PO TABS: 20.0000 mg | ORAL_TABLET | Freq: Every day | ORAL | 1 refills | Status: AC

## 2021-05-06 MED ORDER — OMEPRAZOLE 40 MG PO CPDR: 40.0000 mg | DELAYED_RELEASE_CAPSULE | Freq: Every day | ORAL | 1 refills | Status: DC

## 2021-05-06 MED ORDER — ATORVASTATIN CALCIUM 40 MG PO TABS: 40.0000 mg | ORAL_TABLET | Freq: Every day | ORAL | 3 refills | Status: AC

## 2021-05-06 MED ORDER — LISINOPRIL 20 MG PO TABS: 20.0000 mg | ORAL_TABLET | Freq: Every day | ORAL | 1 refills | Status: AC

## 2021-05-06 MED ORDER — TRAZODONE HCL 300 MG PO TABS: 300.0000 mg | ORAL_TABLET | Freq: Every day | ORAL | 1 refills | Status: DC

## 2021-05-06 MED ORDER — BUDESONIDE-FORMOTEROL FUMARATE 160-4.5 MCG/ACT IN AERO: 2.0000 | INHALATION_SPRAY | Freq: Two times a day (BID) | RESPIRATORY_TRACT | 6 refills | Status: DC

## 2021-05-06 NOTE — Progress Notes (Signed)
? ? ? ?Patient ID: Gabrielle [name], female  DOB: February 26, 1955, 66 y.o.   MRN: [id number] ?Patient Care Team  ?  Relationship Specialty Notifications Start End  ?Ma Hillock, DO PCP - General Family Medicine  03/26/20   ?Everardo All, MD Consulting Physician Oncology  04/06/14   ?Chesley Mires, MD Consulting Physician Pulmonary Disease  03/14/19   ?Willia Craze, NP Nurse Practitioner Gastroenterology  03/14/19   ?Barbee Cough, MD Referring Physician Oncology  03/14/19   ?Brien Mates, RN Oncology Nurse Navigator Oncology  11/18/19   ? ? ?Chief Complaint  ?Patient presents with  ? Hypertension  ?  [facility]; pt is not fasting  ? ? ?Subjective: ?Gabrielle [name] is a 66 y.o.  female present for  ?Primary hypertension/Hyperlipidemia LDL goal <70 ?Pt reports compliance with with lisinopril 20 mg daily and atorvastatin. Blood pressures ranges at home normal. Patient denies chest pain, shortness of breath, dizziness or lower extremity edema.  ? Pt does not daily baby ASA. Pt is  prescribed statin. ?RF: Hypertension, hyperlipidemia, former smoker, BMI greater than 30, family history ?  ?Gastroesophageal reflux disease without esophagitis ?Patient reports compliance blood with Prilosec 40 mg daily.  This helps control her symptoms. ?  ?Primary insomnia/anxiety ?She reports compliance with trazodone 300 mg nightly, hydroxyzine 75-100 nightly sleeping well.  Patient reports compliance with Lexapro 20 mg daily ?Prior note: ?Patient reports she has had difficulty sleeping for many years and has been tried on many different medications.  She was taking trazodone 300 mg nightly, but needed this medication change due to formulary change.  She has been tried on [employer] which did not work well.  She states the Ambien 10 mg helps but she still wakes up multiple times throughout the night.  She also is prescribed Vistaril 50 mg nightly.  She denies oversedation. ?Prior note: ?Patient reports she has had difficulty  sleeping for many years and has been tried on many different medications.  She was taking trazodone 300 mg nightly, but needed this medication change due to formulary change.  She has been tried on [employer] which did not work well.  She states the Ambien 10 mg helps but she still wakes up multiple times throughout the night.  She also is prescribed Vistaril 50 mg nightly.  She denies oversedation. ? ? ?  05/06/2021  ?  3:59 PM 09/15/2020  ? 10:18 AM 04/20/2020  ?  3:08 PM 03/26/2020  ?  4:24 PM  ?Depression screen PHQ 2/9  ?Decreased Interest 0 0 0 0  ?Down, Depressed, Hopeless 0 0 0 0  ?PHQ - 2 Score 0 0 0 0  ?Altered sleeping  1 3   ?Tired, decreased energy  0 0   ?Change in appetite  1 0   ?Feeling bad or failure about yourself   0 0   ?Trouble concentrating  1 1   ?Moving slowly or fidgety/restless  0 0   ?Suicidal thoughts  0 0   ?PHQ-9 Score  3 4   ? ? ?  09/15/2020  ? 10:19 AM 04/20/2020  ?  3:08 PM  ?GAD 7 : Generalized Anxiety Score  ?Nervous, Anxious, on Edge 1 3  ?Control/stop worrying 0 3  ?Worry too much - different things 0 3  ?Trouble relaxing 0 1  ?Restless 0 0  ?Easily annoyed or irritable 1 1  ?Afraid - awful might happen 1 3  ?Total GAD 7 Score 3 14  ? ?  ?  ? ?  03/14/2019  ? 11:00 AM  ?Fall Risk   ?Falls in the past year? 0  ?Number falls in past yr: 0  ?Injury with Fall? 0  ?Follow up Falls evaluation completed  ? ?Immunization History  ?Administered Date(s) Administered  ? Influenza Inj Mdck Quad Pf 10/25/2018  ? Influenza Split 09/23/2013  ? Influenza,inj,Quad PF,6+ Mos 11/05/2014, 12/31/2015, 11/05/2019, 09/15/2020  ? Influenza-Unspecified 10/22/2016, 10/25/2018  ? PFIZER(Purple Top)SARS-COV-2 Vaccination 05/02/2019, 06/03/2019, 01/19/2020  ? Pneumococcal Polysaccharide-23 05/24/2015  ? Tdap 04/11/2008  ? ? ?No results found. ? ?Past Medical History:  ?Diagnosis Date  ? Anemia   ? Arthritis   ? Asthma   ? Carpal tunnel syndrome   ? bil  ? Carpal tunnel syndrome   ? Chronic pain   ? Chronic pain in  left foot   ? Cough   ? Depression   ? espisodic  ? Drug abuse ([place])   ? Dyspnea   ? with exertion an have failed over night oximenrty - wears oxygen at hs  ? GERD (gastroesophageal reflux disease)   ? H/O eating disorder   ? history of Substance abuse ([place])   ? Substance abuse in the past.    ? Hyperlipidemia   ? Hypertension   ? Invasive ductal carcinoma of right breast ([place]) 08/21/2012  ? Right  ? Legally blind   ? Lung cancer ([place]) 2016  ? Orthostatic hypotension   ? Pneumonia   ? 03-13-17  ? S/P bilateral breast implants 08/21/2012  ? Prior to cancer diagnosis   ? Sleep apnea   ? Tobacco abuse 08/21/2012  ? > 40 pack years  ? ?Allergies  ?Allergen Reactions  ? Hydrocodone Other (See Comments)  ?  Makes her hyper  ? ?Past Surgical History:  ?Procedure Laterality Date  ? BREAST LUMPECTOMY Left 1990  ? [facility] / right was cancer  ? BREAST LUMPECTOMY Right 2014  ? BREAST LUMPECTOMY WITH SENTINEL LYMPH NODE BIOPSY Right 04/2012  ? ESOPHAGOGASTRODUODENOSCOPY (EGD) WITH PROPOFOL N/A 05/10/2017  ? Procedure: ESOPHAGOGASTRODUODENOSCOPY (EGD) WITH PROPOFOL;  Surgeon: Milus Banister, MD;  [facility];  Service: Endoscopy;  Laterality: N/A;  ? LOBECTOMY Right 04/20/2014  ? Procedure: LOBECTOMY, RIGHT UPPER LOBE WITH PLACEMENT OF ONQ PAIN PUMP;  Surgeon: Grace Isaac, MD;  Location: [facility];  Service: Thoracic;  Laterality: Right;  ? NODE DISSECTION Right 04/20/2014  ? Procedure: NODE DISSECTION;  Surgeon: Grace Isaac, MD;  Location: [facility];  Service: Thoracic;  Laterality: Right;  ? [place]  ? PLANTAR FASCIA RELEASE Left 1990s  ? SHOULDER ARTHROSCOPY WITH SUBACROMIAL DECOMPRESSION, ROTATOR CUFF REPAIR AND BICEP TENDON REPAIR Right 06/27/2017  ? Procedure: RIGHT SHOULDER ARTHROSCOPY WITH DEBRIDEMENT, SUBACROMIAL DECOMPRESSION AND ROTATOR CUFF REPAIR, BICEP TENODESIS;  Surgeon: Hiram Gash, MD;  Location: [facility];  Service: Orthopedics;  Laterality: Right;  ? [place]  ? VAGINAL HYSTERECTOMY  1990  ? VIDEO ASSISTED THORACOSCOPY (VATS)/WEDGE RESECTION Right 04/20/2014  ? Procedure: VIDEO ASSISTED THORACOSCOPY ;  Surgeon: Grace Isaac, MD;  Location: [facility];  Service: Thoracic;  Laterality: Right;  ? VIDEO BRONCHOSCOPY N/A 04/20/2014  ? Procedure: VIDEO BRONCHOSCOPY;  Surgeon: Grace Isaac, MD;  Location[facility];  Service: Thoracic;  Laterality: N/A;  ? WEDGE RESECTION Right 04/20/2014  ? Procedure: WEDGE RESECTION, RIGHT UPPER LOBE;  Surgeon: Grace Isaac, MD;  Location: [place] [facility];  Service: Thoracic;  Laterality: Right;  ? ?Family History  ?  Problem Relation Age of Onset  ? Hypertension Mother   ? Alcohol abuse Mother   ? Arthritis Mother   ? COPD Mother   ? Depression Mother   ? Heart disease Mother   ? Hyperlipidemia Mother   ? Mental illness Mother   ? Hypertension Father   ? Alcohol abuse Father   ? Hearing loss Father   ? Heart attack Father   ? Heart disease Father   ? Hyperlipidemia Father   ? Stroke Father   ? Kidney disease Father   ? Arthritis Father   ? Depression Brother   ? Mental illness Brother   ? Hepatitis C Brother   ? Hepatitis C Brother   ? Alcohol abuse Sister   ? Depression Sister   ? Mental illness Sister   ? Lymphoma Maternal Aunt 39  ? Leukemia Maternal Grandmother   ?     dx in her late 32s  ? Alcohol abuse Maternal Grandmother   ? Hearing loss Maternal Grandmother   ? Heart disease Maternal Grandfather   ? Heart disease Paternal Grandmother   ? Birth defects Daughter   ? ?Social History  ? ?Social History Narrative  ? Marital status/children/pets: Divorced.  ? Education/employment: 2 years of college.  Works as a Surveyor, mining.  ?   ? ? ?Allergies as of 05/06/2021   ? ?   Reactions  ? Hydrocodone Other (See Comments)  ? Makes her hyper  ? ?  ? ?  ?Medication List  ?  ? ?  ? Accurate as of May 06, 2021  5:25 PM. If you have any questions, ask your nurse or doctor.  ?  ?  ? ?  ? ?STOP taking these medications    ? ?cholecalciferol 25 MCG (1000 UNIT) tablet ?Commonly known as: VITAMIN D3 ?Stopped by: Howard Pouch, DO ?  ?predniSONE 10 MG tablet ?Commonly known as: DELTASONE ?Stopped by: Howard Pouch, DO ?  ? ?  ? ?TAKE these medicat

## 2021-05-06 NOTE — Patient Instructions (Addendum)
? ? ?  Great to see you today.  ?I have refilled the medication(s) we provide.  ? ?If labs were collected, we will inform you of lab results once received either by echart message or telephone call.  ? - echart message- for normal results that have been seen by the patient already.  ? - telephone call: abnormal results or if patient has not viewed results in their echart. ? ? ?Return in about 24 weeks (around 10/21/2021) for Routine chronic condition follow-up. ? ?LABS NEXT VISIT ? ? ?

## 2021-07-21 ENCOUNTER — Telehealth: Payer: Self-pay | Admitting: Pulmonary Disease

## 2021-07-21 NOTE — Telephone Encounter (Signed)
Called and spoke with patient who states that Monday she started having symptoms of shortness of breath and wheezing and got worse yesterday. Productive cough with light yellow sputum. States that her chest is really tight and that she can't get a lot up. She states she had a fever Monday of 100.4 and has been taking Tylenol round the clock since then. States that she normally wears 2 liters oxygen at night. Oxygen levels are in the 82% on room air with exertion/activity. When she sits down to rest she states it goes back up to around 87%. Patient is currently at 91%. Patient is currently in [place] at her house there and states she does not have oxygen at that house. Pharmacy is [facility].    Dr. Halford Chessman please advise

## 2021-07-21 NOTE — Telephone Encounter (Signed)
Called and spoke with patient and told her to go to the ED or urgent care for further eval. She expressed understanding. Nothing further needed at this time.

## 2021-07-21 NOTE — Telephone Encounter (Signed)
She needs to go to an emergency room or urgent care center near her to have this assessed further.

## 2021-07-22 ENCOUNTER — Telehealth: Payer: Self-pay | Admitting: Pulmonary Disease

## 2021-07-22 NOTE — Telephone Encounter (Signed)
Left message for patient to call back  

## 2021-07-25 NOTE — Telephone Encounter (Signed)
Attempted to call pt but line went directly to VM. Unable to leave VM as mailbox was full. Due to multiple attempts trying to reach pt and unable to do so, per protocol encounter will be closed.

## 2021-07-27 ENCOUNTER — Telehealth: Payer: Self-pay | Admitting: Pulmonary Disease

## 2021-07-27 DIAGNOSIS — J441 Chronic obstructive pulmonary disease with (acute) exacerbation: Secondary | ICD-10-CM

## 2021-07-27 DIAGNOSIS — J4541 Moderate persistent asthma with (acute) exacerbation: Secondary | ICD-10-CM

## 2021-07-27 DIAGNOSIS — J9611 Chronic respiratory failure with hypoxia: Secondary | ICD-10-CM

## 2021-07-27 NOTE — Telephone Encounter (Signed)
Called and spoke with patient who is requesting a referral to [facility]. Patient is in [place]. [facility] phone number is [phone]. Fax number is [phone]. Order has been placed. Advised her they would call once they got the referral. Will route to Dr. Halford Chessman As [facility]. Nothing further needed at this time.

## 2021-07-27 NOTE — Telephone Encounter (Signed)
Pt calling again in regards to closed encounter form 6/30. Patient would like referral to [facility]. Patient is in [place]. [place] phone number is [phone]. Fax number is [phone]. Advised pt that her VM is full and we will need to speak with her prior to sending referral. Advised to answer if a [phone] number calls. PT verbalized understanding.

## 2021-09-11 ENCOUNTER — Other Ambulatory Visit: Payer: Self-pay | Admitting: Family Medicine

## 2021-09-12 ENCOUNTER — Encounter: Payer: Self-pay | Admitting: Family Medicine

## 2021-09-12 ENCOUNTER — Ambulatory Visit (INDEPENDENT_AMBULATORY_CARE_PROVIDER_SITE_OTHER): Payer: PRIVATE HEALTH INSURANCE | Admitting: Family Medicine

## 2021-09-12 VITALS — BP 126/67 | HR 74 | Temp 98.1°F | Ht 59.75 in | Wt 150.0 lb

## 2021-09-12 DIAGNOSIS — R600 Localized edema: Secondary | ICD-10-CM

## 2021-09-12 DIAGNOSIS — E871 Hypo-osmolality and hyponatremia: Secondary | ICD-10-CM | POA: Diagnosis not present

## 2021-09-12 LAB — BASIC METABOLIC PANEL
BUN: 5 mg/dL — ABNORMAL LOW (ref 6–23)
CO2: 27 mEq/L (ref 19–32)
Calcium: 9 mg/dL (ref 8.4–10.5)
Chloride: 91 mEq/L — ABNORMAL LOW (ref 96–112)
Creatinine, Ser: 0.55 mg/dL (ref 0.40–1.20)
GFR: 97.25 mL/min (ref >60.00)
Glucose, Bld: 96 mg/dL (ref 70–99)
Potassium: 3.5 mEq/L (ref 3.5–5.1)
Sodium: 129 mEq/L — ABNORMAL LOW (ref 135–145)

## 2021-09-12 NOTE — Patient Instructions (Signed)
We will call you with labs once resulted.

## 2021-09-12 NOTE — Progress Notes (Signed)
Gabrielle [name] , 1955/10/27, 66 y.o., female MRN: [id number] Patient Care Team    Relationship Specialty Notifications Start End  Ma Hillock, DO PCP - General Family Medicine  03/26/20   Everardo All, MD Consulting Physician Oncology  04/06/14   Chesley Mires, MD Consulting Physician Pulmonary Disease  03/14/19   Willia Craze, NP Nurse Practitioner Gastroenterology  03/14/19   Barbee Cough, MD Referring Physician Oncology  03/14/19   Brien Mates, RN Oncology Nurse Navigator Oncology  11/18/19     Chief Complaint  Patient presents with   Leg Swelling    Pt c/o leg swelling x 2 mos;      Subjective: Pt presents for an OV with complaints of leg edema of 2 months duration. Tomatoes with salt as cause during tomato season.  She reports edema very mild today after backing off on the above. She wanted to make sure her sodium levels were normal and BP ok.       05/06/2021    3:59 PM 09/15/2020   10:18 AM 04/20/2020    3:08 PM 03/26/2020    4:24 PM  Depression screen PHQ 2/9  Decreased Interest 0 0 0 0  Down, Depressed, Hopeless 0 0 0 0  PHQ - 2 Score 0 0 0 0  Altered sleeping  1 3   Tired, decreased energy  0 0   Change in appetite  1 0   Feeling bad or failure about yourself   0 0   Trouble concentrating  1 1   Moving slowly or fidgety/restless  0 0   Suicidal thoughts  0 0   PHQ-9 Score  3 4     Allergies  Allergen Reactions   Hydrocodone Other (See Comments)    Makes her hyper   Social History   Social History Narrative   Marital status/children/pets: Divorced.   Education/employment: 2 years of college.  Works as a Surveyor, mining.      Past Medical History:  Diagnosis Date   Anemia    Arthritis    Asthma    Carpal tunnel syndrome    bil   Carpal tunnel syndrome    Chronic pain    Chronic pain in left foot    Cough    Depression    espisodic   Drug abuse ([place])    Dyspnea    with exertion an have failed over night  oximenrty - wears oxygen at hs   GERD (gastroesophageal reflux disease)    H/O eating disorder    history of Substance abuse ([place])    Substance abuse in the past.     Hyperlipidemia    Hypertension    Invasive ductal carcinoma of right breast ([place]) 08/21/2012   Right   Legally blind    Lung cancer ([place]) 2016   Orthostatic hypotension    Pneumonia    03-13-17   S/P bilateral breast implants 08/21/2012   Prior to cancer diagnosis    Sleep apnea    Tobacco abuse 08/21/2012   > 40 pack years   Past Surgical History:  Procedure Laterality Date   BREAST LUMPECTOMY Left 1990   [facility] / right was cancer   BREAST LUMPECTOMY Right 2014   BREAST LUMPECTOMY WITH SENTINEL LYMPH NODE BIOPSY Right 04/2012   ESOPHAGOGASTRODUODENOSCOPY (EGD) WITH PROPOFOL N/A 05/10/2017   Procedure: ESOPHAGOGASTRODUODENOSCOPY (EGD) WITH PROPOFOL;  Surgeon: Milus Banister, MD;  [facility];  Service: Endoscopy;  Laterality: N/A;  LOBECTOMY Right 04/20/2014   Procedure: LOBECTOMY, RIGHT UPPER LOBE WITH PLACEMENT OF ONQ PAIN PUMP;  Surgeon: Grace Isaac, MD;  Location: [facility];  Service: Thoracic;  Laterality: Right;   NODE DISSECTION Right 04/20/2014   Procedure: NODE DISSECTION;  Surgeon: Grace Isaac, MD;  [facility];  Service: Thoracic;  Laterality: Right;   PLACEMENT OF BREAST IMPLANTS  1993   PLANTAR FASCIA RELEASE Left 1990s   SHOULDER ARTHROSCOPY WITH SUBACROMIAL DECOMPRESSION, ROTATOR CUFF REPAIR AND BICEP TENDON REPAIR Right 06/27/2017   Procedure: RIGHT SHOULDER ARTHROSCOPY WITH DEBRIDEMENT, SUBACROMIAL DECOMPRESSION AND ROTATOR CUFF REPAIR, BICEP TENODESIS;  Surgeon: Hiram Gash, MD;  Location: [facility];  Service: Orthopedics;  Laterality: Right;   TONSILLECTOMY AND ADENOIDECTOMY  1965   VAGINAL HYSTERECTOMY  1990   VIDEO ASSISTED THORACOSCOPY (VATS)/WEDGE RESECTION Right 04/20/2014   Procedure: VIDEO ASSISTED THORACOSCOPY ;  Surgeon: Grace Isaac, MD;  Location: [facility];  Service:  Thoracic;  Laterality: Right;   VIDEO BRONCHOSCOPY N/A 04/20/2014   Procedure: VIDEO BRONCHOSCOPY;  Surgeon: Grace Isaac, MD;  Location: [place] [facility];  Service: Thoracic;  Laterality: N/A;   WEDGE RESECTION Right 04/20/2014   Procedure: WEDGE RESECTION, RIGHT UPPER LOBE;  Surgeon: Grace Isaac, MD;  Location: [facility];  Service: Thoracic;  Laterality: Right;   Family History  Problem Relation Age of Onset   Hypertension Mother    Alcohol abuse Mother    Arthritis Mother    COPD Mother    Depression Mother    Heart disease Mother    Hyperlipidemia Mother    Mental illness Mother    Hypertension Father    Alcohol abuse Father    Hearing loss Father    Heart attack Father    Heart disease Father    Hyperlipidemia Father    Stroke Father    Kidney disease Father    Arthritis Father    Depression Brother    Mental illness Brother    Hepatitis C Brother    Hepatitis C Brother    Alcohol abuse Sister    Depression Sister    Mental illness Sister    Lymphoma Maternal Aunt 4   Leukemia Maternal Grandmother        dx in her late 25s   Alcohol abuse Maternal Grandmother    Hearing loss Maternal Grandmother    Heart disease Maternal Grandfather    Heart disease Paternal Grandmother    Birth defects Daughter    Allergies as of 09/12/2021       Reactions   Hydrocodone Other (See Comments)   Makes her hyper        Medication List        Accurate as of September 12, 2021  9:01 AM. If you have any questions, ask your nurse or doctor.          albuterol 108 (90 Base) MCG/ACT inhaler Commonly known as: VENTOLIN HFA Inhale 1-2 puffs into the lungs every 6 (six) hours as needed for wheezing or shortness of breath.   atorvastatin 40 MG tablet Commonly known as: LIPITOR Take 1 tablet (40 mg total) by mouth daily.   budesonide-formoterol 160-4.5 MCG/ACT inhaler Commonly known as: SYMBICORT Inhale 2 puffs into the lungs 2 (two) times daily.   escitalopram 20 MG  tablet Commonly known as: Lexapro Take 1 tablet (20 mg total) by mouth daily.   gabapentin 800 MG tablet Commonly known as: NEURONTIN Take 1 tablet (800 mg total) by mouth daily.  hydrOXYzine 50 MG tablet Commonly known as: ATARAX Take 1.5-2 tablets (75-100 mg total) by mouth at bedtime.   lisinopril 20 MG tablet Commonly known as: ZESTRIL Take 1 tablet (20 mg total) by mouth daily.   omeprazole 40 MG capsule Commonly known as: PRILOSEC Take 1 capsule (40 mg total) by mouth daily.   trazodone 300 MG tablet Commonly known as: DESYREL Take 1 tablet (300 mg total) by mouth at bedtime.        All past medical history, surgical history, allergies, family history, immunizations andmedications were updated in the EMR today and reviewed under the history and medication portions of their EMR.     ROS Negative, with the exception of above mentioned in HPI   Objective:  BP 126/67   Pulse 74   Temp 98.1 F (36.7 C) (Oral)   Ht 4' 11.75" (1.518 m)   Wt 150 lb (68 kg)   SpO2 96%   BMI 29.54 kg/m  Body mass index is 29.54 kg/m. Physical Exam Vitals and nursing note reviewed.  Constitutional:      General: She is not in acute distress.    Appearance: Normal appearance. She is not ill-appearing, toxic-appearing or diaphoretic.  HENT:     Head: Normocephalic and atraumatic.  Eyes:     General: No scleral icterus.       Right eye: No discharge.        Left eye: No discharge.     Extraocular Movements: Extraocular movements intact.     Conjunctiva/sclera: Conjunctivae normal.     Pupils: Pupils are equal, round, and reactive to light.  Cardiovascular:     Rate and Rhythm: Normal rate and regular rhythm.     Heart sounds: No murmur heard. Pulmonary:     Effort: Pulmonary effort is normal. No respiratory distress.     Breath sounds: Normal breath sounds. No wheezing, rhonchi or rales.  Musculoskeletal:     Cervical back: Neck supple. No tenderness.     Right lower leg:  Edema (trace) present.     Left lower leg: Edema (trace) present.  Lymphadenopathy:     Cervical: No cervical adenopathy.  Skin:    General: Skin is warm and dry.     Coloration: Skin is not jaundiced or pale.     Findings: No erythema or rash.  Neurological:     Mental Status: She is alert and oriented to person, place, and time. Mental status is at baseline.     Motor: No weakness.     Gait: Gait normal.  Psychiatric:        Mood and Affect: Mood normal.        Behavior: Behavior normal.        Thought Content: Thought content normal.        Judgment: Judgment normal.      No results found. No results found. No results found for this or any previous visit (from the past 24 hour(s)).  Assessment/Plan: Gabrielle [name] is a 66 y.o. female present for OV for Hyponatremia Has been better at drinking electrolyte replacements and water - Basic Metabolic Panel (BMET)  Lower extremity edema Agree. Likely caused by her diet change through tomato season.  Not concerns today, trace only bilateral  Reviewed expectations re: course of current medical issues. Discussed self-management of symptoms. Outlined signs and symptoms indicating need for more acute intervention. Patient verbalized understanding and all questions were answered. Patient received an After-Visit Summary.    Orders Placed  This Encounter  Procedures   Basic Metabolic Panel (BMET)   No orders of the defined types were placed in this encounter.  Referral Orders  No referral(s) requested today     Note is dictated utilizing voice recognition software. Although note has been proof read prior to signing, occasional typographical errors still can be missed. If any questions arise, please do not hesitate to call for verification.   electronically signed by:  Howard Pouch, DO  [place]

## 2021-09-13 ENCOUNTER — Telehealth: Payer: Self-pay | Admitting: Family Medicine

## 2021-09-13 MED ORDER — POTASSIUM CHLORIDE CRYS ER 20 MEQ PO TBCR: 20.0000 meq | EXTENDED_RELEASE_TABLET | Freq: Two times a day (BID) | ORAL | 0 refills | Status: DC

## 2021-09-13 MED ORDER — SODIUM CHLORIDE 1 G PO TABS: 1.0000 g | ORAL_TABLET | Freq: Once | ORAL | 1 refills | Status: AC

## 2021-09-13 NOTE — Telephone Encounter (Signed)
Please inform patient her sodium and chloride are low.  Her potassium is lower than her normal.  I have called in a sodium chloride tablet for her to start once daily, indefinitely.  Continue to hydrate with Gatorade 0/G2 products for the electrolyte balance. She will need to monitor any lower extremity edema on the salt tablet.  This is a lower dose in hopes to just help enough to keep her sodium/chloride in normal without causing the leg edema.  I also called in 5 days of potassium supplement.  She is to take 1 tab twice daily for the next 5 days to bump her potassium back up to her normal.

## 2021-09-13 NOTE — Telephone Encounter (Signed)
LM for pt to return call regarding results.  

## 2021-09-14 NOTE — Telephone Encounter (Signed)
Spoke with pt regarding labs and instructions.   

## 2021-12-14 ENCOUNTER — Ambulatory Visit: Payer: PRIVATE HEALTH INSURANCE | Admitting: Family Medicine

## 2021-12-19 ENCOUNTER — Other Ambulatory Visit: Payer: PRIVATE HEALTH INSURANCE

## 2021-12-19 ENCOUNTER — Ambulatory Visit (HOSPITAL_COMMUNITY): Payer: PRIVATE HEALTH INSURANCE

## 2021-12-26 ENCOUNTER — Ambulatory Visit: Payer: PRIVATE HEALTH INSURANCE | Admitting: Hematology

## 2022-05-12 ENCOUNTER — Other Ambulatory Visit: Payer: Self-pay | Admitting: Family Medicine

## 2022-05-12 DIAGNOSIS — J4520 Mild intermittent asthma, uncomplicated: Secondary | ICD-10-CM

## 2022-06-14 ENCOUNTER — Other Ambulatory Visit: Payer: Self-pay | Admitting: Family Medicine

## 2022-06-14 DIAGNOSIS — J4520 Mild intermittent asthma, uncomplicated: Secondary | ICD-10-CM

## 2022-10-13 ENCOUNTER — Telehealth: Payer: Self-pay | Admitting: Primary Care

## 2022-10-13 NOTE — Telephone Encounter (Signed)
Pt calling in to get a refill for Symbicort  [facility] [address], [place] - [address]

## 2022-10-16 NOTE — Telephone Encounter (Signed)
Patient not seen in office since 2022.   Spoke with patient and advised since we have not seen her in the office in several years she would need to contact her PCP for refills of her medications. Patient also asked about alternative, as her ins no longer covers Symbicort. Advised patient to contact ins co for covered formulary alternative. She voiced understanding. Reviewed upcoming appt information, she is aware. Nothing further needed at this time.

## 2022-10-16 NOTE — Telephone Encounter (Signed)
Patient checking on refill for medication. Patient out of medication, Patient phone number is [phone].

## 2022-11-08 DIAGNOSIS — F339 Major depressive disorder, recurrent, unspecified: Secondary | ICD-10-CM | POA: Diagnosis not present

## 2022-11-08 DIAGNOSIS — Z1339 Encounter for screening examination for other mental health and behavioral disorders: Secondary | ICD-10-CM | POA: Diagnosis not present

## 2022-11-08 DIAGNOSIS — Z0000 Encounter for general adult medical examination without abnormal findings: Secondary | ICD-10-CM | POA: Diagnosis not present

## 2022-11-08 DIAGNOSIS — Z6830 Body mass index (BMI) 30.0-30.9, adult: Secondary | ICD-10-CM | POA: Diagnosis not present

## 2022-11-08 DIAGNOSIS — Z299 Encounter for prophylactic measures, unspecified: Secondary | ICD-10-CM | POA: Diagnosis not present

## 2022-11-08 DIAGNOSIS — E785 Hyperlipidemia, unspecified: Secondary | ICD-10-CM | POA: Diagnosis not present

## 2022-11-08 DIAGNOSIS — Z1331 Encounter for screening for depression: Secondary | ICD-10-CM | POA: Diagnosis not present

## 2022-11-08 DIAGNOSIS — Z23 Encounter for immunization: Secondary | ICD-10-CM | POA: Diagnosis not present

## 2022-11-08 DIAGNOSIS — I10 Essential (primary) hypertension: Secondary | ICD-10-CM | POA: Diagnosis not present

## 2022-11-08 DIAGNOSIS — F419 Anxiety disorder, unspecified: Secondary | ICD-10-CM | POA: Diagnosis not present

## 2022-11-08 DIAGNOSIS — Z7189 Other specified counseling: Secondary | ICD-10-CM | POA: Diagnosis not present

## 2022-11-20 DIAGNOSIS — F419 Anxiety disorder, unspecified: Secondary | ICD-10-CM | POA: Diagnosis not present

## 2022-11-20 DIAGNOSIS — I70213 Atherosclerosis of native arteries of extremities with intermittent claudication, bilateral legs: Secondary | ICD-10-CM | POA: Diagnosis not present

## 2022-11-20 DIAGNOSIS — E785 Hyperlipidemia, unspecified: Secondary | ICD-10-CM | POA: Diagnosis not present

## 2022-11-20 DIAGNOSIS — I70211 Atherosclerosis of native arteries of extremities with intermittent claudication, right leg: Secondary | ICD-10-CM | POA: Diagnosis not present

## 2022-11-27 DIAGNOSIS — Z1212 Encounter for screening for malignant neoplasm of rectum: Secondary | ICD-10-CM | POA: Diagnosis not present

## 2022-11-27 DIAGNOSIS — Z1211 Encounter for screening for malignant neoplasm of colon: Secondary | ICD-10-CM | POA: Diagnosis not present

## 2022-11-28 ENCOUNTER — Encounter: Payer: Self-pay | Admitting: Primary Care

## 2022-11-28 ENCOUNTER — Ambulatory Visit: Payer: PPO | Admitting: Primary Care

## 2022-11-28 VITALS — BP 140/80 | HR 89 | Ht 61.0 in | Wt 158.4 lb

## 2022-11-28 DIAGNOSIS — C3411 Malignant neoplasm of upper lobe, right bronchus or lung: Secondary | ICD-10-CM

## 2022-11-28 DIAGNOSIS — J9611 Chronic respiratory failure with hypoxia: Secondary | ICD-10-CM | POA: Diagnosis not present

## 2022-11-28 DIAGNOSIS — J4540 Moderate persistent asthma, uncomplicated: Secondary | ICD-10-CM | POA: Diagnosis not present

## 2022-11-28 MED ORDER — VORTEX VALVED HOLDING CHAMBER DEVI: 1.0000 | Freq: Every day | Status: AC

## 2022-11-28 MED ORDER — FLUTICASONE FUROATE-VILANTEROL 200-25 MCG/ACT IN AEPB: 1.0000 | INHALATION_SPRAY | Freq: Every day | RESPIRATORY_TRACT | 5 refills | Status: DC

## 2022-11-28 NOTE — Patient Instructions (Addendum)
Recommendation:  - Continue Breo 200 mcg 1 puff daily (rinse mouth after use) - Use albuterol as needed every 6 hours for shortness of breath/wheezing   Orders: - CT chest w/o contrast re: monitoring non-small cell lung cancer status post lobectomy (order)  - DME order for replacement home concentrator (uses 2L oxygen at night) - Order for aerochamber   Follow-up: - 6 months with Waynetta Sandy NP

## 2022-11-28 NOTE — Progress Notes (Signed)
@Patient  ID: Gabrielle [name], female    DOB: 1955-03-19, 67 y.o.   MRN: [id number]  Chief Complaint  Patient presents with   Follow-up    Referring provider: No ref. provider found  HPI: 67 year old female, former smoker quit in 2016 (40-pack-year history).  Past medical history significant for hypertension, asthma, emphysema, nocturnal hypoxemia, non small cell lung cancer s/p lobectomy, breast cancer, GERD, osteopenia, hyperlipidemia, morbid obesity.  Patient of Dr. Craige Cotta, office on 03/19/2020.    11/28/2022 Discussed the use of AI scribe software for clinical note transcription with the patient, who gave verbal consent to proceed.  History of Present Illness   Patient presents today for an overdue follow-up.  She was last seen in March 2022 for asthma exacerbation treated with Augmentin and prednisone. CXR showed hyperinflation.  She is followed by our office for history of moderate persistent asthma and chronic respiratory failure.   She reports her oxygen concentrator broke approximately three months ago, and she has been without supplemental oxygen since. Despite this, she denies experiencing nocturnal dyspnea or other respiratory distress. She was previously on 2 liters of oxygen at night.  The patient also reports she has been off her Symbicort due to insurance issues and has been using Vraylar instead, which she feels is less effective. However, she denies any significant shortness of breath or need for a rescue inhaler. BREO is covered by her insurance.  Additionally, the patient has a history of lung cancer with a right upper lobectomy in 2016. She expresses concern that it has been approximately three years since her last follow-up CT scan. She denies any symptoms suggestive of recurrence, such as hemoptysis or unexplained weight loss.  Allergies  Allergen Reactions   Hydrocodone Other (See Comments)    Makes her hyper    Immunization History  Administered Date(s)  Administered   Influenza Inj Mdck Quad Pf 10/25/2018   Influenza Split 09/23/2013   Influenza,inj,Quad PF,6+ Mos 11/05/2014, 12/31/2015, 11/05/2019, 09/15/2020   Influenza-Unspecified 10/22/2016, 10/25/2018   PFIZER(Purple Top)SARS-COV-2 Vaccination 05/02/2019, 06/03/2019, 01/19/2020   Pneumococcal Polysaccharide-23 05/24/2015   Tdap 04/11/2008    Past Medical History:  Diagnosis Date   Anemia    Arthritis    Asthma    Carpal tunnel syndrome    bil   Carpal tunnel syndrome    Chronic pain    Chronic pain in left foot    Cough    Depression    espisodic   Drug abuse (HCC)    Dyspnea    with exertion an have failed over night oximenrty - wears oxygen at hs   GERD (gastroesophageal reflux disease)    H/O eating disorder    history of Substance abuse (HCC)    Substance abuse in the past.     Hyperlipidemia    Hypertension    Invasive ductal carcinoma of right breast (HCC) 08/21/2012   Right   Legally blind    Lung cancer (HCC) 2016   Orthostatic hypotension    Pneumonia    03-13-17   S/P bilateral breast implants 08/21/2012   Prior to cancer diagnosis    Sleep apnea    Tobacco abuse 08/21/2012   > 40 pack years    Tobacco History: Social History   Tobacco Use  Smoking Status Former   Current packs/day: 0.00   Average packs/day: 1 pack/day for 43.2 years (43.2 ttl pk-yrs)   Types: Cigarettes   Start date: 79   Quit date: 04/20/2014   Years since  quitting: 8.6  Smokeless Tobacco Never   Counseling given: Not Answered   Outpatient Medications Prior to Visit  Medication Sig Dispense Refill   albuterol (VENTOLIN HFA) 108 (90 Base) MCG/ACT inhaler Inhale 1-2 puffs into the lungs every 6 (six) hours as needed for wheezing or shortness of breath. 8 g 6   atorvastatin (LIPITOR) 40 MG tablet Take 1 tablet (40 mg total) by mouth daily. 90 tablet 3   escitalopram (LEXAPRO) 20 MG tablet Take 1 tablet (20 mg total) by mouth daily. 90 tablet 1   gabapentin (NEURONTIN)  800 MG tablet Take 1 tablet (800 mg total) by mouth daily. 90 tablet 1   lisinopril (ZESTRIL) 20 MG tablet Take 1 tablet (20 mg total) by mouth daily. 90 tablet 1   omeprazole (PRILOSEC) 40 MG capsule Take 1 capsule (40 mg total) by mouth daily. 90 capsule 1   trazodone (DESYREL) 300 MG tablet Take 1 tablet (300 mg total) by mouth at bedtime. 90 tablet 1   budesonide-formoterol (SYMBICORT) 160-4.5 MCG/ACT inhaler Inhale 2 puffs into the lungs 2 (two) times daily. 10.2 g 6   hydrOXYzine (ATARAX) 50 MG tablet Take 1.5-2 tablets (75-100 mg total) by mouth at bedtime. 180 tablet 1   potassium chloride SA (KLOR-CON M) 20 MEQ tablet Take 1 tablet (20 mEq total) by mouth 2 (two) times daily. 10 tablet 0   BREO ELLIPTA 200-25 MCG/ACT AEPB 1 puff daily. (Patient not taking: Reported on 11/28/2022)     Choline Fenofibrate (FENOFIBRIC ACID) 135 MG CPDR Take 1 capsule by mouth daily. (Patient not taking: Reported on 11/28/2022)     temazepam (RESTORIL) 15 MG capsule Take 15 mg by mouth at bedtime as needed. (Patient not taking: Reported on 11/28/2022)     No facility-administered medications prior to visit.    Review of Systems  Review of Systems  Constitutional: Negative.   HENT: Negative.    Respiratory: Negative.    Cardiovascular: Negative.    Physical Exam  There were no vitals taken for this visit. Physical Exam Constitutional:      Appearance: Normal appearance.  HENT:     Head: Normocephalic and atraumatic.  Cardiovascular:     Rate and Rhythm: Normal rate and regular rhythm.  Pulmonary:     Effort: Pulmonary effort is normal.     Breath sounds: Normal breath sounds.  Musculoskeletal:        General: Normal range of motion.  Skin:    General: Skin is warm and dry.  Neurological:     General: No focal deficit present.     Mental Status: She is alert and oriented to person, place, and time. Mental status is at baseline.  Psychiatric:        Mood and Affect: Mood normal.         Behavior: Behavior normal.        Thought Content: Thought content normal.        Judgment: Judgment normal.      Lab Results:  CBC    Component Value Date/Time   WBC 9.9 12/10/2020 0908   RBC 4.06 12/10/2020 0908   HGB 13.3 12/10/2020 0908   HCT 37.7 12/10/2020 0908   PLT 311 12/10/2020 0908   MCV 92.9 12/10/2020 0908   MCH 32.8 12/10/2020 0908   MCHC 35.3 12/10/2020 0908   RDW 13.9 12/10/2020 0908   LYMPHSABS 3.0 12/10/2020 0908   MONOABS 0.6 12/10/2020 0908   EOSABS 0.2 12/10/2020 0908   BASOSABS 0.1  12/10/2020 0908    BMET    Component Value Date/Time   NA 129 (L) 09/12/2021 0836   K 3.5 09/12/2021 0836   CL 91 (L) 09/12/2021 0836   CO2 27 09/12/2021 0836   GLUCOSE 96 09/12/2021 0836   BUN 5 (L) 09/12/2021 0836   CREATININE 0.55 09/12/2021 0836   CREATININE 0.73 03/26/2020 1520   CALCIUM 9.0 09/12/2021 0836   GFRNONAA >60 12/10/2020 0908   GFRAA >60 06/26/2017 1043    BNP    Component Value Date/Time   BNP 194.6 (H) 04/23/2014 1223    ProBNP No results found for: "PROBNP"  Imaging: No results found.   Assessment & Plan:      1. Malignant neoplasm of upper lobe of right lung (HCC) - CT Chest Wo Contrast; Future - AMB REFERRAL FOR DME  2. Moderate persistent asthma without complication  3. Chronic respiratory failure with hypoxia (HCC)   Moderate Persistent Asthma Patient ran out of Symbicort and insurance will not cover it. Patient reports Vraylar did not work as well. No recent exacerbations or need for rescue inhaler. -Transition to [employer] daily, which is covered by insurance.  Chronic Respiratory Failure Patient typically wears 2L oxygen at night. She has been without home oxygen concentrator for three months due to machine malfunction.  -Order new home oxygen concentrator.  History of Lung Cancer Patient had right upper lobectomy in 2016. Last CT scan was two years ago with no evidence of recurrence or metastatic  disease. -Order CT scan of the chest for follow-up.  General Health Maintenance -Declined RSV vaccine. -Advised to inquire about shingles vaccine at pharmacy. -Schedule follow-up appointment in six months.    Glenford Bayley, NP 11/28/2022

## 2022-11-29 DIAGNOSIS — L03039 Cellulitis of unspecified toe: Secondary | ICD-10-CM | POA: Diagnosis not present

## 2022-11-29 DIAGNOSIS — Z299 Encounter for prophylactic measures, unspecified: Secondary | ICD-10-CM | POA: Diagnosis not present

## 2022-11-29 DIAGNOSIS — M25552 Pain in left hip: Secondary | ICD-10-CM | POA: Diagnosis not present

## 2022-11-29 DIAGNOSIS — K219 Gastro-esophageal reflux disease without esophagitis: Secondary | ICD-10-CM | POA: Diagnosis not present

## 2022-11-29 DIAGNOSIS — E785 Hyperlipidemia, unspecified: Secondary | ICD-10-CM | POA: Diagnosis not present

## 2022-11-29 DIAGNOSIS — I10 Essential (primary) hypertension: Secondary | ICD-10-CM | POA: Diagnosis not present

## 2022-11-29 NOTE — Addendum Note (Signed)
Addended by: Glenford Bayley on: 11/29/2022 04:50 PM   Modules accepted: Orders

## 2022-12-04 LAB — EXTERNAL GENERIC LAB PROCEDURE: COLOGUARD: NEGATIVE

## 2022-12-04 LAB — COLOGUARD: COLOGUARD: NEGATIVE

## 2022-12-05 DIAGNOSIS — E871 Hypo-osmolality and hyponatremia: Secondary | ICD-10-CM | POA: Diagnosis not present

## 2022-12-05 DIAGNOSIS — I10 Essential (primary) hypertension: Secondary | ICD-10-CM | POA: Diagnosis not present

## 2022-12-05 DIAGNOSIS — R112 Nausea with vomiting, unspecified: Secondary | ICD-10-CM | POA: Diagnosis not present

## 2022-12-05 DIAGNOSIS — Z87891 Personal history of nicotine dependence: Secondary | ICD-10-CM | POA: Diagnosis not present

## 2022-12-05 DIAGNOSIS — J439 Emphysema, unspecified: Secondary | ICD-10-CM | POA: Diagnosis not present

## 2022-12-05 DIAGNOSIS — Z9882 Breast implant status: Secondary | ICD-10-CM | POA: Diagnosis not present

## 2022-12-05 DIAGNOSIS — I498 Other specified cardiac arrhythmias: Secondary | ICD-10-CM | POA: Diagnosis not present

## 2022-12-05 DIAGNOSIS — R110 Nausea: Secondary | ICD-10-CM | POA: Diagnosis not present

## 2022-12-12 ENCOUNTER — Other Ambulatory Visit: Payer: PRIVATE HEALTH INSURANCE

## 2022-12-28 DIAGNOSIS — Z1231 Encounter for screening mammogram for malignant neoplasm of breast: Secondary | ICD-10-CM | POA: Diagnosis not present

## 2023-01-01 ENCOUNTER — Telehealth: Payer: Self-pay | Admitting: Primary Care

## 2023-01-01 DIAGNOSIS — J4540 Moderate persistent asthma, uncomplicated: Secondary | ICD-10-CM

## 2023-01-01 MED ORDER — MOLNUPIRAVIR 200 MG PO CAPS: 4.0000 | ORAL_CAPSULE | Freq: Two times a day (BID) | ORAL | 0 refills | Status: AC

## 2023-01-01 MED ORDER — PREDNISONE 10 MG PO TABS: ORAL_TABLET | ORAL | 0 refills | Status: DC

## 2023-01-01 NOTE — Telephone Encounter (Signed)
Called and spoke with the pt  She states that she started feeling bad on last Wed 12/27/22  She took a covid test at the pharmacy yesterday 12/31/22- came back POS  She is having low grade fever, cough- yellow sputum, increased SOB, wheezing and sats 85% ra at rest She does not have o2 at home  I mentioned may need to seek emergency care with sats in the 80's  She was upset at the thought of going to ED and worries bc she is all alone  [name], please advise thanks!  Allergies  Allergen Reactions   Hydrocodone Other (See Comments)    Makes her hyper

## 2023-01-01 NOTE — Telephone Encounter (Signed)
I called and spoke with the patient, provided recommendations per Ames Dura NP.  She was not happy about going to the ED.  She said her sats are 88% when she is up moving around and when she sits down they go up to 90-92%.  It looks like she wears oxygen 2L at HS.  She is requesting a flutter valve as well as supplies for her nebulizer machine (tubing and mask).  I have pended the orders.  She asked that I provide you with the information to see if that makes a difference with your recommendations.  I let her know that I would make [name] aware and one of [place] would call her back with any further recommendations.  [name], please advise.  Thank you.

## 2023-01-01 NOTE — Telephone Encounter (Signed)
PT tested pos for Covid last week. States she is SOB,  has asthma exacerbation, nauseous and can not come in. Is there anything we can call in to relive symptoms.  [facility] in [place]  Also, she needs Tubing, mask and [place] valve for her nebulizer called in to her supplier.  Her # is [phone]

## 2023-01-01 NOTE — Telephone Encounter (Signed)
We can send in flutter valve, medication for her nebulizer machine, antiviral called molnupiravir (not able to send paxlovid because I don't have recent kidney function on labs) and prednisone taper 40mg  x 3 days, 30mg  x 3 days, 20mg  x3 days and 10mg  x 2 days. She can wear supplemental oxygen around the house as needed to keep O2 >88-90%. If symptoms worsen over the next 24-48 hours she needs to go to ED. Push fluids, take mucinex 1200mg  twice daily to loosen congestion and take tylenol 650mg  every 4-6 hours as needed for fever/chills.

## 2023-01-01 NOTE — Telephone Encounter (Signed)
Called the pt and made aware of response per [facility]  She verbalized understanding  Nothing further needed

## 2023-01-01 NOTE — Telephone Encounter (Signed)
She should go to ED unfortunately. She is just about out of the window for antiviral. She needs  CXR. Concern she could have pneumonia or very least asthma exacerbation that might require IV steroids, supplemental oxygen and nebulized BD.

## 2023-01-03 ENCOUNTER — Telehealth: Payer: Self-pay | Admitting: Primary Care

## 2023-01-03 DIAGNOSIS — J9611 Chronic respiratory failure with hypoxia: Secondary | ICD-10-CM

## 2023-01-03 DIAGNOSIS — J4541 Moderate persistent asthma with (acute) exacerbation: Secondary | ICD-10-CM

## 2023-01-03 NOTE — Telephone Encounter (Signed)
[facility] can't get patient her oxygen in tie and will like a referral sent in for [facility] [phone]   lbuterol (VENTOLIN HFA) 108 (90 Base) MCG/ACT inhaler  [facility] in [place]

## 2023-01-04 NOTE — Telephone Encounter (Signed)
Since no reply to [facility] message Pt is now calling upset. Needs supplies for CPAP and a flutter valve. [facility] Messages do not go in as High Priority so I have moved it to that.

## 2023-01-05 ENCOUNTER — Other Ambulatory Visit: Payer: PRIVATE HEALTH INSURANCE

## 2023-01-05 MED ORDER — ALBUTEROL SULFATE (2.5 MG/3ML) 0.083% IN NEBU: 2.5000 mg | INHALATION_SOLUTION | Freq: Four times a day (QID) | RESPIRATORY_TRACT | 1 refills | Status: AC | PRN

## 2023-01-05 NOTE — Telephone Encounter (Signed)
Yes, triage please send in order for neb supplies to [facility] as urgent priority

## 2023-01-05 NOTE — Telephone Encounter (Addendum)
I sent in DME order to [facility] as high priority and sent in Albuterol to pharmacy   Triage can you place an order for flutter valve

## 2023-01-06 NOTE — Telephone Encounter (Signed)
Flutter valve order placed.

## 2023-01-09 DIAGNOSIS — G4730 Sleep apnea, unspecified: Secondary | ICD-10-CM | POA: Diagnosis not present

## 2023-01-09 DIAGNOSIS — J4550 Severe persistent asthma, uncomplicated: Secondary | ICD-10-CM | POA: Diagnosis not present

## 2023-01-09 DIAGNOSIS — R0902 Hypoxemia: Secondary | ICD-10-CM | POA: Diagnosis not present

## 2023-01-12 ENCOUNTER — Ambulatory Visit
Admission: RE | Admit: 2023-01-12 | Discharge: 2023-01-12 | Disposition: A | Discharge status: Home / Self Care | Payer: PPO | Source: Ambulatory Visit | Attending: Primary Care | Admitting: Primary Care

## 2023-01-12 DIAGNOSIS — J439 Emphysema, unspecified: Secondary | ICD-10-CM | POA: Diagnosis not present

## 2023-01-12 DIAGNOSIS — Z85110 Personal history of malignant carcinoid tumor of bronchus and lung: Secondary | ICD-10-CM | POA: Diagnosis not present

## 2023-01-12 DIAGNOSIS — C3411 Malignant neoplasm of upper lobe, right bronchus or lung: Secondary | ICD-10-CM

## 2023-01-12 MED ORDER — ALBUTEROL SULFATE HFA 108 (90 BASE) MCG/ACT IN AERS: 1.0000 | INHALATION_SPRAY | Freq: Four times a day (QID) | RESPIRATORY_TRACT | 6 refills | Status: DC | PRN

## 2023-01-12 NOTE — Telephone Encounter (Signed)
Called the pt and there was no answer and her voicemail box was full  Will call back   I went ahead and refilled the albuterol inhaler for her

## 2023-01-15 NOTE — Telephone Encounter (Signed)
Spoke with patient.  Patient states albuterol has been filled and picked up.  Patient would also like the results of her ONO.  She saw results in MyChart over the weekend.    [name], I see where the ONO was scanned into EPIC on 01/12/2023 but no result note from you.  Please advise for patient.  Thank you.

## 2023-01-15 NOTE — Telephone Encounter (Signed)
Gabrielle [name] 01/09/23 showed patient spent 7 hours with oxygen level less than 88%.  SpO2 low 74%, baseline 86%. Advised patient wear 3L oxygen at night.  DME order placed for patient to receive new home oxygen concentrator.

## 2023-01-18 NOTE — Telephone Encounter (Signed)
Lm x1 for pt.

## 2023-01-18 NOTE — Telephone Encounter (Signed)
Called and spoke with pt. Gabrielle [name] went over results with pt already. Pt did need another order for a home concentrator for her 2nd home. I did inform pt she might have to coe out of pocket for this. She verbalized understanding prescription printed and awaiting to be signed by one of the NP   She would like the  prescription mailed nfn

## 2023-01-25 ENCOUNTER — Telehealth: Payer: Self-pay | Admitting: Primary Care

## 2023-01-25 NOTE — Telephone Encounter (Signed)
 Patient is returning phone call. Patient phone number is [phone].

## 2023-01-29 DIAGNOSIS — E781 Pure hyperglyceridemia: Secondary | ICD-10-CM | POA: Diagnosis not present

## 2023-01-29 DIAGNOSIS — Z299 Encounter for prophylactic measures, unspecified: Secondary | ICD-10-CM | POA: Diagnosis not present

## 2023-01-29 DIAGNOSIS — M79675 Pain in left toe(s): Secondary | ICD-10-CM | POA: Diagnosis not present

## 2023-01-29 DIAGNOSIS — I10 Essential (primary) hypertension: Secondary | ICD-10-CM | POA: Diagnosis not present

## 2023-01-29 DIAGNOSIS — J449 Chronic obstructive pulmonary disease, unspecified: Secondary | ICD-10-CM | POA: Diagnosis not present

## 2023-01-29 DIAGNOSIS — I739 Peripheral vascular disease, unspecified: Secondary | ICD-10-CM | POA: Diagnosis not present

## 2023-01-29 NOTE — Telephone Encounter (Signed)
 Patient returning call to [facility]. She inquired about results CT. Informed due to shortage of radiologist report not available yet.

## 2023-01-30 NOTE — Telephone Encounter (Signed)
 Results are now available.   [name], please advise. Thanks

## 2023-01-30 NOTE — Telephone Encounter (Signed)
 CT chest 01/12/23 showed s/p right upper lobectomy without evidence for recurrent or metastatic disease. No pneumonia. Emphysema, minimal fibrosis anterior lungs. Aortic atherosclerosis.

## 2023-01-31 NOTE — Telephone Encounter (Signed)
 Pt is aware of results and voiced her understanding. Nothing further needed.

## 2023-02-05 ENCOUNTER — Ambulatory Visit: Payer: PPO | Admitting: Podiatry

## 2023-02-05 ENCOUNTER — Encounter: Payer: Self-pay | Admitting: Podiatry

## 2023-02-05 DIAGNOSIS — I739 Peripheral vascular disease, unspecified: Secondary | ICD-10-CM

## 2023-02-05 DIAGNOSIS — L600 Ingrowing nail: Secondary | ICD-10-CM | POA: Diagnosis not present

## 2023-02-05 MED ORDER — MUPIROCIN 2 % EX OINT: 1.0000 | TOPICAL_OINTMENT | Freq: Two times a day (BID) | CUTANEOUS | 2 refills | Status: DC

## 2023-02-05 MED ORDER — CEPHALEXIN 500 MG PO CAPS: 500.0000 mg | ORAL_CAPSULE | Freq: Three times a day (TID) | ORAL | 0 refills | Status: DC

## 2023-02-05 NOTE — Progress Notes (Signed)
 Subjective:   Patient ID: Gabrielle [name], female   DOB: 68 y.o.   MRN: [id number]   HPI Chief Complaint  Patient presents with   Nail Problem    RM#13 left big toe nail pain X 2 months no injury .    68 year old female who presents the office with above concerns.  She said that she was recent antibiotics, Bactrim and this caused low sodium and she had to the emergency room.  She has been on another antibiotic, possibly doxycycline  which should help when she was on it.  She is concerned about the rest of her toes turning red if she gets a dusky color to her big toe.  There has been to try to trim the nail border.  She states she did have arterial studies performed about a month ago at [facility] and was told she had mild to moderate atherosclerosis. She has not seen vascular.     Review of Systems  All other systems reviewed and are negative.  Past Medical History:  Diagnosis Date   Anemia    Arthritis    Asthma    Carpal tunnel syndrome    bil   Carpal tunnel syndrome    Chronic pain    Chronic pain in left foot    Cough    Depression    espisodic   Drug abuse (HCC)    Dyspnea    with exertion an have failed over night oximenrty - wears oxygen  at hs   GERD (gastroesophageal reflux disease)    H/O eating disorder    history of Substance abuse (HCC)    Substance abuse in the past.     Hyperlipidemia    Hypertension    Invasive ductal carcinoma of right breast (HCC) 08/21/2012   Right   Legally blind    Lung cancer (HCC) 2016   Orthostatic hypotension    Pneumonia    03-13-17   S/P bilateral breast implants 08/21/2012   Prior to cancer diagnosis    Sleep apnea    Tobacco abuse 08/21/2012   > 40 pack years    Past Surgical History:  Procedure Laterality Date   BREAST LUMPECTOMY Left 1990   [facility] / right was cancer   BREAST LUMPECTOMY Right 2014   BREAST LUMPECTOMY WITH SENTINEL LYMPH NODE BIOPSY Right 04/2012   ESOPHAGOGASTRODUODENOSCOPY (EGD) WITH  PROPOFOL  N/A 05/10/2017   Procedure: ESOPHAGOGASTRODUODENOSCOPY (EGD) WITH PROPOFOL ;  Surgeon: Teressa Toribio SQUIBB, MD;  Location: [facility];  Service: Endoscopy;  Laterality: N/A;   LOBECTOMY Right 04/20/2014   Procedure: LOBECTOMY, RIGHT UPPER LOBE WITH PLACEMENT OF ONQ PAIN PUMP;  Surgeon: Dallas KATHEE Jude, MD;  Location: [facility] OR;  Service: Thoracic;  Laterality: Right;   NODE DISSECTION Right 04/20/2014   Procedure: NODE DISSECTION;  Surgeon: Dallas KATHEE Jude, MD;  Location: [facility] OR;  Service: Thoracic;  Laterality: Right;   PLACEMENT OF BREAST IMPLANTS  1993   PLANTAR FASCIA RELEASE Left 1990s   SHOULDER ARTHROSCOPY WITH SUBACROMIAL DECOMPRESSION, ROTATOR CUFF REPAIR AND BICEP TENDON REPAIR Right 06/27/2017   Procedure: RIGHT SHOULDER ARTHROSCOPY WITH DEBRIDEMENT, SUBACROMIAL DECOMPRESSION AND ROTATOR CUFF REPAIR, BICEP TENODESIS;  Surgeon: Cristy Bonner DASEN, MD;  Location: [facility] OR;  Service: Orthopedics;  Laterality: Right;   TONSILLECTOMY AND ADENOIDECTOMY  1965   VAGINAL HYSTERECTOMY  1990   VIDEO ASSISTED THORACOSCOPY (VATS)/WEDGE RESECTION Right 04/20/2014   Procedure: VIDEO ASSISTED THORACOSCOPY ;  Surgeon: Dallas KATHEE Jude, MD;  Location: [facility] OR;  Service: Thoracic;  Laterality: Right;   VIDEO BRONCHOSCOPY N/A 04/20/2014   Procedure: VIDEO BRONCHOSCOPY;  Surgeon: Dallas KATHEE Jude, MD;  Location: [facility] OR;  Service: Thoracic;  Laterality: N/A;   WEDGE RESECTION Right 04/20/2014   Procedure: WEDGE RESECTION, RIGHT UPPER LOBE;  Surgeon: Dallas KATHEE Jude, MD;  Location: [facility] OR;  Service: Thoracic;  Laterality: Right;     Current Outpatient Medications:    albuterol  (PROVENTIL ) (2.5 MG/3ML) 0.083% nebulizer solution, Take 3 mLs (2.5 mg total) by nebulization every 6 (six) hours as needed for wheezing or shortness of breath., Disp: 150 mL, Rfl: 1   albuterol  (VENTOLIN  HFA) 108 (90 Base) MCG/ACT inhaler, Inhale 1-2 puffs into the lungs every 6 (six) hours as needed for wheezing or shortness of breath.,  Disp: 8 g, Rfl: 6   atorvastatin  (LIPITOR) 40 MG tablet, Take 1 tablet (40 mg total) by mouth daily., Disp: 90 tablet, Rfl: 3   BREO ELLIPTA  200-25 MCG/ACT AEPB, 1 puff daily., Disp: , Rfl:    cephALEXin  (KEFLEX ) 500 MG capsule, Take 1 capsule (500 mg total) by mouth 3 (three) times daily., Disp: 30 capsule, Rfl: 0   Choline Fenofibrate (FENOFIBRIC ACID) 135 MG CPDR, Take 1 capsule by mouth daily., Disp: , Rfl:    escitalopram  (LEXAPRO ) 20 MG tablet, Take 1 tablet (20 mg total) by mouth daily., Disp: 90 tablet, Rfl: 1   fluticasone  furoate-vilanterol (BREO ELLIPTA ) 200-25 MCG/ACT AEPB, Inhale 1 puff into the lungs daily., Disp: 60 each, Rfl: 5   gabapentin  (NEURONTIN ) 800 MG tablet, Take 1 tablet (800 mg total) by mouth daily., Disp: 90 tablet, Rfl: 1   lisinopril  (ZESTRIL ) 20 MG tablet, Take 1 tablet (20 mg total) by mouth daily., Disp: 90 tablet, Rfl: 1   mupirocin  ointment (BACTROBAN ) 2 %, Apply 1 Application topically 2 (two) times daily., Disp: 30 g, Rfl: 2   omeprazole  (PRILOSEC) 40 MG capsule, Take 1 capsule (40 mg total) by mouth daily., Disp: 90 capsule, Rfl: 1   predniSONE  (DELTASONE ) 10 MG tablet, 4 tabs for 3 days, then 3 tabs for 3 days, 2 tabs for 3 days, then 1 tab for 3 days, then stop, Disp: 30 tablet, Rfl: 0   Spacer/Aero-Holding Chambers (VORTEX VALVED HOLDING CHAMBER) DEVI, 1 puff by Does not apply route daily., Disp: , Rfl:    temazepam  (RESTORIL ) 15 MG capsule, Take 15 mg by mouth at bedtime as needed., Disp: , Rfl:    trazodone  (DESYREL ) 300 MG tablet, Take 1 tablet (300 mg total) by mouth at bedtime., Disp: 90 tablet, Rfl: 1  Allergies  Allergen Reactions   Hydrocodone Other (See Comments)    Makes her hyper           Objective:  Physical Exam  General: AAO x3, NAD  Dermatological: Mild incurvation present left hallux toenail with some dried blood present.  There is no purulence identified today.  There is localized edema and erythema to the hallux but there is  a red discoloration to the digits of the left foot.  There is no ascending cellulitis.  No fluctuation or crepitation.  Vascular: DP, PT pulses palpable on the right side but unable to palpate pulse on the left side.  She has some mild discoloration of the toes and concerned about more skin changes.  Neruologic: Grossly intact via light touch bilateral.   Musculoskeletal: Tenderness left hallux toenail.  No other areas of tenderness.     Assessment:   Ingrown toenail left hallux, PAD     Plan:  -  Treatment options discussed including all alternatives, risks, and complications -Etiology of symptoms were discussed -I had extensive conversation with him concerning her circulation.  I referred her to vascular surgery.  As there is no purulence today I will try to hold off on the procedure.  She will be debrided the corner of the nail without any complications or bleeding.  Recommend mupirocin  ointment dressing changes daily.  Prescribed cephalexin .  Should symptoms worsen we would need to proceed with partial nail avulsion however this will create a wound and I am concerned about healing.  -Monitor for any clinical signs or symptoms of infection and directed to call the office immediately should any occur or go to the ER.  Return in about 2 weeks (around 02/19/2023) for ingrown toenail, PAD.  Donnice JONELLE Fees DPM

## 2023-02-05 NOTE — Patient Instructions (Signed)
 Peripheral Vascular Disease  Peripheral vascular disease (PVD) is a disease of the blood vessels. PVD may also be called peripheral artery disease (PAD) or poor circulation. PVD is the blocking or hardening of the arteries anywhere within the circulatory system beyond the heart. This can result in a decreased supply of blood to the arms, legs, and internal organs, such as the stomach or kidneys. However, PVD most often affects a person's lower legs and feet. Without treatment, PVD often worsens. PVD can lead to acute limb ischemia. This occurs when an arm or leg suddenly has trouble getting enough blood. This is a medical emergency. What are the causes? The most common cause of PVD is atherosclerosis. This is a buildup of fatty material and other substances (plaque)inside your arteries. Pieces of plaque can break off from the walls of an artery and become stuck in a smaller artery, blocking blood flow and possibly causing acute limb ischemia. Other common causes of PVD include: Blood clots that form inside the blood vessels. Injuries to blood vessels. Diseases that cause inflammation of blood vessels or cause blood vessel tightening (spasms). What increases the risk? The following factors may make you more likely to develop this condition: A family history of PVD. Common medical conditions, including: High cholesterol. Diabetes. High blood pressure (hypertension). Heart disease. Known atherosclerotic disease in another area of the body. Past injury, such as burns or a broken bone. Other medical conditions, such as: Buerger's disease. This is caused by inflamed blood vessels in your hands and feet. Some forms of arthritis. Birth defects that affect the arteries in your legs. Kidney disease. Using tobacco and nicotine  products. Not getting enough exercise. Obesity. Being age 56 or older, or being age 71 or older and having the other risk factors. What are the signs or symptoms? This  condition may cause different symptoms. Your symptoms depend on what body part is not getting enough blood. Common signs and symptoms include: Cramps in your buttocks, legs, and feet. Intermittent claudication. This is pain and weakness in your legs during activity that resolves with rest. Leg pain at rest and leg numbness, tingling, or weakness. Coldness in a leg or foot, especially when compared to the other leg or foot. Skin or hair changes. These can include: Hair loss. Shiny skin. Pale or bluish skin. Thick toenails. Inability to get or maintain an erection (erectile dysfunction). Tiredness (fatigue). Weak pulse or no pulse in the feet. People with PVD are more likely to develop open wounds (ulcers) and sores on their toes, feet, or legs. The ulcers or sores may take longer than normal to heal. How is this diagnosed? PVD is diagnosed based on your signs and symptoms, a physical exam, and your medical history. You may also have other tests to find the cause. Tests include: Ankle-brachial index test.This test compares the blood pressure readings of the legs and arms. This may also include an exercise ankle-brachial index test in which you walk on a treadmill to check your symptoms. Doppler ultrasound. This takes pictures of blood flow through your blood vessels. Imaging studies that use dye to show blood flow. These are: CT angiogram. Magnetic resonance angiogram, or MRA. How is this treated? Treatment for PVD depends on the cause of your condition, how severe your symptoms are, and your age. Underlying causes need to be treated and controlled. These include long-term (chronic) conditions, such as diabetes, high cholesterol, and hypertension. Treatment may include: Lifestyle changes, such as: Quitting tobacco use. Exercising regularly. Following a  low-fat, low-cholesterol diet. Not drinking alcohol . Taking medicines, such as: Blood thinners to prevent blood clots. Medicines to  improve blood flow. Medicines to improve cholesterol levels. Procedures, such as: Angioplasty. This uses an inflated balloon to open a blocked artery and improve blood flow. Stent implant. This inserts a small mesh tube to keep a blocked artery open. Peripheral bypass surgery. This reroutes blood flow around a blocked artery. Surgery to remove dead tissue from an infected wound (debridement). Amputation. This is surgical removal of the affected limb. It may be necessary in cases of acute limb ischemia when medical or surgical treatments have not helped. Follow these instructions at home: Medicines Take over-the-counter and prescription medicines only as told by your health care provider. If you are taking blood thinners: Talk with your health care provider before you take any medicines that contain aspirin  or NSAIDs, such as ibuprofen. These medicines increase your risk for dangerous bleeding. Take your medicine exactly as told, at the same time every day. Avoid activities that could cause injury or bruising, and follow instructions about how to prevent falls. Wear a medical alert bracelet or carry a card that lists what medicines you take. Lifestyle     Exercise regularly. Ask your health care provider about some good activities for you. Talk with your health care provider about maintaining a healthy weight. If needed, ask about losing weight. Eat a diet that is low in fat and cholesterol. If you need help, talk with your health care provider. Do not drink alcohol . Do not use any products that contain nicotine  or tobacco. These products include cigarettes, chewing tobacco, and vaping devices, such as e-cigarettes. If you need help quitting, ask your health care provider. General instructions Take good care of your feet. To do this: Wear comfortable shoes that fit well. Check your feet often for any cuts or sores. Get an annual influenza vaccine. Keep all follow-up visits. This is  important. Where to find more information Society for Vascular Surgery: vascular.org American Heart Association: heart.org National Heart, Lung, and Blood Institute: buffalodrycleaner.gl Contact a health care provider if: You have leg cramps while walking. You have leg pain when you rest. Your leg or foot feels cold. Your skin changes color. You have erectile dysfunction. You have cuts or sores on your legs or feet that do not heal. Get help right away if: You have sudden changes in color and feeling of your arms or legs, such as: Your arm or leg turns cold, numb, and blue. Your arm or leg becomes red, warm, swollen, painful, or numb. You have any symptoms of a stroke. BE FAST is an easy way to remember the main warning signs of a stroke: B - Balance. Signs are dizziness, sudden trouble walking, or loss of balance. E - Eyes. Signs are trouble seeing or a sudden change in vision. F - Face. Signs are sudden weakness or numbness of the face, or the face or eyelid drooping on one side. A - Arms. Signs are weakness or numbness in an arm. This happens suddenly and usually on one side of the body. S - Speech. Signs are sudden trouble speaking, slurred speech, or trouble understanding what people say. T - Time. Time to call emergency services. Write down what time symptoms started. You have other signs of a stroke, such as: A sudden, severe headache with no known cause. Nausea or vomiting. Seizure. You have chest pain or trouble breathing. These symptoms may represent a serious problem that is an emergency.  Do not wait to see if the symptoms will go away. Get medical help right away. Call your local emergency services (911 in the [place] Do not drive yourself to the hospital. Summary Peripheral vascular disease (PVD) is a disease of the blood vessels. PVD is the blocking or hardening of the arteries anywhere within the circulatory system beyond the heart. PVD may cause different symptoms. Your  symptoms depend on what part of your body is not getting enough blood. Treatment for PVD depends on what caused it, how severe your symptoms are, and your age. This information is not intended to replace advice given to you by your health care provider. Make sure you discuss any questions you have with your health care provider. Document Revised: 06/17/2019 Document Reviewed: 07/14/2019 Elsevier Patient Education  2024 [employer]  Ingrown Toenail  An ingrown toenail occurs when the corner or sides of a toenail grow into the surrounding skin. This causes discomfort and pain. The big toe is most commonly affected, but any of the toes can be affected. If an ingrown toenail is not treated, it can become infected. What are the causes? This condition may be caused by: Wearing shoes that are too small or tight. An injury, such as stubbing your toe or having your toe stepped on. Improper cutting or care of your toenails. Having nail or foot abnormalities that were present from birth (congenital abnormalities), such as having a nail that is too big for your toe. What increases the risk? The following factors may make you more likely to develop ingrown toenails: Age. Nails tend to get thicker with age, so ingrown nails are more common among older people. Cutting your toenails incorrectly, such as cutting them very short or cutting them unevenly. An ingrown toenail is more likely to get infected if you have: Diabetes. Blood flow (circulation) problems. What are the signs or symptoms? Symptoms of an ingrown toenail may include: Pain, soreness, or tenderness. Redness. Swelling. Hardening of the skin that surrounds the toenail. Signs that an ingrown toenail may be infected include: Fluid or pus. Symptoms that get worse. How is this diagnosed? Ingrown toenails may be diagnosed based on: Your symptoms and medical history. A physical exam. Labs or tests. If you have fluid or blood coming from  your toenail, a sample may be collected to test for the specific type of bacteria that is causing the infection. How is this treated? Treatment depends on the severity of your symptoms. You may be able to care for your toenail at home. If you have an infection, you may be prescribed antibiotic medicines. If you have fluid or pus draining from your toenail, your health care provider may drain it. If you have trouble walking, you may be given crutches to use. If you have a severe or infected ingrown toenail, you may need a procedure to remove part or all of the nail. Follow these instructions at home: Foot care  Check your wound every day for signs of infection, or as often as told by your health care provider. Check for: More redness, swelling, or pain. More fluid or blood. Warmth. Pus or a bad smell. Do not pick at your toenail or try to remove it yourself. Soak your foot in warm, soapy water . Do this for 20 minutes, 3 times a day, or as often as told by your health care provider. This helps to keep your toe clean and your skin soft. Wear shoes that fit well and are not too tight. Your  health care provider may recommend that you wear open-toed shoes while you heal. Trim your toenails regularly and carefully. Cut your toenails straight across to prevent injury to the skin at the corners of the toenail. Do not cut your nails in a curved shape. Keep your feet clean and dry to help prevent infection. General instructions Take over-the-counter and prescription medicines only as told by your health care provider. If you were prescribed an antibiotic, take it as told by your health care provider. Do not stop taking the antibiotic even if you start to feel better. If your health care provider told you to use crutches to help you move around, use them as instructed. Return to your normal activities as told by your health care provider. Ask your health care provider what activities are safe for  you. Keep all follow-up visits. This is important. Contact a health care provider if: You have more redness, swelling, pain, or other symptoms that do not improve with treatment. You have fluid, blood, or pus coming from your toenail. You have a red streak on your skin that starts at your foot and spreads up your leg. You have a fever. Summary An ingrown toenail occurs when the corner or sides of a toenail grow into the surrounding skin. This causes discomfort and pain. The big toe is most commonly affected, but any of the toes can be affected. If an ingrown toenail is not treated, it can become infected. Fluid or pus draining from your toenail is a sign of infection. Your health care provider may need to drain it. You may be given antibiotics to treat the infection. Trimming your toenails regularly and properly can help you prevent an ingrown toenail. This information is not intended to replace advice given to you by your health care provider. Make sure you discuss any questions you have with your health care provider. Document Revised: 05/11/2020 Document Reviewed: 05/11/2020 Elsevier Patient Education  2024 [employer].

## 2023-02-15 ENCOUNTER — Other Ambulatory Visit: Payer: Self-pay | Admitting: Podiatry

## 2023-02-15 ENCOUNTER — Telehealth: Payer: Self-pay | Admitting: Podiatry

## 2023-02-15 MED ORDER — AMOXICILLIN-POT CLAVULANATE 875-125 MG PO TABS: 1.0000 | ORAL_TABLET | Freq: Two times a day (BID) | ORAL | 0 refills | Status: DC

## 2023-02-15 NOTE — Telephone Encounter (Signed)
Patient stated she completed antibiotics and experiencing chills, and a temperature of 99.8 Patient is requesting to speak with Provider.

## 2023-02-16 DIAGNOSIS — J4541 Moderate persistent asthma with (acute) exacerbation: Secondary | ICD-10-CM | POA: Diagnosis not present

## 2023-02-19 ENCOUNTER — Telehealth: Payer: Self-pay | Admitting: Primary Care

## 2023-02-19 NOTE — Telephone Encounter (Signed)
Patient needs a prescription for an oxygen concentrator that she is going to self-pay for. It can be sent to her email if possible at [email or web address] Please call and advise [phone]

## 2023-02-20 NOTE — Telephone Encounter (Signed)
PT is calling again for a written RX for 02. Please call her @ [phone]  She had an [facility] recently. They gave her one 1 concentrator but she had a 2nd home and will be using [facility] Their phone # is [phone].  She is in that other home now and dragged her other with her.   States we gave her one and she lost it.

## 2023-02-21 NOTE — Telephone Encounter (Signed)
Called the pt and there was no answer- LMTCB

## 2023-03-02 ENCOUNTER — Other Ambulatory Visit: Payer: Self-pay

## 2023-03-02 DIAGNOSIS — J9611 Chronic respiratory failure with hypoxia: Secondary | ICD-10-CM

## 2023-03-02 DIAGNOSIS — I739 Peripheral vascular disease, unspecified: Secondary | ICD-10-CM
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
## 2023-03-15 ENCOUNTER — Ambulatory Visit (HOSPITAL_COMMUNITY)
Admission: RE | Admit: 2023-03-15 | Discharge: 2023-03-15 | Disposition: A | Discharge status: Home / Self Care | Payer: PPO | Source: Ambulatory Visit | Attending: Vascular Surgery | Admitting: Vascular Surgery

## 2023-03-15 ENCOUNTER — Other Ambulatory Visit: Payer: Self-pay

## 2023-03-15 ENCOUNTER — Encounter: Payer: Self-pay | Admitting: Vascular Surgery

## 2023-03-15 ENCOUNTER — Ambulatory Visit: Payer: PPO | Admitting: Vascular Surgery

## 2023-03-15 VITALS — BP 156/82 | HR 86 | Temp 98.0°F | Resp 20 | Ht 61.0 in | Wt 151.0 lb

## 2023-03-15 DIAGNOSIS — I739 Peripheral vascular disease, unspecified: Secondary | ICD-10-CM

## 2023-03-15 DIAGNOSIS — I70245 Atherosclerosis of native arteries of left leg with ulceration of other part of foot: Secondary | ICD-10-CM

## 2023-03-15 DIAGNOSIS — I70222 Atherosclerosis of native arteries of extremities with rest pain, left leg: Secondary | ICD-10-CM

## 2023-03-15 LAB — VAS US ABI WITH/WO TBI
Left ABI: 0.32
Right ABI: 0.75

## 2023-03-19 DIAGNOSIS — J4541 Moderate persistent asthma with (acute) exacerbation: Secondary | ICD-10-CM | POA: Diagnosis not present

## 2023-03-20 DIAGNOSIS — Z299 Encounter for prophylactic measures, unspecified: Secondary | ICD-10-CM | POA: Diagnosis not present

## 2023-03-20 DIAGNOSIS — I739 Peripheral vascular disease, unspecified: Secondary | ICD-10-CM | POA: Diagnosis not present

## 2023-03-20 DIAGNOSIS — I10 Essential (primary) hypertension: Secondary | ICD-10-CM | POA: Diagnosis not present

## 2023-03-20 DIAGNOSIS — G4700 Insomnia, unspecified: Secondary | ICD-10-CM | POA: Diagnosis not present

## 2023-03-20 DIAGNOSIS — J449 Chronic obstructive pulmonary disease, unspecified: Secondary | ICD-10-CM | POA: Diagnosis not present

## 2023-03-20 DIAGNOSIS — I70245 Atherosclerosis of native arteries of left leg with ulceration of other part of foot: Secondary | ICD-10-CM | POA: Diagnosis not present

## 2023-03-21 ENCOUNTER — Other Ambulatory Visit: Payer: Self-pay

## 2023-03-21 ENCOUNTER — Encounter (HOSPITAL_COMMUNITY)
Admission: RE | Disposition: A | Discharge status: Home / Self Care | Payer: Self-pay | Source: Home / Self Care | Attending: Vascular Surgery

## 2023-03-21 ENCOUNTER — Encounter (HOSPITAL_COMMUNITY): Payer: Self-pay | Admitting: Vascular Surgery

## 2023-03-21 ENCOUNTER — Ambulatory Visit (HOSPITAL_BASED_OUTPATIENT_CLINIC_OR_DEPARTMENT_OTHER): Payer: PPO

## 2023-03-21 ENCOUNTER — Ambulatory Visit (HOSPITAL_COMMUNITY)
Admission: RE | Admit: 2023-03-21 | Discharge: 2023-03-21 | Disposition: A | Discharge status: Home / Self Care | Payer: PPO | Attending: Vascular Surgery | Admitting: Vascular Surgery

## 2023-03-21 DIAGNOSIS — H548 Legal blindness, as defined in USA: Secondary | ICD-10-CM | POA: Diagnosis not present

## 2023-03-21 DIAGNOSIS — Z8249 Family history of ischemic heart disease and other diseases of the circulatory system: Secondary | ICD-10-CM | POA: Diagnosis not present

## 2023-03-21 DIAGNOSIS — I10 Essential (primary) hypertension: Secondary | ICD-10-CM | POA: Insufficient documentation

## 2023-03-21 DIAGNOSIS — D62 Acute posthemorrhagic anemia: Secondary | ICD-10-CM | POA: Diagnosis not present

## 2023-03-21 DIAGNOSIS — I743 Embolism and thrombosis of arteries of the lower extremities: Secondary | ICD-10-CM | POA: Diagnosis not present

## 2023-03-21 DIAGNOSIS — J45909 Unspecified asthma, uncomplicated: Secondary | ICD-10-CM | POA: Diagnosis not present

## 2023-03-21 DIAGNOSIS — Z87891 Personal history of nicotine dependence: Secondary | ICD-10-CM | POA: Insufficient documentation

## 2023-03-21 DIAGNOSIS — Z79899 Other long term (current) drug therapy: Secondary | ICD-10-CM | POA: Insufficient documentation

## 2023-03-21 DIAGNOSIS — M62262 Nontraumatic ischemic infarction of muscle, left lower leg: Secondary | ICD-10-CM

## 2023-03-21 DIAGNOSIS — Z9981 Dependence on supplemental oxygen: Secondary | ICD-10-CM | POA: Diagnosis not present

## 2023-03-21 DIAGNOSIS — I480 Paroxysmal atrial fibrillation: Secondary | ICD-10-CM | POA: Diagnosis not present

## 2023-03-21 DIAGNOSIS — G629 Polyneuropathy, unspecified: Secondary | ICD-10-CM | POA: Diagnosis not present

## 2023-03-21 DIAGNOSIS — L600 Ingrowing nail: Secondary | ICD-10-CM | POA: Insufficient documentation

## 2023-03-21 DIAGNOSIS — E785 Hyperlipidemia, unspecified: Secondary | ICD-10-CM | POA: Insufficient documentation

## 2023-03-21 DIAGNOSIS — I70222 Atherosclerosis of native arteries of extremities with rest pain, left leg: Secondary | ICD-10-CM | POA: Insufficient documentation

## 2023-03-21 DIAGNOSIS — I70245 Atherosclerosis of native arteries of left leg with ulceration of other part of foot: Secondary | ICD-10-CM | POA: Diagnosis not present

## 2023-03-21 DIAGNOSIS — J439 Emphysema, unspecified: Secondary | ICD-10-CM | POA: Diagnosis not present

## 2023-03-21 DIAGNOSIS — F32A Depression, unspecified: Secondary | ICD-10-CM | POA: Diagnosis not present

## 2023-03-21 LAB — POCT I-STAT, CHEM 8
BUN: 8 mg/dL (ref 8–23)
Calcium, Ion: 1.21 mmol/L (ref 1.15–1.40)
Chloride: 97 mmol/L — ABNORMAL LOW (ref 98–111)
Creatinine, Ser: 0.7 mg/dL (ref 0.44–1.00)
Glucose, Bld: 90 mg/dL (ref 70–99)
HCT: 38 % (ref 36.0–46.0)
Hemoglobin: 12.9 g/dL (ref 12.0–15.0)
Potassium: 4.3 mmol/L (ref 3.5–5.1)
Sodium: 134 mmol/L — ABNORMAL LOW (ref 135–145)
TCO2: 30 mmol/L (ref 22–32)

## 2023-03-21 LAB — CBC
HCT: 31.8 % — ABNORMAL LOW (ref 36.0–46.0)
Hemoglobin: 11 g/dL — ABNORMAL LOW (ref 12.0–15.0)
MCH: 34.7 pg — ABNORMAL HIGH (ref 26.0–34.0)
MCHC: 34.6 g/dL (ref 30.0–36.0)
MCV: 100.3 fL — ABNORMAL HIGH (ref 80.0–100.0)
Platelets: 222 10*3/uL (ref 150–400)
RBC: 3.17 MIL/uL — ABNORMAL LOW (ref 3.87–5.11)
RDW: 16 % — ABNORMAL HIGH (ref 11.5–15.5)
WBC: 7.4 10*3/uL (ref 4.0–10.5)
nRBC: 0 % (ref 0.0–0.2)

## 2023-03-21 LAB — COMPREHENSIVE METABOLIC PANEL
ALT: 43 U/L (ref 0–44)
AST: 53 U/L — ABNORMAL HIGH (ref 15–41)
Albumin: 3 g/dL — ABNORMAL LOW (ref 3.5–5.0)
Alkaline Phosphatase: 66 U/L (ref 38–126)
Anion gap: 8 (ref 5–15)
BUN: 6 mg/dL — ABNORMAL LOW (ref 8–23)
CO2: 29 mmol/L (ref 22–32)
Calcium: 9.1 mg/dL (ref 8.9–10.3)
Chloride: 100 mmol/L (ref 98–111)
Creatinine, Ser: 0.68 mg/dL (ref 0.44–1.00)
GFR, Estimated: >60 mL/min (ref >60)
Glucose, Bld: 121 mg/dL — ABNORMAL HIGH (ref 70–99)
Potassium: 3.6 mmol/L (ref 3.5–5.1)
Sodium: 137 mmol/L (ref 135–145)
Total Bilirubin: 0.6 mg/dL (ref 0.0–1.2)
Total Protein: 5.1 g/dL — ABNORMAL LOW (ref 6.5–8.1)

## 2023-03-21 LAB — URINALYSIS, COMPLETE (UACMP) WITH MICROSCOPIC
Bacteria, UA: NONE SEEN
Bilirubin Urine: NEGATIVE
Glucose, UA: NEGATIVE mg/dL
Hgb urine dipstick: NEGATIVE
Ketones, ur: NEGATIVE mg/dL
Leukocytes,Ua: NEGATIVE
Nitrite: NEGATIVE
Protein, ur: NEGATIVE mg/dL
Specific Gravity, Urine: >1.046 — ABNORMAL HIGH (ref 1.005–1.030)
pH: 7 (ref 5.0–8.0)

## 2023-03-21 LAB — ECHOCARDIOGRAM COMPLETE
Area-P 1/2: 3.99 cm2
Height: 61 in
S' Lateral: 2.9 cm
Weight: 2416 [oz_av]

## 2023-03-21 LAB — SURGICAL PCR SCREEN
MRSA, PCR: NEGATIVE
Staphylococcus aureus: NEGATIVE

## 2023-03-21 LAB — PROTIME-INR
INR: 1 (ref 0.8–1.2)
Prothrombin Time: 13.6 s (ref 11.4–15.2)

## 2023-03-21 LAB — APTT: aPTT: 27 s (ref 24–36)

## 2023-03-21 SURGERY — ABDOMINAL AORTOGRAM W/LOWER EXTREMITY
Anesthesia: LOCAL | Laterality: Left

## 2023-03-21 MED ORDER — IODIXANOL 320 MG/ML IV SOLN
INTRAVENOUS | Status: DC | PRN
  Administered 2023-03-21: 90 mL

## 2023-03-21 MED ORDER — SODIUM CHLORIDE 0.9 % WEIGHT BASED INFUSION: 1.0000 mL/kg/h | INTRAVENOUS | Status: DC

## 2023-03-21 MED ORDER — SODIUM CHLORIDE 0.9% FLUSH: 3.0000 mL | INTRAVENOUS | Status: DC | PRN

## 2023-03-21 MED ORDER — FENTANYL CITRATE (PF) 100 MCG/2ML IJ SOLN
INTRAMUSCULAR | Status: DC | PRN
  Administered 2023-03-21 (×2): 50 ug via INTRAVENOUS

## 2023-03-21 MED ORDER — FENTANYL CITRATE (PF) 100 MCG/2ML IJ SOLN
INTRAMUSCULAR | Status: AC
  Filled 2023-03-21: qty 2

## 2023-03-21 MED ORDER — MIDAZOLAM HCL 2 MG/2ML IJ SOLN
INTRAMUSCULAR | Status: AC
  Filled 2023-03-21: qty 2

## 2023-03-21 MED ORDER — ACETAMINOPHEN 325 MG PO TABS: 650.0000 mg | ORAL_TABLET | ORAL | Status: DC | PRN

## 2023-03-21 MED ORDER — MIDAZOLAM HCL 2 MG/2ML IJ SOLN
INTRAMUSCULAR | Status: DC | PRN
  Administered 2023-03-21 (×2): 1 mg via INTRAVENOUS

## 2023-03-21 MED ORDER — SODIUM CHLORIDE 0.9 % IV SOLN: 250.0000 mL | INTRAVENOUS | Status: DC | PRN

## 2023-03-21 MED ORDER — HYDRALAZINE HCL 20 MG/ML IJ SOLN: 5.0000 mg | INTRAMUSCULAR | Status: DC | PRN

## 2023-03-21 MED ORDER — LABETALOL HCL 5 MG/ML IV SOLN: 10.0000 mg | INTRAVENOUS | Status: DC | PRN

## 2023-03-21 MED ORDER — ONDANSETRON HCL 4 MG/2ML IJ SOLN: 4.0000 mg | Freq: Four times a day (QID) | INTRAMUSCULAR | Status: DC | PRN

## 2023-03-21 MED ORDER — HEPARIN (PORCINE) IN NACL 1000-0.9 UT/500ML-% IV SOLN
INTRAVENOUS | Status: DC | PRN
  Administered 2023-03-21 (×2): 500 mL

## 2023-03-21 MED ORDER — SODIUM CHLORIDE 0.9 % IV SOLN: INTRAVENOUS | Status: DC

## 2023-03-21 MED ORDER — LIDOCAINE HCL (PF) 1 % IJ SOLN
INTRAMUSCULAR | Status: AC
  Filled 2023-03-21: qty 30

## 2023-03-21 MED ORDER — LIDOCAINE HCL (PF) 1 % IJ SOLN
INTRAMUSCULAR | Status: DC | PRN
  Administered 2023-03-21: 15 mL

## 2023-03-21 MED ORDER — SODIUM CHLORIDE 0.9% FLUSH: 3.0000 mL | Freq: Two times a day (BID) | INTRAVENOUS | Status: DC

## 2023-03-21 SURGICAL SUPPLY — 10 items
CATH OMNI FLUSH 5F 65CM (CATHETERS) IMPLANT
COVER DOME SNAP 22 D (MISCELLANEOUS) IMPLANT
DEVICE CLOSURE MYNXGRIP 5F (Vascular Products) IMPLANT
KIT MICROPUNCTURE NIT STIFF (SHEATH) IMPLANT
SET ATX-X65L (MISCELLANEOUS) IMPLANT
SHEATH PINNACLE 5F 10CM (SHEATH) IMPLANT
SHEATH PROBE COVER 6X72 (BAG) IMPLANT
TRAY PV CATH (CUSTOM PROCEDURE TRAY) ×1 IMPLANT
WIRE BENTSON .035X145CM (WIRE) IMPLANT
WIRE TORQFLEX AUST .018X40CM (WIRE) IMPLANT
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
## 2023-03-21 NOTE — Progress Notes (Signed)
 BLE vein mapping has been completed.   Results can be found under chart review under CV PROC. 03/21/2023 1:33 PM Cassadie Pankonin RVT, RDMS

## 2023-03-21 NOTE — Progress Notes (Signed)
 Patient ambulated to BR, right groin remains unremarkable.

## 2023-03-21 NOTE — Progress Notes (Signed)
  Echocardiogram 2D Echocardiogram has been performed.  Delcie Roch 03/21/2023, 1:09 PM

## 2023-03-21 NOTE — Progress Notes (Signed)
 1141- spoke to Lupita Leash in Vascular about patient needing a Stat Echo and vein mapping done prior to discharge. (D/c time [id number]).

## 2023-03-21 NOTE — Op Note (Signed)
 Patient name: Gabrielle [name] MRN: [id number] DOB: 06-19-1955 Sex: female  03/21/2023 Pre-operative Diagnosis: Left lower extremity critical limb ischemia with rest pain and tissue loss at the left first toe Post-operative diagnosis:  Same Surgeon:  Victorino Sparrow, MD Procedure Performed: 1.  Ultrasound-guided micropuncture access of the right common femoral artery in retrograde fashion 2.  Aortogram 3.  Second-order cannulation, bilateral lower extremity angiogram 4.  Moderate sedation time 28 minutes, contrast volume 90 mL   Indications: Patient is a 68 year old female who presented to my office with a 9-month history of left lower extremity rest pain, with new onset tissue loss at the left great toe.  ABI was severely diminished.  No toe pressure.  After discussing the risks and benefits of left lower extremity angiogram with possible intervention in an effort to define and improve distal perfusion for wound healing, [name] elected to proceed.  Findings:   Aortogram: Patent celiac artery, superficial femoral artery, bilateral renal arteries. No flow-limiting stenosis in the aortoiliac segments bilaterally On the left: 30% stenosis distal external iliac artery.  Common femoral artery appears to be occluded distally.  There is a large branch, feeding profunda collaterals.  The the main branch of the profunda reconstitutes after roughly 4 cm.  The superficial femoral artery reconstitutes at the mid femur.  There is significant disease throughout the superficial femoral artery with what appears to be thrombus In the P2 segment of the popliteal artery.  The P3 segment appears to be relatively healthy.  There is single-vessel peroneal runoff to the foot.  On the right: Widely patent common femoral artery with moderate atherosclerotic disease.  The ostia of the superficial femoral artery has 70% stenosis, and the artery itself has severe atherosclerotic disease throughout.  The profunda is  widely patent.  The popliteal artery is diseased, but widely patent.  There appears to be two-vessel runoff to the via the anterior tibial artery and peroneal artery.     Procedure:  The patient was identified in the holding area and taken to room 8.  The patient was then placed supine on the table and prepped and draped in the usual sterile fashion.  A time out was called.  Ultrasound was used to evaluate the right common femoral artery.  It was patent .  A digital ultrasound image was acquired.  A micropuncture needle was used to access the right common femoral artery under ultrasound guidance.  An 018 wire was advanced without resistance and a micropuncture sheath was placed.  The 018 wire was removed and a benson wire was placed.  The micropuncture sheath was exchanged for a 5 french sheath.  An omniflush catheter was advanced over the wire to the level of L-1.  An abdominal angiogram was obtained.  Next, using the omniflush catheter and a benson wire, the aortic bifurcation was crossed and the catheter was placed into theleft external iliac artery and left runoff was obtained.  right runoff was performed via retrograde sheath injections. See results above.  Impression: Likely cardioembolic event leading to occlusion of the distal common femoral artery with reconstitution of the profunda and superficial femoral artery distally.  The patient will need open surgery.  With a wound on the foot, leasant would be best served with extensive left-sided femoral endarterectomy extended onto the profunda to recanalize the artery followed by femoral to below-knee popliteal artery bypass.     Victorino Sparrow MD [facility] [place] Office: [phone]

## 2023-03-22 ENCOUNTER — Encounter (HOSPITAL_COMMUNITY): Payer: Self-pay | Admitting: Vascular Surgery

## 2023-03-22 ENCOUNTER — Other Ambulatory Visit: Payer: Self-pay

## 2023-03-22 NOTE — Progress Notes (Signed)
 Anesthesia Chart Review: Same day workup  68 year old female follows with pulmonology for history of smoking (40 pack years, quit 2016), NSCLC s/p right upper lobectomy 2016 (did not require chemo or radiation), asthma, emphysema, nocturnal hypoxemia on 2 to 3 L O2 at night.  PFTs 2019 showed small airway obstruction, no significant bronchodilator response, normal lung volumes.  CT chest 01/12/23 showed s/p right upper lobectomy without evidence for recurrent or metastatic disease. No pneumonia. Emphysema, minimal fibrosis anterior lungs. Aortic atherosclerosis.  Recently established with vascular surgeon Dr. Karin Lieu for evaluation of left lower extremity pain and tissue loss.  She underwent angiogram 03/21/2023 showing, " Likely cardioembolic event leading to occlusion of the distal common femoral artery with reconstitution of the profunda and superficial femoral artery distally.  The patient will need open surgery.  With a wound on the foot, leasant would be best served with extensive left-sided femoral endarterectomy extended onto the profunda to recanalize the artery followed by femoral to below-knee popliteal artery bypass."  Given these results, Dr. Karin Lieu did also order an echo which was done that day and showed EF 65 to 70%, normal wall motion, grade 1 DD, normal RV function, no significant valvular abnormalities.  Other pertinent history includes HTN, GERD, breast cancer s/p right lumpectomy and SLNB 2014 (did not require chemotherapy, completed radiation therapy and Arimidex).   CMP and CBC from 03/21/2023 reviewed, mild anemia with hemoglobin 11.0, otherwise unremarkable.  She will need day of surgery evaluation.  EKG 03/21/2023: NSR.  Rate 68.  CT chest 01/12/2023: IMPRESSION: 1. Status post right upper lobectomy. No CT evidence for recurrent or metastatic disease. 2. Emphysema. 3. Aortic atherosclerosis.  TTE 03/21/23:  1. Left ventricular ejection fraction, by estimation, is 65 to  70%. The  left ventricle has normal function. The left ventricle has no regional  wall motion abnormalities. Left ventricular diastolic parameters are  consistent with Grade I diastolic  dysfunction (impaired relaxation).   2. Right ventricular systolic function is normal. The right ventricular  size is normal. Tricuspid regurgitation signal is inadequate for assessing  PA pressure.   3. The mitral valve is normal in structure. Trivial mitral valve  regurgitation. No evidence of mitral stenosis.   4. The aortic valve is tricuspid. There is mild calcification of the  aortic valve. Aortic valve regurgitation is not visualized. No aortic  stenosis is present.   5. The inferior vena cava is normal in size with greater than 50%  respiratory variability, suggesting right atrial pressure of 3 mmHg.     Zannie Cove [facility] Short Stay Center/Anesthesiology Phone [phone] 03/22/2023 9:49 AM

## 2023-03-22 NOTE — Anesthesia Preprocedure Evaluation (Signed)
 Anesthesia Evaluation  Patient identified by MRN, date of birth, ID band Patient awake    Reviewed: Allergy & Precautions, NPO status , Patient's Chart, lab work & pertinent test results  History of Anesthesia Complications Negative for: history of anesthetic complications  Airway Mallampati: II  TM Distance: >3 FB Neck ROM: Full    Dental  (+) Dental Advisory Given, Loose,    Pulmonary asthma , sleep apnea and Oxygen sleep apnea , former smoker  Hx lobectomy    Pulmonary exam normal        Cardiovascular hypertension, Pt. on medications and Pt. on home beta blockers + Peripheral Vascular Disease  Normal cardiovascular exam   '25 TTE - EF 65 to 70%. Grade I diastolic dysfunction (impaired relaxation). Trivial mitral valve regurgitation.     Neuro/Psych  PSYCHIATRIC DISORDERS Anxiety Depression    negative neurological ROS     GI/Hepatic ,GERD  Medicated and Controlled,,(+)     substance abuse    Endo/Other  negative endocrine ROS    Renal/GU negative Renal ROS     Musculoskeletal  (+) Arthritis ,    Abdominal   Peds  Hematology  (+) Blood dyscrasia, anemia   Anesthesia Other Findings Legally blind Hx eating d/o   Reproductive/Obstetrics                             Anesthesia Physical Anesthesia Plan  ASA: 4  Anesthesia Plan: General   Post-op Pain Management: Tylenol PO (pre-op)*   Induction: Intravenous  PONV Risk Score and Plan: 3 and Treatment may vary due to age or medical condition, Ondansetron, Dexamethasone and Midazolam  Airway Management Planned: Oral ETT  Additional Equipment: Arterial line  Intra-op Plan:   Post-operative Plan: Extubation in OR  Informed Consent: I have reviewed the patients History and Physical, chart, labs and discussed the procedure including the risks, benefits and alternatives for the proposed anesthesia with the patient or  authorized representative who has indicated his/her understanding and acceptance.     Dental advisory given  Plan Discussed with: CRNA and Anesthesiologist  Anesthesia Plan Comments: (PAT note by Antionette Poles, PA-C)        Anesthesia Quick Evaluation

## 2023-03-22 NOTE — Progress Notes (Signed)
 SDW call  Patient was given pre-op instructions over the phone. Patient verbalized understanding of instructions provided.     PCP - Dr. Doreen Beam Cardiologist -  Pulmonary: Ames Dura, NP   PPM/ICD - denies Device Orders - na Rep Notified - na   Chest x-ray - na EKG -  03/21/2023 Stress Test - 12/13/18 ECHO - 03/21/2023 Cardiac Cath -   Sleep Study/sleep apnea/CPAP: sleep apnea. States she uses oxygen 2-3 liters at night  Non-diabetic  Blood Thinner Instructions: denies Aspirin Instructions:denies   ERAS Protcol - NPO   COVID TEST- Patient states she was Covid + approx 6 weeks ago, Currently no symptoms.    Anesthesia review: Yes. HTN, SOB, sleep apnea   Patient denies shortness of breath, fever, cough and chest pain over the phone call  Your procedure is scheduled on Friday March 23, 2023  Report to [facility] Main Entrance "A" at  0735  A.M., then check in with the Admitting office.  Call this number if you have problems the morning of surgery:  [phone]   If you have any questions prior to your surgery date call [phone]: Open Monday-Friday 8am-4pm If you experience any cold or flu symptoms such as cough, fever, chills, shortness of breath, etc. between now and your scheduled surgery, please notify us at the above number    Remember:  Do not eat or drink after midnight the night before your surgery  Take these medicines the morning of surgery with A SIP OF WATER:  Breo ellipta, gabapentin, metoprolol  As needed: Albuterol neb/inhaler  As of today, STOP taking any Aspirin (unless otherwise instructed by your surgeon) Aleve, Naproxen, Ibuprofen, Motrin, Advil, Goody's, BC's, all herbal medications, fish oil, and all vitamins.

## 2023-03-23 ENCOUNTER — Encounter (HOSPITAL_COMMUNITY)
Admission: RE | Disposition: A | Discharge status: Home / Self Care | Payer: Self-pay | Source: Home / Self Care | Attending: Vascular Surgery

## 2023-03-23 ENCOUNTER — Inpatient Hospital Stay (HOSPITAL_COMMUNITY)
Admission: RE | Admit: 2023-03-23 | Discharge: 2023-03-29 | DRG: 271 | Disposition: A | Discharge status: Home Health | Payer: PPO | Attending: Vascular Surgery | Admitting: Vascular Surgery

## 2023-03-23 ENCOUNTER — Other Ambulatory Visit: Payer: Self-pay

## 2023-03-23 ENCOUNTER — Inpatient Hospital Stay (HOSPITAL_COMMUNITY): Payer: PPO | Admitting: Certified Registered"

## 2023-03-23 ENCOUNTER — Inpatient Hospital Stay (HOSPITAL_COMMUNITY): Payer: Self-pay | Admitting: Physician Assistant

## 2023-03-23 ENCOUNTER — Encounter (HOSPITAL_COMMUNITY): Payer: Self-pay | Admitting: Vascular Surgery

## 2023-03-23 ENCOUNTER — Inpatient Hospital Stay (HOSPITAL_COMMUNITY): Payer: PPO

## 2023-03-23 DIAGNOSIS — I70222 Atherosclerosis of native arteries of extremities with rest pain, left leg: Secondary | ICD-10-CM

## 2023-03-23 DIAGNOSIS — I743 Embolism and thrombosis of arteries of the lower extremities: Secondary | ICD-10-CM | POA: Diagnosis not present

## 2023-03-23 DIAGNOSIS — F32A Depression, unspecified: Secondary | ICD-10-CM | POA: Diagnosis not present

## 2023-03-23 DIAGNOSIS — Z806 Family history of leukemia: Secondary | ICD-10-CM

## 2023-03-23 DIAGNOSIS — Z885 Allergy status to narcotic agent status: Secondary | ICD-10-CM

## 2023-03-23 DIAGNOSIS — I959 Hypotension, unspecified: Secondary | ICD-10-CM | POA: Diagnosis not present

## 2023-03-23 DIAGNOSIS — F418 Other specified anxiety disorders: Secondary | ICD-10-CM | POA: Diagnosis not present

## 2023-03-23 DIAGNOSIS — Z7982 Long term (current) use of aspirin: Secondary | ICD-10-CM

## 2023-03-23 DIAGNOSIS — Z83438 Family history of other disorder of lipoprotein metabolism and other lipidemia: Secondary | ICD-10-CM

## 2023-03-23 DIAGNOSIS — Z7901 Long term (current) use of anticoagulants: Secondary | ICD-10-CM

## 2023-03-23 DIAGNOSIS — Z811 Family history of alcohol abuse and dependence: Secondary | ICD-10-CM

## 2023-03-23 DIAGNOSIS — Z87891 Personal history of nicotine dependence: Secondary | ICD-10-CM

## 2023-03-23 DIAGNOSIS — Z79899 Other long term (current) drug therapy: Secondary | ICD-10-CM | POA: Diagnosis not present

## 2023-03-23 DIAGNOSIS — I480 Paroxysmal atrial fibrillation: Secondary | ICD-10-CM | POA: Diagnosis not present

## 2023-03-23 DIAGNOSIS — D62 Acute posthemorrhagic anemia: Secondary | ICD-10-CM | POA: Diagnosis not present

## 2023-03-23 DIAGNOSIS — I10 Essential (primary) hypertension: Secondary | ICD-10-CM

## 2023-03-23 DIAGNOSIS — Z882 Allergy status to sulfonamides status: Secondary | ICD-10-CM | POA: Diagnosis not present

## 2023-03-23 DIAGNOSIS — Z8249 Family history of ischemic heart disease and other diseases of the circulatory system: Secondary | ICD-10-CM | POA: Diagnosis not present

## 2023-03-23 DIAGNOSIS — Z91411 Personal history of adult psychological abuse: Secondary | ICD-10-CM

## 2023-03-23 DIAGNOSIS — J439 Emphysema, unspecified: Secondary | ICD-10-CM | POA: Diagnosis present

## 2023-03-23 DIAGNOSIS — Z7951 Long term (current) use of inhaled steroids: Secondary | ICD-10-CM

## 2023-03-23 DIAGNOSIS — Z902 Acquired absence of lung [part of]: Secondary | ICD-10-CM

## 2023-03-23 DIAGNOSIS — G4730 Sleep apnea, unspecified: Secondary | ICD-10-CM | POA: Diagnosis present

## 2023-03-23 DIAGNOSIS — L600 Ingrowing nail: Secondary | ICD-10-CM | POA: Diagnosis present

## 2023-03-23 DIAGNOSIS — Z853 Personal history of malignant neoplasm of breast: Secondary | ICD-10-CM | POA: Diagnosis not present

## 2023-03-23 DIAGNOSIS — Z9981 Dependence on supplemental oxygen: Secondary | ICD-10-CM

## 2023-03-23 DIAGNOSIS — R2689 Other abnormalities of gait and mobility: Secondary | ICD-10-CM | POA: Diagnosis not present

## 2023-03-23 DIAGNOSIS — I70202 Unspecified atherosclerosis of native arteries of extremities, left leg: Secondary | ICD-10-CM | POA: Diagnosis not present

## 2023-03-23 DIAGNOSIS — Z841 Family history of disorders of kidney and ureter: Secondary | ICD-10-CM

## 2023-03-23 DIAGNOSIS — Z818 Family history of other mental and behavioral disorders: Secondary | ICD-10-CM

## 2023-03-23 DIAGNOSIS — Z85118 Personal history of other malignant neoplasm of bronchus and lung: Secondary | ICD-10-CM

## 2023-03-23 DIAGNOSIS — Z825 Family history of asthma and other chronic lower respiratory diseases: Secondary | ICD-10-CM

## 2023-03-23 DIAGNOSIS — I70229 Atherosclerosis of native arteries of extremities with rest pain, unspecified extremity: Secondary | ICD-10-CM | POA: Diagnosis present

## 2023-03-23 DIAGNOSIS — J45909 Unspecified asthma, uncomplicated: Secondary | ICD-10-CM | POA: Diagnosis not present

## 2023-03-23 DIAGNOSIS — R5381 Other malaise: Secondary | ICD-10-CM | POA: Diagnosis not present

## 2023-03-23 DIAGNOSIS — Z807 Family history of other malignant neoplasms of lymphoid, hematopoietic and related tissues: Secondary | ICD-10-CM

## 2023-03-23 DIAGNOSIS — E785 Hyperlipidemia, unspecified: Secondary | ICD-10-CM | POA: Diagnosis not present

## 2023-03-23 DIAGNOSIS — I70245 Atherosclerosis of native arteries of left leg with ulceration of other part of foot: Secondary | ICD-10-CM

## 2023-03-23 DIAGNOSIS — H548 Legal blindness, as defined in USA: Secondary | ICD-10-CM | POA: Diagnosis not present

## 2023-03-23 DIAGNOSIS — G629 Polyneuropathy, unspecified: Secondary | ICD-10-CM | POA: Diagnosis not present

## 2023-03-23 DIAGNOSIS — K219 Gastro-esophageal reflux disease without esophagitis: Secondary | ICD-10-CM | POA: Diagnosis present

## 2023-03-23 DIAGNOSIS — Z604 Social exclusion and rejection: Secondary | ICD-10-CM | POA: Diagnosis present

## 2023-03-23 DIAGNOSIS — Z8261 Family history of arthritis: Secondary | ICD-10-CM

## 2023-03-23 DIAGNOSIS — Z822 Family history of deafness and hearing loss: Secondary | ICD-10-CM

## 2023-03-23 DIAGNOSIS — Z823 Family history of stroke: Secondary | ICD-10-CM

## 2023-03-23 LAB — CBC
HCT: 27.2 % — ABNORMAL LOW (ref 36.0–46.0)
Hemoglobin: 9.5 g/dL — ABNORMAL LOW (ref 12.0–15.0)
MCH: 34.1 pg — ABNORMAL HIGH (ref 26.0–34.0)
MCHC: 34.9 g/dL (ref 30.0–36.0)
MCV: 97.5 fL (ref 80.0–100.0)
Platelets: 168 10*3/uL (ref 150–400)
RBC: 2.79 MIL/uL — ABNORMAL LOW (ref 3.87–5.11)
RDW: 17.4 % — ABNORMAL HIGH (ref 11.5–15.5)
WBC: 13 10*3/uL — ABNORMAL HIGH (ref 4.0–10.5)
nRBC: 0 % (ref 0.0–0.2)

## 2023-03-23 LAB — POCT ACTIVATED CLOTTING TIME
Activated Clotting Time: 262 s
Activated Clotting Time: 227 s
Activated Clotting Time: 262 s

## 2023-03-23 LAB — PREPARE RBC (CROSSMATCH)

## 2023-03-23 SURGERY — ENDARTERECTOMY, FEMORAL
Anesthesia: General | Site: Leg Upper | Laterality: Left

## 2023-03-23 SURGERY — ANGIOGRAM, LOWER EXTREMITY
Anesthesia: General | Laterality: Left

## 2023-03-23 MED ORDER — HEPARIN (PORCINE) 25000 UT/250ML-% IV SOLN
1800.0000 [IU]/h | INTRAVENOUS | Status: DC
  Administered 2023-03-24 – 2023-03-25 (×2): 500 [IU]/h via INTRAVENOUS
  Administered 2023-03-28: 1500 [IU]/h via INTRAVENOUS
  Administered 2023-03-29: 1800 [IU]/h via INTRAVENOUS
  Filled 2023-03-23 (×5): qty 250

## 2023-03-23 MED ORDER — ROCURONIUM BROMIDE 10 MG/ML (PF) SYRINGE
PREFILLED_SYRINGE | INTRAVENOUS | Status: AC
  Filled 2023-03-23: qty 10

## 2023-03-23 MED ORDER — METOPROLOL SUCCINATE ER 50 MG PO TB24
50.0000 mg | ORAL_TABLET | Freq: Every morning | ORAL | Status: DC
  Administered 2023-03-24: 50 mg via ORAL
  Filled 2023-03-23: qty 1

## 2023-03-23 MED ORDER — DEXAMETHASONE SODIUM PHOSPHATE 10 MG/ML IJ SOLN
INTRAMUSCULAR | Status: DC | PRN
  Administered 2023-03-23: 10 mg via INTRAVENOUS

## 2023-03-23 MED ORDER — PHENYLEPHRINE 80 MCG/ML (10ML) SYRINGE FOR IV PUSH (FOR BLOOD PRESSURE SUPPORT)
PREFILLED_SYRINGE | INTRAVENOUS | Status: DC | PRN
  Administered 2023-03-23: 40 ug via INTRAVENOUS

## 2023-03-23 MED ORDER — PHENOL 1.4 % MT LIQD: 1.0000 | OROMUCOSAL | Status: DC | PRN

## 2023-03-23 MED ORDER — METOPROLOL TARTRATE 5 MG/5ML IV SOLN: 2.0000 mg | INTRAVENOUS | Status: DC | PRN

## 2023-03-23 MED ORDER — HEPARIN 6000 UNIT IRRIGATION SOLUTION
Status: DC | PRN
  Administered 2023-03-23: 1

## 2023-03-23 MED ORDER — LACTATED RINGERS IV SOLN: INTRAVENOUS | Status: DC | PRN

## 2023-03-23 MED ORDER — HEPARIN 6000 UNIT IRRIGATION SOLUTION
Status: AC
  Filled 2023-03-23: qty 500

## 2023-03-23 MED ORDER — HYDROMORPHONE HCL 1 MG/ML IJ SOLN
INTRAMUSCULAR | Status: DC | PRN
Start: 2023-03-23 — End: 2023-03-23
  Administered 2023-03-23 (×2): .25 mg via INTRAVENOUS

## 2023-03-23 MED ORDER — FENTANYL CITRATE (PF) 250 MCG/5ML IJ SOLN
INTRAMUSCULAR | Status: AC
  Filled 2023-03-23: qty 5

## 2023-03-23 MED ORDER — PROTAMINE SULFATE 10 MG/ML IV SOLN
INTRAVENOUS | Status: DC | PRN
  Administered 2023-03-23: 20 mg via INTRAVENOUS
  Administered 2023-03-23: 30 mg via INTRAVENOUS

## 2023-03-23 MED ORDER — TEMAZEPAM 15 MG PO CAPS
15.0000 mg | ORAL_CAPSULE | Freq: Every day | ORAL | Status: DC
  Administered 2023-03-23 – 2023-03-28 (×6): 15 mg via ORAL
  Filled 2023-03-23 (×6): qty 1

## 2023-03-23 MED ORDER — OXYCODONE-ACETAMINOPHEN 5-325 MG PO TABS
1.0000 | ORAL_TABLET | ORAL | Status: DC | PRN
  Administered 2023-03-24: 1 via ORAL
  Administered 2023-03-24 – 2023-03-25 (×3): 2 via ORAL
  Administered 2023-03-25: 1 via ORAL
  Administered 2023-03-26 (×2): 2 via ORAL
  Administered 2023-03-26 – 2023-03-27 (×2): 1 via ORAL
  Administered 2023-03-27: 2 via ORAL
  Administered 2023-03-27 – 2023-03-28 (×5): 1 via ORAL
  Administered 2023-03-29 (×2): 2 via ORAL
  Filled 2023-03-23 (×2): qty 1
  Filled 2023-03-23: qty 2
  Filled 2023-03-23: qty 1
  Filled 2023-03-23: qty 2
  Filled 2023-03-23: qty 1
  Filled 2023-03-23: qty 2
  Filled 2023-03-23: qty 1
  Filled 2023-03-23 (×2): qty 2
  Filled 2023-03-23: qty 1
  Filled 2023-03-23 (×2): qty 2
  Filled 2023-03-23 (×3): qty 1
  Filled 2023-03-23 (×2): qty 2

## 2023-03-23 MED ORDER — DEXAMETHASONE SODIUM PHOSPHATE 10 MG/ML IJ SOLN
INTRAMUSCULAR | Status: AC
  Filled 2023-03-23: qty 1

## 2023-03-23 MED ORDER — CEFAZOLIN SODIUM-DEXTROSE 2-4 GM/100ML-% IV SOLN
2.0000 g | INTRAVENOUS | Status: AC
  Administered 2023-03-23 (×2): 2 g via INTRAVENOUS
  Filled 2023-03-23: qty 100

## 2023-03-23 MED ORDER — TRAZODONE HCL 100 MG PO TABS
100.0000 mg | ORAL_TABLET | Freq: Every day | ORAL | Status: DC
  Administered 2023-03-23 – 2023-03-28 (×6): 100 mg via ORAL
  Filled 2023-03-23: qty 2
  Filled 2023-03-23: qty 1
  Filled 2023-03-23: qty 2
  Filled 2023-03-23: qty 1
  Filled 2023-03-23: qty 2
  Filled 2023-03-23: qty 1

## 2023-03-23 MED ORDER — CHLORHEXIDINE GLUCONATE CLOTH 2 % EX PADS
6.0000 | MEDICATED_PAD | Freq: Every day | CUTANEOUS | Status: AC
Start: 2023-03-24 — End: ?
  Administered 2023-03-25: 6 via TOPICAL

## 2023-03-23 MED ORDER — HEMOSTATIC AGENTS (NO CHARGE) OPTIME
TOPICAL | Status: DC | PRN
  Administered 2023-03-23 (×3): 1 via TOPICAL

## 2023-03-23 MED ORDER — CEFAZOLIN SODIUM-DEXTROSE 2-3 GM-%(50ML) IV SOLR
INTRAVENOUS | Status: DC | PRN
  Administered 2023-03-23: 2 g via INTRAVENOUS

## 2023-03-23 MED ORDER — ONDANSETRON HCL 4 MG/2ML IJ SOLN
INTRAMUSCULAR | Status: DC | PRN
  Administered 2023-03-23: 4 mg via INTRAVENOUS

## 2023-03-23 MED ORDER — SODIUM CHLORIDE 0.9% FLUSH: 10.0000 mL | INTRAVENOUS | Status: DC | PRN

## 2023-03-23 MED ORDER — ESCITALOPRAM OXALATE 20 MG PO TABS
20.0000 mg | ORAL_TABLET | Freq: Every day | ORAL | Status: DC
  Administered 2023-03-23 – 2023-03-28 (×6): 20 mg via ORAL
  Filled 2023-03-23: qty 2
  Filled 2023-03-23: qty 1
  Filled 2023-03-23 (×2): qty 2
  Filled 2023-03-23 (×2): qty 1

## 2023-03-23 MED ORDER — FENTANYL CITRATE (PF) 250 MCG/5ML IJ SOLN
INTRAMUSCULAR | Status: DC | PRN
  Administered 2023-03-23 (×5): 50 ug via INTRAVENOUS

## 2023-03-23 MED ORDER — HEPARIN SODIUM (PORCINE) 1000 UNIT/ML IJ SOLN
INTRAMUSCULAR | Status: DC | PRN
  Administered 2023-03-23: 7000 [IU] via INTRAVENOUS
  Administered 2023-03-23: 3000 [IU] via INTRAVENOUS

## 2023-03-23 MED ORDER — LABETALOL HCL 5 MG/ML IV SOLN: 10.0000 mg | INTRAVENOUS | Status: DC | PRN

## 2023-03-23 MED ORDER — 0.9 % SODIUM CHLORIDE (POUR BTL) OPTIME
TOPICAL | Status: DC | PRN
  Administered 2023-03-23: 2000 mL

## 2023-03-23 MED ORDER — ONDANSETRON HCL 4 MG/2ML IJ SOLN
INTRAMUSCULAR | Status: AC
  Filled 2023-03-23: qty 2

## 2023-03-23 MED ORDER — KETAMINE HCL 50 MG/5ML IJ SOSY
PREFILLED_SYRINGE | INTRAMUSCULAR | Status: AC
  Filled 2023-03-23: qty 5

## 2023-03-23 MED ORDER — LISINOPRIL 20 MG PO TABS: 20.0000 mg | ORAL_TABLET | Freq: Every day | ORAL | Status: DC

## 2023-03-23 MED ORDER — SUGAMMADEX SODIUM 200 MG/2ML IV SOLN
INTRAVENOUS | Status: DC | PRN
  Administered 2023-03-23: 50 mg via INTRAVENOUS
  Administered 2023-03-23: 150 mg via INTRAVENOUS

## 2023-03-23 MED ORDER — MORPHINE SULFATE (PF) 2 MG/ML IV SOLN
2.0000 mg | INTRAVENOUS | Status: DC | PRN
  Administered 2023-03-23 – 2023-03-28 (×6): 2 mg via INTRAVENOUS
  Filled 2023-03-23 (×7): qty 1

## 2023-03-23 MED ORDER — PHENYLEPHRINE 80 MCG/ML (10ML) SYRINGE FOR IV PUSH (FOR BLOOD PRESSURE SUPPORT)
PREFILLED_SYRINGE | INTRAVENOUS | Status: DC | PRN
  Administered 2023-03-23 (×2): 160 ug via INTRAVENOUS

## 2023-03-23 MED ORDER — VASOPRESSIN 20 UNIT/ML IV SOLN
INTRAVENOUS | Status: DC | PRN
  Administered 2023-03-23 (×2): 1 [IU] via INTRAVENOUS

## 2023-03-23 MED ORDER — ACETAMINOPHEN 500 MG PO TABS
1000.0000 mg | ORAL_TABLET | Freq: Once | ORAL | Status: AC
  Administered 2023-03-23: 1000 mg via ORAL
  Filled 2023-03-23: qty 2

## 2023-03-23 MED ORDER — LIDOCAINE 2% (20 MG/ML) 5 ML SYRINGE
INTRAMUSCULAR | Status: AC
  Filled 2023-03-23: qty 5

## 2023-03-23 MED ORDER — ALBUMIN HUMAN 5 % IV SOLN: INTRAVENOUS | Status: DC | PRN

## 2023-03-23 MED ORDER — ASPIRIN 300 MG RE SUPP
300.0000 mg | Freq: Once | RECTAL | Status: DC
  Filled 2023-03-23: qty 1

## 2023-03-23 MED ORDER — SODIUM CHLORIDE 0.9 % IV SOLN: INTRAVENOUS | Status: AC

## 2023-03-23 MED ORDER — HEPARIN SODIUM (PORCINE) 1000 UNIT/ML IJ SOLN
INTRAMUSCULAR | Status: DC | PRN
  Administered 2023-03-23: 5000 [IU] via INTRAVENOUS
  Administered 2023-03-23: 3000 [IU] via INTRAVENOUS

## 2023-03-23 MED ORDER — GUAIFENESIN-DM 100-10 MG/5ML PO SYRP: 15.0000 mL | ORAL_SOLUTION | ORAL | Status: DC | PRN

## 2023-03-23 MED ORDER — MAGNESIUM SULFATE 2 GM/50ML IV SOLN: 2.0000 g | Freq: Every day | INTRAVENOUS | Status: DC | PRN

## 2023-03-23 MED ORDER — ASPIRIN 81 MG PO CHEW
81.0000 mg | CHEWABLE_TABLET | Freq: Every day | ORAL | Status: DC
  Administered 2023-03-23 – 2023-03-28 (×6): 81 mg via ORAL
  Filled 2023-03-23 (×6): qty 1

## 2023-03-23 MED ORDER — SUGAMMADEX SODIUM 200 MG/2ML IV SOLN
INTRAVENOUS | Status: DC | PRN
  Administered 2023-03-23: 200 mg via INTRAVENOUS

## 2023-03-23 MED ORDER — VASOPRESSIN 20 UNIT/ML IV SOLN
INTRAVENOUS | Status: DC | PRN
  Administered 2023-03-23: 1 [IU] via INTRAVENOUS

## 2023-03-23 MED ORDER — PROPOFOL 10 MG/ML IV BOLUS
INTRAVENOUS | Status: DC | PRN
  Administered 2023-03-23: 30 mg via INTRAVENOUS
  Administered 2023-03-23: 150 mg via INTRAVENOUS
  Administered 2023-03-23: 30 mg via INTRAVENOUS

## 2023-03-23 MED ORDER — PHENYLEPHRINE HCL-NACL 20-0.9 MG/250ML-% IV SOLN
INTRAVENOUS | Status: DC | PRN
  Administered 2023-03-23: 40 ug/min via INTRAVENOUS

## 2023-03-23 MED ORDER — DOCUSATE SODIUM 100 MG PO CAPS
100.0000 mg | ORAL_CAPSULE | Freq: Every day | ORAL | Status: DC
  Administered 2023-03-25 – 2023-03-29 (×5): 100 mg via ORAL
  Filled 2023-03-23 (×6): qty 1

## 2023-03-23 MED ORDER — GABAPENTIN 400 MG PO CAPS
800.0000 mg | ORAL_CAPSULE | Freq: Two times a day (BID) | ORAL | Status: DC
  Administered 2023-03-23 – 2023-03-29 (×12): 800 mg via ORAL
  Filled 2023-03-23 (×12): qty 2

## 2023-03-23 MED ORDER — LIDOCAINE 2% (20 MG/ML) 5 ML SYRINGE
INTRAMUSCULAR | Status: DC | PRN
Start: 2023-03-23 — End: 2023-03-23
  Administered 2023-03-23: 60 mg via INTRAVENOUS

## 2023-03-23 MED ORDER — CHLORHEXIDINE GLUCONATE CLOTH 2 % EX PADS: 6.0000 | MEDICATED_PAD | Freq: Once | CUTANEOUS | Status: DC

## 2023-03-23 MED ORDER — PROPOFOL 10 MG/ML IV BOLUS
INTRAVENOUS | Status: AC
  Filled 2023-03-23: qty 20

## 2023-03-23 MED ORDER — LIDOCAINE 2% (20 MG/ML) 5 ML SYRINGE
INTRAMUSCULAR | Status: DC | PRN
  Administered 2023-03-23: 60 mg via INTRAVENOUS

## 2023-03-23 MED ORDER — ROCURONIUM BROMIDE 10 MG/ML (PF) SYRINGE
PREFILLED_SYRINGE | INTRAVENOUS | Status: DC | PRN
  Administered 2023-03-23 (×2): 20 mg via INTRAVENOUS
  Administered 2023-03-23: 60 mg via INTRAVENOUS

## 2023-03-23 MED ORDER — ALUM & MAG HYDROXIDE-SIMETH 200-200-20 MG/5ML PO SUSP: 15.0000 mL | ORAL | Status: DC | PRN

## 2023-03-23 MED ORDER — OXYCODONE HCL 5 MG PO TABS: 5.0000 mg | ORAL_TABLET | Freq: Once | ORAL | Status: DC | PRN

## 2023-03-23 MED ORDER — ROCURONIUM BROMIDE 10 MG/ML (PF) SYRINGE
PREFILLED_SYRINGE | INTRAVENOUS | Status: DC | PRN
  Administered 2023-03-23 (×3): 50 mg via INTRAVENOUS

## 2023-03-23 MED ORDER — OXYCODONE HCL 5 MG/5ML PO SOLN: 5.0000 mg | Freq: Once | ORAL | Status: DC | PRN

## 2023-03-23 MED ORDER — HEPARIN (PORCINE) 25000 UT/250ML-% IV SOLN
INTRAVENOUS | Status: AC
  Filled 2023-03-23: qty 250

## 2023-03-23 MED ORDER — ACETAMINOPHEN 325 MG PO TABS
325.0000 mg | ORAL_TABLET | ORAL | Status: DC | PRN
  Administered 2023-03-24 – 2023-03-26 (×3): 650 mg via ORAL
  Filled 2023-03-23 (×3): qty 2

## 2023-03-23 MED ORDER — KETAMINE HCL 10 MG/ML IJ SOLN
INTRAMUSCULAR | Status: DC | PRN
  Administered 2023-03-23: 30 mg via INTRAVENOUS

## 2023-03-23 MED ORDER — MIDAZOLAM HCL 2 MG/2ML IJ SOLN
INTRAMUSCULAR | Status: DC | PRN
  Administered 2023-03-23: 2 mg via INTRAVENOUS

## 2023-03-23 MED ORDER — CHLORHEXIDINE GLUCONATE 0.12 % MT SOLN
15.0000 mL | Freq: Once | OROMUCOSAL | Status: AC
  Administered 2023-03-23: 15 mL via OROMUCOSAL
  Filled 2023-03-23: qty 15

## 2023-03-23 MED ORDER — HEPARIN SODIUM (PORCINE) 1000 UNIT/ML IJ SOLN
8000.0000 [IU] | Freq: Once | INTRAMUSCULAR | Status: AC
  Administered 2023-03-23: 8000 [IU] via INTRAVENOUS

## 2023-03-23 MED ORDER — SODIUM CHLORIDE 0.9 % IV SOLN
INTRAVENOUS | Status: DC
Start: 2023-03-23 — End: 2023-03-23

## 2023-03-23 MED ORDER — SODIUM CHLORIDE 0.9% FLUSH
10.0000 mL | Freq: Two times a day (BID) | INTRAVENOUS | Status: DC
  Administered 2023-03-23 – 2023-03-24 (×2): 10 mL
  Administered 2023-03-24: 30 mL
  Administered 2023-03-25 (×2): 10 mL
  Administered 2023-03-26: 20 mL
  Administered 2023-03-26 – 2023-03-29 (×6): 10 mL

## 2023-03-23 MED ORDER — ALBUTEROL SULFATE (2.5 MG/3ML) 0.083% IN NEBU
2.5000 mg | INHALATION_SOLUTION | Freq: Four times a day (QID) | RESPIRATORY_TRACT | Status: DC | PRN
Start: 2023-03-23 — End: 2023-03-29
  Administered 2023-03-24 – 2023-03-27 (×3): 2.5 mg via RESPIRATORY_TRACT
  Filled 2023-03-23 (×3): qty 3

## 2023-03-23 MED ORDER — HEPARIN SODIUM (PORCINE) 5000 UNIT/ML IJ SOLN: 5000.0000 [IU] | Freq: Three times a day (TID) | INTRAMUSCULAR | Status: DC

## 2023-03-23 MED ORDER — ORAL CARE MOUTH RINSE: 15.0000 mL | Freq: Once | OROMUCOSAL | Status: AC

## 2023-03-23 MED ORDER — POTASSIUM CHLORIDE CRYS ER 20 MEQ PO TBCR
20.0000 meq | EXTENDED_RELEASE_TABLET | Freq: Every day | ORAL | Status: AC | PRN
  Administered 2023-03-24: 20 meq via ORAL
  Filled 2023-03-23: qty 1

## 2023-03-23 MED ORDER — ACETAMINOPHEN 650 MG RE SUPP: 325.0000 mg | RECTAL | Status: DC | PRN

## 2023-03-23 MED ORDER — MIDAZOLAM HCL 2 MG/2ML IJ SOLN
INTRAMUSCULAR | Status: AC
  Filled 2023-03-23: qty 2

## 2023-03-23 MED ORDER — SODIUM CHLORIDE 0.9 % IV SOLN: 10.0000 mL/h | Freq: Once | INTRAVENOUS | Status: DC

## 2023-03-23 MED ORDER — CHLORHEXIDINE GLUCONATE CLOTH 2 % EX PADS
6.0000 | MEDICATED_PAD | Freq: Every day | CUTANEOUS | Status: DC
  Administered 2023-03-24 – 2023-03-28 (×5): 6 via TOPICAL

## 2023-03-23 MED ORDER — SODIUM CHLORIDE 0.9 % IV SOLN: 500.0000 mL | Freq: Once | INTRAVENOUS | Status: DC | PRN

## 2023-03-23 MED ORDER — SUGAMMADEX SODIUM 200 MG/2ML IV SOLN
INTRAVENOUS | Status: AC
  Filled 2023-03-23: qty 2

## 2023-03-23 MED ORDER — ATORVASTATIN CALCIUM 40 MG PO TABS
40.0000 mg | ORAL_TABLET | Freq: Every day | ORAL | Status: DC
  Administered 2023-03-23 – 2023-03-28 (×6): 40 mg via ORAL
  Filled 2023-03-23 (×6): qty 1

## 2023-03-23 MED ORDER — HEMOSTATIC AGENTS (NO CHARGE) OPTIME
TOPICAL | Status: DC | PRN
  Administered 2023-03-23 (×4): 1 via TOPICAL

## 2023-03-23 MED ORDER — ONDANSETRON HCL 4 MG/2ML IJ SOLN: 4.0000 mg | Freq: Once | INTRAMUSCULAR | Status: DC | PRN

## 2023-03-23 MED ORDER — PANTOPRAZOLE SODIUM 40 MG PO TBEC
40.0000 mg | DELAYED_RELEASE_TABLET | Freq: Every day | ORAL | Status: DC
Start: 2023-03-24 — End: 2023-03-29
  Administered 2023-03-24 – 2023-03-29 (×6): 40 mg via ORAL
  Filled 2023-03-23 (×6): qty 1

## 2023-03-23 MED ORDER — VASOPRESSIN 20 UNIT/ML IV SOLN
INTRAVENOUS | Status: AC
  Filled 2023-03-23: qty 1

## 2023-03-23 MED ORDER — IODIXANOL 320 MG/ML IV SOLN
INTRAVENOUS | Status: DC | PRN
  Administered 2023-03-23: 95 mL via INTRA_ARTERIAL

## 2023-03-23 MED ORDER — HYDROMORPHONE HCL 1 MG/ML IJ SOLN
INTRAMUSCULAR | Status: AC
  Filled 2023-03-23: qty 0.5

## 2023-03-23 MED ORDER — ALBUMIN HUMAN 5 % IV SOLN
INTRAVENOUS | Status: DC | PRN
Start: 2023-03-23 — End: 2023-03-23

## 2023-03-23 MED ORDER — FENTANYL CITRATE (PF) 100 MCG/2ML IJ SOLN: 25.0000 ug | INTRAMUSCULAR | Status: DC | PRN

## 2023-03-23 MED ORDER — ONDANSETRON HCL 4 MG/2ML IJ SOLN: 4.0000 mg | Freq: Four times a day (QID) | INTRAMUSCULAR | Status: DC | PRN

## 2023-03-23 MED ORDER — HYDRALAZINE HCL 20 MG/ML IJ SOLN: 5.0000 mg | INTRAMUSCULAR | Status: DC | PRN

## 2023-03-23 MED ORDER — SODIUM BICARBONATE 8.4 % IV SOLN
INTRAVENOUS | Status: DC | PRN
  Administered 2023-03-23: 50 meq via INTRAVENOUS

## 2023-03-23 MED ORDER — PHENYLEPHRINE HCL-NACL 20-0.9 MG/250ML-% IV SOLN
INTRAVENOUS | Status: DC | PRN
  Administered 2023-03-23: 20 ug/min via INTRAVENOUS
  Administered 2023-03-23: 50 ug/min via INTRAVENOUS

## 2023-03-23 MED ORDER — PROPOFOL 10 MG/ML IV BOLUS
INTRAVENOUS | Status: DC | PRN
  Administered 2023-03-23: 120 mg via INTRAVENOUS

## 2023-03-23 SURGICAL SUPPLY — 84 items
APPLIER CLIP 9.375 SM OPEN (CLIP) ×1 IMPLANT
BAG BANDED W/RUBBER/TAPE 36X54 (MISCELLANEOUS) ×1 IMPLANT
BAG COUNTER SPONGE SURGICOUNT (BAG) ×1 IMPLANT
BAG SNAP BAND KOVER 36X36 (MISCELLANEOUS) IMPLANT
BANDAGE ESMARK 6X9 LF (GAUZE/BANDAGES/DRESSINGS) IMPLANT
BLADE CLIPPER SURG (BLADE) ×1 IMPLANT
BNDG ESMARK 6X9 LF (GAUZE/BANDAGES/DRESSINGS) IMPLANT
CANISTER SUCT 3000ML PPV (MISCELLANEOUS) ×1 IMPLANT
CATH ANGIO 5F BER2 65CM (CATHETERS) IMPLANT
CATH EMB 2FR 60 (CATHETERS) IMPLANT
CATH EMB 3FR 80 (CATHETERS) IMPLANT
CATH OMNI FLUSH 5F 65CM (CATHETERS) IMPLANT
CHLORAPREP W/TINT 26 (MISCELLANEOUS) ×1 IMPLANT
CLIP APPLIE 9.375 SM OPEN (CLIP) ×1 IMPLANT
CLIP FOGARTY SPRING 6M (CLIP) IMPLANT
CLIP TI MEDIUM 24 (CLIP) ×1 IMPLANT
CLIP TI MEDIUM 6 (CLIP) ×1 IMPLANT
CLIP TI WIDE RED SMALL 24 (CLIP) ×1 IMPLANT
CLIP TI WIDE RED SMALL 6 (CLIP) ×1 IMPLANT
COVER DOME SNAP 22 D (MISCELLANEOUS) ×1 IMPLANT
COVER PROBE W GEL 5X96 (DRAPES) ×1 IMPLANT
CUFF TOURN SGL QUICK 42 (TOURNIQUET CUFF) IMPLANT
CUFF TRNQT CYL 24X4X16.5-23 (TOURNIQUET CUFF) IMPLANT
CUFF TRNQT CYL 34X4.125X (TOURNIQUET CUFF) IMPLANT
DERMABOND ADVANCED .7 DNX12 (GAUZE/BANDAGES/DRESSINGS) ×1 IMPLANT
DEVICE CLOSURE MYNXGRIP 5F (Vascular Products) IMPLANT
DEVICE TORQUE KENDALL .025-038 (MISCELLANEOUS) IMPLANT
DRAIN CHANNEL 15F RND FF W/TCR (WOUND CARE) IMPLANT
DRAPE C-ARM 42X72 X-RAY (DRAPES) IMPLANT
DRAPE FEMORAL ANGIO 80X135IN (DRAPES) ×1 IMPLANT
DRAPE INCISE IOBAN 66X45 STRL (DRAPES) IMPLANT
DRESSING PEEL AND PLC PRVNA 13 (GAUZE/BANDAGES/DRESSINGS) IMPLANT
DRSG PEEL AND PLACE PREVENA 13 (GAUZE/BANDAGES/DRESSINGS) ×1 IMPLANT
ELECT REM PT RETURN 9FT ADLT (ELECTROSURGICAL) ×1 IMPLANT
ELECTRODE REM PT RTRN 9FT ADLT (ELECTROSURGICAL) ×1 IMPLANT
EVACUATOR SILICONE 100CC (DRAIN) IMPLANT
GAUZE 4X4 16PLY ~~LOC~~+RFID DBL (SPONGE) ×1 IMPLANT
GLIDEWIRE ADV .035X180CM (WIRE) IMPLANT
GLOVE BIO SURGEON STRL SZ7.5 (GLOVE) IMPLANT
GLOVE BIOGEL PI IND STRL 7.0 (GLOVE) IMPLANT
GLOVE BIOGEL PI IND STRL 8 (GLOVE) ×1 IMPLANT
GOWN STRL REUS W/ TWL LRG LVL3 (GOWN DISPOSABLE) ×3 IMPLANT
GOWN STRL REUS W/TWL 2XL LVL3 (GOWN DISPOSABLE) ×2 IMPLANT
GUIDEWIRE ANGLED .035X150CM (WIRE) IMPLANT
HEMOSTAT HEMOBLAST BELLOWS (HEMOSTASIS) IMPLANT
HEMOSTAT SNOW SURGICEL 2X4 (HEMOSTASIS) IMPLANT
INSERT FOGARTY SM (MISCELLANEOUS) IMPLANT
KIT BASIN OR (CUSTOM PROCEDURE TRAY) ×1 IMPLANT
KIT TURNOVER KIT B (KITS) ×1 IMPLANT
NDL PERC 18GX7CM (NEEDLE) ×1 IMPLANT
NEEDLE PERC 18GX7CM (NEEDLE) ×1 IMPLANT
NS IRRIG 1000ML POUR BTL (IV SOLUTION) ×2 IMPLANT
PACK PERIPHERAL VASCULAR (CUSTOM PROCEDURE TRAY) ×1 IMPLANT
PACK SRG BSC III STRL LF ECLPS (CUSTOM PROCEDURE TRAY) ×1 IMPLANT
PAD ARMBOARD 7.5X6 YLW CONV (MISCELLANEOUS) ×2 IMPLANT
PROTECTION STATION PRESSURIZED (MISCELLANEOUS) ×1 IMPLANT
SET MICROPUNCTURE 5F STIFF (MISCELLANEOUS) ×1 IMPLANT
SHEATH AVANTI 11CM 5FR (SHEATH) ×1 IMPLANT
SHEATH PINNACLE 5F 10CM (SHEATH) IMPLANT
STAPLER SKIN PROX WIDE 3.9 (STAPLE) IMPLANT
STATION PROTECTION PRESSURIZED (MISCELLANEOUS) ×1 IMPLANT
STOPCOCK 4 WAY LG BORE MALE ST (IV SETS) IMPLANT
STOPCOCK MORSE 400PSI 3WAY (MISCELLANEOUS) IMPLANT
SURGIFLO W/THROMBIN 8M KIT (HEMOSTASIS) IMPLANT
SUT ETHILON 3 0 PS 1 (SUTURE) IMPLANT
SUT MNCRL AB 4-0 PS2 18 (SUTURE) ×3 IMPLANT
SUT PROLENE 5 0 C 1 24 (SUTURE) ×1 IMPLANT
SUT PROLENE 6 0 BV (SUTURE) ×1 IMPLANT
SUT PROLENE 7 0 BV 1 (SUTURE) IMPLANT
SUT SILK 2 0 PERMA HAND 18 BK (SUTURE) IMPLANT
SUT SILK 3-0 18XBRD TIE 12 (SUTURE) IMPLANT
SUT VIC AB 2-0 CT1 TAPERPNT 27 (SUTURE) ×2 IMPLANT
SUT VIC AB 3-0 SH 27X BRD (SUTURE) ×3 IMPLANT
SYR 10ML LL (SYRINGE) ×3 IMPLANT
SYR 20ML LL LF (SYRINGE) ×1 IMPLANT
SYR MEDRAD MARK V 150ML (SYRINGE) IMPLANT
TAPE UMBILICAL 1/8X30 (MISCELLANEOUS) IMPLANT
TOWEL GREEN STERILE (TOWEL DISPOSABLE) ×2 IMPLANT
TRAY FOLEY MTR SLVR 16FR STAT (SET/KITS/TRAYS/PACK) ×1 IMPLANT
TUBING EXTENTION W/L.L. (IV SETS) IMPLANT
TUBING HIGH PRESSURE 120CM (CONNECTOR) IMPLANT
UNDERPAD 30X36 HEAVY ABSORB (UNDERPADS AND DIAPERS) ×1 IMPLANT
WATER STERILE IRR 1000ML POUR (IV SOLUTION) ×1 IMPLANT
WIRE BENTSON .035X145CM (WIRE) ×1 IMPLANT

## 2023-03-23 SURGICAL SUPPLY — 63 items
APPLIER CLIP 11 MED OPEN (CLIP) ×3 IMPLANT
APPLIER CLIP 9.375 SM OPEN (CLIP) ×6 IMPLANT
BAG COUNTER SPONGE SURGICOUNT (BAG) ×3 IMPLANT
BANDAGE ESMARK 6X9 LF (GAUZE/BANDAGES/DRESSINGS) IMPLANT
BLADE CLIPPER SURG (BLADE) ×3 IMPLANT
BNDG ELASTIC 4INX 5YD STR LF (GAUZE/BANDAGES/DRESSINGS) IMPLANT
BNDG ELASTIC 6INX 5YD STR LF (GAUZE/BANDAGES/DRESSINGS) IMPLANT
BNDG ESMARK 6X9 LF (GAUZE/BANDAGES/DRESSINGS) ×3 IMPLANT
CANISTER SUCT 3000ML PPV (MISCELLANEOUS) ×3 IMPLANT
CATH EMB 2FR 60 (CATHETERS) IMPLANT
CLIP APPLIE 11 MED OPEN (CLIP) IMPLANT
CLIP APPLIE 9.375 SM OPEN (CLIP) ×3 IMPLANT
CLIP FOGARTY SPRING 6M (CLIP) IMPLANT
CLIP TI MEDIUM 24 (CLIP) ×3 IMPLANT
CLIP TI MEDIUM 6 (CLIP) ×3 IMPLANT
CLIP TI WIDE RED SMALL 24 (CLIP) ×3 IMPLANT
CLIP TI WIDE RED SMALL 6 (CLIP) ×3 IMPLANT
COVER PROBE W GEL 5X96 (DRAPES) ×3 IMPLANT
CUFF TOURN SGL QUICK 42 (TOURNIQUET CUFF) IMPLANT
CUFF TRNQT CYL 24X4X16.5-23 (TOURNIQUET CUFF) IMPLANT
CUFF TRNQT CYL 34X4.125X (TOURNIQUET CUFF) IMPLANT
DERMABOND ADVANCED .7 DNX12 (GAUZE/BANDAGES/DRESSINGS) ×3 IMPLANT
DRAIN CHANNEL 15F RND FF W/TCR (WOUND CARE) IMPLANT
DRAPE C-ARM 42X72 X-RAY (DRAPES) IMPLANT
DRESSING PEEL AND PLC PRVNA 13 (GAUZE/BANDAGES/DRESSINGS) IMPLANT
DRSG OPSITE POSTOP 4X8 (GAUZE/BANDAGES/DRESSINGS) IMPLANT
DRSG PEEL AND PLACE PREVENA 13 (GAUZE/BANDAGES/DRESSINGS) ×3 IMPLANT
ELECT REM PT RETURN 9FT ADLT (ELECTROSURGICAL) ×3 IMPLANT
ELECTRODE REM PT RTRN 9FT ADLT (ELECTROSURGICAL) ×3 IMPLANT
EVACUATOR SILICONE 100CC (DRAIN) IMPLANT
GLOVE BIOGEL PI IND STRL 8 (GLOVE) ×3 IMPLANT
GOWN STRL REUS W/ TWL LRG LVL3 (GOWN DISPOSABLE) ×9 IMPLANT
GOWN STRL REUS W/TWL 2XL LVL3 (GOWN DISPOSABLE) ×6 IMPLANT
HEMOSTAT SNOW SURGICEL 2X4 (HEMOSTASIS) IMPLANT
INSERT FOGARTY SM (MISCELLANEOUS) IMPLANT
KIT BASIN OR (CUSTOM PROCEDURE TRAY) ×3 IMPLANT
KIT DRSG PREVENA PLUS 7DAY 125 (MISCELLANEOUS) IMPLANT
KIT TURNOVER KIT B (KITS) ×3 IMPLANT
LOOP VESSEL MINI RED (MISCELLANEOUS) IMPLANT
NS IRRIG 1000ML POUR BTL (IV SOLUTION) ×6 IMPLANT
PACK PERIPHERAL VASCULAR (CUSTOM PROCEDURE TRAY) ×3 IMPLANT
PAD ARMBOARD 7.5X6 YLW CONV (MISCELLANEOUS) ×6 IMPLANT
SET MICROPUNCTURE 5F STIFF (MISCELLANEOUS) IMPLANT
SPONGE T-LAP 18X18 ~~LOC~~+RFID (SPONGE) IMPLANT
STAPLER SKIN PROX WIDE 3.9 (STAPLE) IMPLANT
STOPCOCK 4 WAY LG BORE MALE ST (IV SETS) IMPLANT
SURGIFLO W/THROMBIN 8M KIT (HEMOSTASIS) IMPLANT
SUT ETHILON 3 0 PS 1 (SUTURE) IMPLANT
SUT MNCRL AB 4-0 PS2 18 (SUTURE) ×9 IMPLANT
SUT PROLENE 5 0 C 1 24 (SUTURE) ×3 IMPLANT
SUT PROLENE 6 0 BV (SUTURE) ×3 IMPLANT
SUT PROLENE 7 0 BV 1 (SUTURE) IMPLANT
SUT SILK 2 0 PERMA HAND 18 BK (SUTURE) IMPLANT
SUT SILK 2 0 SH (SUTURE) IMPLANT
SUT SILK 3-0 18XBRD TIE 12 (SUTURE) IMPLANT
SUT VIC AB 2-0 CT1 TAPERPNT 27 (SUTURE) ×6 IMPLANT
SUT VIC AB 3-0 SH 27X BRD (SUTURE) ×9 IMPLANT
TAPE UMBILICAL 1/8X30 (MISCELLANEOUS) IMPLANT
TOWEL GREEN STERILE (TOWEL DISPOSABLE) ×3 IMPLANT
TRAY FOLEY MTR SLVR 16FR STAT (SET/KITS/TRAYS/PACK) ×3 IMPLANT
TUBING EXTENTION W/L.L. (IV SETS) IMPLANT
UNDERPAD 30X36 HEAVY ABSORB (UNDERPADS AND DIAPERS) ×3 IMPLANT
WATER STERILE IRR 1000ML POUR (IV SOLUTION) ×3 IMPLANT

## 2023-03-23 NOTE — Anesthesia Postprocedure Evaluation (Signed)
 Anesthesia Post Note  Patient: ODALIZ MCQUEARY  Procedure(s) Performed: LOWER EXTREMITY ANGIOGRAM (Left) BYPASS REVISION (Left)     Patient location during evaluation: PACU Anesthesia Type: General Level of consciousness: awake and alert Pain management: pain level controlled Vital Signs Assessment: post-procedure vital signs reviewed and stable Respiratory status: spontaneous breathing, nonlabored ventilation, respiratory function stable and patient connected to nasal cannula oxygen Cardiovascular status: blood pressure returned to baseline and stable Postop Assessment: no apparent nausea or vomiting Anesthetic complications: no  No notable events documented.  Last Vitals:  Vitals:   03/23/23 2130 03/23/23 2200  BP: 116/65 98/66  Pulse: 87 88  Resp: 12 13  Temp: 37.2 C   SpO2: 97% 100%    Last Pain:  Vitals:   03/23/23 2130  TempSrc:   PainSc: 0-No pain                 Shelton Silvas

## 2023-03-23 NOTE — Op Note (Signed)
 NAME: Gabrielle [name]    MRN: [id number] DOB: [birth year]    DATE OF OPERATION: 03/23/2023  PREOP DIAGNOSIS:    Concern for acute limb ischemia of the left lower extremity  POSTOP DIAGNOSIS:    Same  PROCEDURE:    Reexploration of the below-knee popliteal artery, tibioperoneal trunk, peroneal artery Reexploration of the left common femoral artery, proximal bypass Ultrasound-guided micropuncture access of the right common femoral artery in retrograde fashion Aortogram Second-order cannulation, left lower extremity angiogram Left-sided common femoral artery patch takedown, profunda thrombectomy, patch reanastomosis Left-sided distal anastomotic takedown, arteriotomy extension into the peroneal artery, Tibioperoneal trunk endarterectomy   SURGEON: Victorino Sparrow  ASSIST: Coral Else, MD, Nathanial Rancher, [place]  ANESTHESIA: General  EBL: 250 mL  INDICATIONS:    Gabrielle [name] is a 68 y.o. female who initially presented for left lower extremity critical limb ischemia with tissue loss of the great toe and underwent left-sided iliofemoral endarterectomy, greater saphenous vein patch plasty, femoral to tibioperoneal trunk bypass with reverse greater saphenous vein.  The case well without complications, however postoperatively very poor signals were noted in the foot.  I elected to take her back to the operating room for exploration.  After discussing the risks and benefits with both Tammala and her husband, they elected to proceed.  FINDINGS:   Widely patent common femoral artery Profunda branches were occluded, and upon thrombectomy, the clot appeared organized, not acute.  No dissection, no atherosclerotic disease appreciated that would lead to in situ thrombosis. Patent femoral to tibioperoneal trunk bypass with filling of the popliteal artery retrograde, anterior tibial artery, peroneal artery.  The distal tibioperoneal trunk, proximal peroneal artery demonstrated a  flow-limiting lesion below the level of the anastomosis. Prevena VAC left groin  TECHNIQUE:   Patient was brought to the OR laid in supine position.  General anesthesia was induced and patient was prepped and draped in standard fashion.  The patient was heparinized to an ACT greater than 250.  The case began with exploration of the below-knee popliteal artery incision.  Staples and suture were removed.  Upon examination of the bypass at the tibioperoneal trunk.  The bypass was patent and palpable.  There was flow distally.  I used an ultrasound to insonate the bypass.  There was no thrombus appreciated.  I then moved to the groin.  Upon opening the groin, the bypass was again palpable.  I elected to shoot an angiogram in an effort to better assess the reason for poor signals in the foot, assuming that it was an outflow issue.  Aortogram demonstrated widely patent inflow.  20% lesion in the left external iliac artery.  Interestingly, both branches that were previously endarterectomized and thrombectomized at her index operation earlier in the day, were occluded.  The bypass remained patent.  As noted above, there was outflow via the peroneal artery, and atretic PT artery and retrograde filling of the native tibioperoneal trunk to the anterior tibial artery which also filled.  These findings were very surprising.  I elected to open the endarterectomy patch from its lateral suture line.  From this position, thrombus was removed from the large and small profunda branches.  Interestingly, upon removal, the thrombus appeared organized.  This did not appear to be acute clot from an hour earlier.  I had my colleague, Dr. Myra Gianotti, and to assess the clot as well.  He also felt that this clot appeared organized and was not acute.  He assisted with the thrombectomy,  reselling of the patch to the common femoral artery.  Next, I elected to move to the below-knee popliteal artery.  There was some redundancy in the graft  which I chose to use in hopes that I could make a long splaying patch and address the more distal tibioperoneal trunk lesion.  The distal bypass anastomosis was taken down.  This was carried for another 3cm.  My endpoint demonstrated no issues, it was a centimeter below that and atherosclerotic plaque was appreciated.  Interestingly, the plaque did not appear to be flow-limiting, and there was excellent backbleeding from the peroneal artery, which measured 2.5 mm.  I elected to endarterectomized this area as I did think that the plaque would interfere with the distal anastomosis.  I splayed the distal portion of the bypass graft further, and so the bypass graft end-to-side fashion to the tibioperoneal trunk with the toe on the peroneal artery.  I ensured that the graft was not under significant tension.   Upon completion, I again performed a left lower extremity angiogram.  This demonstrated patent profunda branches from previous thrombectomy.  Distally, the peroneal artery fills nicely as well as the anterior tibial artery.  This provided outflow to the foot.  Single exam demonstrated a multiphasic peroneal signal.  I was happy with this.  I did chose not to reverse the heparin as I was unsure as to why the initial event occurred with both profunda branches.  Wound beds were irrigated with copious amounts of saline and closed in layers using Vicryl suture with staples at the level of the skin.  A Prevena VAC was placed in the left groin.  Patient was taken the PACU in stable condition with multiphasic peroneal signal.  Leg was wrapped in a light Ace wrap. 500 units of heparin will be started later this evening.  Victorino Sparrow, MD [facility] DATE OF DICTATION:   03/23/2023

## 2023-03-23 NOTE — Anesthesia Preprocedure Evaluation (Addendum)
 Anesthesia Evaluation  Patient identified by MRN, date of birth, ID band Patient awake    Reviewed: Allergy & Precautions, NPO status , Patient's Chart, lab work & pertinent test results  History of Anesthesia Complications Negative for: history of anesthetic complications  Airway Mallampati: II  TM Distance: >3 FB Neck ROM: Full    Dental  (+) Dental Advisory Given, Loose,    Pulmonary asthma , sleep apnea and Oxygen sleep apnea , former smoker  Hx lobectomy    Pulmonary exam normal        Cardiovascular hypertension, Pt. on medications and Pt. on home beta blockers + Peripheral Vascular Disease  Normal cardiovascular exam   '25 TTE - EF 65 to 70%. Grade I diastolic dysfunction (impaired relaxation). Trivial mitral valve regurgitation.     Neuro/Psych  PSYCHIATRIC DISORDERS Anxiety Depression    negative neurological ROS     GI/Hepatic ,GERD  Medicated and Controlled,,(+)     substance abuse    Endo/Other  negative endocrine ROS    Renal/GU negative Renal ROS     Musculoskeletal  (+) Arthritis ,    Abdominal   Peds  Hematology  (+) Blood dyscrasia, anemia   Anesthesia Other Findings Legally blind Hx eating d/o   Reproductive/Obstetrics                              Anesthesia Physical Anesthesia Plan  ASA: 4 and emergent  Anesthesia Plan: General   Post-op Pain Management: Tylenol PO (pre-op)*   Induction: Intravenous  PONV Risk Score and Plan: 3 and Treatment may vary due to age or medical condition, Ondansetron, Dexamethasone and Midazolam  Airway Management Planned: Oral ETT  Additional Equipment: Arterial line  Intra-op Plan:   Post-operative Plan: Extubation in OR  Informed Consent: I have reviewed the patients History and Physical, chart, labs and discussed the procedure including the risks, benefits and alternatives for the proposed anesthesia with the  patient or authorized representative who has indicated his/her understanding and acceptance.     Dental advisory given  Plan Discussed with: CRNA and Anesthesiologist  Anesthesia Plan Comments: (PAT note by Antionette Poles, PA-C)         Anesthesia Quick Evaluation

## 2023-03-23 NOTE — Progress Notes (Addendum)
 Patient seen in PACU, signal not present.  Leg checked, while asymptomatic, there is a severely depressed signal.  This is not the same signal that was present in the operating room, or immediately postop.  Plan is to take her back to the operating room, open the incisions and assess both anastomoses as well as the bypass graft.  Will perform diagnostic angiography as well with likely bypass revision.  I discussed this with the patient, as well as her husband.  Being that she was medicated her husband gave verbal consent for bypass revision.  Will move to the OR in expedited fashion.  Victorino Sparrow MD

## 2023-03-23 NOTE — Anesthesia Procedure Notes (Signed)
 Central Venous Catheter Insertion Performed by: Marcene Duos, MD, anesthesiologist Start/End2/28/2025 5:10 PM, 03/23/2023 5:20 PM Patient location: Pre-op. Preanesthetic checklist: patient identified, IV checked, site marked, risks and benefits discussed, surgical consent, monitors and equipment checked, pre-op evaluation, timeout performed and anesthesia consent Position: Trendelenburg Lidocaine 1% used for infiltration and patient sedated Hand hygiene performed , maximum sterile barriers used  and Seldinger technique used Catheter size: 8 Fr Total catheter length 16. Central line was placed.Double lumen Procedure performed using ultrasound guided technique. Ultrasound Notes:anatomy identified, needle tip was noted to be adjacent to the nerve/plexus identified, no ultrasound evidence of intravascular and/or intraneural injection and image(s) printed for medical record Attempts: 1 Following insertion, dressing applied, line sutured and Biopatch. Post procedure assessment: blood return through all ports  Patient tolerated the procedure well with no immediate complications.

## 2023-03-23 NOTE — Transfer of Care (Signed)
 Immediate Anesthesia Transfer of Care Note  Patient: Gabrielle [name]  Procedure(s) Performed: ENDARTERECTOMY LEFT FEMORAL (Left: Groin) BYPASS GRAFT LEFT FEMORAL-PERONEAL ARTERY (Left: Leg Upper) GREATER SAPHENOUS VEIN HARVEST (Left: Leg Upper) VEIIN PATCH ANGIOPLASTY (Groin)  Patient Location: PACU  Anesthesia Type:General  Level of Consciousness: awake, alert , and oriented  Airway & Oxygen Therapy: Patient Spontanous Breathing  Post-op Assessment: Report given to RN  Post vital signs: Reviewed and stable  Last Vitals:  Vitals Value Taken Time  BP 119/54 03/23/23 1540  Temp    Pulse 95 03/23/23 1543  Resp 18 03/23/23 1543  SpO2 100 % 03/23/23 1543  Vitals shown include unfiled device data.  Last Pain:  Vitals:   03/23/23 0758  TempSrc:   PainSc: 4       Patients Stated Pain Goal: 0 (03/23/23 0758)  Complications: No notable events documented.

## 2023-03-23 NOTE — Transfer of Care (Signed)
 Immediate Anesthesia Transfer of Care Note  Patient: Gabrielle [name]  Procedure(s) Performed: LOWER EXTREMITY ANGIOGRAM (Left) BYPASS REVISION (Left)  Patient Location: PACU  Anesthesia Type:General  Level of Consciousness: awake, alert , and oriented  Airway & Oxygen Therapy: Patient Spontanous Breathing and Patient connected to face mask oxygen  Post-op Assessment: Report given to RN and Post -op Vital signs reviewed and stable  Post vital signs: Reviewed and stable  Last Vitals:  Vitals Value Taken Time  BP 135/93 03/23/23 2055  Temp    Pulse 93 03/23/23 2055  Resp 11 03/23/23 2055  SpO2 100 % 03/23/23 2055  Vitals shown include unfiled device data.  Last Pain:  Vitals:   03/23/23 1540  TempSrc:   PainSc: 0-No pain      Patients Stated Pain Goal: 0 (03/23/23 0758)  Complications: No notable events documented.

## 2023-03-23 NOTE — Anesthesia Procedure Notes (Signed)
 Procedure Name: Intubation Date/Time: 03/23/2023 5:15 PM  Performed by: Alwyn Ren, CRNAPre-anesthesia Checklist: Patient identified, Emergency Drugs available, Suction available and Patient being monitored Patient Re-evaluated:Patient Re-evaluated prior to induction Oxygen Delivery Method: Circle system utilized Preoxygenation: Pre-oxygenation with 100% oxygen Induction Type: IV induction Ventilation: Mask ventilation without difficulty Laryngoscope Size: Miller and 2 Grade View: Grade II Tube type: Oral Tube size: 7.0 mm Number of attempts: 2 Airway Equipment and Method: Stylet and Oral airway Placement Confirmation: ETT inserted through vocal cords under direct vision, positive ETCO2 and breath sounds checked- equal and bilateral Secured at: 22 cm Tube secured with: Tape Dental Injury: Teeth and Oropharynx as per pre-operative assessment  Comments: Intubated second attempt by Dr. Sampson Goon

## 2023-03-23 NOTE — Anesthesia Procedure Notes (Signed)
 Arterial Line Insertion Start/End2/28/2025 9:00 AM, 03/23/2023 9:15 AM Performed by: Beryle Lathe, MD, Susy Manor, CRNA, CRNA  Patient location: Pre-op. Preanesthetic checklist: patient identified, IV checked, site marked, risks and benefits discussed, surgical consent, monitors and equipment checked, pre-op evaluation, timeout performed and anesthesia consent Lidocaine 1% used for infiltration Right, radial was placed Catheter size: 20 G Hand hygiene performed  and maximum sterile barriers used   Attempts: 1 Procedure performed without using ultrasound guided technique. Following insertion, dressing applied. Post procedure assessment: normal and unchanged  Patient tolerated the procedure well with no immediate complications.

## 2023-03-23 NOTE — Op Note (Signed)
 NAME: Gabrielle [name]    MRN: [id number] DOB: Jan 13, 1956    DATE OF OPERATION: 03/23/2023  PREOP DIAGNOSIS:    Left lower extremity critical limb ischemia with tissue loss of the great toe  POSTOP DIAGNOSIS:    Same  PROCEDURE:    Left iliofemoral endarterectomy, embolectomy, extended onto the profunda with greater saphenous vein patch Left greater saphenous vein harvest Left femoral to tibioperoneal trunk bypass using reverse greater saphenous vein  SURGEON: Victorino Sparrow  ASSIST: Emilie Rutter, PA  ANESTHESIA: General  EBL: 150 mL  INDICATIONS:    Gabrielle [name] is a 68 y.o. female who presented my office with a 71-month history of left-sided rest pain, now with tissue loss. ABIs were severely diminished.  She was taken to the Cath Lab with diagnostic angiography demonstrating occluded left common femoral artery, profunda, superficial femoral artery with reconstitution of the below-knee popliteal artery with single-vessel peroneal artery runoff.  After discussing the risks and benefits of open left sided iliofemoral endarterectomy, embolectomy with femoral to below-knee popliteal artery bypass, Gabrielle [name] elected to proceed.  FINDINGS:   Occluded left common femoral artery with severe atherosclerotic disease as well as chronic thrombus.  Occluded profunda.  More distally, there appeared to be another profunda branch prior to the takeoff of the SFA. Greater saphenous vein 4 mm - 3 mm tapering Left below-knee popliteal artery with severe atherosclerotic disease, tibioperoneal trunk with moderate disease, patent with 2.5-3 mm lumen   TECHNIQUE:   Patient was brought to the OR laid in supine position.  General anesthesia was induced and the patient was prepped and draped in standard fashion.  The case began with ultrasound insonation of the left greater saphenous vein.  The vein was marked, and 4 skip incisions were made along the left leg in an effort to harvest the  left greater saphenous vein.  There were several branches.  The vein appeared to be roughly 3 to 4 mm in size.  Branches were ligated with 3-0 silk suture, and the entirety of the greater saphenous vein was harvested from the groin to the mid calf.  At the proximal portion of the greater saphenous vein, at the saphenofemoral junction, there was also another large branch that was harvested as well, which I was open to use for endarterectomy patch.  Once harvested, an oblique incision was made in the left groin, this carried down to the common femoral artery.  The distal external iliac artery, common femoral artery, 2 large profunda branches, and the SFA were all exposed.  Next, I moved to expose the below-knee popliteal artery in standard fashion.  This was exposed, however on palpation, the artery had severe calcific disease.  I used a Doppler to insonate the artery, there was no signal.  I moved distally.  I ligated the anterior tibial vein, and moved to expose the tibioperoneal trunk.  Upon insonation, there was a signal.  I was worried that with the length of vein that I had, I would not be able to go any further distal for bypass using greater saphenous vein.  At this point, an 8 mm tunneler was brought to the field and tunneled from between the condyles to the left common femoral artery.  This was left in place.  The patient was heparinized to an ACT greater than 250, the artery and branches were clamped, and a longitudinal arteriotomy made.  Upon opening the artery, chronic thrombus was appreciated, and the common femoral artery was occluded.  Extensive endarterectomy followed which extended onto one of the 2 profunda branches appreciated.  The distal profunda branch, had poor backbleeding, therefore #2 embolectomy catheter was brought to the field and run through the profunda.  While there was no return of clots, there was significant improvement in backbleeding.  There was excellent inflow.  I then used  the greater saphenous vein branch as a patch.  This was sewn in standard fashion using running 6-0 Prolene suture.  Next, clamps were released, and the patch checked for bleeding.  There was nice hemostasis.  Excellent signals in the 2 profunda branches.  I then moved to so in the femoral portion of the bypass graft.  The greater saphenous vein was prepped in standard fashion, and I elected to reverse the vein as the size match from proximal and distal was similar.  The patch was opened using an 11 blade, and the vein was spatulated.  It was sewn in end-to-side fashion using running 6-0 Prolene suture.  The vein was then pressurized, marked to ensure there was no twisting, and pulled through the tunnel.  The leg was pulled to length, vein pressurized, and marked at the site of anastomosis.  Next, a tourniquet was brought onto the field and an Esmarch wrap was placed on the lower extremity.  The tourniquet was then inflated.  The tibioperoneal trunk was then opened through a longitudinal arteriotomy.  While there was disease, there was backbleeding from both the anterior tibial artery and tibioperoneal trunk.  I elected to sew at this location.  #3 dilator passed nicely distally.  The vein was spatulated and sewn inside fashion using running 6-0 Prolene suture without issue. Upon completion, there was an excellent pulse distally.  There were signals in the anterior tibial, peroneal and posterior tibial arteries, these augmented appropriately with bypass occlusion.  Heparin was reversed, and hemostasis achieved with use of protamine, thrombin product.  The groin and below-knee popliteal site were closed using layers of Vicryl with staples at the level of the skin.  The saphenectomy sites in the thigh were closed using layers of Vicryl with Monocryl and Dermabond.  The patient was taken to PACU in stable condition.    Victorino Sparrow, MD [facility] DATE OF DICTATION:    03/23/2023
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
## 2023-03-24 LAB — CBC
HCT: 24.7 % — ABNORMAL LOW (ref 36.0–46.0)
Hemoglobin: 8.5 g/dL — ABNORMAL LOW (ref 12.0–15.0)
MCH: 33.7 pg (ref 26.0–34.0)
MCHC: 34.4 g/dL (ref 30.0–36.0)
MCV: 98 fL (ref 80.0–100.0)
Platelets: 144 10*3/uL — ABNORMAL LOW (ref 150–400)
RBC: 2.52 MIL/uL — ABNORMAL LOW (ref 3.87–5.11)
RDW: 18.5 % — ABNORMAL HIGH (ref 11.5–15.5)
WBC: 9.3 10*3/uL (ref 4.0–10.5)
nRBC: 0 % (ref 0.0–0.2)

## 2023-03-24 LAB — HEMOGLOBIN AND HEMATOCRIT, BLOOD
HCT: 25.2 % — ABNORMAL LOW (ref 36.0–46.0)
Hemoglobin: 8.6 g/dL — ABNORMAL LOW (ref 12.0–15.0)

## 2023-03-24 LAB — TROPONIN I (HIGH SENSITIVITY)
Troponin I (High Sensitivity): 11 ng/L (ref <18)
Troponin I (High Sensitivity): 12 ng/L (ref <18)

## 2023-03-24 LAB — LIPID PANEL
Cholesterol: 101 mg/dL (ref 0–200)
HDL: 46 mg/dL (ref >40)
LDL Cholesterol: 32 mg/dL (ref 0–99)
Total CHOL/HDL Ratio: 2.2 ratio
Triglycerides: 115 mg/dL (ref <150)
VLDL: 23 mg/dL (ref 0–40)

## 2023-03-24 LAB — BASIC METABOLIC PANEL
Anion gap: 11 (ref 5–15)
BUN: 6 mg/dL — ABNORMAL LOW (ref 8–23)
CO2: 22 mmol/L (ref 22–32)
Calcium: 7.1 mg/dL — ABNORMAL LOW (ref 8.9–10.3)
Chloride: 105 mmol/L (ref 98–111)
Creatinine, Ser: 0.73 mg/dL (ref 0.44–1.00)
GFR, Estimated: >60 mL/min (ref >60)
Glucose, Bld: 115 mg/dL — ABNORMAL HIGH (ref 70–99)
Potassium: 3.7 mmol/L (ref 3.5–5.1)
Sodium: 138 mmol/L (ref 135–145)

## 2023-03-24 LAB — CREATININE, SERUM
Creatinine, Ser: 0.75 mg/dL (ref 0.44–1.00)
GFR, Estimated: >60 mL/min (ref >60)

## 2023-03-24 LAB — CK: Total CK: 264 U/L — ABNORMAL HIGH (ref 38–234)

## 2023-03-24 LAB — PREPARE RBC (CROSSMATCH)

## 2023-03-24 LAB — GLUCOSE, CAPILLARY: Glucose-Capillary: 139 mg/dL — ABNORMAL HIGH (ref 70–99)

## 2023-03-24 MED ORDER — FLUTICASONE FUROATE-VILANTEROL 200-25 MCG/ACT IN AEPB
1.0000 | INHALATION_SPRAY | Freq: Every day | RESPIRATORY_TRACT | Status: DC
  Administered 2023-03-24 – 2023-03-29 (×6): 1 via RESPIRATORY_TRACT
  Filled 2023-03-24 (×2): qty 28

## 2023-03-24 MED ORDER — NOREPINEPHRINE 4 MG/250ML-% IV SOLN: 0.0000 ug/min | INTRAVENOUS | Status: DC

## 2023-03-24 MED ORDER — PIPERACILLIN-TAZOBACTAM 3.375 G IVPB
3.3750 g | Freq: Three times a day (TID) | INTRAVENOUS | Status: AC
  Administered 2023-03-24 – 2023-03-29 (×15): 3.375 g via INTRAVENOUS
  Filled 2023-03-24 (×15): qty 50

## 2023-03-24 MED ORDER — ORAL CARE MOUTH RINSE
15.0000 mL | OROMUCOSAL | Status: DC
Start: 2023-03-24 — End: 2023-03-26
  Administered 2023-03-25 – 2023-03-26 (×6): 15 mL via OROMUCOSAL

## 2023-03-24 MED ORDER — NOREPINEPHRINE 4 MG/250ML-% IV SOLN
INTRAVENOUS | Status: AC
  Administered 2023-03-24: 2 ug/min via INTRAVENOUS
  Filled 2023-03-24: qty 250

## 2023-03-24 MED ORDER — ORAL CARE MOUTH RINSE: 15.0000 mL | OROMUCOSAL | Status: DC | PRN

## 2023-03-24 MED ORDER — LACTATED RINGERS IV BOLUS
1000.0000 mL | Freq: Once | INTRAVENOUS | Status: AC
  Administered 2023-03-24: 1000 mL via INTRAVENOUS

## 2023-03-24 MED ORDER — SODIUM CHLORIDE 0.9 % IV BOLUS
1000.0000 mL | Freq: Once | INTRAVENOUS | Status: AC
  Administered 2023-03-24: 1000 mL via INTRAVENOUS

## 2023-03-24 MED ORDER — SODIUM BICARBONATE 650 MG PO TABS
650.0000 mg | ORAL_TABLET | Freq: Two times a day (BID) | ORAL | Status: DC
  Administered 2023-03-24: 650 mg via ORAL
  Filled 2023-03-24: qty 1

## 2023-03-24 MED ORDER — SODIUM CHLORIDE 0.9% IV SOLUTION: Freq: Once | INTRAVENOUS | Status: AC

## 2023-03-24 NOTE — Progress Notes (Addendum)
    Subjective  - POD # 1, status post left iliofemoral endarterectomy with saphenous vein tibioperoneal trunk bypass requiring takeback for thrombectomy and revision of distal anastomosis  Complaining of leg pain this morning   Physical Exam:  Excellent pedal Doppler signals Dressings are clean and dry    Assessment/Plan:  POD #1  -Patient will stay on heparin.  Unclear as to the etiology of her thrombosis.  Potentially this could be from a thromboembolic event or it could be from her distal anastomosis which was revised. -Hypotension/acute blood loss anemia: The patient required an additional fluid bolus overnight.  She is not on pressors.  I will give her a unit of blood to see if we can get her blood pressure up. -Encouraged ambulation in the hallways -Patient is on Zosyn for wound -Diet as tolerated -Creatinine 0.73 this morning, good urine output -Protonix for GI prophylaxis   Wells Linell Meldrum 03/24/2023 10:51 AM --  Vitals:   03/24/23 1000 03/24/23 1015  BP: 102/60 105/62  Pulse: 79 75  Resp: 18 15  Temp: 98.8 F (37.1 C) 98.7 F (37.1 C)  SpO2: 98% 97%    Intake/Output Summary (Last 24 hours) at 03/24/2023 1051 Last data filed at 03/24/2023 1000 Gross per 24 hour  Intake 6767.62 ml  Output 1710 ml  Net 5057.62 ml     Laboratory CBC    Component Value Date/Time   WBC 9.3 03/24/2023 0359   HGB 8.5 (L) 03/24/2023 0359   HCT 24.7 (L) 03/24/2023 0359   PLT 144 (L) 03/24/2023 0359    BMET    Component Value Date/Time   NA 138 03/24/2023 0359   K 3.7 03/24/2023 0359   CL 105 03/24/2023 0359   CO2 22 03/24/2023 0359   GLUCOSE 115 (H) 03/24/2023 0359   BUN 6 (L) 03/24/2023 0359   CREATININE 0.73 03/24/2023 0359   CREATININE 0.73 03/26/2020 1520   CALCIUM 7.1 (L) 03/24/2023 0359   GFRNONAA >60 03/24/2023 0359   GFRAA >60 06/26/2017 1043    COAG Lab Results  Component Value Date   INR 1.0 03/21/2023   INR 0.94 04/17/2014   INR 1.0 07/12/2007   No  results found for: "PTT"  Antibiotics Anti-infectives (From admission, onward)    Start     Dose/Rate Route Frequency Ordered Stop   03/24/23 1030  piperacillin-tazobactam (ZOSYN) IVPB 3.375 g        3.375 g 12.5 mL/hr over 240 Minutes Intravenous Every 8 hours 03/24/23 1027     03/23/23 0753  ceFAZolin (ANCEF) IVPB 2g/100 mL premix        2 g 200 mL/hr over 30 Minutes Intravenous 30 min pre-op 03/23/23 0753 03/23/23 1440        V. Charlena Cross, M.D., [facility] [place] Office: [phone] Pager:  [phone]

## 2023-03-24 NOTE — Anesthesia Postprocedure Evaluation (Signed)
 Anesthesia Post Note  Patient: Gabrielle [name]  Procedure(s) Performed: ENDARTERECTOMY LEFT FEMORAL (Left: Groin) BYPASS GRAFT LEFT FEMORAL-PERONEAL ARTERY (Left: Leg Upper) GREATER SAPHENOUS VEIN HARVEST (Left: Leg Upper) VEIIN PATCH ANGIOPLASTY (Groin)     Patient location during evaluation: PACU Anesthesia Type: General Level of consciousness: awake and alert Pain management: pain level controlled Vital Signs Assessment: post-procedure vital signs reviewed and stable Respiratory status: spontaneous breathing, nonlabored ventilation, respiratory function stable and patient connected to nasal cannula oxygen Cardiovascular status: blood pressure returned to baseline and stable Postop Assessment: no apparent nausea or vomiting Anesthetic complications: no   No notable events documented.  Last Vitals:  Vitals:   03/24/23 1100 03/24/23 1113  BP: 99/71   Pulse: 75 75  Resp: 14 18  Temp:    SpO2: 93% 99%    Last Pain:  Vitals:   03/24/23 1015  TempSrc: Oral  PainSc:                  Nelle Don Maxamilian Amadon

## 2023-03-24 NOTE — Plan of Care (Signed)

## 2023-03-24 NOTE — Progress Notes (Signed)
 PHARMACIST LIPID MONITORING   Gabrielle [name] is a 68 y.o. female admitted on 03/23/2023 with lower extremity critical limb ischemia.  Pharmacy has been consulted to optimize lipid-lowering therapy with the indication of secondary prevention for clinical ASCVD.  Recent Labs:  Lipid Panel (last 6 months):   Lab Results  Component Value Date   CHOL 101 03/24/2023   TRIG 115 03/24/2023   HDL 46 03/24/2023   CHOLHDL 2.2 03/24/2023   VLDL 23 03/24/2023   LDLCALC 32 03/24/2023    Hepatic function panel (last 6 months):   Lab Results  Component Value Date   AST 53 (H) 03/21/2023   ALT 43 03/21/2023   ALKPHOS 66 03/21/2023   BILITOT 0.6 03/21/2023    SCr (since admission):   Serum creatinine: 0.73 mg/dL 98/11/91 4782 Estimated creatinine clearance: 63.4 mL/min  Current therapy and lipid therapy tolerance Current lipid-lowering therapy: atorvastatin 40 mg  Previous lipid-lowering therapies (if applicable): pravastatin 40 mg, fenofibrate Documented or reported allergies or intolerances to lipid-lowering therapies (if applicable): None  Assessment:   No changes to regimen for now given LDL<55   Plan:    1.Statin intensity (high intensity recommended for all patients regardless of the LDL):  No statin changes. The patient is already on a high intensity statin.  2.Add ezetimibe (if any one of the following):   Not indicated at this time.  3.Refer to lipid clinic:   No  4.Follow-up with:  Primary care provider - Ignatius Specking, MD  5.Follow-up labs after discharge:  No changes in lipid therapy, repeat a lipid panel in one year.      Thank you for allowing pharmacy to participate in this patient's care,  Sherron Monday, PharmD, BCCCP Clinical Pharmacist  Phone: [phone] 03/24/2023 12:35 PM  Please check AMION for all [facility] Pharmacy phone numbers After 10:00 PM, call Main Pharmacy [phone]

## 2023-03-24 NOTE — Evaluation (Signed)
 Physical Therapy Evaluation Patient Details Name: Gabrielle [name] MRN: [id number] DOB: 1955-02-17 Today's Date: 03/24/2023  History of Present Illness  68 y.o. female admitted on 03/23/2023 with L lower extremity critical limb ischemia. S/p  left iliofemoral endarterectomy with saphenous vein tibioperoneal trunk bypass requiring takeback for thrombectomy and revision of distal anastomosis.  PMH: smoking (40 pack years, quit 2016), NSCLC s/p right upper lobectomy 2016 (did not require chemo or radiation), asthma, emphysema, nocturnal hypoxemia on 2 to 3 L O2 at night.HTN, GERD, breast cancer s/p right lumpectomy and SLNB 2014  Clinical Impression  PTA pt living with husband in single story home with 3 steps to enter Pt reports independence with short distance ambulation uses grocery cart for support in store. Pt independent with ADLS and iADLs. Pt is currently limited in safe mobility by low BP, L sided pain and resultant decreased ROM and strength. Pt requiring modA for bed mobility, and min A for STS and stand pivot transfer with RW. Pt will benefit from HHPT at discharge to progress her mobility. PT will continue to follow acutely         If plan is discharge home, recommend the following: A little help with walking and/or transfers;A little help with bathing/dressing/bathroom;Assistance with cooking/housework;Assist for transportation;Help with stairs or ramp for entrance;Supervision due to cognitive status   Can travel by private vehicle    Yes    Equipment Recommendations BSC/3in1 (tub bench)     Functional Status Assessment Patient has had a recent decline in their functional status and demonstrates the ability to make significant improvements in function in a reasonable and predictable amount of time.     Precautions / Restrictions Precautions Precautions: Fall Restrictions Weight Bearing Restrictions Per Provider Order: No      Mobility  Bed Mobility Overal bed mobility:  Needs Assistance Bed Mobility: Supine to Sit     Supine to sit: HOB elevated, Used rails, Mod assist     General bed mobility comments: pt able to bring LE to EoB, modA for bringing trunk to upright and pad scoot of hips to EOB    Transfers Overall transfer level: Needs assistance   Transfers: Sit to/from Stand, Bed to chair/wheelchair/BSC Sit to Stand: Min assist   Step pivot transfers: Min assist       General transfer comment: min A for power up from bed and for stepping transfer to chir on her L    Ambulation/Gait               General Gait Details: deferred due to decreased BP        Balance Overall balance assessment: Needs assistance Sitting-balance support: Feet unsupported, No upper extremity supported Sitting balance-Leahy Scale: Fair     Standing balance support: Bilateral upper extremity supported, During functional activity, Reliant on assistive device for balance Standing balance-Leahy Scale: Poor Standing balance comment: benefits from UE support with dynamic balance                             Pertinent Vitals/Pain Pain Assessment Pain Assessment: 0-10 Pain Score: 4  Pain Location: L LE Pain Descriptors / Indicators: Aching, Sore, Tender Pain Intervention(s): Limited activity within patient's tolerance, Monitored during session, Repositioned    Home Living Family/patient expects to be discharged to:: Private residence Living Arrangements: Spouse/significant other;Children Available Help at Discharge: Family;Available 24 hours/day Type of Home: House Home Access: Stairs to enter   [employer] of  Steps: 3   Home Layout: One level Home Equipment: Rolling Walker (2 wheels);Cane - single point      Prior Function Prior Level of Function : Independent/Modified Independent             Mobility Comments: L LE pain with walking but able to ambulate limited community distances. uses groery card for support ADLs  Comments: independentt     Extremity/Trunk Assessment   Upper Extremity Assessment Upper Extremity Assessment: Defer to OT evaluation    Lower Extremity Assessment Lower Extremity Assessment: LLE deficits/detail LLE Deficits / Details: AROM limited by stiffness and pain, functionally able to pick up L LE to take steps to recliner LLE Sensation: decreased light touch LLE Coordination: decreased fine motor    Cervical / Trunk Assessment Cervical / Trunk Assessment: Kyphotic  Communication   Communication Communication: No apparent difficulties    Cognition Arousal: Alert Behavior During Therapy: Flat affect   PT - Cognitive impairments: Problem solving                         Following commands: Impaired Following commands impaired: Follows multi-step commands with increased time     Cueing Cueing Techniques: Verbal cues, Tactile cues, Visual cues     General Comments General comments (skin integrity, edema, etc.): 102/52 supine, 94/47 sitting, with transfer to chair dropped to 74/47 over 30 min rebounded to 96/52        Assessment/Plan    PT Assessment Patient needs continued PT services  PT Problem List Decreased strength;Decreased range of motion;Decreased activity tolerance;Decreased balance;Decreased mobility;Cardiopulmonary status limiting activity;Pain       PT Treatment Interventions DME instruction;Gait training;Stair training;Functional mobility training;Therapeutic activities;Therapeutic exercise;Balance training    PT Goals (Current goals can be found in the Care Plan section)  Acute Rehab PT Goals PT Goal Formulation: With patient Time For Goal Achievement: 04/07/23 Potential to Achieve Goals: Fair    Frequency Min 1X/week        AM-PAC PT "6 Clicks" Mobility  Outcome Measure Help needed turning from your back to your side while in a flat bed without using bedrails?: A Little Help needed moving from lying on your back to sitting on  the side of a flat bed without using bedrails?: A Lot Help needed moving to and from a bed to a chair (including a wheelchair)?: A Little Help needed standing up from a chair using your arms (e.g., wheelchair or bedside chair)?: A Little Help needed to walk in hospital room?: A Lot Help needed climbing 3-5 steps with a railing? : A Lot 6 Click Score: 15    End of Session Equipment Utilized During Treatment: Gait belt Activity Tolerance: Patient limited by fatigue;Treatment limited secondary to medical complications (Comment) (lower BP) Patient left: in chair;with call bell/phone within reach;with chair alarm set Nurse Communication: Mobility status;Other (comment) (low BP levels) PT Visit Diagnosis: Muscle weakness (generalized) (M62.81);Difficulty in walking, not elsewhere classified (R26.2);Pain;Other abnormalities of gait and mobility (R26.89);Unsteadiness on feet (R26.81)    Time: 9629-5284 PT Time Calculation (min) (ACUTE ONLY): 49 min   Charges:   PT Evaluation $PT Eval Moderate Complexity: 1 Mod PT Treatments $Gait Training: 8-22 mins $Therapeutic Activity: 8-22 mins PT General Charges $$ ACUTE PT VISIT: 1 Visit         Uriyah Raska B. Beverely Risen PT, DPT Acute Rehabilitation Services Please use secure chat or  Call Office [phone]   Elon Alas [facility] 03/24/2023, 3:46 PM

## 2023-03-25 ENCOUNTER — Encounter (HOSPITAL_COMMUNITY): Payer: Self-pay | Admitting: Vascular Surgery

## 2023-03-25 LAB — TYPE AND SCREEN
ABO/RH(D): O POS
Antibody Screen: NEGATIVE
Unit division: 0
Unit division: 0

## 2023-03-25 LAB — POCT I-STAT 7, (LYTES, BLD GAS, ICA,H+H)
Acid-base deficit: 6 mmol/L — ABNORMAL HIGH (ref 0.0–2.0)
Acid-base deficit: 6 mmol/L — ABNORMAL HIGH (ref 0.0–2.0)
Acid-base deficit: 6 mmol/L — ABNORMAL HIGH (ref 0.0–2.0)
Acid-base deficit: 6 mmol/L — ABNORMAL HIGH (ref 0.0–2.0)
Acid-base deficit: 3 mmol/L — ABNORMAL HIGH (ref 0.0–2.0)
Acid-base deficit: 6 mmol/L — ABNORMAL HIGH (ref 0.0–2.0)
Bicarbonate: 21 mmol/L (ref 20.0–28.0)
Bicarbonate: 21.1 mmol/L (ref 20.0–28.0)
Bicarbonate: 19.6 mmol/L — ABNORMAL LOW (ref 20.0–28.0)
Bicarbonate: 20 mmol/L (ref 20.0–28.0)
Bicarbonate: 20.1 mmol/L (ref 20.0–28.0)
Bicarbonate: 22.4 mmol/L (ref 20.0–28.0)
Calcium, Ion: 1.02 mmol/L — ABNORMAL LOW (ref 1.15–1.40)
Calcium, Ion: 1.08 mmol/L — ABNORMAL LOW (ref 1.15–1.40)
Calcium, Ion: 1.01 mmol/L — ABNORMAL LOW (ref 1.15–1.40)
Calcium, Ion: 1.04 mmol/L — ABNORMAL LOW (ref 1.15–1.40)
Calcium, Ion: 1 mmol/L — ABNORMAL LOW (ref 1.15–1.40)
Calcium, Ion: 1.04 mmol/L — ABNORMAL LOW (ref 1.15–1.40)
HCT: 25 % — ABNORMAL LOW (ref 36.0–46.0)
HCT: 30 % — ABNORMAL LOW (ref 36.0–46.0)
HCT: 26 % — ABNORMAL LOW (ref 36.0–46.0)
HCT: 26 % — ABNORMAL LOW (ref 36.0–46.0)
HCT: 23 % — ABNORMAL LOW (ref 36.0–46.0)
HCT: 25 % — ABNORMAL LOW (ref 36.0–46.0)
Hemoglobin: 10.2 g/dL — ABNORMAL LOW (ref 12.0–15.0)
Hemoglobin: 8.5 g/dL — ABNORMAL LOW (ref 12.0–15.0)
Hemoglobin: 8.8 g/dL — ABNORMAL LOW (ref 12.0–15.0)
Hemoglobin: 8.8 g/dL — ABNORMAL LOW (ref 12.0–15.0)
Hemoglobin: 7.8 g/dL — ABNORMAL LOW (ref 12.0–15.0)
Hemoglobin: 8.5 g/dL — ABNORMAL LOW (ref 12.0–15.0)
O2 Saturation: 100 %
O2 Saturation: 96 %
O2 Saturation: 100 %
O2 Saturation: 92 %
O2 Saturation: 100 %
O2 Saturation: 92 %
Patient temperature: 36.8
Patient temperature: 36.2
Patient temperature: 36.4
Patient temperature: 36.3
Patient temperature: 36.4
Potassium: 3.3 mmol/L — ABNORMAL LOW (ref 3.5–5.1)
Potassium: 3.7 mmol/L (ref 3.5–5.1)
Potassium: 3.5 mmol/L (ref 3.5–5.1)
Potassium: 3.6 mmol/L (ref 3.5–5.1)
Potassium: 3.5 mmol/L (ref 3.5–5.1)
Potassium: 3.6 mmol/L (ref 3.5–5.1)
Sodium: 139 mmol/L (ref 135–145)
Sodium: 139 mmol/L (ref 135–145)
Sodium: 139 mmol/L (ref 135–145)
Sodium: 139 mmol/L (ref 135–145)
Sodium: 139 mmol/L (ref 135–145)
Sodium: 140 mmol/L (ref 135–145)
TCO2: 22 mmol/L (ref 22–32)
TCO2: 22 mmol/L (ref 22–32)
TCO2: 21 mmol/L — ABNORMAL LOW (ref 22–32)
TCO2: 21 mmol/L — ABNORMAL LOW (ref 22–32)
TCO2: 21 mmol/L — ABNORMAL LOW (ref 22–32)
TCO2: 24 mmol/L (ref 22–32)
pCO2 arterial: 46.6 mmHg (ref 32–48)
pCO2 arterial: 47.8 mmHg (ref 32–48)
pCO2 arterial: 36.3 mmHg (ref 32–48)
pCO2 arterial: 39.4 mmHg (ref 32–48)
pCO2 arterial: 37.7 mmHg (ref 32–48)
pCO2 arterial: 39 mmHg (ref 32–48)
pH, Arterial: 7.249 — ABNORMAL LOW (ref 7.35–7.45)
pH, Arterial: 7.263 — ABNORMAL LOW (ref 7.35–7.45)
pH, Arterial: 7.309 — ABNORMAL LOW (ref 7.35–7.45)
pH, Arterial: 7.338 — ABNORMAL LOW (ref 7.35–7.45)
pH, Arterial: 7.33 — ABNORMAL LOW (ref 7.35–7.45)
pH, Arterial: 7.364 (ref 7.35–7.45)
pO2, Arterial: 238 mmHg — ABNORMAL HIGH (ref 83–108)
pO2, Arterial: 98 mmHg (ref 83–108)
pO2, Arterial: 236 mmHg — ABNORMAL HIGH (ref 83–108)
pO2, Arterial: 65 mmHg — ABNORMAL LOW (ref 83–108)
pO2, Arterial: 289 mmHg — ABNORMAL HIGH (ref 83–108)
pO2, Arterial: 66 mmHg — ABNORMAL LOW (ref 83–108)

## 2023-03-25 LAB — BPAM RBC
Blood Product Expiration Date: 202503152359
Blood Product Expiration Date: 202503192359
ISSUE DATE / TIME: 202502282035
ISSUE DATE / TIME: 202503010951
Unit Type and Rh: 5100
Unit Type and Rh: 5100

## 2023-03-25 LAB — POCT ACTIVATED CLOTTING TIME
Activated Clotting Time: 164 s
Activated Clotting Time: 279 s
Activated Clotting Time: 233 s
Activated Clotting Time: 250 s

## 2023-03-25 LAB — HEPARIN LEVEL (UNFRACTIONATED): Heparin Unfractionated: <0.1 [IU]/mL — ABNORMAL LOW (ref 0.30–0.70)

## 2023-03-25 MED ORDER — SODIUM CHLORIDE 1 G PO TABS
1.0000 g | ORAL_TABLET | Freq: Every day | ORAL | Status: DC
  Administered 2023-03-25 – 2023-03-29 (×5): 1 g via ORAL
  Filled 2023-03-25 (×5): qty 1

## 2023-03-25 NOTE — Plan of Care (Signed)
  Problem: Education: Goal: Knowledge of General Education information will improve Description: Including pain rating scale, medication(s)/side effects and non-pharmacologic comfort measures Outcome: Progressing   Problem: Health Behavior/Discharge Planning: Goal: Ability to manage health-related needs will improve Outcome: Progressing   Problem: Activity: Goal: Risk for activity intolerance will decrease Outcome: Progressing   Problem: Nutrition: Goal: Adequate nutrition will be maintained Outcome: Progressing   Problem: Coping: Goal: Level of anxiety will decrease Outcome: Progressing   Problem: Pain Managment: Goal: General experience of comfort will improve and/or be controlled Outcome: Progressing   Problem: Skin Integrity: Goal: Risk for impaired skin integrity will decrease Outcome: Progressing   Problem: Clinical Measurements: Goal: Postoperative complications will be avoided or minimized Outcome: Progressing

## 2023-03-25 NOTE — Plan of Care (Signed)
   Problem: Education: Goal: Knowledge of General Education information will improve Description: Including pain rating scale, medication(s)/side effects and non-pharmacologic comfort measures Outcome: Progressing   Problem: Pain Managment: Goal: General experience of comfort will improve and/or be controlled Outcome: Progressing   Problem: Safety: Goal: Ability to remain free from injury will improve Outcome: Progressing

## 2023-03-25 NOTE — Progress Notes (Signed)
    Subjective  - POD # 2  Complaining of left leg pain   Physical Exam:  Excellent pedal Doppler signals Dressing removed.  Incisions are healing appropriately       Assessment/Plan:  POD #2  -Hypotension: She was given 1 unit of blood for acute blood loss anemia yesterday in addition to fluid boluses.  She continued to have low blood pressures.  I made sure she was off all of her antihypertensives.  She was started on low-dose Levophed.  We will try to wean this off today. -Prophylaxis: Protonix -Continue IV heparin -Will require mobilization and get her ambulating in the hallways  Wells Errica Dutil 03/25/2023 11:01 AM --  Vitals:   03/25/23 1000 03/25/23 1015  BP: (!) 109/58 (!) 107/56  Pulse: 78 80  Resp: 15 18  Temp:    SpO2: 91% 91%    Intake/Output Summary (Last 24 hours) at 03/25/2023 1101 Last data filed at 03/25/2023 0700 Gross per 24 hour  Intake 1865.11 ml  Output 2060 ml  Net -194.89 ml     Laboratory CBC    Component Value Date/Time   WBC 9.3 03/24/2023 0359   HGB 8.6 (L) 03/24/2023 1659   HCT 25.2 (L) 03/24/2023 1659   PLT 144 (L) 03/24/2023 0359    BMET    Component Value Date/Time   NA 138 03/24/2023 0359   K 3.7 03/24/2023 0359   CL 105 03/24/2023 0359   CO2 22 03/24/2023 0359   GLUCOSE 115 (H) 03/24/2023 0359   BUN 6 (L) 03/24/2023 0359   CREATININE 0.73 03/24/2023 0359   CREATININE 0.73 03/26/2020 1520   CALCIUM 7.1 (L) 03/24/2023 0359   GFRNONAA >60 03/24/2023 0359   GFRAA >60 06/26/2017 1043    COAG Lab Results  Component Value Date   INR 1.0 03/21/2023   INR 0.94 04/17/2014   INR 1.0 07/12/2007   No results found for: "PTT"  Antibiotics Anti-infectives (From admission, onward)    Start     Dose/Rate Route Frequency Ordered Stop   03/24/23 1030  piperacillin-tazobactam (ZOSYN) IVPB 3.375 g        3.375 g 12.5 mL/hr over 240 Minutes Intravenous Every 8 hours 03/24/23 1027     03/23/23 0753  ceFAZolin (ANCEF) IVPB  2g/100 mL premix        2 g 200 mL/hr over 30 Minutes Intravenous 30 min pre-op 03/23/23 0753 03/23/23 1440        V. Charlena Cross, M.D., [facility] [place] Office: [phone] Pager:  [phone]

## 2023-03-26 ENCOUNTER — Encounter (HOSPITAL_COMMUNITY): Payer: Self-pay | Admitting: Vascular Surgery

## 2023-03-26 DIAGNOSIS — L600 Ingrowing nail: Secondary | ICD-10-CM

## 2023-03-26 LAB — BASIC METABOLIC PANEL
Anion gap: 9 (ref 5–15)
BUN: <5 mg/dL — ABNORMAL LOW (ref 8–23)
CO2: 25 mmol/L (ref 22–32)
Calcium: 7.4 mg/dL — ABNORMAL LOW (ref 8.9–10.3)
Chloride: 102 mmol/L (ref 98–111)
Creatinine, Ser: 0.55 mg/dL (ref 0.44–1.00)
GFR, Estimated: >60 mL/min (ref >60)
Glucose, Bld: 94 mg/dL (ref 70–99)
Potassium: 3.1 mmol/L — ABNORMAL LOW (ref 3.5–5.1)
Sodium: 136 mmol/L (ref 135–145)

## 2023-03-26 LAB — CBC
HCT: 22.5 % — ABNORMAL LOW (ref 36.0–46.0)
HCT: 26 % — ABNORMAL LOW (ref 36.0–46.0)
Hemoglobin: 7.6 g/dL — ABNORMAL LOW (ref 12.0–15.0)
Hemoglobin: 9.1 g/dL — ABNORMAL LOW (ref 12.0–15.0)
MCH: 33.3 pg (ref 26.0–34.0)
MCH: 33.6 pg (ref 26.0–34.0)
MCHC: 33.8 g/dL (ref 30.0–36.0)
MCHC: 35 g/dL (ref 30.0–36.0)
MCV: 98.7 fL (ref 80.0–100.0)
MCV: 95.9 fL (ref 80.0–100.0)
Platelets: 103 10*3/uL — ABNORMAL LOW (ref 150–400)
Platelets: 102 10*3/uL — ABNORMAL LOW (ref 150–400)
RBC: 2.28 MIL/uL — ABNORMAL LOW (ref 3.87–5.11)
RBC: 2.71 MIL/uL — ABNORMAL LOW (ref 3.87–5.11)
RDW: 17.4 % — ABNORMAL HIGH (ref 11.5–15.5)
RDW: 17.6 % — ABNORMAL HIGH (ref 11.5–15.5)
WBC: 8 10*3/uL (ref 4.0–10.5)
WBC: 9.3 10*3/uL (ref 4.0–10.5)
nRBC: 0 % (ref 0.0–0.2)
nRBC: 0 % (ref 0.0–0.2)

## 2023-03-26 LAB — HEPARIN LEVEL (UNFRACTIONATED): Heparin Unfractionated: <0.1 [IU]/mL — ABNORMAL LOW (ref 0.30–0.70)

## 2023-03-26 LAB — PREPARE RBC (CROSSMATCH)

## 2023-03-26 LAB — MAGNESIUM: Magnesium: 0.9 mg/dL — CL (ref 1.7–2.4)

## 2023-03-26 MED ORDER — MUPIROCIN CALCIUM 2 % EX CREA
TOPICAL_CREAM | Freq: Every day | CUTANEOUS | Status: DC
  Filled 2023-03-26 (×2): qty 15

## 2023-03-26 MED ORDER — LIDOCAINE HCL (PF) 2 % IJ SOLN
10.0000 mL | Freq: Once | INTRAMUSCULAR | Status: AC
  Administered 2023-03-26: 10 mL
  Filled 2023-03-26: qty 10

## 2023-03-26 MED ORDER — MAGNESIUM SULFATE 2 GM/50ML IV SOLN
2.0000 g | Freq: Once | INTRAVENOUS | Status: AC
  Administered 2023-03-27: 2 g via INTRAVENOUS
  Filled 2023-03-26: qty 50

## 2023-03-26 MED ORDER — SODIUM CHLORIDE 0.9% IV SOLUTION: Freq: Once | INTRAVENOUS | Status: AC

## 2023-03-26 MED ORDER — MAGNESIUM SULFATE 4 GM/100ML IV SOLN
4.0000 g | Freq: Once | INTRAVENOUS | Status: AC
  Administered 2023-03-26: 4 g via INTRAVENOUS
  Filled 2023-03-26: qty 100

## 2023-03-26 MED ORDER — POTASSIUM CHLORIDE CRYS ER 20 MEQ PO TBCR
20.0000 meq | EXTENDED_RELEASE_TABLET | Freq: Once | ORAL | Status: AC
  Administered 2023-03-26: 20 meq via ORAL
  Filled 2023-03-26: qty 1

## 2023-03-26 MED ORDER — POTASSIUM CHLORIDE CRYS ER 20 MEQ PO TBCR
40.0000 meq | EXTENDED_RELEASE_TABLET | Freq: Once | ORAL | Status: AC
  Administered 2023-03-26: 40 meq via ORAL
  Filled 2023-03-26: qty 2

## 2023-03-26 MED ORDER — ORAL CARE MOUTH RINSE: 15.0000 mL | OROMUCOSAL | Status: DC | PRN

## 2023-03-26 NOTE — Progress Notes (Signed)
 Pt arrived to unit from 2 heart . Chg givenVSS, A/O x 4,  CCMD called ,CHG given, pt oriented to unit,Will continue to monitor.   Karna Christmas Corine Solorio, RN    03/26/23 1751  Vitals  Temp 98.8 F (37.1 C)  Temp Source Oral  BP 130/65  MAP (mmHg) 82  BP Location Left Arm  BP Method Automatic  Patient Position (if appropriate) Lying  Pulse Rate 77  Pulse Rate Source Monitor  ECG Heart Rate 81  Resp 20  Oxygen Therapy  SpO2 97 %  O2 Device Nasal Cannula  MEWS Score  MEWS Temp 0  MEWS Systolic 0  MEWS Pulse 0  MEWS RR 0  MEWS LOC 0  MEWS Score 0  MEWS Score Color Chilton Si

## 2023-03-26 NOTE — Evaluation (Signed)
 Occupational Therapy Evaluation Patient Details Name: Gabrielle [name] MRN: [id number] DOB: [birth year] Today's Date: 03/26/2023   History of Present Illness   68 y.o. female admitted on 03/23/2023 with L lower extremity critical limb ischemia. S/p  left iliofemoral endarterectomy with saphenous vein tibioperoneal trunk bypass requiring takeback for thrombectomy and revision of distal anastomosis.  PMH: smoking (40 pack years, quit 2016), NSCLC s/p right upper lobectomy 2016 (did not require chemo or radiation), asthma, emphysema, nocturnal hypoxemia on 2 to 3 L O2 at night.HTN, GERD, breast cancer s/p right lumpectomy and SLNB 2014     Clinical Impressions PTA, pt lives with family and typically completely independent in all ADLs, IADLs and mobility without AD. Pt also works as a Research scientist (life sciences). Pt presents now with minor deficits in pain, endurance and strength. Pt eager to mobilize and able to walk out into hallway briefly using RW w/ CGA. Due to discomfort with L groin wound vac, pt requires Min-Mod A for LB ADLs though anticipate steady improvements at the acute level. Pt endorses having assist as needed at home. Recommend HHOT at DC.   SpO2 92-93% on 2 L O2, HR 118-121bpm w/ activity      If plan is discharge home, recommend the following:   A little help with bathing/dressing/bathroom;Assistance with cooking/housework;Assist for transportation;Help with stairs or ramp for entrance     Functional Status Assessment   Patient has had a recent decline in their functional status and demonstrates the ability to make significant improvements in function in a reasonable and predictable amount of time.     Equipment Recommendations   BSC/3in1;Other (comment) (RW)     Recommendations for Other Services         Precautions/Restrictions   Precautions Precautions: Fall;Other (comment) Precaution/Restrictions Comments: L groin wound vac, monitor O2 Restrictions Weight  Bearing Restrictions Per Provider Order: No     Mobility Bed Mobility Overal bed mobility: Needs Assistance Bed Mobility: Supine to Sit     Supine to sit: Supervision, Used rails, HOB elevated     General bed mobility comments: increased time/effort but pt able to manage without assist    Transfers Overall transfer level: Needs assistance Equipment used: Rolling walker (2 wheels) Transfers: Sit to/from Stand Sit to Stand: Contact guard assist           General transfer comment: to stand from bedside and then later from recliner to readjust posture      Balance Overall balance assessment: Needs assistance Sitting-balance support: Feet unsupported, No upper extremity supported Sitting balance-Leahy Scale: Fair     Standing balance support: Bilateral upper extremity supported, During functional activity, Reliant on assistive device for balance Standing balance-Leahy Scale: Poor                             ADL either performed or assessed with clinical judgement   ADL Overall ADL's : Needs assistance/impaired Eating/Feeding: Independent   Grooming: Contact guard assist;Standing   Upper Body Bathing: Set up;Sitting   Lower Body Bathing: Minimal assistance;Sitting/lateral leans;Sit to/from stand   Upper Body Dressing : Set up;Sitting   Lower Body Dressing: Moderate assistance;Sitting/lateral leans;Sit to/from stand   Toilet Transfer: Contact guard assist;Ambulation;Rolling walker (2 wheels)   Toileting- Clothing Manipulation and Hygiene: Minimal assistance;Sitting/lateral lean;Sit to/from stand       Functional mobility during ADLs: Contact guard assist;Rolling walker (2 wheels) General ADL Comments: Able to mobilize out into hallway with RW today,  pt eager to do as much as she can w/o assist. Discussed where some assist may be needed for LB ADLs, obtaining shower chair vs sponge bathing and assist for IADLs     Vision Baseline Vision/History: 1  Wears glasses Ability to See in Adequate Light: 0 Adequate Patient Visual Report: No change from baseline Vision Assessment?: No apparent visual deficits     Perception         Praxis         Pertinent Vitals/Pain Pain Assessment Pain Assessment: Faces Faces Pain Scale: Hurts little more Pain Location: L LE/groin with movement Pain Descriptors / Indicators: Grimacing, Guarding, Sore Pain Intervention(s): Limited activity within patient's tolerance, Monitored during session     Extremity/Trunk Assessment Upper Extremity Assessment Upper Extremity Assessment: Generalized weakness;Right hand dominant   Lower Extremity Assessment Lower Extremity Assessment: Defer to PT evaluation   Cervical / Trunk Assessment Cervical / Trunk Assessment: Kyphotic   Communication Communication Communication: No apparent difficulties   Cognition Arousal: Alert Behavior During Therapy: WFL for tasks assessed/performed Cognition: No apparent impairments                               Following commands: Intact       Cueing  General Comments   Cueing Techniques: Verbal cues;Gestural cues      Exercises     Shoulder Instructions      Home Living Family/patient expects to be discharged to:: Private residence Living Arrangements: Spouse/significant other;Children Available Help at Discharge: Family;Available 24 hours/day Type of Home: House Home Access: Stairs to enter [employer] of Steps: 3   Home Layout: One level     Bathroom Shower/Tub: Chief Strategy Officer: Standard Bathroom Accessibility: Yes   Home Equipment: Cane - single point          Prior Functioning/Environment Prior Level of Function : Independent/Modified Independent;Working/employed;Driving             Mobility Comments: L LE pain with walking but able to ambulate limited community distances. uses grocery cart for support ADLs Comments: Indep with ADLs, works  as a Mining engineer Problem List: Decreased strength;Decreased activity tolerance;Impaired balance (sitting and/or standing);Decreased knowledge of use of DME or AE;Cardiopulmonary status limiting activity;Pain   OT Treatment/Interventions: Self-care/ADL training;Therapeutic exercise;Energy conservation;DME and/or AE instruction;Therapeutic activities;Patient/family education;Balance training      OT Goals(Current goals can be found in the care plan section)   Acute Rehab OT Goals Patient Stated Goal: walk more OT Goal Formulation: With patient Time For Goal Achievement: 04/09/23 Potential to Achieve Goals: Good   OT Frequency:  Min 1X/week    Co-evaluation              AM-PAC OT "6 Clicks" Daily Activity     Outcome Measure Help from another person eating meals?: None Help from another person taking care of personal grooming?: A Little Help from another person toileting, which includes using toliet, bedpan, or urinal?: A Little Help from another person bathing (including washing, rinsing, drying)?: A Little Help from another person to put on and taking off regular upper body clothing?: A Little Help from another person to put on and taking off regular lower body clothing?: A Lot 6 Click Score: 18   End of Session Equipment Utilized During Treatment: Gait belt;Rolling walker (2 wheels);Oxygen Nurse Communication: Mobility status (RN present at start of session)  Activity Tolerance: Patient tolerated  treatment well Patient left: in chair;with call bell/phone within reach;with chair alarm set  OT Visit Diagnosis: Unsteadiness on feet (R26.81);Muscle weakness (generalized) (M62.81)                Time: 1914-7829 OT Time Calculation (min): 28 min Charges:  OT General Charges $OT Visit: 1 Visit OT Evaluation $OT Eval Moderate Complexity: 1 Mod OT Treatments $Therapeutic Activity: 8-22 mins  Bradd Canary, OTR/L Acute Rehab Services Office: [phone]    Lorre Munroe 03/26/2023, 10:05 AM

## 2023-03-26 NOTE — Progress Notes (Addendum)
  Progress Note    03/26/2023 9:05 AM 3 Days Post-Op  Subjective:  says her left leg feels sore. Denies any pain in the left foot    Vitals:   03/26/23 0730 03/26/23 0800  BP:  136/63  Pulse:  (!) 108  Resp:  (!) 22  Temp: (!) 100.6 F (38.1 C)   SpO2:  91%    Physical Exam: General:  laying in bed, NAD Cardiac:  regular Lungs:  nonlabored Incisions:  L groin incision intact with staples, moderate serosanguineous drainage. L proximal thigh saphenectomy site intact with moderate drainage. Other LLE incisions well appearing and dry Extremities:  LLE warm and well perfused with brisk DP and peroneal doppler signals  CBC    Component Value Date/Time   WBC 8.0 03/26/2023 0416   RBC 2.28 (L) 03/26/2023 0416   HGB 7.6 (L) 03/26/2023 0416   HCT 22.5 (L) 03/26/2023 0416   PLT 103 (L) 03/26/2023 0416   MCV 98.7 03/26/2023 0416   MCH 33.3 03/26/2023 0416   MCHC 33.8 03/26/2023 0416   RDW 17.4 (H) 03/26/2023 0416   LYMPHSABS 3.0 12/10/2020 0908   MONOABS 0.6 12/10/2020 0908   EOSABS 0.2 12/10/2020 0908   BASOSABS 0.1 12/10/2020 0908    BMET    Component Value Date/Time   NA 136 03/26/2023 0416   K 3.1 (L) 03/26/2023 0416   CL 102 03/26/2023 0416   CO2 25 03/26/2023 0416   GLUCOSE 94 03/26/2023 0416   BUN <5 (L) 03/26/2023 0416   CREATININE 0.55 03/26/2023 0416   CREATININE 0.73 03/26/2020 1520   CALCIUM 7.4 (L) 03/26/2023 0416   GFRNONAA >60 03/26/2023 0416   GFRAA >60 06/26/2017 1043    INR    Component Value Date/Time   INR 1.0 03/21/2023 1300     Intake/Output Summary (Last 24 hours) at 03/26/2023 0905 Last data filed at 03/26/2023 0700 Gross per 24 hour  Intake 296.93 ml  Output 3300 ml  Net -3003.07 ml      Assessment/Plan:  68 y.o. female is 3 days post op, s/p: left iliofemoral endarterectomy and left femoral to TPT bypass, with takeback profunda thrombectomy, re-do proximal and distal anastomosis, and TPT endarterectomy   -She continues to  improve clinically. She is still having some soreness in the left leg -Her vac in the left groin was removed/fell off yesterday. Her incisions are well appearing but have moderate serous drainage from the groin. We placed a new prevena vac on the left groin this morning. Hopefully this can stay on for 7 days at least -Her other LLE incisions are well appearing and dry -LLE well perfused with brisk DP and peroneal doppler signals -Hgb at 7.6 this AM. Will order 1U PRBCs and repeat H&H after -BP improved and no longer requiring pressors -Okay to d/c foley. Continue on heparin gtt. Continue to mobilize. Will transfer to 4E    Loel Dubonnet, PA-C [facility] [phone] 03/26/2023 9:05 AM

## 2023-03-26 NOTE — Progress Notes (Signed)
 Physical Therapy Treatment Patient Details Name: Gabrielle [name] MRN: [id number] DOB: May 11, 1955 Today's Date: 03/26/2023   History of Present Illness 68 y.o. female admitted on 03/23/2023 with L lower extremity critical limb ischemia. S/p  left iliofemoral endarterectomy with saphenous vein tibioperoneal trunk bypass requiring takeback for thrombectomy and revision of distal anastomosis.  PMH: smoking (40 pack years, quit 2016), NSCLC s/p right upper lobectomy 2016 (did not require chemo or radiation), asthma, emphysema, nocturnal hypoxemia on 2 to 3 L O2 at night.HTN, GERD, breast cancer s/p right lumpectomy and SLNB 2014    PT Comments  Pt making good progress with mobility. Did well with rollator and could benefit from one at home. Continue to recommend return home with HHPT.     If plan is discharge home, recommend the following: A little help with walking and/or transfers;A little help with bathing/dressing/bathroom;Assistance with cooking/housework;Assist for transportation;Help with stairs or ramp for entrance   Can travel by private vehicle        Equipment Recommendations  Rollator (4 wheels);BSC/3in1    Recommendations for Other Services       Precautions / Restrictions Precautions Precautions: Fall;Other (comment) Precaution/Restrictions Comments: L groin wound vac, monitor O2 Restrictions Weight Bearing Restrictions Per Provider Order: No     Mobility  Bed Mobility Overal bed mobility: Needs Assistance Bed Mobility: Supine to Sit, Sit to Supine     Supine to sit: Supervision, Used rails, HOB elevated Sit to supine: Min assist   General bed mobility comments: Incr time and effort. Assist to bring feet back up into bed    Transfers Overall transfer level: Needs assistance Equipment used: Rollator (4 wheels) Transfers: Sit to/from Stand Sit to Stand: Contact guard assist           General transfer comment: Assist for safety and lines     Ambulation/Gait Ambulation/Gait assistance: Contact guard assist Gait Distance (Feet): 190 Feet Assistive device: Rollator (4 wheels) Gait Pattern/deviations: Step-through pattern, Decreased stride length, Decreased stance time - left Gait velocity: decr Gait velocity interpretation: 1.31 - 2.62 ft/sec, indicative of limited community ambulator   General Gait Details: Assist for safety and lines   Stairs             Wheelchair Mobility     Tilt Bed    Modified Rankin (Stroke Patients Only)       Balance Overall balance assessment: Needs assistance Sitting-balance support: Feet unsupported, No upper extremity supported Sitting balance-Leahy Scale: Fair     Standing balance support: Bilateral upper extremity supported, During functional activity, Reliant on assistive device for balance Standing balance-Leahy Scale: Poor Standing balance comment: UE support and CGA for static standing                            Communication Communication Communication: No apparent difficulties  Cognition Arousal: Alert Behavior During Therapy: WFL for tasks assessed/performed   PT - Cognitive impairments: No apparent impairments                         Following commands: Intact      Cueing Cueing Techniques: Verbal cues  Exercises      General Comments General comments (skin integrity, edema, etc.): VSS on RA. SpO2 89% with amb      Pertinent Vitals/Pain Pain Assessment Pain Assessment: Faces Faces Pain Scale: Hurts little more Pain Location: L LE/groin with movement Pain Descriptors / Indicators: Grimacing,  Guarding, Sore Pain Intervention(s): Limited activity within patient's tolerance, Monitored during session, Repositioned    Home Living                          Prior Function            PT Goals (current goals can now be found in the care plan section) Progress towards PT goals: Progressing toward goals     Frequency    Min 1X/week      PT Plan      Co-evaluation              AM-PAC PT "6 Clicks" Mobility   Outcome Measure  Help needed turning from your back to your side while in a flat bed without using bedrails?: A Little Help needed moving from lying on your back to sitting on the side of a flat bed without using bedrails?: A Little Help needed moving to and from a bed to a chair (including a wheelchair)?: A Little Help needed standing up from a chair using your arms (e.g., wheelchair or bedside chair)?: A Little Help needed to walk in hospital room?: A Little Help needed climbing 3-5 steps with a railing? : A Little 6 Click Score: 18    End of Session Equipment Utilized During Treatment: Gait belt Activity Tolerance: Patient tolerated treatment well Patient left: in chair;with call bell/phone within reach;with bed alarm set Nurse Communication: Mobility status PT Visit Diagnosis: Muscle weakness (generalized) (M62.81);Difficulty in walking, not elsewhere classified (R26.2);Pain;Other abnormalities of gait and mobility (R26.89);Unsteadiness on feet (R26.81) Pain - Right/Left: Left Pain - part of body: Hip     Time: 1449-1511 PT Time Calculation (min) (ACUTE ONLY): 22 min  Charges:    $Gait Training: 8-22 mins PT General Charges $$ ACUTE PT VISIT: 1 Visit                     [facility] PT Acute Rehabilitation Services Office [phone]    Angelina Ok [facility] 03/26/2023, 5:35 PM

## 2023-03-26 NOTE — Consult Note (Signed)
 PODIATRY CONSULTATION  NAME Gabrielle [name] MRN [id number] DOB 1955-08-15 DOA 03/23/2023   Reason for consult: Ingrown Nail left hallux  Attending/Consulting physician: Lyna Poser MD  History of present illness: 68 y.o. female POD # 1, status post left iliofemoral endarterectomy with saphenous vein tibioperoneal trunk bypass requiring takeback for thrombectomy and revision of distal anastomosis. Also with concern for ingrown nail on left great toe medial border. Previously removed but grew back ingrown and painful. Patient hopeful to have it removed here.   Past Medical History:  Diagnosis Date   Anemia    Arthritis    Asthma    Carpal tunnel syndrome    bil   Carpal tunnel syndrome    Chronic pain    Chronic pain in left foot    Complication of anesthesia    states mederate sedation did now worke, was awake   Cough    Depression    espisodic   Drug abuse (HCC)    Dyspnea    with exertion an have failed over night oximenrty - wears oxygen at hs   GERD (gastroesophageal reflux disease)    H/O eating disorder    history of Substance abuse (HCC)    Substance abuse in the past.     Hyperlipidemia    Hypertension    Invasive ductal carcinoma of right breast (HCC) 08/21/2012   Right   Legally blind    Lung cancer (HCC) 2016   Orthostatic hypotension    Pneumonia    03-13-17   S/P bilateral breast implants 08/21/2012   Prior to cancer diagnosis    Sleep apnea    Wears 2-3L oxygen at night   Tobacco abuse 08/21/2012   > 40 pack years       Latest Ref Rng & Units 03/26/2023    4:16 AM 03/24/2023    4:59 PM 03/24/2023    3:59 AM  CBC  WBC 4.0 - 10.5 K/uL 8.0   9.3   Hemoglobin 12.0 - 15.0 g/dL 7.6  8.6  8.5   Hematocrit 36.0 - 46.0 % 22.5  25.2  24.7   Platelets 150 - 400 K/uL 103   144        Latest Ref Rng & Units 03/26/2023    4:16 AM 03/24/2023    3:59 AM 03/23/2023   11:16 PM  BMP  Glucose 70 - 99 mg/dL 94  409    BUN 8 - 23 mg/dL <5  6    Creatinine 8.11 -  1.00 mg/dL 9.14  7.82  9.56   Sodium 135 - 145 mmol/L 136  138    Potassium 3.5 - 5.1 mmol/L 3.1  3.7    Chloride 98 - 111 mmol/L 102  105    CO2 22 - 32 mmol/L 25  22    Calcium 8.9 - 10.3 mg/dL 7.4  7.1        Physical Exam: Lower Extremity Exam Incurvation is present along the medial nail border of the left great toe. There is localized edema without any erythema or increase in warmth around the nail border. There is no drainage or pus. There is no ascending cellulitis. No malodor. No open lesions or pre-ulcerative lesions.      ASSESSMENT/PLAN OF CARE 68 y.o. female with PMHx significant for  PVD s/p: left iliofemoral endarterectomy and left femoral to TPT bypass, with takeback profunda thrombectomy, re-do proximal and distal anastomosis, and TPT endarterectomy and  with left hallux ingrown nail medial border  Recommend L hallux nail avulsion medial border. Advised possibility that nail could grow back in future and need further procedure.   At this time, recommended partial nail removal without chemical matricectomy to the left hallux medial border.. Risks and complications were discussed with the patient for which they understand and  verbally consent to the procedure. Under sterile conditions a total of 3 mL of a mixture of 2% lidocaine plain was infiltrated in a hallux block fashion. Once anesthetized, the skin was prepped in sterile fashion. Next the medial border of the left hallux nail border was sharply excised making sure to remove the entire offending nail border. Once the nail was  Removed, the area was debrided and the underlying skin was intact. The area was irrigated and hemostasis was obtained.  A dry sterile dressing was applied. After application of the dressing the tourniquet was removed and there is found to be an immediate capillary refill time to the digit. The patient tolerated the procedure well any complications. Post procedure instructions were discussed the patient  for which he verbally understood.   Follow-up in two week for nail check or sooner if any problems are to arise. Discussed signs/symptoms of worsening infection and directed to call the office immediately should any occur or go directly to the emergency room. In the meantime, encouraged to call the office with any questions, concerns, changes symptoms.  Soak Instructions    THE DAY AFTER THE PROCEDURE  Place 1/4 cup of epsom salts (or betadine, or white vinegar) in a quart of warm tap water.  Submerge your foot or feet with outer bandage intact for the initial soak; this will allow the bandage to become moist and wet for easy lift off.  Once you remove your bandage, continue to soak in the solution for 20 minutes.  This soak should be done twice a day.  Next, remove your foot or feet from solution, blot dry the affected area and cover.  You may use a band aid large enough to cover the area or use gauze and tape.  Apply other medications to the area as directed by the doctor such as polysporin neosporin.  IF YOUR SKIN BECOMES IRRITATED WHILE USING THESE INSTRUCTIONS, IT IS OKAY TO SWITCH TO  WHITE VINEGAR AND WATER. Or you may use antibacterial soap and water to keep the toe clean  Monitor for any signs/symptoms of infection. Call the office immediately if any occur or go directly to the emergency room. Call with any questions/concerns.    Long Term Care Instructions-Post Nail Surgery  You have had your ingrown toenail and root treated with a chemical.  This chemical causes a burn that will drain and ooze like a blister.  This can drain for 6-8 weeks or longer.  It is important to keep this area clean, covered, and follow the soaking instructions dispensed at the time of your surgery.  This area will eventually dry and form a scab.  Once the scab forms you no longer need to soak or apply a dressing.  If at any time you experience an increase in pain, redness, swelling, or drainage, you should  contact the office as soon as possible.     Thank you for the consult.  Please contact me directly with any questions or concerns.           Corinna Gab, DPM [facility]    [address]  [place], [place] [address]                Office [phone]  Fax [phone]

## 2023-03-26 NOTE — Progress Notes (Signed)
 The day and night shift nurses checked the patient's left groin incision site where the Prevena wound vac was.  Noticed that the Tegaderm dressing above the groin site had this white fluid like substance underneath.  The day nurse called Dr. Karin Lieu, who ordered to remove the wound vac, clean the site around the left groin, and place clean gauze and abdominal pads over it.  Will continue to monitor.  Harriet Masson, RN

## 2023-03-26 NOTE — Progress Notes (Addendum)
    Subjective  - 3 Days Post-Op   States she is doing better than yesterday. VAC from left groin off. Serous drainage from fasciotomy site.    Physical Exam:  Excellent pedal Doppler signals Dressing removed.  Incisions are healing appropriately LLE swelling   Assessment/Plan:  POD #2  Left-sided incisional VAC replaced.  Incorporated the [facility] over the left upper thigh saphenectomy site Overall asymptomatic.  Off of levo Little anemic this morning.  Will give blood Transfer out of the unit Oxygen overnight-3 L baseline Up out of bed as tolerated.  Make sure the [facility] is plugged in when not out of bed. Will likely transition to therapeutic heparin dose tomorrow.   Victorino Sparrow 03/26/2023 9:40 AM --  Vitals:   03/26/23 0730 03/26/23 0800  BP:  136/63  Pulse:  (!) 108  Resp:  (!) 22  Temp: (!) 100.6 F (38.1 C)   SpO2:  91%    Intake/Output Summary (Last 24 hours) at 03/26/2023 0940 Last data filed at 03/26/2023 0700 Gross per 24 hour  Intake 296.93 ml  Output 3300 ml  Net -3003.07 ml     Laboratory CBC    Component Value Date/Time   WBC 8.0 03/26/2023 0416   HGB 7.6 (L) 03/26/2023 0416   HCT 22.5 (L) 03/26/2023 0416   PLT 103 (L) 03/26/2023 0416    BMET    Component Value Date/Time   NA 136 03/26/2023 0416   K 3.1 (L) 03/26/2023 0416   CL 102 03/26/2023 0416   CO2 25 03/26/2023 0416   GLUCOSE 94 03/26/2023 0416   BUN <5 (L) 03/26/2023 0416   CREATININE 0.55 03/26/2023 0416   CREATININE 0.73 03/26/2020 1520   CALCIUM 7.4 (L) 03/26/2023 0416   GFRNONAA >60 03/26/2023 0416   GFRAA >60 06/26/2017 1043    COAG Lab Results  Component Value Date   INR 1.0 03/21/2023   INR 0.94 04/17/2014   INR 1.0 07/12/2007   No results found for: "PTT"  Antibiotics Anti-infectives (From admission, onward)    Start     Dose/Rate Route Frequency Ordered Stop   03/24/23 1030  piperacillin-tazobactam (ZOSYN) IVPB 3.375 g        3.375 g 12.5 mL/hr over 240  Minutes Intravenous Every 8 hours 03/24/23 1027     03/23/23 0753  ceFAZolin (ANCEF) IVPB 2g/100 mL premix        2 g 200 mL/hr over 30 Minutes Intravenous 30 min pre-op 03/23/23 0753 03/23/23 1440       Victorino Sparrow MD [facility] Office: [phone] Pager:  [phone]

## 2023-03-27 LAB — BASIC METABOLIC PANEL
Anion gap: 9 (ref 5–15)
BUN: <5 mg/dL — ABNORMAL LOW (ref 8–23)
CO2: 25 mmol/L (ref 22–32)
Calcium: 7.6 mg/dL — ABNORMAL LOW (ref 8.9–10.3)
Chloride: 99 mmol/L (ref 98–111)
Creatinine, Ser: 0.61 mg/dL (ref 0.44–1.00)
GFR, Estimated: >60 mL/min (ref >60)
Glucose, Bld: 101 mg/dL — ABNORMAL HIGH (ref 70–99)
Potassium: 3.4 mmol/L — ABNORMAL LOW (ref 3.5–5.1)
Sodium: 133 mmol/L — ABNORMAL LOW (ref 135–145)

## 2023-03-27 LAB — TYPE AND SCREEN
ABO/RH(D): O POS
Antibody Screen: NEGATIVE
Unit division: 0

## 2023-03-27 LAB — BPAM RBC
Blood Product Expiration Date: 202503212359
ISSUE DATE / TIME: 202503031118
Unit Type and Rh: 5100

## 2023-03-27 LAB — HEPARIN LEVEL (UNFRACTIONATED): Heparin Unfractionated: <0.1 [IU]/mL — ABNORMAL LOW (ref 0.30–0.70)

## 2023-03-27 MED ORDER — POTASSIUM CHLORIDE CRYS ER 20 MEQ PO TBCR
20.0000 meq | EXTENDED_RELEASE_TABLET | Freq: Once | ORAL | Status: AC
  Administered 2023-03-27: 20 meq via ORAL
  Filled 2023-03-27: qty 1

## 2023-03-27 NOTE — Progress Notes (Signed)
 PHARMACY - ANTICOAGULATION CONSULT NOTE  Pharmacy Consult for Heparin Indication: DVT  Allergies  Allergen Reactions   Hydrocodone Other (See Comments)    Makes her hyper   Bactrim [Sulfamethoxazole-Trimethoprim] Other (See Comments)    hyponatremia    Patient Measurements: Height: 5\' 1"  (154.9 cm) Weight: 71.6 kg (157 lb 13.6 oz) IBW/kg (Calculated) : 47.8 Heparin Dosing Weight: 64.2 kg  Vital Signs: Temp: 98.6 F (37 C) (03/04 1558) Temp Source: Axillary (03/04 1558) BP: 115/78 (03/04 1558) Pulse Rate: 81 (03/04 1558)  Labs: Recent Labs    03/25/23 0503 03/26/23 0416 03/26/23 1630 03/27/23 0352 03/27/23 1700  HGB  --  7.6* 9.1*  --   --   HCT  --  22.5* 26.0*  --   --   PLT  --  103* 102*  --   --   HEPARINUNFRC <0.10* <0.10*  --   --  <0.10*  CREATININE  --  0.55  --  0.61  --     Estimated Creatinine Clearance: 63.4 mL/min (by C-G formula based on SCr of 0.61 mg/dL).  Assessment: 68 yo with left lower extremity critical and ischemic tissue loss. S/p Left iliofemoral endarterectomy, embolectomy, extended onto the profunda with greater saphenous vein patch Left greater saphenous vein harvest Left femoral to tibioperoneal trunk bypass using reverse greater saphenous vein  Patient has been on fixed dose heparin since surgery. Pharmacy consulted to start therapeutic dose heparin.  Heparin level is subtherapeutic at <0.1 on 1050 units/hr. No bleeding or infusion issues per RN.   Goal of Therapy:  Heparin level 0.3-0.7 units/ml Monitor platelets by anticoagulation protocol: Yes   Plan:  Increase heparin drip to 1250 units/hr Check heparin level in 6 to 8 hours Monitor daily heparin levels and CBC Monitor for s/sx of bleeding  Thank you for involving pharmacy in this patient's care.  Loura Back, PharmD, BCPS Clinical Pharmacist Clinical phone for 03/27/2023 is [phone] 03/27/2023 6:39 PM

## 2023-03-27 NOTE — Progress Notes (Signed)
 Physical Therapy Treatment Patient Details Name: Gabrielle [name] MRN: [id number] DOB: May 24, 1955 Today's Date: 03/27/2023   History of Present Illness 68 y.o. female admitted on 03/23/2023 with L lower extremity critical limb ischemia. S/p  left iliofemoral endarterectomy with saphenous vein tibioperoneal trunk bypass requiring takeback for thrombectomy and revision of distal anastomosis.  PMH: smoking (40 pack years, quit 2016), NSCLC s/p right upper lobectomy 2016 (did not require chemo or radiation), asthma, emphysema, nocturnal hypoxemia on 2 to 3 L O2 at night.HTN, GERD, breast cancer s/p right lumpectomy and SLNB 2014    PT Comments  Pt up in room on arrival and agreeable to session. Pt continues to fatigue quickly requiring seated rest break prior to gait and x3 seated rest breaks during hallway distance gait trial. Pt requiring grossly CGA for bed mobility, transfers and gait with rollator support with cues for rollator safety for locking brakes prior to sit/stand. Pt continues to benefit from skilled PT services to progress toward functional mobility goals.     If plan is discharge home, recommend the following: A little help with walking and/or transfers;A little help with bathing/dressing/bathroom;Assistance with cooking/housework;Assist for transportation;Help with stairs or ramp for entrance   Can travel by private vehicle        Equipment Recommendations  Rollator (4 wheels);BSC/3in1    Recommendations for Other Services       Precautions / Restrictions Precautions Precautions: Fall;Other (comment) Precaution/Restrictions Comments: monitor O2 Restrictions Weight Bearing Restrictions Per Provider Order: No     Mobility  Bed Mobility Overal bed mobility: Needs Assistance Bed Mobility: Sit to Supine       Sit to supine: Contact guard assist   General bed mobility comments: CGA for safety, increased time to complete    Transfers Overall transfer level: Needs  assistance Equipment used: Rollator (4 wheels) Transfers: Sit to/from Stand Sit to Stand: Contact guard assist           General transfer comment: Assist for safety and lines, cues to lock breaks prior to sit/stand    Ambulation/Gait Ambulation/Gait assistance: Contact guard assist Gait Distance (Feet): 200 Feet Assistive device: Rollator (4 wheels) Gait Pattern/deviations: Step-through pattern, Decreased stride length, Decreased stance time - left Gait velocity: decr     General Gait Details: Assist for safety and lines, x3 seated rest breaks on rollator seat due to fatigue and SOB   Stairs             Wheelchair Mobility     Tilt Bed    Modified Rankin (Stroke Patients Only)       Balance Overall balance assessment: Needs assistance Sitting-balance support: Feet unsupported, No upper extremity supported Sitting balance-Leahy Scale: Fair     Standing balance support: Bilateral upper extremity supported, During functional activity, Reliant on assistive device for balance Standing balance-Leahy Scale: Poor Standing balance comment: UE support                            Communication Communication Communication: No apparent difficulties  Cognition Arousal: Alert Behavior During Therapy: WFL for tasks assessed/performed   PT - Cognitive impairments: No apparent impairments                       PT - Cognition Comments: repetition for rollator safety Following commands: Intact Following commands impaired: Follows multi-step commands with increased time    Cueing Cueing Techniques: Verbal cues  Exercises  General Comments General comments (skin integrity, edema, etc.): VSS on RA      Pertinent Vitals/Pain Pain Assessment Pain Assessment: Faces Faces Pain Scale: Hurts a little bit Pain Location: L LE/groin with movement Pain Descriptors / Indicators: Grimacing, Guarding, Sore Pain Intervention(s): Monitored during  session, Limited activity within patient's tolerance    Home Living                          Prior Function            PT Goals (current goals can now be found in the care plan section) Acute Rehab PT Goals PT Goal Formulation: With patient Time For Goal Achievement: 04/07/23 Progress towards PT goals: Progressing toward goals    Frequency    Min 1X/week      PT Plan      Co-evaluation              AM-PAC PT "6 Clicks" Mobility   Outcome Measure  Help needed turning from your back to your side while in a flat bed without using bedrails?: A Little Help needed moving from lying on your back to sitting on the side of a flat bed without using bedrails?: A Little Help needed moving to and from a bed to a chair (including a wheelchair)?: A Little Help needed standing up from a chair using your arms (e.g., wheelchair or bedside chair)?: A Little Help needed to walk in hospital room?: A Little Help needed climbing 3-5 steps with a railing? : A Lot 6 Click Score: 17    End of Session Equipment Utilized During Treatment: Gait belt Activity Tolerance: Patient tolerated treatment well Patient left: with call bell/phone within reach;in bed Nurse Communication: Mobility status PT Visit Diagnosis: Muscle weakness (generalized) (M62.81);Difficulty in walking, not elsewhere classified (R26.2);Pain;Other abnormalities of gait and mobility (R26.89);Unsteadiness on feet (R26.81) Pain - Right/Left: Left Pain - part of body: Hip     Time: 1610-9604 PT Time Calculation (min) (ACUTE ONLY): 31 min  Charges:    $Gait Training: 23-37 mins PT General Charges $$ ACUTE PT VISIT: 1 Visit                     Otha Monical R. PTA Acute Rehabilitation Services Office: [phone]   Catalina Antigua 03/27/2023, 3:50 PM

## 2023-03-27 NOTE — Progress Notes (Signed)
 PHARMACY - ANTICOAGULATION CONSULT NOTE  Pharmacy Consult for Heparin Indication: DVT  Allergies  Allergen Reactions   Hydrocodone Other (See Comments)    Makes her hyper   Bactrim [Sulfamethoxazole-Trimethoprim] Other (See Comments)    hyponatremia    Patient Measurements: Height: 5\' 1"  (154.9 cm) Weight: 71.6 kg (157 lb 13.6 oz) IBW/kg (Calculated) : 47.8 Heparin Dosing Weight: 64.2 kg  Vital Signs: Temp: 98.5 F (36.9 C) (03/04 0814) Temp Source: Oral (03/04 0814) BP: 115/89 (03/04 0814) Pulse Rate: 95 (03/04 0814)  Labs: Recent Labs    03/24/23 1224 03/24/23 1539 03/24/23 1659 03/24/23 1659 03/25/23 0503 03/26/23 0416 03/26/23 1630 03/27/23 0352  HGB  --   --  8.6*   < >  --  7.6* 9.1*  --   HCT  --   --  25.2*  --   --  22.5* 26.0*  --   PLT  --   --   --   --   --  103* 102*  --   HEPARINUNFRC  --   --   --   --  <0.10* <0.10*  --   --   CREATININE  --   --   --   --   --  0.55  --  0.61  CKTOTAL 264*  --   --   --   --   --   --   --   TROPONINIHS 11 12  --   --   --   --   --   --    < > = values in this interval not displayed.    Estimated Creatinine Clearance: 63.4 mL/min (by C-G formula based on SCr of 0.61 mg/dL).   Medical History: Past Medical History:  Diagnosis Date   Anemia    Arthritis    Asthma    Carpal tunnel syndrome    bil   Carpal tunnel syndrome    Chronic pain    Chronic pain in left foot    Complication of anesthesia    states mederate sedation did now worke, was awake   Cough    Depression    espisodic   Drug abuse (HCC)    Dyspnea    with exertion an have failed over night oximenrty - wears oxygen at hs   GERD (gastroesophageal reflux disease)    H/O eating disorder    history of Substance abuse (HCC)    Substance abuse in the past.     Hyperlipidemia    Hypertension    Invasive ductal carcinoma of right breast (HCC) 08/21/2012   Right   Legally blind    Lung cancer (HCC) 2016   Orthostatic hypotension     Pneumonia    03-13-17   S/P bilateral breast implants 08/21/2012   Prior to cancer diagnosis    Sleep apnea    Wears 2-3L oxygen at night   Tobacco abuse 08/21/2012   > 40 pack years     Assessment: 68 yo with left lower extremity critical and ischemic tissue loss. S/p Left iliofemoral endarterectomy, embolectomy, extended onto the profunda with greater saphenous vein patch Left greater saphenous vein harvest Left femoral to tibioperoneal trunk bypass using reverse greater saphenous vein  Patient has been on fixed dose heparin since surgery. Pharmacy consulted to start therapeutic dose heparin  Goal of Therapy:  Heparin level 0.3-0.7 units/ml Monitor platelets by anticoagulation protocol: Yes   Plan:  Increase Heparin to 1050 units/hr Check heparin level in 6  to 8 hours Monitor daily heparin levels and CBC  Jeanella Cara, PharmD, [facility] Clinical Pharmacist Please see AMION for all Pharmacists' Contact Phone Numbers 03/27/2023, 9:59 AM

## 2023-03-27 NOTE — TOC Initial Note (Signed)
 Transition of Care (TOC) - Initial/Assessment Note  Donn Pierini RN, BSN Transitions of Care Unit 4E- RN Case Manager See Treatment Team for direct phone #   Patient Details  Name: Gabrielle [name] MRN: [id number] Date of Birth: August 02, 1955  Transition of Care [facility]) CM/SW Contact:    Darrold Span, RN Phone Number: 03/27/2023, 1:51 PM  Clinical Narrative:                 Cm spoke with pt at bedside regarding HH and DME needs. Orders placed for HHPT/OT.   List provided for [facility] choice Per CMS guidelines from PhoneFinancing.pl website with star ratings (copy placed in shadow chart)- pt voiced she does not have a preference other than looking at 3+ star rated and above agencies as first preference.   Pt states she would like rollator for home, but voiced she does not need BSC at this time. She will follow up on getting shower chair herself.   Address, phone # and PCP all confirmed, Pt states she will have transportation home.   CM called [employer] for delivery of rollator to room prior to discharge.   CM reaching out to [facility] agencies to secure needed PTOT services [facility]- referral pending  Expected Discharge Plan: Home w Home Health Services Barriers to Discharge: Continued Medical Work up   Patient Goals and CMS Choice Patient states their goals for this hospitalization and ongoing recovery are:: return home CMS Medicare.gov Compare Post Acute Care list provided to:: Patient Choice offered to / list presented to : Patient      Expected Discharge Plan and Services   Discharge Planning Services: CM Consult Post Acute Care Choice: Home Health, Durable Medical Equipment Living arrangements for the past 2 months: Single Family Home                 DME Arranged: Walker rolling with seat DME Agency: [facility] Date DME Agency Contacted: 03/27/23 Time DME Agency Contacted: 1350 Representative spoke with at DME Agency: Ian Malkin HH Arranged: PT, OT           Prior Living Arrangements/Services Living arrangements for the past 2 months: Single Family Home Lives with:: Self Patient language and need for interpreter reviewed:: Yes Do you feel safe going back to the place where you live?: Yes      Need for Family Participation in Patient Care: Yes (Comment) Care giver support system in place?: Yes (comment) Current home services: DME Criminal Activity/Legal Involvement Pertinent to Current Situation/Hospitalization: No - Comment as needed  Activities of Daily Living   ADL Screening (condition at time of admission) Independently performs ADLs?: Yes (appropriate for developmental age) Is the patient deaf or have difficulty hearing?: Yes (Slightly) Does the patient have difficulty seeing, even when wearing glasses/contacts?: No Does the patient have difficulty concentrating, remembering, or making decisions?: Yes (Mild memory issues at times per patient)  Permission Sought/Granted Permission sought to share information with : Facility Industrial/product designer granted to share information with : Yes, Verbal Permission Granted              Emotional Assessment Appearance:: Appears stated age Attitude/Demeanor/Rapport: Engaged Affect (typically observed): Accepting Orientation: : Oriented to Self, Oriented to Place, Oriented to  Time, Oriented to Situation Alcohol / Substance Use: Not Applicable Psych Involvement: No (comment)  Admission diagnosis:  Critical lower limb ischemia [facility]) [I70.229] Patient Active Problem List   Diagnosis Date Noted   Ingrown nail of great toe of left foot  03/26/2023   Critical lower limb ischemia (HCC) 03/23/2023   Anxiety 09/15/2020   Hyperlipidemia LDL goal <70 03/26/2020   Morbid obesity (HCC) 03/26/2020   Neuropathy 03/26/2020   Osteopenia after menopause 06/13/2018   Insomnia 11/03/2016   Asthma 01/06/2016   Lung cancer, right upper lobe 04/24/2014   S/P lobectomy of lung 04/20/2014    Hyponatremia 08/20/2013   Invasive ductal carcinoma of right breast (HCC) 08/21/2012   Former smoker 08/21/2012   Primary hypertension 04/29/2008   GERD 04/29/2008   PCP:  Ignatius Specking, MD Pharmacy:   [facility] [phone] - [place], [place] - [address] AT [facility] [address] [place] [address] Phone: [phone] Fax: [phone]     Social Drivers of Health (SDOH) Social History: SDOH Screenings   Food Insecurity: No Food Insecurity (03/24/2023)  Housing: Low Risk  (03/24/2023)  Transportation Needs: No Transportation Needs (03/24/2023)  Utilities: Not At Risk (03/24/2023)  Depression (PHQ2-9): Low Risk  (05/06/2021)  Financial Resource Strain: Low Risk  (06/15/2017)   [facility], [facility]  Physical Activity: Sufficiently Active (06/15/2017)   Received from [facility], [facility]  Social Connections: Moderately Isolated (03/25/2023)  Stress: No Stress Concern Present (06/15/2017)   [facility] from [facility], [facility]  Tobacco Use: Medium Risk (03/23/2023)  Health Literacy: Low Risk  (12/28/2022)   Received from [facility]   SDOH Interventions:     Readmission Risk Interventions     No data to display

## 2023-03-27 NOTE — Plan of Care (Signed)
  Problem: Clinical Measurements: Goal: Respiratory complications will improve Outcome: Progressing   Problem: Clinical Measurements: Goal: Ability to maintain clinical measurements within normal limits will improve Outcome: Progressing Goal: Will remain free from infection Outcome: Progressing Goal: Diagnostic test results will improve Outcome: Progressing Goal: Respiratory complications will improve Outcome: Progressing Goal: Cardiovascular complication will be avoided Outcome: Progressing   Problem: Coping: Goal: Level of anxiety will decrease Outcome: Progressing   Problem: Nutrition: Goal: Adequate nutrition will be maintained Outcome: Progressing   Problem: Skin Integrity: Goal: Risk for impaired skin integrity will decrease Outcome: Progressing

## 2023-03-27 NOTE — Progress Notes (Addendum)
  Progress Note    03/27/2023 8:43 AM 4 Days Post-Op  Subjective:  left groin very tender   Vitals:   03/27/23 0354 03/27/23 0814  BP: (!) 158/67 115/89  Pulse: 93 95  Resp: 20 18  Temp: 98.1 F (36.7 C) 98.5 F (36.9 C)  SpO2: 96% 95%   Physical Exam: Cardiac:  regular Lungs:  on 3L [place] Incisions:  LLE incisions are all intact and dry. Dry gauze applied to left groin to wick moisture. Unroofed blister in left groin area very tender, some SS drainage Extremities:  LLE well perfused and warm with doppler PT/ DP signals Abdomen:  soft Neurologic: alert and oriented  CBC    Component Value Date/Time   WBC 9.3 03/26/2023 1630   RBC 2.71 (L) 03/26/2023 1630   HGB 9.1 (L) 03/26/2023 1630   HCT 26.0 (L) 03/26/2023 1630   PLT 102 (L) 03/26/2023 1630   MCV 95.9 03/26/2023 1630   MCH 33.6 03/26/2023 1630   MCHC 35.0 03/26/2023 1630   RDW 17.6 (H) 03/26/2023 1630   LYMPHSABS 3.0 12/10/2020 0908   MONOABS 0.6 12/10/2020 0908   EOSABS 0.2 12/10/2020 0908   BASOSABS 0.1 12/10/2020 0908    BMET    Component Value Date/Time   NA 133 (L) 03/27/2023 0352   K 3.4 (L) 03/27/2023 0352   CL 99 03/27/2023 0352   CO2 25 03/27/2023 0352   GLUCOSE 101 (H) 03/27/2023 0352   BUN <5 (L) 03/27/2023 0352   CREATININE 0.61 03/27/2023 0352   CREATININE 0.73 03/26/2020 1520   CALCIUM 7.6 (L) 03/27/2023 0352   GFRNONAA >60 03/27/2023 0352   GFRAA >60 06/26/2017 1043    INR    Component Value Date/Time   INR 1.0 03/21/2023 1300     Intake/Output Summary (Last 24 hours) at 03/27/2023 0843 Last data filed at 03/27/2023 0800 Gross per 24 hour  Intake 1659.23 ml  Output 1050 ml  Net 609.23 ml     Assessment/Plan:  68 y.o. female is s/p  left iliofemoral endarterectomy and left femoral to TPT bypass, with takeback profunda thrombectomy, re-do proximal and distal anastomosis, and TPT endarterectomy 4 Days Post Op   LLE well perfused and warm with doppler PT/ DP signals Left groin and  leg incisions are intact and dry. Large blister unroofed in left groin with some drainage. Dry gauze placed in left groin with ABD TID dressing changes to left groin to keep groin dry ACE to left lower leg to help with edema Encourage leg elevation when in bed or chair H&H stable post transfusion of 1 unit of PRBC yesterday Anticipate d/c in next day or 2 if she continues to progress well HH PT/ OT orders placed Mobilize as tolerated   Graceann Congress, PA-C [facility] [phone] 03/27/2023 8:43 AM  VASCULAR STAFF ADDENDUM: I have independently interviewed and examined the patient. I agree with the above.  Overall doing well.  Vac removed - unable to hold suction due to blistering from prior adhesive  Incisions dry Dry dressings to wounds  Leg wrapped below the knee  Good signals.  Will transition to therapeutic AC today. [place] tomorrow, home possibly tomorrow if she feels up to it.   Victorino Sparrow MD [facility] Phone Number: [phone] 03/27/2023 10:59 AM

## 2023-03-28 ENCOUNTER — Other Ambulatory Visit (HOSPITAL_COMMUNITY): Payer: Self-pay

## 2023-03-28 ENCOUNTER — Telehealth (HOSPITAL_COMMUNITY): Payer: Self-pay | Admitting: Pharmacy Technician

## 2023-03-28 LAB — CBC
HCT: 23.5 % — ABNORMAL LOW (ref 36.0–46.0)
Hemoglobin: 8.1 g/dL — ABNORMAL LOW (ref 12.0–15.0)
MCH: 33.1 pg (ref 26.0–34.0)
MCHC: 34.5 g/dL (ref 30.0–36.0)
MCV: 95.9 fL (ref 80.0–100.0)
Platelets: 122 10*3/uL — ABNORMAL LOW (ref 150–400)
RBC: 2.45 MIL/uL — ABNORMAL LOW (ref 3.87–5.11)
RDW: 16.3 % — ABNORMAL HIGH (ref 11.5–15.5)
WBC: 6.8 10*3/uL (ref 4.0–10.5)
nRBC: 0 % (ref 0.0–0.2)

## 2023-03-28 LAB — HEPARIN LEVEL (UNFRACTIONATED)
Heparin Unfractionated: 0.14 [IU]/mL — ABNORMAL LOW (ref 0.30–0.70)
Heparin Unfractionated: 0.25 [IU]/mL — ABNORMAL LOW (ref 0.30–0.70)
Heparin Unfractionated: 0.29 [IU]/mL — ABNORMAL LOW (ref 0.30–0.70)

## 2023-03-28 MED ORDER — FUROSEMIDE 20 MG PO TABS
20.0000 mg | ORAL_TABLET | Freq: Every day | ORAL | Status: DC
  Administered 2023-03-28 – 2023-03-29 (×2): 20 mg via ORAL
  Filled 2023-03-28 (×2): qty 1

## 2023-03-28 MED ORDER — POTASSIUM CHLORIDE CRYS ER 20 MEQ PO TBCR
40.0000 meq | EXTENDED_RELEASE_TABLET | Freq: Once | ORAL | Status: AC
  Administered 2023-03-28: 40 meq via ORAL
  Filled 2023-03-28: qty 2

## 2023-03-28 MED ORDER — FUROSEMIDE 20 MG PO TABS
20.0000 mg | ORAL_TABLET | Freq: Once | ORAL | Status: AC
Start: 2023-03-28 — End: 2023-03-28
  Administered 2023-03-28: 20 mg via ORAL
  Filled 2023-03-28: qty 1

## 2023-03-28 NOTE — Progress Notes (Signed)
 Mobility Specialist Progress Note:   03/28/23 0939  Mobility  Activity Ambulated with assistance to bathroom  Level of Assistance Modified independent, requires aide device or extra time  Assistive Device Other (Comment) (IV pole)  Distance Ambulated (ft) 25 ft  Activity Response Tolerated well  Mobility Referral Yes  Mobility visit 1 Mobility  Mobility Specialist Start Time (ACUTE ONLY) 0930  Mobility Specialist Stop Time (ACUTE ONLY) [id number]  Mobility Specialist Time Calculation (min) (ACUTE ONLY) 9 min   Pt received in bed, requesting assistance to ambulate to bathroom, then transfer to chair. ModI required with IV pole for support. Declined hallway ambulation at this time. Tolerated well, audible SOB throughout. Pt took off [place] to go to bathroom, SpO2 81% on RA, HR 107 bpm. Recovered, SpO2 97% on 3L in chair, HR 83 bpm. Pt sitting comfortably in chair with call bell and phone in reach, all needs met.   Feliciana Rossetti Mobility Specialist Please contact via Special educational needs teacher or  Rehab office at [phone]

## 2023-03-28 NOTE — Plan of Care (Signed)
  Problem: Clinical Measurements: Goal: Will remain free from infection Outcome: Progressing   Problem: Clinical Measurements: Goal: Diagnostic test results will improve Outcome: Progressing   Problem: Clinical Measurements: Goal: Respiratory complications will improve Outcome: Progressing   Problem: Elimination: Goal: Will not experience complications related to bowel motility Outcome: Progressing Goal: Will not experience complications related to urinary retention Outcome: Progressing   Problem: Education: Goal: Knowledge of prescribed regimen will improve Outcome: Progressing   Problem: Activity: Goal: Ability to tolerate increased activity will improve Outcome: Progressing

## 2023-03-28 NOTE — Care Management Important Message (Signed)
 Important Message  Patient Details  Name: Gabrielle [name] MRN: [id number] Date of Birth: 24-Apr-1955   Important Message Given:  Yes - Medicare IM     Renie Ora 03/28/2023, 11:22 AM

## 2023-03-28 NOTE — Progress Notes (Signed)
 Occupational Therapy Treatment Patient Details Name: Gabrielle [name] MRN: [id number] DOB: [birth year] Today's Date: 03/28/2023   History of present illness 68 y.o. female admitted on 03/23/2023 with L lower extremity critical limb ischemia. S/p  left iliofemoral endarterectomy with saphenous vein tibioperoneal trunk bypass requiring takeback for thrombectomy and revision of distal anastomosis.  PMH: smoking (40 pack years, quit 2016), NSCLC s/p right upper lobectomy 2016 (did not require chemo or radiation), asthma, emphysema, nocturnal hypoxemia on 2 to 3 L O2 at night.HTN, GERD, breast cancer s/p right lumpectomy and SLNB 2014   OT comments  Pt continues to fatigue with longer distance mobility, strongly benefits from use of Rollator to moderate her pace and take seated rest breaks prn, min cues to reinforce pursed lip breathing. Pt overall ambulating with CGA, continues to have RLE pain and needing some assist with LB ADLs. Reviewed stair negotiation with pt, she would benefit from further work on it with PT. OT to continue to progress pt as able, DC plans remain appropriate for [facility].       If plan is discharge home, recommend the following:  A little help with bathing/dressing/bathroom;Assistance with cooking/housework;Assist for transportation;Help with stairs or ramp for entrance   Equipment Recommendations  BSC/3in1;Other (comment) Aeronautical engineer)    Recommendations for Other Services      Precautions / Restrictions Precautions Precautions: Fall;Other (comment) Precaution/Restrictions Comments: monitor O2 Restrictions Weight Bearing Restrictions Per Provider Order: No       Mobility Bed Mobility Overal bed mobility: Needs Assistance Bed Mobility: Sit to Supine     Supine to sit: Supervision, Used rails, HOB elevated Sit to supine: Contact guard assist   General bed mobility comments: increased time to complete, educated pt on how to hook her LLE using the RLE if needed to assist  with lifting    Transfers Overall transfer level: Needs assistance Equipment used: Rollator (4 wheels) Transfers: Sit to/from Stand Sit to Stand: Contact guard assist           General transfer comment: cues needed for RW position against a supported surface when needing seated Rest breaks     Balance Overall balance assessment: Needs assistance Sitting-balance support: Feet unsupported, No upper extremity supported Sitting balance-Leahy Scale: Fair     Standing balance support: Bilateral upper extremity supported, During functional activity, Reliant on assistive device for balance Standing balance-Leahy Scale: Fair Standing balance comment: static standing                           ADL either performed or assessed with clinical judgement   ADL                                       Functional mobility during ADLs: Contact guard assist;Rolling walker (2 wheels) General ADL Comments: Pt applying her own ABD pad in between her groin. Reinforced energy conservation strategies. Educated pt on how to use a BSC for tub transfer-handout provided. 2 seated rest breaks prn during hall mobility, pt curious of stairs. Educated pt on proper foot placement to negotiate stairs, performed 4 step ups/step downs with rail, would benefit from further education with PT.    Extremity/Trunk Assessment              Occupational psychologist  Communication: No apparent difficulties   Cognition Arousal: Alert Behavior During Therapy: WFL for tasks assessed/performed Cognition: No apparent impairments                               Following commands: Intact        Cueing   Cueing Techniques: Verbal cues  Exercises      Shoulder Instructions       General Comments VSS on 3L, Sp02 >95%    Pertinent Vitals/ Pain       Pain Assessment Pain Assessment: Faces Faces Pain Scale: Hurts a little  bit Pain Location: L LE/groin with movement Pain Descriptors / Indicators: Grimacing, Guarding, Sore Pain Intervention(s): Monitored during session, Repositioned  Home Living                                          Prior Functioning/Environment              Frequency  Min 1X/week        Progress Toward Goals  OT Goals(current goals can now be found in the care plan section)     Acute Rehab OT Goals Patient Stated Goal: walk more OT Goal Formulation: With patient Time For Goal Achievement: 04/09/23 Potential to Achieve Goals: Good  Plan      Co-evaluation                 AM-PAC OT "6 Clicks" Daily Activity     Outcome Measure   Help from another person eating meals?: None Help from another person taking care of personal grooming?: A Little Help from another person toileting, which includes using toliet, bedpan, or urinal?: A Little Help from another person bathing (including washing, rinsing, drying)?: A Little Help from another person to put on and taking off regular upper body clothing?: A Little Help from another person to put on and taking off regular lower body clothing?: A Lot 6 Click Score: 18    End of Session Equipment Utilized During Treatment: Gait belt;Rolling walker (2 wheels);Oxygen (3L)  OT Visit Diagnosis: Unsteadiness on feet (R26.81);Muscle weakness (generalized) (M62.81)   Activity Tolerance Patient tolerated treatment well   Patient Left in chair;with call bell/phone within reach;with chair alarm set   Nurse Communication Mobility status        Time: 3329-5188 OT Time Calculation (min): 26 min  Charges: OT General Charges $OT Visit: 1 Visit OT Treatments $Therapeutic Activity: 23-37 mins  03/28/2023  AB, OTR/L  Acute Rehabilitation Services  Office: [phone]   Tristan Schroeder 03/28/2023, 5:23 PM

## 2023-03-28 NOTE — Progress Notes (Addendum)
 Progress Note    03/28/2023 6:47 AM 5 Days Post-Op  Subjective:  says she has figured out how to get the dressing off the blistered area.  Says her weight is up 20 lbs.  Has a lot of fluid in her left leg.    Tm 99.2 now afebrile HR 80's-100's  100's -160's systolic 100% 2LO2NC  Vitals:   03/27/23 2320 03/28/23 0309  BP: 101/62 (!) 160/80  Pulse: 96 91  Resp: 18 19  Temp: 99.2 F (37.3 C) 97.8 F (36.6 C)  SpO2: 96% 99%    Physical Exam: General:  no distress Cardiac:  regular Lungs:  non labored Incisions:  left groin incision with staples in tact; blistered area proximal to this from vac adhesive.  Ace removed from lower leg and incision looks fine.  Extremities:  brisk left DP/PT doppler flow; calf mildly tender but extremely soft in the calf and anterior compartments.   CBC    Component Value Date/Time   WBC 6.8 03/28/2023 0142   RBC 2.45 (L) 03/28/2023 0142   HGB 8.1 (L) 03/28/2023 0142   HCT 23.5 (L) 03/28/2023 0142   PLT 122 (L) 03/28/2023 0142   MCV 95.9 03/28/2023 0142   MCH 33.1 03/28/2023 0142   MCHC 34.5 03/28/2023 0142   RDW 16.3 (H) 03/28/2023 0142   LYMPHSABS 3.0 12/10/2020 0908   MONOABS 0.6 12/10/2020 0908   EOSABS 0.2 12/10/2020 0908   BASOSABS 0.1 12/10/2020 0908    BMET    Component Value Date/Time   NA 133 (L) 03/27/2023 0352   K 3.4 (L) 03/27/2023 0352   CL 99 03/27/2023 0352   CO2 25 03/27/2023 0352   GLUCOSE 101 (H) 03/27/2023 0352   BUN <5 (L) 03/27/2023 0352   CREATININE 0.61 03/27/2023 0352   CREATININE 0.73 03/26/2020 1520   CALCIUM 7.6 (L) 03/27/2023 0352   GFRNONAA >60 03/27/2023 0352   GFRAA >60 06/26/2017 1043    INR    Component Value Date/Time   INR 1.0 03/21/2023 1300     Intake/Output Summary (Last 24 hours) at 03/28/2023 0647 Last data filed at 03/28/2023 0446 Gross per 24 hour  Intake 1202.03 ml  Output --  Net 1202.03 ml      Assessment/Plan:  68 y.o. female is s/p:    left iliofemoral  endarterectomy and left femoral to TPT bypass, with takeback profunda thrombectomy, re-do proximal and distal anastomosis, and TPT endarterectomy    -pt with brisk doppler flow left foot.   -weight is up 20 lbs with edema left leg.  May benefit from gentle diuresis.   -received PRBC with improvement in her hgb but decreased again this am to 8.1 from 9.1 two days ago.   -given the above, may need another day to check labs tomorrow and diurese.  Will defer to Dr. Karin Lieu.   -DVT prophylaxis:  heparin gtt   Doreatha Massed, PA-C [facility] [phone] 03/28/2023 6:47 AM  VASCULAR STAFF ADDENDUM: I have independently interviewed and examined the patient. I agree with the above.  Overall happy with how Adilenne is progressing. Excellent signal in the foot.  I have added 20 mg of Lasix orally for the next 3 days No plan for further blood administration, believe most of this is due to fluid. Will plan on discharge tomorrow.  Continue heparin drip. Apartment as tolerated, dry dressings togroin. Leg wrapped below the knee.  Victorino Sparrow MD [facility] of [facility] Phone Number: [phone]  [phone] 03/28/2023 8:03 AM

## 2023-03-28 NOTE — Telephone Encounter (Signed)
 Patient Product/process development scientist completed.    The patient is insured through [employer]/ [employer]. Patient has Medicare and is not eligible for a copay card, but may be able to apply for patient assistance or Medicare RX Payment Plan (Patient Must reach out to their plan, if eligible for payment plan), if available.    Ran test claim for Eliquis 5 mg and the current 30 day co-pay is $47.00.  Ran test claim for Xarelto 20 mg and the current 30 day co-pay is $47.00.  This test claim was processed through [facility]- copay amounts may vary at other pharmacies due to pharmacy/plan contracts, or as the patient moves through the different stages of their insurance plan.     Roland Earl, CPHT Pharmacy Technician III Certified Patient Advocate [facility] Pharmacy Patient Advocate Team Direct Number: [phone]  Fax: [phone]

## 2023-03-28 NOTE — Progress Notes (Signed)
 PHARMACY - ANTICOAGULATION CONSULT NOTE  Pharmacy Consult for Heparin Indication: DVT  Allergies  Allergen Reactions   Hydrocodone Other (See Comments)    Makes her hyper   Bactrim [Sulfamethoxazole-Trimethoprim] Other (See Comments)    hyponatremia    Patient Measurements: Height: 5\' 1"  (154.9 cm) Weight: 71.6 kg (157 lb 13.6 oz) IBW/kg (Calculated) : 47.8 Heparin Dosing Weight: 64.2 kg  Vital Signs: Temp: 99.2 F (37.3 C) (03/04 2320) Temp Source: Axillary (03/04 2320) BP: 101/62 (03/04 2320) Pulse Rate: 96 (03/04 2320)  Labs: Recent Labs    03/26/23 0416 03/26/23 1630 03/27/23 0352 03/27/23 1700 03/28/23 0142  HGB 7.6* 9.1*  --   --  8.1*  HCT 22.5* 26.0*  --   --  23.5*  PLT 103* 102*  --   --  122*  HEPARINUNFRC <0.10*  --   --  <0.10* 0.14*  CREATININE 0.55  --  0.61  --   --     Estimated Creatinine Clearance: 63.4 mL/min (by C-G formula based on SCr of 0.61 mg/dL).  Assessment: 69 yo with left lower extremity critical and ischemic tissue loss. S/p Left iliofemoral endarterectomy, embolectomy, extended onto the profunda with greater saphenous vein patch Left greater saphenous vein harvest Left femoral to tibioperoneal trunk bypass using reverse greater saphenous vein  Patient has been on fixed dose heparin since surgery. Pharmacy consulted to start therapeutic dose heparin.  Heparin level is subtherapeutic at 0.14 on 1250 units/hr. No bleeding or infusion issues per RN. CBC shows Hgb 9.1 > 8.1 and plts 122  Goal of Therapy:  Heparin level 0.3-0.7 units/ml Monitor platelets by anticoagulation protocol: Yes   Plan:  Increase heparin drip to 1500 units/hr Check heparin level in 6 hours Monitor daily heparin levels and CBC Monitor for s/sx of bleeding  Thank you for involving pharmacy in this patient's care.  Arabella Merles, PharmD. Clinical Pharmacist 03/28/2023 2:11 AM

## 2023-03-28 NOTE — Progress Notes (Signed)
 PHARMACY - ANTICOAGULATION CONSULT NOTE  Pharmacy Consult for Heparin Indication: DVT  Allergies  Allergen Reactions   Hydrocodone Other (See Comments)    Makes her hyper   Bactrim [Sulfamethoxazole-Trimethoprim] Other (See Comments)    hyponatremia    Patient Measurements: Height: 5\' 1"  (154.9 cm) Weight: 71.6 kg (157 lb 13.6 oz) IBW/kg (Calculated) : 47.8 Heparin Dosing Weight: 64.2 kg  Vital Signs: Temp: 98.1 F (36.7 C) (03/05 1604) Temp Source: Axillary (03/05 1604) BP: 154/87 (03/05 1604) Pulse Rate: 88 (03/05 1604)  Labs: Recent Labs    03/26/23 0416 03/26/23 1630 03/27/23 0352 03/27/23 1700 03/28/23 0142 03/28/23 0900 03/28/23 1630  HGB 7.6* 9.1*  --   --  8.1*  --   --   HCT 22.5* 26.0*  --   --  23.5*  --   --   PLT 103* 102*  --   --  122*  --   --   HEPARINUNFRC <0.10*  --   --    < > 0.14* 0.25* 0.29*  CREATININE 0.55  --  0.61  --   --   --   --    < > = values in this interval not displayed.    Estimated Creatinine Clearance: 63.4 mL/min (by C-G formula based on SCr of 0.61 mg/dL).  Assessment: 68 yo with left lower extremity critical and ischemic tissue loss. S/p Left iliofemoral endarterectomy, embolectomy, extended onto the profunda with greater saphenous vein patch Left greater saphenous vein harvest Left femoral to tibioperoneal trunk bypass using reverse greater saphenous vein  Patient has been on fixed dose heparin since surgery. Pharmacy consulted to start therapeutic dose heparin.  Heparin level is subtherapeutic at 0.29 on 1650 units/hr. No bleeding or infusion issues per RN. Hgb did drop ~1 g/dL. vascular is aware. Believed to be from fluid loss.  Goal of Therapy:  Heparin level 0.3-0.7 units/ml Monitor platelets by anticoagulation protocol: Yes   Plan:  Increase heparin drip to 1800 units/hr Monitor daily heparin levels and CBC Monitor for s/sx of bleeding May discharge on 3/6  Thank you for involving pharmacy in this  patient's care.  Loura Back, PharmD, BCPS Clinical Pharmacist Clinical phone for 03/28/2023 is [phone] 03/28/2023 5:42 PM

## 2023-03-28 NOTE — Progress Notes (Signed)
 PHARMACY - ANTICOAGULATION CONSULT NOTE  Pharmacy Consult for Heparin Indication: DVT  Allergies  Allergen Reactions   Hydrocodone Other (See Comments)    Makes her hyper   Bactrim [Sulfamethoxazole-Trimethoprim] Other (See Comments)    hyponatremia    Patient Measurements: Height: 5\' 1"  (154.9 cm) Weight: 71.6 kg (157 lb 13.6 oz) IBW/kg (Calculated) : 47.8 Heparin Dosing Weight: 64.2 kg  Vital Signs: Temp: 98.5 F (36.9 C) (03/05 0744) Temp Source: Oral (03/05 0744) BP: 127/72 (03/05 0744) Pulse Rate: 87 (03/05 0744)  Labs: Recent Labs    03/26/23 0416 03/26/23 1630 03/27/23 0352 03/27/23 1700 03/28/23 0142 03/28/23 0900  HGB 7.6* 9.1*  --   --  8.1*  --   HCT 22.5* 26.0*  --   --  23.5*  --   PLT 103* 102*  --   --  122*  --   HEPARINUNFRC <0.10*  --   --  <0.10* 0.14* 0.25*  CREATININE 0.55  --  0.61  --   --   --     Estimated Creatinine Clearance: 63.4 mL/min (by C-G formula based on SCr of 0.61 mg/dL).  Assessment: 68 yo with left lower extremity critical and ischemic tissue loss. S/p Left iliofemoral endarterectomy, embolectomy, extended onto the profunda with greater saphenous vein patch Left greater saphenous vein harvest Left femoral to tibioperoneal trunk bypass using reverse greater saphenous vein  Patient has been on fixed dose heparin since surgery. Pharmacy consulted to start therapeutic dose heparin.  Heparin level is subtherapeutic at 0.14 on 1250 units/hr. No bleeding or infusion issues per RN. CBC shows Hgb 9.1 > 8.1 and plts 122  3/5 AM update: HL 0.25 Hgb 8.1 / PLT 122K. Hgb did drop ~1 G/dL. vascular is aware. Believed to be from fluid loss No signs of bleeding or issues with the heparin gtt  Goal of Therapy:  Heparin level 0.3-0.7 units/ml Monitor platelets by anticoagulation protocol: Yes   Plan:  Increase heparin drip to 1650 units/hr Check heparin level in 8 hours Monitor daily heparin levels and CBC Monitor for s/sx of  bleeding  Thank you for involving pharmacy in this patient's care.    Greta Doom BS, PharmD, BCPS Clinical Pharmacist 03/28/2023 10:18 AM  Contact: [phone] after 3 PM  "Be curious, not judgmental..." -Debbora Dus

## 2023-03-28 NOTE — Progress Notes (Signed)
 Pt stated that the are on her left inner thigh near the incision site felt numb there.  Will continue to monitor. She also states she wants to ask the doctor today about restarting her metoprolol and lisinopril today, since she has not taken them while she's been here.  Harriet Masson, RN

## 2023-03-29 ENCOUNTER — Other Ambulatory Visit (HOSPITAL_COMMUNITY): Payer: Self-pay

## 2023-03-29 LAB — CBC
HCT: 23 % — ABNORMAL LOW (ref 36.0–46.0)
Hemoglobin: 7.8 g/dL — ABNORMAL LOW (ref 12.0–15.0)
MCH: 32.5 pg (ref 26.0–34.0)
MCHC: 33.9 g/dL (ref 30.0–36.0)
MCV: 95.8 fL (ref 80.0–100.0)
Platelets: 184 10*3/uL (ref 150–400)
RBC: 2.4 MIL/uL — ABNORMAL LOW (ref 3.87–5.11)
RDW: 15.7 % — ABNORMAL HIGH (ref 11.5–15.5)
WBC: 6.7 10*3/uL (ref 4.0–10.5)
nRBC: 0 % (ref 0.0–0.2)

## 2023-03-29 LAB — BASIC METABOLIC PANEL
Anion gap: 8 (ref 5–15)
BUN: <5 mg/dL — ABNORMAL LOW (ref 8–23)
CO2: 29 mmol/L (ref 22–32)
Calcium: 8.5 mg/dL — ABNORMAL LOW (ref 8.9–10.3)
Chloride: 98 mmol/L (ref 98–111)
Creatinine, Ser: 0.6 mg/dL (ref 0.44–1.00)
GFR, Estimated: >60 mL/min (ref >60)
Glucose, Bld: 95 mg/dL (ref 70–99)
Potassium: 3.5 mmol/L (ref 3.5–5.1)
Sodium: 135 mmol/L (ref 135–145)

## 2023-03-29 LAB — HEPARIN LEVEL (UNFRACTIONATED)
Heparin Unfractionated: 0.53 [IU]/mL (ref 0.30–0.70)
Heparin Unfractionated: 0.66 [IU]/mL (ref 0.30–0.70)

## 2023-03-29 LAB — MAGNESIUM: Magnesium: 1.4 mg/dL — ABNORMAL LOW (ref 1.7–2.4)

## 2023-03-29 MED ORDER — APIXABAN 5 MG PO TABS
5.0000 mg | ORAL_TABLET | Freq: Two times a day (BID) | ORAL | 2 refills | Status: DC
Start: 2023-03-29 — End: 2023-06-02
  Filled 2023-03-29: qty 60, 30d supply, fill #0

## 2023-03-29 MED ORDER — POTASSIUM CHLORIDE CRYS ER 10 MEQ PO TBCR
10.0000 meq | EXTENDED_RELEASE_TABLET | Freq: Two times a day (BID) | ORAL | 0 refills | Status: DC
Start: 2023-03-30 — End: 2023-05-28
  Filled 2023-03-29: qty 3, 2d supply, fill #0

## 2023-03-29 MED ORDER — APIXABAN 5 MG PO TABS
5.0000 mg | ORAL_TABLET | Freq: Two times a day (BID) | ORAL | Status: DC
  Administered 2023-03-29: 5 mg via ORAL
  Filled 2023-03-29: qty 1

## 2023-03-29 MED ORDER — OXYCODONE-ACETAMINOPHEN 5-325 MG PO TABS
1.0000 | ORAL_TABLET | Freq: Four times a day (QID) | ORAL | 0 refills | Status: DC | PRN
Start: 2023-03-29 — End: 2023-05-28
  Filled 2023-03-29: qty 28, 7d supply, fill #0

## 2023-03-29 MED ORDER — FUROSEMIDE 20 MG PO TABS
20.0000 mg | ORAL_TABLET | Freq: Every day | ORAL | 0 refills | Status: DC
  Filled 2023-03-29: qty 3, 3d supply, fill #0

## 2023-03-29 NOTE — Discharge Instructions (Addendum)
 [facility]  Discharge instructions  Lower Extremity Bypass Surgery  Please refer to the following instruction for your post-procedure care. Your surgeon or physician assistant will discuss any changes with you.  Activity  You are encouraged to walk as much as you can. You can slowly return to normal activities during the month after your surgery. Avoid strenuous activity and heavy lifting until your doctor tells you it's OK. Avoid activities such as vacuuming or swinging a golf club. Do not drive until your doctor give the OK and you are no longer taking prescription pain medications. It is also normal to have difficulty with sleep habits, eating and bowel movement after surgery. These will go away with time.  Okay to return to work April 24, 2023.  Bathing/Showering  Shower daily after you go home. Do not soak in a bathtub, hot tub, or swim until the incision heals completely.  Incision Care  Clean your incision with mild soap and water. Shower every day. Pat the area dry with a clean towel. You do not need a bandage unless otherwise instructed. Do not apply any ointments or creams to your incision. If you have open wounds you will be instructed how to care for them or a visiting nurse may be arranged for you. If you have staples or sutures along your incision they will be removed at your post-op appointment. You may have skin glue on your incision. Do not peel it off. It will come off on its own in about one week.  Wash the groin wound with soap and water daily and pat dry. (No tub bath-only shower)  Then put a dry gauze or washcloth in the groin to keep this area dry to help prevent wound infection.  Do this daily and as needed.  Do not use Vaseline or neosporin on your incisions.  Only use soap and water on your incisions and then protect and keep dry.  Diet  Resume your normal diet. There are no special food restrictions following this procedure. A  low fat/ low cholesterol diet is recommended for all patients with vascular disease. In order to heal from your surgery, it is CRITICAL to get adequate nutrition. Your body requires vitamins, minerals, and protein. Vegetables are the best source of vitamins and minerals. Vegetables also provide the perfect balance of protein. Processed food has little nutritional value, so try to avoid this.  Medications  Resume taking all your medications unless your doctor or physician assistant tells you not to. If your incision is causing pain, you may take over-the-counter pain relievers such as acetaminophen (Tylenol). If you were prescribed a stronger pain medication, please aware these medication can cause nausea and constipation. Prevent nausea by taking the medication with a snack or meal. Avoid constipation by drinking plenty of fluids and eating foods with high amount of fiber, such as fruits, vegetables, and grains. Take Colace 100 mg (an over-the-counter stool softener) twice a day as needed for constipation.  Do not take Tylenol if you are taking prescription pain medications.  Hold taking your Metoprolol and Lisinopril until blood pressure increases.  You will need to follow up with your PCP in the next week for post hospitalization visit and review of medications.    Follow Up  Our office will schedule a follow up appointment 2-3 weeks following discharge.  Please call us immediately for any of the following conditions  Severe or worsening pain in your legs or feet while at rest  or while walking Increase pain, redness, warmth, or drainage (pus) from your incision site(s) Fever of 101 degree or higher The swelling in your leg with the bypass suddenly worsens and becomes more painful than when you were in the hospital If you have been instructed to feel your graft pulse then you should do so every day. If you can no longer feel this pulse, call the office immediately. Not all patients are given this  instruction.  Leg swelling is common after leg bypass surgery.  The swelling should improve over a few months following surgery. To improve the swelling, you may elevate your legs above the level of your heart while you are sitting or resting. Your surgeon or physician assistant may ask you to apply an ACE wrap or wear compression (TED) stockings to help to reduce swelling.  Reduce your risk of vascular disease  Stop smoking. If you would like help call [facility] at 1-800-QUIT-NOW (878-110-8780) or [place] at 620-390-4801.  Manage your cholesterol Maintain a desired weight Control your diabetes weight Control your diabetes Keep your blood pressure down  If you have any questions, please call the office at [phone]    Information on my medicine - ELIQUIS (apixaban)  Why was Eliquis prescribed for you? Eliquis was prescribed for you to reduce the risk of a blood clot forming that can cause a stroke if you have a medical condition called atrial fibrillation (a type of irregular heartbeat).  What do You need to know about Eliquis ? Take your Eliquis TWICE DAILY - one tablet in the morning and one tablet in the evening with or without food. If you have difficulty swallowing the tablet whole please discuss with your pharmacist how to take the medication safely.  Take Eliquis exactly as prescribed by your doctor and DO NOT stop taking Eliquis without talking to the doctor who prescribed the medication.  Stopping may increase your risk of developing a stroke.  Refill your prescription before you run out.  After discharge, you should have regular check-up appointments with your healthcare provider that is prescribing your Eliquis.  In the future your dose may need to be changed if your kidney function or weight changes by a significant amount or as you get older.  What do you do if you miss a dose? If you miss a dose, take it as soon as you remember on the same day and  resume taking twice daily.  Do not take more than one dose of ELIQUIS at the same time to make up a missed dose.  Important Safety Information A possible side effect of Eliquis is bleeding. You should call your healthcare provider right away if you experience any of the following: Bleeding from an injury or your nose that does not stop. Unusual colored urine (red or dark brown) or unusual colored stools (red or black). Unusual bruising for unknown reasons. A serious fall or if you hit your head (even if there is no bleeding).  Some medicines may interact with Eliquis and might increase your risk of bleeding or clotting while on Eliquis. To help avoid this, consult your healthcare provider or pharmacist prior to using any new prescription or non-prescription medications, including herbals, vitamins, non-steroidal anti-inflammatory drugs (NSAIDs) and supplements.  This website has more information on Eliquis (apixaban): http://www.eliquis.com/eliquis/home

## 2023-03-29 NOTE — Progress Notes (Signed)
 PHARMACY - ANTICOAGULATION CONSULT NOTE  Pharmacy Consult for Heparin => Indication: DVT  Allergies  Allergen Reactions   Hydrocodone Other (See Comments)    Makes her hyper   Bactrim [Sulfamethoxazole-Trimethoprim] Other (See Comments)    hyponatremia    Patient Measurements: Height: 5\' 1"  (154.9 cm) Weight: 71.6 kg (157 lb 13.6 oz) IBW/kg (Calculated) : 47.8 Heparin Dosing Weight: 64.2 kg  Vital Signs: Temp: 98.4 F (36.9 C) (03/06 1124) Temp Source: Oral (03/06 1124) BP: 112/68 (03/06 1124) Pulse Rate: 72 (03/06 1124)  Labs: Recent Labs    03/26/23 1630 03/27/23 0352 03/27/23 1700 03/28/23 0142 03/28/23 0900 03/28/23 1630 03/29/23 0345  HGB 9.1*  --   --  8.1*  --   --  7.8*  HCT 26.0*  --   --  23.5*  --   --  23.0*  PLT 102*  --   --  122*  --   --  184  HEPARINUNFRC  --   --    < > 0.14* 0.25* 0.29* 0.53  CREATININE  --  0.61  --   --   --   --  0.60   < > = values in this interval not displayed.    Estimated Creatinine Clearance: 63.4 mL/min (by C-G formula based on SCr of 0.6 mg/dL).  Assessment: 68 yo with left lower extremity critical and ischemic tissue loss. S/p Left iliofemoral endarterectomy, embolectomy, extended onto the profunda with greater saphenous vein patch Left greater saphenous vein harvest Left femoral to tibioperoneal trunk bypass using reverse greater saphenous vein  Patient has been on fixed dose heparin since surgery. Pharmacy consulted to start therapeutic dose heparin.  Heparin level is subtherapeutic at 0.29 on 1650 units/hr. No bleeding or infusion issues per RN. Hgb did drop ~1 g/dL. vascular is aware. Believed to be from fluid loss.  Pharmacy consulted to start apixaban for atrial fibrillation.  Goal of Therapy:  Heparin level 0.3-0.7 units/ml Monitor platelets by anticoagulation protocol: Yes   Plan:  D/c heparin drip Start Apixaban 5 mg po bid Monitor for signs and symptoms of bleeding  Thank you for involving  pharmacy in this patient's care.  Jeanella Cara, PharmD, [facility] Clinical Pharmacist Please see AMION for all Pharmacists' Contact Phone Numbers 03/29/2023, 12:26 PM

## 2023-03-29 NOTE — TOC Transition Note (Signed)
 Transition of Care (TOC) - Discharge Note Donn Pierini RN, BSN Transitions of Care Unit 4E- RN Case Manager See Treatment Team for direct phone #   Patient Details  Name: Gabrielle [name] MRN: [id number] Date of Birth: 09-12-1955  Transition of Care [facility]) CM/SW Contact:  Darrold Span, RN Phone Number: 03/29/2023, 1:36 PM   Clinical Narrative:    Pt stable for transition home today, Transition needs met,  DME- delivered- Rollator  HHRN/PT/OT- arranged w/ [facility].  Liaison checking to see when nursing can do start of care visit.    Final next level of care: Home w Home Health Services Barriers to Discharge: Continued Medical Work up   Patient Goals and CMS Choice Patient states their goals for this hospitalization and ongoing recovery are:: return home CMS Medicare.gov Compare Post Acute Care list provided to:: Patient Choice offered to / list presented to : Patient      Discharge Placement                 Home w/ [facility]      Discharge Plan and Services Additional resources added to the After Visit Summary for     Discharge Planning Services: CM Consult Post Acute Care Choice: Home Health, Durable Medical Equipment          DME Arranged: Walker rolling with seat DME Agency: [employer] Date DME Agency Contacted: 03/27/23 Time DME Agency Contacted: 1350 Representative spoke with at DME Agency: Ian Malkin HH Arranged: PT, OT          Social Drivers of Health (SDOH) Interventions SDOH Screenings   Food Insecurity: No Food Insecurity (03/24/2023)  Housing: Low Risk  (03/24/2023)  Transportation Needs: No Transportation Needs (03/24/2023)  Utilities: Not At Risk (03/24/2023)  Depression (PHQ2-9): Low Risk  (05/06/2021)  Financial Resource Strain: Low Risk  (06/15/2017)   [facility], [facility]  Physical Activity: Sufficiently Active (06/15/2017)   Received from [facility], [facility]  Social Connections: Moderately Isolated  (03/25/2023)  Stress: No Stress Concern Present (06/15/2017)   Received from [facility], [facility]  Tobacco Use: Medium Risk (03/23/2023)  Health Literacy: Low Risk  (12/28/2022)   Received from [facility]     Readmission Risk Interventions    03/29/2023    1:03 PM  Readmission Risk Prevention Plan  Transportation Screening Complete  Home Care Screening Complete  Medication Review (RN CM) Complete

## 2023-03-29 NOTE — Progress Notes (Signed)
 Mobility Specialist Progress Note:   03/29/23 1121  Mobility  Activity Ambulated with assistance to bathroom;Ambulated with assistance in hallway;Ambulated with assistance in room  Level of Assistance Standby assist, set-up cues, supervision of patient - no hands on  Assistive Device Four wheel walker  Distance Ambulated (ft) 400 ft  Activity Response Tolerated well  Mobility Referral Yes  Mobility visit 1 Mobility  Mobility Specialist Start Time (ACUTE ONLY) 1110  Mobility Specialist Stop Time (ACUTE ONLY) 1121  Mobility Specialist Time Calculation (min) (ACUTE ONLY) 11 min   Pt received ambulating to bathroom, ModI using IV pole. Ambulated in hallway with 4WW and SV. Tolerated well, took 3 sitting rest breaks with 4WW d/t fatigue. SpO2 95% on 2L throughout session, HR in 90s. Returned pt to room, sitting up in chair with all needs met, call bell in reach.    Feliciana Rossetti Mobility Specialist Please contact via Special educational needs teacher or  Rehab office at [phone]

## 2023-03-29 NOTE — TOC Progression Note (Addendum)
 Transition of Care (TOC) - Progression Note  Donn Pierini RN, BSN Transitions of Care Unit 4E- RN Case Manager See Treatment Team for direct phone #   Patient Details  Name: Gabrielle [name] MRN: [id number] Date of Birth: 09-Aug-1955  Transition of Care [facility]) CM/SW Contact  Zenda Alpers, Lenn Sink, RN Phone Number: 03/29/2023, 11:16 AM  Clinical Narrative:    Referral noted for DOAC and DME needs.   Per benefit check Doac copay for both Eliquis and Xarelto is $47 month.   Cm in to speak with pt and answer questions regarding DME.  Pt is asking if insurance will cover tub/shair chair, grab bars, or BSC. She is also asking about having a nurse come out to do wound care.  During discussion CM explained to pt that Medicare does not cover tub or shower chairs, also does not cover grab bars- pt would have to pay out of pocket for these items. In regards to [facility]- medicare offers coverage if pt meets guidelines- which at this time she does not.  Rollator has been delivered to bedside by [employer] for home.   HH PT/OT has been set up w/ [facility]. Pt is also asking about nursing stating that she has a wound that she can not see and family is not willing to assist her with- explained that even with an MD order- HH would not come out daily, HH visits would be 1-2 visits per week.  Pt voiced she does not understand why insurance will not cover daily- explained she could contact her insurance to ask questions but that [facility] agencies also just don't have the staffing to cover daily visits as well.  CM will reach out to [facility] to see about adding RN to services and when [facility] would be.   CM also provided pt with copay info on Doacs- $47/mo. Pt voiced understanding.   Pt voiced she had no further questions or needs at this time.    Expected Discharge Plan: Home w Home Health Services Barriers to Discharge: Continued Medical Work up  Expected Discharge Plan and Services   Discharge Planning Services:  CM Consult Post Acute Care Choice: Home Health, Durable Medical Equipment Living arrangements for the past 2 months: Single Family Home                 DME Arranged: Walker rolling with seat DME Agency: [employer] Date DME Agency Contacted: 03/27/23 Time DME Agency Contacted: 1350 Representative spoke with at DME Agency: Ian Malkin HH Arranged: PT, OT           Social Determinants of Health (SDOH) Interventions SDOH Screenings   Food Insecurity: No Food Insecurity (03/24/2023)  Housing: Low Risk  (03/24/2023)  Transportation Needs: No Transportation Needs (03/24/2023)  Utilities: Not At Risk (03/24/2023)  Depression (PHQ2-9): Low Risk  (05/06/2021)  Financial Resource Strain: Low Risk  (06/15/2017)   Received from [facility], [facility]  Physical Activity: Sufficiently Active (06/15/2017)   Received from [facility], [facility]  Social Connections: Moderately Isolated (03/25/2023)  Stress: No Stress Concern Present (06/15/2017)   Received from [facility], [facility]  Tobacco Use: Medium Risk (03/23/2023)  Health Literacy: Low Risk  (12/28/2022)   Received from [facility]    Readmission Risk Interventions     No data to display

## 2023-03-29 NOTE — Progress Notes (Signed)
  Progress Note    03/29/2023 6:51 AM 6 Days Post-Op  Subjective:  wants to go home; wants arm bars, tub seat, RN for groin incision/wound. Says she feels like the lasix helped a little.   Afebrile HR 80's-100 100's-150's systolic 95% 2LO2NC  Vitals:   03/29/23 0041 03/29/23 0342  BP: (!) 156/76 (!) 108/53  Pulse: 88 76  Resp: 15 16  Temp: 97.9 F (36.6 C) 98.1 F (36.7 C)  SpO2: 97% 95%    Physical Exam: General:  no distress Cardiac:  regular Lungs:  non labored Incisions:  dressing just changed in groin.  Left thigh incisions look good Extremities:  + doppler flow left AT/PT; RLE wrapped to knee   CBC    Component Value Date/Time   WBC 6.7 03/29/2023 0345   RBC 2.40 (L) 03/29/2023 0345   HGB 7.8 (L) 03/29/2023 0345   HCT 23.0 (L) 03/29/2023 0345   PLT 184 03/29/2023 0345   MCV 95.8 03/29/2023 0345   MCH 32.5 03/29/2023 0345   MCHC 33.9 03/29/2023 0345   RDW 15.7 (H) 03/29/2023 0345   LYMPHSABS 3.0 12/10/2020 0908   MONOABS 0.6 12/10/2020 0908   EOSABS 0.2 12/10/2020 0908   BASOSABS 0.1 12/10/2020 0908    BMET    Component Value Date/Time   NA 135 03/29/2023 0345   K 3.5 03/29/2023 0345   CL 98 03/29/2023 0345   CO2 29 03/29/2023 0345   GLUCOSE 95 03/29/2023 0345   BUN <5 (L) 03/29/2023 0345   CREATININE 0.60 03/29/2023 0345   CREATININE 0.73 03/26/2020 1520   CALCIUM 8.5 (L) 03/29/2023 0345   GFRNONAA >60 03/29/2023 0345   GFRAA >60 06/26/2017 1043    INR    Component Value Date/Time   INR 1.0 03/21/2023 1300     Intake/Output Summary (Last 24 hours) at 03/29/2023 0651 Last data filed at 03/28/2023 1500 Gross per 24 hour  Intake 720 ml  Output --  Net 720 ml      Assessment/Plan:  68 y.o. female is s/p:  left iliofemoral endarterectomy and left femoral to TPT bypass, with takeback profunda thrombectomy, re-do proximal and distal anastomosis, and TPT endarterectomy   6 Days Post-Op   -pt with brisk doppler flow left foot -TOC for  HH OT/PT/RN and pricing for DOAC.  Hopeful for discharge today but may need another day to arrange the above.    -acute blood loss anemia-hgb 7.8 - down just slightly from yesterday & BP tolerating.  -DVT prophylaxis:  heparin gtt   Doreatha Massed, PA-C [facility] [phone] 03/29/2023 6:51 AM

## 2023-03-29 NOTE — Progress Notes (Signed)
 PHARMACY - ANTICOAGULATION CONSULT NOTE  Pharmacy Consult for heparin Indication: DVT  Labs: Recent Labs    03/26/23 1630 03/27/23 0352 03/27/23 1700 03/28/23 0142 03/28/23 0900 03/28/23 1630 03/29/23 0345  HGB 9.1*  --   --  8.1*  --   --  7.8*  HCT 26.0*  --   --  23.5*  --   --  23.0*  PLT 102*  --   --  122*  --   --  184  HEPARINUNFRC  --   --    < > 0.14* 0.25* 0.29* 0.53  CREATININE  --  0.61  --   --   --   --  0.60   < > = values in this interval not displayed.   Assessment/Plan:  68yo female therapeutic on heparin after rate changes. Will continue infusion at current rate of 1800 units/hr and confirm stable with additional level.  Vernard Gambles, PharmD, BCPS 03/29/2023 5:00 AM

## 2023-03-29 NOTE — Plan of Care (Signed)
 Discharge instructions discussed with patient.  Patient instructed on home medications, restrictions, and follow up appointments. Belongings gathered and sent with patient.  Patients medications will be picked up at [facility] pharmacy.

## 2023-03-29 NOTE — Discharge Summary (Signed)
 Discharge Summary     Gabrielle [name] 03-26-1955 68 y.o. female  [id number]  Admission Date: 03/23/2023  Discharge Date: 03/29/2023  Physician: No att. providers found  Admission Diagnosis: Critical lower limb ischemia (HCC) [I70.229]  HPI:   This is a 68 y.o. female  who presents at the request of Vyas, Dhruv B, MD for evaluation of left lower extremity critical and ischemic tissue loss.   She was recently seen by Dr. Ardelle Anton for ingrown toenail of the first toe, who did not feel comfortable performing surgery as she had a nonpalpable pulse in the foot.   On exam, [name] was doing well.  A native of [place], she has lived in [place] for a number of years in [place].  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   Over the last several years, [name] has had progressive symptoms of claudication, which have now become rest pain.  Roughly 4 months ago.  She noted an acute change in the left lower extremity which felt like an acute cramp in the calf.  Since that time, she has been waking up several times at night due to left leg foot and calf pain as well as cramping.  The ingrown toenail has now caused a wound at the medial aspect of the first toe.  The wound has been present for a month.   The pt is on a statin for cholesterol management.  The pt not on a daily aspirin.   Other AC:  - The pt is on medication for hypertension.   The pt not diabetic.   Tobacco hx:  former  Hospital Course:  The patient was admitted to the hospital and taken to the operating room on 03/23/2023 and underwent: Left iliofemoral endarterectomy, embolectomy, extended onto the profunda with greater saphenous vein patch Left greater saphenous vein harvest Left femoral to tibioperoneal trunk bypass using reverse greater saphenous vein    Findings: Occluded left common femoral artery with severe atherosclerotic disease as  well as chronic thrombus.  Occluded profunda.  More distally, there appeared to be another profunda branch prior to the takeoff of the SFA. Greater saphenous vein 4 mm - 3 mm tapering Left below-knee popliteal artery with severe atherosclerotic disease, tibioperoneal trunk with moderate disease, patent with 2.5-3 mm lumen   The pt tolerated the procedure well and was transported to the PACU in good condition. While in the recovery room, her leg was checked and there was a severely depressed signal. This is not the same signal that was present in the operating room, or immediately postop. Plan is to take her back to the operating room, open the incisions and assess both anastomoses as well as the bypass graft. Will perform diagnostic angiography as well with likely bypass revision.   She was taken back to the operating room that day and underwent: Reexploration of the below-knee popliteal artery, tibioperoneal trunk, peroneal artery Reexploration of the left common femoral artery, proximal bypass Ultrasound-guided micropuncture access of the right common femoral artery in retrograde fashion Aortogram Second-order cannulation, left lower extremity angiogram Left-sided common femoral artery patch takedown, profunda thrombectomy, patch reanastomosis Left-sided distal anastomotic takedown, arteriotomy extension into the peroneal artery, Tibioperoneal trunk endarterectomy  Findings: Widely patent common femoral artery Profunda branches were occluded, and upon thrombectomy, the clot appeared organized, not acute.  No dissection, no atherosclerotic disease appreciated that would lead to in situ thrombosis. Patent femoral  to tibioperoneal trunk bypass with filling of the popliteal artery retrograde, anterior tibial artery, peroneal artery.  The distal tibioperoneal trunk, proximal peroneal artery demonstrated a flow-limiting lesion below the level of the anastomosis. Prevena VAC left groin  By POD 1,  she had excellent doppler signals and dressings clean and dry.  She did have acute blood loss anemia and some hypotension.  She required IVF bolus and did receive a unit of PRBC's.  She was continued on zosyn for wound.    POD 2, she continued to have hypotension.  She was given 1 unit of blood for acute blood loss anemia yesterday in addition to fluid boluses.  She continued to have low blood pressures.  I made sure she was off all of her antihypertensives.  She was started on low-dose Levophed.  We will try to wean this off today. -Prophylaxis: Protonix -Continue IV heparin -Will require mobilization and get her ambulating in the hallways  POD 3, she was off of levophed.  She was transferred to the progressive unit.   POD 4, a large blister was unroofed in the left groin when vac removed with some drainage.  A dry gauze was placed. She was transitioned to therapeutic heparin.    POD 5, pt felt her weight was up significantly and she was started on gentle diuresis.   Per Dr. Karin Lieu: Overall happy with how [name] is progressing. Excellent signal in the foot.  I have added 20 mg of Lasix orally for the next 3 days No plan for further blood administration, believe most of this is due to fluid. Will plan on discharge tomorrow.  Continue heparin drip. Apartment as tolerated, dry dressings togroin. Leg wrapped below the knee  POD 6, TOC consulted and HH needs arranged. She did have acute blood loss anemia and was stable and BP tolerating.  She was transitioned to Eliquis.  Discussed with Dr. Karin Lieu and he felt she was ready for discharge.   Dr. Karin Lieu discussed with me he felt she had some PAF, which caused her acute ischemia.  She will be referred to cardiology for Holter Monitor as outpatient.    Her antihypertensives were held due to soft blood pressure.   CBC    Component Value Date/Time   WBC 6.7 03/29/2023 0345   RBC 2.40 (L) 03/29/2023 0345   HGB 7.8 (L) 03/29/2023 0345   HCT 23.0 (L)  03/29/2023 0345   PLT 184 03/29/2023 0345   MCV 95.8 03/29/2023 0345   MCH 32.5 03/29/2023 0345   MCHC 33.9 03/29/2023 0345   RDW 15.7 (H) 03/29/2023 0345   LYMPHSABS 3.0 12/10/2020 0908   MONOABS 0.6 12/10/2020 0908   EOSABS 0.2 12/10/2020 0908   BASOSABS 0.1 12/10/2020 0908    BMET    Component Value Date/Time   NA 135 03/29/2023 0345   K 3.5 03/29/2023 0345   CL 98 03/29/2023 0345   CO2 29 03/29/2023 0345   GLUCOSE 95 03/29/2023 0345   BUN <5 (L) 03/29/2023 0345   CREATININE 0.60 03/29/2023 0345   CREATININE 0.73 03/26/2020 1520   CALCIUM 8.5 (L) 03/29/2023 0345   GFRNONAA >60 03/29/2023 0345   GFRAA >60 06/26/2017 1043     Discharge Instructions     Discharge patient   Complete by: As directed    Discharge disposition: 01-Home or Self Care   Discharge patient date: 03/29/2023   Discharge patient   Complete by: As directed    Discharge pt once all [facility] needs have  been arranged.   Discharge disposition: 01-Home or Self Care   Discharge patient date: 03/29/2023       Discharge Diagnosis:  Critical lower limb ischemia (HCC) [I70.229]  Secondary Diagnosis: Patient Active Problem List   Diagnosis Date Noted   Ingrown nail of great toe of left foot 03/26/2023   Critical lower limb ischemia (HCC) 03/23/2023   Anxiety 09/15/2020   Hyperlipidemia LDL goal <70 03/26/2020   Morbid obesity (HCC) 03/26/2020   Neuropathy 03/26/2020   Osteopenia after menopause 06/13/2018   Insomnia 11/03/2016   Asthma 01/06/2016   Lung cancer, right upper lobe 04/24/2014   S/P lobectomy of lung 04/20/2014   Hyponatremia 08/20/2013   Invasive ductal carcinoma of right breast (HCC) 08/21/2012   Former smoker 08/21/2012   Primary hypertension 04/29/2008   GERD 04/29/2008   Past Medical History:  Diagnosis Date   Anemia    Arthritis    Asthma    Carpal tunnel syndrome    bil   Carpal tunnel syndrome    Chronic pain    Chronic pain in left foot    Complication of anesthesia     states mederate sedation did now worke, was awake   Cough    Depression    espisodic   Drug abuse (HCC)    Dyspnea    with exertion an have failed over night oximenrty - wears oxygen at hs   GERD (gastroesophageal reflux disease)    H/O eating disorder    history of Substance abuse (HCC)    Substance abuse in the past.     Hyperlipidemia    Hypertension    Invasive ductal carcinoma of right breast (HCC) 08/21/2012   Right   Legally blind    Lung cancer (HCC) 2016   Orthostatic hypotension    Pneumonia    03-13-17   S/P bilateral breast implants 08/21/2012   Prior to cancer diagnosis    Sleep apnea    Wears 2-3L oxygen at night   Tobacco abuse 08/21/2012   > 40 pack years     Allergies as of 03/29/2023       Reactions   Hydrocodone Other (See Comments)   Makes her hyper   Bactrim [sulfamethoxazole-trimethoprim] Other (See Comments)   hyponatremia        Medication List     PAUSE taking these medications    lisinopril 20 MG tablet Wait to take this until your doctor or other care provider tells you to start again. Commonly known as: ZESTRIL Take 1 tablet (20 mg total) by mouth daily.   metoprolol succinate 50 MG 24 hr tablet Wait to take this until your doctor or other care provider tells you to start again. Commonly known as: TOPROL-XL Take 50 mg by mouth in the morning. Take with or immediately following a meal.       STOP taking these medications    ibuprofen 800 MG tablet Commonly known as: ADVIL   promethazine 25 MG tablet Commonly known as: PHENERGAN   sodium bicarbonate 650 MG tablet       TAKE these medications    albuterol (2.5 MG/3ML) 0.083% nebulizer solution Commonly known as: PROVENTIL Take 3 mLs (2.5 mg total) by nebulization every 6 (six) hours as needed for wheezing or shortness of breath.   albuterol 108 (90 Base) MCG/ACT inhaler Commonly known as: VENTOLIN HFA Inhale 1-2 puffs into the lungs every 6 (six) hours as needed for  wheezing or shortness of breath.   aspirin  81 MG chewable tablet Chew 81 mg by mouth at bedtime.   atorvastatin 40 MG tablet Commonly known as: LIPITOR Take 1 tablet (40 mg total) by mouth daily. What changed: when to take this   Breo Ellipta 200-25 MCG/ACT Aepb Generic drug: fluticasone furoate-vilanterol Inhale 1 puff into the lungs in the morning.   CALTRATE 600+D3 SOFT PO Take 1 tablet by mouth at bedtime.   Eliquis 5 MG Tabs tablet Generic drug: apixaban Take 1 tablet (5 mg total) by mouth 2 (two) times daily. Refills per PCP or cardiology.   escitalopram 20 MG tablet Commonly known as: Lexapro Take 1 tablet (20 mg total) by mouth daily. What changed: when to take this   furosemide 20 MG tablet Commonly known as: LASIX Take 1 tablet (20 mg total) by mouth daily.   gabapentin 800 MG tablet Commonly known as: NEURONTIN Take 1 tablet (800 mg total) by mouth daily. What changed: when to take this   mupirocin ointment 2 % Commonly known as: BACTROBAN Apply 1 Application topically 2 (two) times daily.   omeprazole 20 MG capsule Commonly known as: PRILOSEC Take 20 mg by mouth at bedtime.   ondansetron 4 MG tablet Commonly known as: ZOFRAN Take 4 mg by mouth 3 (three) times daily as needed for nausea or vomiting.   oxyCODONE-acetaminophen 5-325 MG tablet Commonly known as: Percocet Take 1 tablet by mouth every 6 (six) hours as needed.   potassium chloride 10 MEQ tablet Commonly known as: KLOR-CON M Take 1 tablet (10 mEq total) by mouth 2 (two) times daily.   temazepam 15 MG capsule Commonly known as: RESTORIL Take 15 mg by mouth at bedtime.   traZODone 100 MG tablet Commonly known as: DESYREL Take 100 mg by mouth at bedtime.   Vortex Valved Holding Chamber Devi 1 puff by Does not apply route daily.        Discharge Instructions: [facility] Discharge instructions Lower Extremity Bypass Surgery  Please refer to the  following instruction for your post-procedure care. Your surgeon or physician assistant will discuss any changes with you.  Activity  You are encouraged to walk as much as you can. You can slowly return to normal activities during the month after your surgery. Avoid strenuous activity and heavy lifting until your doctor tells you it's OK. Avoid activities such as vacuuming or swinging a golf club. Do not drive until your doctor give the OK and you are no longer taking prescription pain medications. It is also normal to have difficulty with sleep habits, eating and bowel movement after surgery. These will go away with time.  Bathing/Showering  You may shower after you go home. Do not soak in a bathtub, hot tub, or swim until the incision heals completely.  Incision Care  Clean your incision with mild soap and water. Shower every day. Pat the area dry with a clean towel. You do not need a bandage unless otherwise instructed. Do not apply any ointments or creams to your incision. If you have open wounds you will be instructed how to care for them or a visiting nurse may be arranged for you. If you have staples or sutures along your incision they will be removed at your post-op appointment. You may have skin glue on your incision. Do not peel it off. It will come off on its own in about one week.  Wash the groin wound with soap and water daily and pat dry. (No tub bath-only shower)  Then put a dry gauze or washcloth in the groin to keep this area dry to help prevent wound infection.  Do this daily and as needed.  Do not use Vaseline or neosporin on your incisions.  Only use soap and water on your incisions and then protect and keep dry.  Diet  Resume your normal diet. There are no special food restrictions following this procedure. A low fat/ low cholesterol diet is recommended for all patients with vascular disease. In order to heal from your surgery, it is CRITICAL to get adequate nutrition. Your  body requires vitamins, minerals, and protein. Vegetables are the best source of vitamins and minerals. Vegetables also provide the perfect balance of protein. Processed food has little nutritional value, so try to avoid this.  Medications  Resume taking all your medications unless your doctor or Physician Assistant tells you not to. If your incision is causing pain, you may take over-the-counter pain relievers such as acetaminophen (Tylenol). If you were prescribed a stronger pain medication, please aware these medication can cause nausea and constipation. Prevent nausea by taking the medication with a snack or meal. Avoid constipation by drinking plenty of fluids and eating foods with high amount of fiber, such as fruits, vegetables, and grains. Take Colace 100 mg (an over-the-counter stool softener) twice a day as needed for constipation.  Do not take Tylenol if you are taking prescription pain medications.  Follow Up  Our office will schedule a follow up appointment 2-3 weeks following discharge.  Please call us immediately for any of the following conditions  Severe or worsening pain in your legs or feet while at rest or while walking Increase pain, redness, warmth, or drainage (pus) from your incision site(s) Fever of 101 degree or higher The swelling in your leg with the bypass suddenly worsens and becomes more painful than when you were in the hospital If you have been instructed to feel your graft pulse then you should do so every day. If you can no longer feel this pulse, call the office immediately. Not all patients are given this instruction.  Leg swelling is common after leg bypass surgery.  The swelling should improve over a few months following surgery. To improve the swelling, you may elevate your legs above the level of your heart while you are sitting or resting. Your surgeon or physician assistant may ask you to apply an ACE wrap or wear compression (TED) stockings to help to  reduce swelling.  Reduce your risk of vascular disease  Stop smoking. If you would like help call [facility] at 1-800-QUIT-NOW (313-005-2428) or [place] at (810)607-3714.  Manage your cholesterol Maintain a desired weight Control your diabetes weight Control your diabetes Keep your blood pressure down  If you have any questions, please call the office at [phone]   Prescriptions given: 1.  Roxicet #30 No Refill 2.  Eliquis 5mg  bid #60 with two refills (refills per PCP or cardiology) 3.  Lasix 20mg  daily x 3 days starting 03/30/2023 4.  K-dur daily x 3 days starting 03/30/2023   Disposition: home with HHPT/OT/RN  Patient's condition: is Good  Follow up: 1. [facility] in 04/05/2023 for post op check (on Dr. Karin Lieu clinic day) 2.  Cardiology for holter monitor with hx of afib and acute ischemia 3.  PCP one week for post hospital visit and medication review   Doreatha Massed, PA-C [facility] [phone] 03/30/2023  9:05 AM  - For VQI Registry use ---  Post-op:  Wound infection: No  Graft infection: No  Transfusion: Yes    If yes, 3 units given New Arrhythmia: No Ipsilateral amputation: No, [ ]  Minor, [ ]  BKA, [ ]  AKA Discharge patency: [x ] Primary, [ ]  Primary assisted, [ ]  Secondary, [ ]  Occluded Patency judged by: [x ] Dopper only, [ ]  Palpable graft pulse, []  Palpable distal pulse, [ ]  ABI inc. > 0.15, [ ]  Duplex Discharge ABI: R not done, L  D/C Ambulatory Status: Ambulatory  Complications: MI: No, [ ]  Troponin only, [ ]  EKG or Clinical CHF: No Resp failure:No, [ ]  Pneumonia, [ ]  Ventilator Chg in renal function: No, [ ]  Inc. Cr > 0.5, [ ]  Temp. Dialysis,  [ ]  Permanent dialysis Stroke: No, [ ]  Minor, [ ]  Major Return to OR: No  Reason for return to OR: [ ]  Bleeding, [ ]  Infection, [ ]  Thrombosis, [ ]  Revision  Discharge medications: Statin use:  yes ASA use:  yes Plavix use:  no Beta blocker use: no-held CCB use:  No ACEI use:    no-held ARB use:  no Coumadin use: no Eliquis:  yes

## 2023-04-04 ENCOUNTER — Other Ambulatory Visit: Payer: Self-pay | Admitting: *Deleted

## 2023-04-04 ENCOUNTER — Ambulatory Visit: Attending: Vascular Surgery

## 2023-04-04 DIAGNOSIS — R600 Localized edema: Secondary | ICD-10-CM | POA: Diagnosis not present

## 2023-04-04 DIAGNOSIS — I998 Other disorder of circulatory system: Secondary | ICD-10-CM

## 2023-04-04 DIAGNOSIS — I10 Essential (primary) hypertension: Secondary | ICD-10-CM | POA: Diagnosis not present

## 2023-04-04 DIAGNOSIS — I480 Paroxysmal atrial fibrillation: Secondary | ICD-10-CM

## 2023-04-04 DIAGNOSIS — I70245 Atherosclerosis of native arteries of left leg with ulceration of other part of foot: Secondary | ICD-10-CM | POA: Diagnosis not present

## 2023-04-04 DIAGNOSIS — L03116 Cellulitis of left lower limb: Secondary | ICD-10-CM | POA: Diagnosis not present

## 2023-04-04 DIAGNOSIS — J449 Chronic obstructive pulmonary disease, unspecified: Secondary | ICD-10-CM | POA: Diagnosis not present

## 2023-04-04 DIAGNOSIS — Z299 Encounter for prophylactic measures, unspecified: Secondary | ICD-10-CM | POA: Diagnosis not present

## 2023-04-04 NOTE — Progress Notes (Unsigned)
 EP to read.

## 2023-04-04 NOTE — Progress Notes (Unsigned)
 Office Note    HPI: Gabrielle [name] is a 68 y.o. ([birth year]) female presenting in follow-up status post left sided iliofemoral endarterectomy and femoral to TPT bypass complicated by occlusion, subsequent revision with profunda embolectomy, tibioperoneal trunk endarterectomy.  On exam, she was doing well, accompanied by her husband. Since her hospitalization, she feels as though she is doing well at home.  She has resolution of her rest pain, and is very happy about that.  She was all smiles today.  When asked about her groin wound, she has noted a little bit of yellow drainage, no purulence, no odor.  She is changing the dressing twice daily.  She is also noted a small amount of clear yellow drainage at the below-knee popliteal artery incision.  No purulence, no odor.   The pt is  on a statin for cholesterol management.  The pt is  on a daily aspirin.   Other AC:  eliquis The pt is  on medication for hypertension.   The pt is not diabetic.  Tobacco hx:  former  Past Medical History:  Diagnosis Date   Anemia    Arthritis    Asthma    Carpal tunnel syndrome    bil   Carpal tunnel syndrome    Chronic pain    Chronic pain in left foot    Complication of anesthesia    states mederate sedation did now worke, was awake   Cough    Depression    espisodic   Drug abuse (HCC)    Dyspnea    with exertion an have failed over night oximenrty - wears oxygen at hs   GERD (gastroesophageal reflux disease)    H/O eating disorder    history of Substance abuse (HCC)    Substance abuse in the past.     Hyperlipidemia    Hypertension    Invasive ductal carcinoma of right breast (HCC) 08/21/2012   Right   Legally blind    Lung cancer (HCC) 2016   Orthostatic hypotension    Pneumonia    03-13-17   S/P bilateral breast implants 08/21/2012   Prior to cancer diagnosis    Sleep apnea    Wears 2-3L oxygen at night   Tobacco abuse 08/21/2012   > 40 pack years    Past Surgical History:   Procedure Laterality Date   ABDOMINAL AORTOGRAM W/LOWER EXTREMITY Left 03/21/2023   Procedure: ABDOMINAL AORTOGRAM W/LOWER EXTREMITY;  Surgeon: Victorino Sparrow, MD;  Location: [facility] INVASIVE CV LAB;  Service: Cardiovascular;  Laterality: Left;   BREAST LUMPECTOMY Left 1990   benigh / right was cancer   BREAST LUMPECTOMY Right 2014   BREAST LUMPECTOMY WITH SENTINEL LYMPH NODE BIOPSY Right 04/2012   ENDARTERECTOMY FEMORAL Left 03/23/2023   Procedure: ENDARTERECTOMY LEFT FEMORAL;  Surgeon: Victorino Sparrow, MD;  Location: [facility] OR;  Service: Vascular;  Laterality: Left;   ESOPHAGOGASTRODUODENOSCOPY (EGD) WITH PROPOFOL N/A 05/10/2017   Procedure: ESOPHAGOGASTRODUODENOSCOPY (EGD) WITH PROPOFOL;  Surgeon: Rachael Fee, MD;  Location: [facility] ENDOSCOPY;  Service: Endoscopy;  Laterality: N/A;   FEMORAL-POPLITEAL BYPASS GRAFT Left 03/23/2023   Procedure: BYPASS GRAFT LEFT FEMORAL-PERONEAL ARTERY;  Surgeon: Victorino Sparrow, MD;  Location: [facility] OR;  Service: Vascular;  Laterality: Left;   FEMORAL-POPLITEAL BYPASS GRAFT Left 03/23/2023   Procedure: BYPASS REVISION;  Surgeon: Victorino Sparrow, MD;  Location: [facility] OR;  Service: Vascular;  Laterality: Left;   LOBECTOMY Right 04/20/2014   Procedure: LOBECTOMY, RIGHT UPPER LOBE WITH PLACEMENT OF ONQ  PAIN PUMP;  Surgeon: Delight Ovens, MD;  Location: [facility] OR;  Service: Thoracic;  Laterality: Right;   LOWER EXTREMITY ANGIOGRAM Left 03/23/2023   Procedure: LOWER EXTREMITY ANGIOGRAM;  Surgeon: Victorino Sparrow, MD;  Location: [facility] OR;  Service: Vascular;  Laterality: Left;   NODE DISSECTION Right 04/20/2014   Procedure: NODE DISSECTION;  Surgeon: Delight Ovens, MD;  Location: [facility] OR;  Service: Thoracic;  Laterality: Right;   PATCH ANGIOPLASTY  03/23/2023   Procedure: August Saucer ANGIOPLASTY;  Surgeon: Victorino Sparrow, MD;  Location: [facility] OR;  Service: Vascular;;   PLACEMENT OF BREAST IMPLANTS  1993   PLANTAR FASCIA RELEASE Left 1990s   SHOULDER ARTHROSCOPY WITH SUBACROMIAL  DECOMPRESSION, ROTATOR CUFF REPAIR AND BICEP TENDON REPAIR Right 06/27/2017   Procedure: RIGHT SHOULDER ARTHROSCOPY WITH DEBRIDEMENT, SUBACROMIAL DECOMPRESSION AND ROTATOR CUFF REPAIR, BICEP TENODESIS;  Surgeon: Bjorn Pippin, MD;  Location: [facility] OR;  Service: Orthopedics;  Laterality: Right;   TONSILLECTOMY AND ADENOIDECTOMY  1965   VAGINAL HYSTERECTOMY  1990   VEIN HARVEST Left 03/23/2023   Procedure: GREATER SAPHENOUS VEIN HARVEST;  Surgeon: Victorino Sparrow, MD;  Location: [facility] OR;  Service: Vascular;  Laterality: Left;   VIDEO ASSISTED THORACOSCOPY (VATS)/WEDGE RESECTION Right 04/20/2014   Procedure: VIDEO ASSISTED THORACOSCOPY ;  Surgeon: Delight Ovens, MD;  Location[facility] OR;  Service: Thoracic;  Laterality: Right;   VIDEO BRONCHOSCOPY N/A 04/20/2014   Procedure: VIDEO BRONCHOSCOPY;  Surgeon: Delight Ovens, MD;  Location: [facility] OR;  Service: Thoracic;  Laterality: N/A;   WEDGE RESECTION Right 04/20/2014   Procedure: WEDGE RESECTION, RIGHT UPPER LOBE;  Surgeon: Delight Ovens, MD;  Location: [facility] OR;  Service: Thoracic;  Laterality: Right;    Social History   Socioeconomic History   Marital status: Divorced    Spouse name: Not on file   Number of children: 1   Years of education: Not on file   Highest education level: Not on file  Occupational History    Employer: [employer]  Tobacco Use   Smoking status: Former    Current packs/day: 0.00    Average packs/day: 1 pack/day for 43.2 years (43.2 ttl pk-yrs)    Types: Cigarettes    Start date: 43    Quit date: 04/20/2014    Years since quitting: 8.9   Smokeless tobacco: Never  Vaping Use   Vaping status: Never Used  Substance and Sexual Activity   Alcohol use: Yes    Alcohol/week: 3.0 standard drinks of alcohol    Types: 3 Cans of beer per week   Drug use: No    Comment: history of substance abuse - none now-    Sexual activity: Not Currently  Other Topics Concern   Not on file  Social History Narrative    Marital status/children/pets: Divorced.   Education/employment: 2 years of college.  Works as a Museum/gallery exhibitions officer.      Social Drivers of Corporate investment banker Strain: Low Risk  (06/15/2017)   Received from [facility], [facility]   Overall Financial Resource Strain (CARDIA)    Difficulty of Paying Living Expenses: Not hard at all  Food Insecurity: No Food Insecurity (03/24/2023)   Hunger Vital Sign    Worried About Running Out of Food in the Last Year: Never true    Ran Out of Food in the Last Year: Never true  Transportation Needs: No Transportation Needs (03/24/2023)   PRAPARE - Transportation    Lack  of Transportation (Medical): No    Lack of Transportation (Non-Medical): No  Physical Activity: Sufficiently Active (06/15/2017)   [facility]   Exercise Vital Sign    Days of Exercise per Week: 7 days    Minutes of Exercise per Session: 30 min  Stress: No Stress Concern Present (06/15/2017)   Received from [facility], [facility] of Occupational Health - Occupational Stress Questionnaire    Feeling of Stress : Not at all  Social Connections: Moderately Isolated (03/25/2023)   Social Connection and Isolation Panel [NHANES]    Frequency of Communication with Friends and Family: More than three times a week    Frequency of Social Gatherings with Friends and Family: Not on file    Attends Religious Services: Never    Active Member of Clubs or Organizations: No    Attends Banker Meetings: Never    Marital Status: Living with partner  Intimate Partner Violence: Not At Risk (03/24/2023)   Humiliation, Afraid, Rape, and Kick questionnaire    Fear of Current or Ex-Partner: No    Emotionally Abused: No    Physically Abused: No    Sexually Abused: No   Family History  Problem Relation Age of Onset   Hypertension Mother    Alcohol abuse Mother    Arthritis Mother    COPD Mother    Depression  Mother    Heart disease Mother    Hyperlipidemia Mother    Mental illness Mother    Hypertension Father    Alcohol abuse Father    Hearing loss Father    Heart attack Father    Heart disease Father    Hyperlipidemia Father    Stroke Father    Kidney disease Father    Arthritis Father    Depression Brother    Mental illness Brother    Hepatitis C Brother    Hepatitis C Brother    Alcohol abuse Sister    Depression Sister    Mental illness Sister    Lymphoma Maternal Aunt 41   Leukemia Maternal Grandmother        dx in her late 39s   Alcohol abuse Maternal Grandmother    Hearing loss Maternal Grandmother    Heart disease Maternal Grandfather    Heart disease Paternal Grandmother    Birth defects Daughter     Current Outpatient Medications  Medication Sig Dispense Refill   albuterol (PROVENTIL) (2.5 MG/3ML) 0.083% nebulizer solution Take 3 mLs (2.5 mg total) by nebulization every 6 (six) hours as needed for wheezing or shortness of breath. 150 mL 1   albuterol (VENTOLIN HFA) 108 (90 Base) MCG/ACT inhaler Inhale 1-2 puffs into the lungs every 6 (six) hours as needed for wheezing or shortness of breath. 8 g 6   apixaban (ELIQUIS) 5 MG TABS tablet Take 1 tablet (5 mg total) by mouth 2 (two) times daily. Refills per PCP or cardiology. 60 tablet 2   aspirin 81 MG chewable tablet Chew 81 mg by mouth at bedtime.     atorvastatin (LIPITOR) 40 MG tablet Take 1 tablet (40 mg total) by mouth daily. (Patient taking differently: Take 40 mg by mouth at bedtime.) 90 tablet 3   BREO ELLIPTA 200-25 MCG/ACT AEPB Inhale 1 puff into the lungs in the morning.     Calcium Carb-Cholecalciferol (CALTRATE 600+D3 SOFT PO) Take 1 tablet by mouth at bedtime.     escitalopram (LEXAPRO) 20  MG tablet Take 1 tablet (20 mg total) by mouth daily. (Patient taking differently: Take 20 mg by mouth at bedtime.) 90 tablet 1   furosemide (LASIX) 20 MG tablet Take 1 tablet (20 mg total) by mouth daily. 3 tablet 0    gabapentin (NEURONTIN) 800 MG tablet Take 1 tablet (800 mg total) by mouth daily. (Patient taking differently: Take 800 mg by mouth 2 (two) times daily.) 90 tablet 1   [Paused] lisinopril (ZESTRIL) 20 MG tablet Take 1 tablet (20 mg total) by mouth daily. (Patient taking differently: Take 20 mg by mouth at bedtime.) 90 tablet 1   [Paused] metoprolol succinate (TOPROL-XL) 50 MG 24 hr tablet Take 50 mg by mouth in the morning. Take with or immediately following a meal.     mupirocin ointment (BACTROBAN) 2 % Apply 1 Application topically 2 (two) times daily. 30 g 2   omeprazole (PRILOSEC) 20 MG capsule Take 20 mg by mouth at bedtime.     ondansetron (ZOFRAN) 4 MG tablet Take 4 mg by mouth 3 (three) times daily as needed for nausea or vomiting.     oxyCODONE-acetaminophen (PERCOCET) 5-325 MG tablet Take 1 tablet by mouth every 6 (six) hours as needed. 28 tablet 0   potassium chloride (KLOR-CON M) 10 MEQ tablet Take 1 tablet (10 mEq total) by mouth 2 (two) times daily. 3 tablet 0   Spacer/Aero-Holding Chambers (VORTEX VALVED HOLDING CHAMBER) DEVI 1 puff by Does not apply route daily.     temazepam (RESTORIL) 15 MG capsule Take 15 mg by mouth at bedtime.     traZODone (DESYREL) 100 MG tablet Take 100 mg by mouth at bedtime.     No current facility-administered medications for this visit.    Allergies  Allergen Reactions   Hydrocodone Other (See Comments)    Makes her hyper   Bactrim [Sulfamethoxazole-Trimethoprim] Other (See Comments)    hyponatremia     REVIEW OF SYSTEMS:  [X]  denotes positive finding, [ ]  denotes negative finding Cardiac  Comments:  Chest pain or chest pressure:    Shortness of breath upon exertion:    Short of breath when lying flat:    Irregular heart rhythm:        Vascular    Pain in calf, thigh, or hip brought on by ambulation:    Pain in feet at night that wakes you up from your sleep:     Blood clot in your veins:    Leg swelling:         Pulmonary    Oxygen  at home:    Productive cough:     Wheezing:         Neurologic    Sudden weakness in arms or legs:     Sudden numbness in arms or legs:     Sudden onset of difficulty speaking or slurred speech:    Temporary loss of vision in one eye:     Problems with dizziness:         Gastrointestinal    Blood in stool:     Vomited blood:         Genitourinary    Burning when urinating:     Blood in urine:        Psychiatric    Major depression:         Hematologic    Bleeding problems:    Problems with blood clotting too easily:        Skin    Rashes or  ulcers:        Constitutional    Fever or chills:      PHYSICAL EXAMINATION:  There were no vitals filed for this visit.  General:  WDWN in NAD; vital signs documented above Gait: Not observed HENT: WNL, normocephalic Pulmonary: normal non-labored breathing , without wheezing Cardiac: regular HR Abdomen: soft, NT, no masses Skin: without rashes Vascular Exam/Pulses:  Right Left  Radial 2+ (normal) 2+ (normal)  Ulnar    Femoral    Popliteal    DP  Strong monophasic  PT  Strong multiphasic   Peroneal with excellent, triphasic signal  Extremities: without ischemic changes, without Gangrene , without cellulitis; without open wounds;   Left groin stable and intact, some mild skin sloughing at the lateral edge.  Medial edge nice and dry, healing appropriately, no concern for infection Thigh vein harvest sites healing well.  Monocryl intact calf staple site healing appropriately Continued left lower extremity edema, mild  Musculoskeletal: no muscle wasting or atrophy  Neurologic: A&O X 3;  No focal weakness or paresthesias are detected Psychiatric:  The pt has Normal affect.   Non-Invasive Vascular Imaging:   none    ASSESSMENT/PLAN: YAREMI STAHLMAN is a 68 y.o. female presenting for wound check status post bypass.  Overall, Joann is doing well.  I am happy with how her wounds are healing.  The only questionable  portion is the lateral aspect of the groin wound.  This is a difficult place to heal on anyone due to skin folds.  There is significant dermal necrosis, small amount of drainage, however this is clear and scant.  Edison and I discussed wound care.  She was sent home with sticks of Betadine to gently paint the wound in the left groin in an effort to help dry it out as well as heal the area.  She is on antibiotics per her primary due to some concern for cellulitis.  I do not have concern for that, but I am okay with the antibiotics as I think that with the drainage this will help prevent any infection. She is also on Lasix, which I think is an excellent call in an effort to decrease the amount of fluid in the left leg.  Ingrown toenail healed on the left great toe  The left leg was wrapped through the calf.  My plan is to see her in 1 week's time for wound check.  At that time, possibly have to staples come out, this will depend on how the wound beds look.  She will be on the PA schedule, however I will also see Jemma when she arrives.      Victorino Sparrow, MD [facility] [phone]

## 2023-04-05 ENCOUNTER — Encounter: Payer: Self-pay | Admitting: Vascular Surgery

## 2023-04-05 ENCOUNTER — Ambulatory Visit (INDEPENDENT_AMBULATORY_CARE_PROVIDER_SITE_OTHER): Admitting: Vascular Surgery

## 2023-04-05 VITALS — BP 166/100 | HR 76 | Temp 98.4°F | Ht 61.0 in | Wt 155.7 lb

## 2023-04-05 DIAGNOSIS — Z9889 Other specified postprocedural states: Secondary | ICD-10-CM

## 2023-04-12 ENCOUNTER — Encounter: Payer: Self-pay | Admitting: Vascular Surgery

## 2023-04-12 ENCOUNTER — Ambulatory Visit (INDEPENDENT_AMBULATORY_CARE_PROVIDER_SITE_OTHER): Admitting: Physician Assistant

## 2023-04-12 VITALS — BP 149/83 | HR 82 | Temp 98.0°F | Ht 61.0 in | Wt 150.8 lb

## 2023-04-12 DIAGNOSIS — I70245 Atherosclerosis of native arteries of left leg with ulceration of other part of foot: Secondary | ICD-10-CM

## 2023-04-12 DIAGNOSIS — Z9889 Other specified postprocedural states: Secondary | ICD-10-CM

## 2023-04-12 MED ORDER — FUROSEMIDE 20 MG PO TABS: 20.0000 mg | ORAL_TABLET | Freq: Every day | ORAL | 0 refills | Status: DC

## 2023-04-12 NOTE — Progress Notes (Signed)
 POST OPERATIVE OFFICE NOTE    CC:  F/u for surgery  HPI:  This is a 68 y.o. female who is s/p left iliofemoral endarterectomy and femoral to TP trunk bypass complicated by occlusion and subsequent revision with profunda embolectomy, tibioperoneal trunk endarterectomy.  This was performed by Dr. Karin Lieu on 03/23/2023 due to critical limb ischemia with tissue loss.  She still has some thin clear, yellowish drainage from her groin however keeps up with ABD dressing changes.  She denies any rest pain in her left foot.  She does have some swelling in her left leg however this improves with elevation.  She has a dull ache in the entire left leg however this is improving.  She believes her left great toe is also improving.  She is on daily Eliquis.  Allergies  Allergen Reactions   Hydrocodone Other (See Comments)    Makes her hyper   Bactrim [Sulfamethoxazole-Trimethoprim] Other (See Comments)    hyponatremia    Current Outpatient Medications  Medication Sig Dispense Refill   albuterol (PROVENTIL) (2.5 MG/3ML) 0.083% nebulizer solution Take 3 mLs (2.5 mg total) by nebulization every 6 (six) hours as needed for wheezing or shortness of breath. 150 mL 1   albuterol (VENTOLIN HFA) 108 (90 Base) MCG/ACT inhaler Inhale 1-2 puffs into the lungs every 6 (six) hours as needed for wheezing or shortness of breath. 8 g 6   apixaban (ELIQUIS) 5 MG TABS tablet Take 1 tablet (5 mg total) by mouth 2 (two) times daily. Refills per PCP or cardiology. 60 tablet 2   aspirin 81 MG chewable tablet Chew 81 mg by mouth at bedtime.     atorvastatin (LIPITOR) 40 MG tablet Take 1 tablet (40 mg total) by mouth daily. (Patient taking differently: Take 40 mg by mouth at bedtime.) 90 tablet 3   BREO ELLIPTA 200-25 MCG/ACT AEPB Inhale 1 puff into the lungs in the morning.     escitalopram (LEXAPRO) 20 MG tablet Take 1 tablet (20 mg total) by mouth daily. (Patient taking differently: Take 20 mg by mouth at bedtime.) 90 tablet 1    furosemide (LASIX) 20 MG tablet Take 1 tablet (20 mg total) by mouth daily. 3 tablet 0   gabapentin (NEURONTIN) 800 MG tablet Take 1 tablet (800 mg total) by mouth daily. (Patient taking differently: Take 800 mg by mouth 2 (two) times daily.) 90 tablet 1   [Paused] lisinopril (ZESTRIL) 20 MG tablet Take 1 tablet (20 mg total) by mouth daily. (Patient taking differently: Take 20 mg by mouth at bedtime.) 90 tablet 1   [Paused] metoprolol succinate (TOPROL-XL) 50 MG 24 hr tablet Take 50 mg by mouth in the morning. Take with or immediately following a meal.     mupirocin ointment (BACTROBAN) 2 % Apply 1 Application topically 2 (two) times daily. 30 g 2   omeprazole (PRILOSEC) 20 MG capsule Take 20 mg by mouth at bedtime.     ondansetron (ZOFRAN) 4 MG tablet Take 4 mg by mouth 3 (three) times daily as needed for nausea or vomiting.     oxyCODONE-acetaminophen (PERCOCET) 5-325 MG tablet Take 1 tablet by mouth every 6 (six) hours as needed. 28 tablet 0   potassium chloride (KLOR-CON M) 10 MEQ tablet Take 1 tablet (10 mEq total) by mouth 2 (two) times daily. 3 tablet 0   Spacer/Aero-Holding Chambers (VORTEX VALVED HOLDING CHAMBER) DEVI 1 puff by Does not apply route daily.     temazepam (RESTORIL) 15 MG capsule Take 15 mg  by mouth at bedtime.     traZODone (DESYREL) 100 MG tablet Take 100 mg by mouth at bedtime.     No current facility-administered medications for this visit.     ROS:  See HPI  Physical Exam:  Vitals:   04/12/23 1251  BP: (!) 149/83  Pulse: 82  Temp: 98 F (36.7 C)  TempSrc: Temporal  SpO2: 94%  Weight: 150 lb 12.8 oz (68.4 kg)  Height: 5\' 1"  (1.549 m)    Incision:  L popliteal incision healing well; left thigh incisions well-healed; left groin incision healing well with some superficial fibrinous exudate Extremities: Brisk left peroneal, DP, and PT signal by Doppler Neuro: A&O  Assessment/Plan:  This is a 68 y.o. female who is s/p: Left iliofemoral endarterectomy with  left femoral to TP trunk bypass with vein complicated by occlusion with subsequent profunda embolectomy and TP trunk endarterectomy  Left leg well-perfused with brisk peroneal, DP, and PT signals by Doppler.  Left popliteal incision is healing well and every other staple was removed today.  Her thigh vein harvest incisions are well-healed.  The groin incision is mostly healing well.  She has some superficial skin edge fibrinous exudate but no sign of infection.  She will continue at least daily dry dressing changes due to ongoing minimal thin drainage from her groin incision.  She will continue to walk during the day.  She can use light knee-high compression due to ongoing edema.  She will also be prescribed 3 days of Lasix.  She will follow-up for another wound check in 1 week.  At that time we will consider removal of staples at the popliteal incision as well as the groin.   Emilie Rutter, PA-C [facility] [phone]  Clinic MD:  Karin Lieu

## 2023-04-13 ENCOUNTER — Ambulatory Visit: Admitting: Podiatry

## 2023-04-13 ENCOUNTER — Other Ambulatory Visit: Payer: Self-pay | Admitting: Physician Assistant

## 2023-04-14 ENCOUNTER — Other Ambulatory Visit: Payer: Self-pay | Admitting: Physician Assistant

## 2023-04-16 ENCOUNTER — Other Ambulatory Visit: Payer: Self-pay | Admitting: Physician Assistant

## 2023-04-16 DIAGNOSIS — J4541 Moderate persistent asthma with (acute) exacerbation: Secondary | ICD-10-CM | POA: Diagnosis not present

## 2023-04-17 ENCOUNTER — Other Ambulatory Visit: Payer: Self-pay | Admitting: Physician Assistant

## 2023-04-17 NOTE — Telephone Encounter (Signed)
 Short term order-no refills

## 2023-04-19 ENCOUNTER — Encounter: Payer: Self-pay | Admitting: Vascular Surgery

## 2023-04-19 ENCOUNTER — Ambulatory Visit (INDEPENDENT_AMBULATORY_CARE_PROVIDER_SITE_OTHER): Admitting: Physician Assistant

## 2023-04-19 VITALS — BP 151/82 | HR 82 | Temp 98.6°F | Ht 61.0 in | Wt 150.5 lb

## 2023-04-19 DIAGNOSIS — I70245 Atherosclerosis of native arteries of left leg with ulceration of other part of foot: Secondary | ICD-10-CM

## 2023-04-19 MED ORDER — FUROSEMIDE 20 MG PO TABS: 20.0000 mg | ORAL_TABLET | Freq: Every day | ORAL | 0 refills | Status: DC

## 2023-04-20 ENCOUNTER — Other Ambulatory Visit (HOSPITAL_COMMUNITY): Payer: Self-pay

## 2023-04-20 ENCOUNTER — Telehealth: Payer: Self-pay | Admitting: *Deleted

## 2023-04-20 NOTE — Telephone Encounter (Signed)
 Triage call asking about lifting restriction . Pt has upcoming post op appointment in 05/03/23. Informed pt to hold off working in the garden and heavy lifting until after this appointment so we can revaluate.

## 2023-04-21 ENCOUNTER — Other Ambulatory Visit: Payer: Self-pay | Admitting: Physician Assistant

## 2023-04-22 NOTE — Progress Notes (Signed)
 POST OPERATIVE OFFICE NOTE    CC:  F/u for surgery  HPI:  Gabrielle [name] is a 68 y.o. female who is here for a wound check.  She recently underwent left iliofemoral endarterectomy with femoral to TP trunk bypass complicated by occlusion and subsequent revision with profunda embolectomy and tibioperoneal trunk endarterectomy.  This was performed on 03/23/2023 by Dr. Karin Lieu for critical limb ischemia with left great toe tissue loss.  Postoperatively she had some left groin incisional dehiscence with slough.  She has been keeping this area clean and dry with a ABD dressing.  Pt returns today for follow up.  Pt states she is doing fairly well.  She denies any rest pain in the left lower extremity.  She believes that her left great toe is healing well.  She is unsure if her left groin has made any improvements.  She still reports thin, yellowish drainage coming from her left groin incision daily.  She denies any pus-like drainage or fevers. She usually changes the ABD pad 1x daily.   Allergies  Allergen Reactions   Hydrocodone Other (See Comments)    Makes her hyper   Bactrim [Sulfamethoxazole-Trimethoprim] Other (See Comments)    hyponatremia    Current Outpatient Medications  Medication Sig Dispense Refill   furosemide (LASIX) 20 MG tablet Take 1 tablet (20 mg total) by mouth daily. 5 tablet 0   albuterol (PROVENTIL) (2.5 MG/3ML) 0.083% nebulizer solution Take 3 mLs (2.5 mg total) by nebulization every 6 (six) hours as needed for wheezing or shortness of breath. 150 mL 1   albuterol (VENTOLIN HFA) 108 (90 Base) MCG/ACT inhaler Inhale 1-2 puffs into the lungs every 6 (six) hours as needed for wheezing or shortness of breath. 8 g 6   apixaban (ELIQUIS) 5 MG TABS tablet Take 1 tablet (5 mg total) by mouth 2 (two) times daily. Refills per PCP or cardiology. 60 tablet 2   aspirin 81 MG chewable tablet Chew 81 mg by mouth at bedtime.     atorvastatin (LIPITOR) 40 MG tablet Take 1 tablet (40 mg  total) by mouth daily. (Patient taking differently: Take 40 mg by mouth at bedtime.) 90 tablet 3   BREO ELLIPTA 200-25 MCG/ACT AEPB Inhale 1 puff into the lungs in the morning.     escitalopram (LEXAPRO) 20 MG tablet Take 1 tablet (20 mg total) by mouth daily. (Patient taking differently: Take 20 mg by mouth at bedtime.) 90 tablet 1   gabapentin (NEURONTIN) 800 MG tablet Take 1 tablet (800 mg total) by mouth daily. (Patient taking differently: Take 800 mg by mouth 2 (two) times daily.) 90 tablet 1   [Paused] lisinopril (ZESTRIL) 20 MG tablet Take 1 tablet (20 mg total) by mouth daily. (Patient taking differently: Take 20 mg by mouth at bedtime.) 90 tablet 1   [Paused] metoprolol succinate (TOPROL-XL) 50 MG 24 hr tablet Take 50 mg by mouth in the morning. Take with or immediately following a meal.     mupirocin ointment (BACTROBAN) 2 % Apply 1 Application topically 2 (two) times daily. 30 g 2   omeprazole (PRILOSEC) 20 MG capsule Take 20 mg by mouth at bedtime.     ondansetron (ZOFRAN) 4 MG tablet Take 4 mg by mouth 3 (three) times daily as needed for nausea or vomiting.     oxyCODONE-acetaminophen (PERCOCET) 5-325 MG tablet Take 1 tablet by mouth every 6 (six) hours as needed. 28 tablet 0   potassium chloride (KLOR-CON M) 10 MEQ tablet Take 1  tablet (10 mEq total) by mouth 2 (two) times daily. 3 tablet 0   Spacer/Aero-Holding Chambers (VORTEX VALVED HOLDING CHAMBER) DEVI 1 puff by Does not apply route daily.     temazepam (RESTORIL) 15 MG capsule Take 15 mg by mouth at bedtime.     traZODone (DESYREL) 100 MG tablet Take 100 mg by mouth at bedtime.     No current facility-administered medications for this visit.     ROS:  See HPI  Physical Exam:   Incision:  left groin incision appears about the same with central dehiscence and yellow slough, no signs of infection. Left popliteal incision well healed. All staples removed Extremities:  brisk left DP/PT/Peroneal doppler signals Neuro: intact  motor and sensation of left leg      Assessment/Plan:  This is a 68 y.o. female who presents today for wound check  -The patient recently underwent left iliofemoral endarterectomy with left femoral to TP trunk bypass, requiring same-day takeback for profunda embolectomy and TP trunk endarterectomy -She returns today for wound check of a left groin incision.  She reports ongoing thin, yellowish drainage from her left groin incision.  She states the drainage is about the same amount as it was last week.  She denies any puslike drainage or fevers -On exam her left groin incision is healing appropriately with some central, superficial dehiscence and slough.  There are no signs of infection.  Her left popliteal incision is well-healed.  All staples were removed from her incisions today -Dr. Karin Lieu was also present during evaluation.  At this time we will try to continue conservative treatment to her left groin.  We have recommended that she continue to paint her incision daily with Betadine and continue ABD dressing changes -She can follow-up with our office in another 2 to 3 weeks for repeat wound check   Loel Dubonnet, PA-C [facility] [phone]   Clinic MD:  Karin Lieu

## 2023-04-23 ENCOUNTER — Other Ambulatory Visit: Payer: Self-pay | Admitting: Physician Assistant

## 2023-04-24 ENCOUNTER — Other Ambulatory Visit: Payer: Self-pay | Admitting: Physician Assistant

## 2023-04-24 ENCOUNTER — Encounter: Payer: Self-pay | Admitting: Vascular Surgery

## 2023-04-24 MED ORDER — FUROSEMIDE 20 MG PO TABS: 20.0000 mg | ORAL_TABLET | Freq: Every day | ORAL | 0 refills | Status: DC

## 2023-04-26 ENCOUNTER — Other Ambulatory Visit: Payer: Self-pay | Admitting: Physician Assistant

## 2023-04-27 NOTE — Telephone Encounter (Signed)
 Follow up with PCP due to medication needs to be monitored

## 2023-04-30 ENCOUNTER — Ambulatory Visit: Admitting: Podiatry

## 2023-04-30 DIAGNOSIS — I480 Paroxysmal atrial fibrillation: Secondary | ICD-10-CM | POA: Diagnosis not present

## 2023-04-30 DIAGNOSIS — I998 Other disorder of circulatory system: Secondary | ICD-10-CM | POA: Diagnosis not present

## 2023-05-01 DIAGNOSIS — I480 Paroxysmal atrial fibrillation: Secondary | ICD-10-CM

## 2023-05-03 ENCOUNTER — Encounter: Payer: Self-pay | Admitting: Vascular Surgery

## 2023-05-03 ENCOUNTER — Ambulatory Visit (INDEPENDENT_AMBULATORY_CARE_PROVIDER_SITE_OTHER): Admitting: Physician Assistant

## 2023-05-03 VITALS — BP 137/77 | HR 94 | Temp 98.1°F | Ht 61.0 in | Wt 152.6 lb

## 2023-05-03 DIAGNOSIS — I70245 Atherosclerosis of native arteries of left leg with ulceration of other part of foot: Secondary | ICD-10-CM

## 2023-05-03 NOTE — Progress Notes (Signed)
 POST OPERATIVE OFFICE NOTE    CC:  F/u for surgery  HPI:  This is a 68 y.o. female who is s/p left  iliofemoral endarterectomy, embolectomy, extended onto the profunda with greater saphenous vein patch, Left femoral to tibioperoneal trunk bypass using reverse greater saphenous vein on 03/23/2023 by Dr. Karin Lieu.   Later that day, she had to return to the OR for re-exploration with left sided femoral artery patch takedown, profunda thrombectomy, patch reanastomosis, left-sided distal anastomotic takedown, arteriotomy extension into the peroneal artery, and tibioperoneal trunk endarterectomy also by Dr. Karin Lieu.   Pt was seen 04/19/2023 and she had some clear yellowish drainage from her left groin and was keeping up with this with ABD dressings.  Her groin incision was closed to healed with some superficial skin edge fibrinous exudate but no signs of infection.  Ok to use mild knee high compression.  The staples were removed from her lower leg incision.  It was recommended to paint groin incision with betadine daily.    She was not having any rest pain in the left foot.  She was having some swelling in the left leg that improved with elevation.  She felt her great toe was also getting better.    Pt returns today for follow up.  Pt states she is doing well.  She continues to have drainage that remains clear, however it is about 30% less than last visit.  She states she continues to have leg swelling but it is also better and improves after she has been in bed.  She does have chills in the afternoon but no fevers.  States her bandage has an odor after about 10 hours when she changes the dressing.    Allergies  Allergen Reactions   Hydrocodone Other (See Comments)    Makes her hyper   Bactrim [Sulfamethoxazole-Trimethoprim] Other (See Comments)    hyponatremia    Current Outpatient Medications  Medication Sig Dispense Refill   albuterol (PROVENTIL) (2.5 MG/3ML) 0.083% nebulizer solution Take 3 mLs  (2.5 mg total) by nebulization every 6 (six) hours as needed for wheezing or shortness of breath. 150 mL 1   albuterol (VENTOLIN HFA) 108 (90 Base) MCG/ACT inhaler Inhale 1-2 puffs into the lungs every 6 (six) hours as needed for wheezing or shortness of breath. 8 g 6   apixaban (ELIQUIS) 5 MG TABS tablet Take 1 tablet (5 mg total) by mouth 2 (two) times daily. Refills per PCP or cardiology. 60 tablet 2   aspirin 81 MG chewable tablet Chew 81 mg by mouth at bedtime.     atorvastatin (LIPITOR) 40 MG tablet Take 1 tablet (40 mg total) by mouth daily. (Patient taking differently: Take 40 mg by mouth at bedtime.) 90 tablet 3   BREO ELLIPTA 200-25 MCG/ACT AEPB Inhale 1 puff into the lungs in the morning.     escitalopram (LEXAPRO) 20 MG tablet Take 1 tablet (20 mg total) by mouth daily. (Patient taking differently: Take 20 mg by mouth at bedtime.) 90 tablet 1   furosemide (LASIX) 20 MG tablet Take 1 tablet (20 mg total) by mouth daily. 5 tablet 0   gabapentin (NEURONTIN) 800 MG tablet Take 1 tablet (800 mg total) by mouth daily. (Patient taking differently: Take 800 mg by mouth 2 (two) times daily.) 90 tablet 1   [Paused] lisinopril (ZESTRIL) 20 MG tablet Take 1 tablet (20 mg total) by mouth daily. (Patient taking differently: Take 20 mg by mouth at bedtime.) 90 tablet 1   [  Paused] metoprolol succinate (TOPROL-XL) 50 MG 24 hr tablet Take 50 mg by mouth in the morning. Take with or immediately following a meal.     mupirocin ointment (BACTROBAN) 2 % Apply 1 Application topically 2 (two) times daily. 30 g 2   omeprazole (PRILOSEC) 20 MG capsule Take 20 mg by mouth at bedtime.     ondansetron (ZOFRAN) 4 MG tablet Take 4 mg by mouth 3 (three) times daily as needed for nausea or vomiting.     oxyCODONE-acetaminophen (PERCOCET) 5-325 MG tablet Take 1 tablet by mouth every 6 (six) hours as needed. 28 tablet 0   potassium chloride (KLOR-CON M) 10 MEQ tablet Take 1 tablet (10 mEq total) by mouth 2 (two) times  daily. 3 tablet 0   Spacer/Aero-Holding Chambers (VORTEX VALVED HOLDING CHAMBER) DEVI 1 puff by Does not apply route daily.     temazepam (RESTORIL) 15 MG capsule Take 15 mg by mouth at bedtime.     traZODone (DESYREL) 100 MG tablet Take 100 mg by mouth at bedtime.     No current facility-administered medications for this visit.     ROS:  See HPI  Physical Exam:  Today's Vitals   05/03/23 1259  BP: 137/77  Pulse: 94  Temp: 98.1 F (36.7 C)  TempSrc: Temporal  SpO2: 95%  Weight: 152 lb 9.6 oz (69.2 kg)  Height: 5\' 1"  (1.549 m)   Body mass index is 28.83 kg/m.   Incision:  lower leg incisions have healed.  Left groin incision still with fibrinous tissue but appears smaller than pictured last visit  Extremities:  brisk multiphasic doppler flow left DP/PT/pero. Mild LLE swelling     Assessment/Plan:  This is a 68 y.o. female who is s/p:  left  iliofemoral endarterectomy, embolectomy, extended onto the profunda with greater saphenous vein patch, Left femoral to tibioperoneal trunk bypass using reverse greater saphenous vein on 03/23/2023 by Dr. Karin Lieu.   Later that day, she had to return to the OR for re-exploration with left sided femoral artery patch takedown, profunda thrombectomy, patch reanastomosis, left-sided distal anastomotic takedown, arteriotomy extension into the peroneal artery, and tibioperoneal trunk endarterectomy also by Dr. Karin Lieu.   -pt with brisk doppler flow left foot.   -left groin incision with less drainage.  Dr. Karin Lieu trimmed a small amount of the fibrinous tissue.  She will continue to paint with betadine and place dry dressing and keep incision dry.    No odor on exam today.   No evidence of infection on exam today. -proximal thigh incision with stitch that will resolve. 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   -f/u in 4 weeks for incision check.  She will call sooner if any issues before then.  -continue asa/statin.  She is on  Eliquis   Doreatha Massed, [facility] [phone]   Clinic MD:  Karin Lieu

## 2023-05-04 DIAGNOSIS — Z299 Encounter for prophylactic measures, unspecified: Secondary | ICD-10-CM | POA: Diagnosis not present

## 2023-05-04 DIAGNOSIS — G4700 Insomnia, unspecified: Secondary | ICD-10-CM | POA: Diagnosis not present

## 2023-05-04 DIAGNOSIS — I10 Essential (primary) hypertension: Secondary | ICD-10-CM | POA: Diagnosis not present

## 2023-05-04 DIAGNOSIS — J449 Chronic obstructive pulmonary disease, unspecified: Secondary | ICD-10-CM | POA: Diagnosis not present

## 2023-05-04 DIAGNOSIS — R5383 Other fatigue: Secondary | ICD-10-CM | POA: Diagnosis not present

## 2023-05-10 DIAGNOSIS — G4700 Insomnia, unspecified: Secondary | ICD-10-CM | POA: Diagnosis not present

## 2023-05-10 DIAGNOSIS — I10 Essential (primary) hypertension: Secondary | ICD-10-CM | POA: Diagnosis not present

## 2023-05-14 DIAGNOSIS — R6883 Chills (without fever): Secondary | ICD-10-CM | POA: Diagnosis not present

## 2023-05-14 DIAGNOSIS — N766 Ulceration of vulva: Secondary | ICD-10-CM | POA: Diagnosis not present

## 2023-05-14 DIAGNOSIS — I10 Essential (primary) hypertension: Secondary | ICD-10-CM | POA: Diagnosis not present

## 2023-05-16 ENCOUNTER — Telehealth: Payer: Self-pay | Admitting: Surgery

## 2023-05-16 NOTE — Telephone Encounter (Signed)
 The patient called tonight and described what sounds like a spontaneous abdominal wall hematoma.  She said that the bruising measures about 4 x 6 inches and that there is a 2 inch hardened area in the middle.  She does not recall any specific trauma however she was fishing yesterday.  I told her to place a ice pack over her area of bruising and to monitor it for expansion and to let me know if it gets bigger.  I also told her to hold her Eliquis  for 48 hours.  She said that Dr. Rosalva Comber was potentially going to discontinue this based on the results of her cardiac studies.  I will let Dr. Christia Cowboy know so that he can follow-up  Gareld June

## 2023-05-17 DIAGNOSIS — J4541 Moderate persistent asthma with (acute) exacerbation: Secondary | ICD-10-CM | POA: Diagnosis not present

## 2023-05-18 ENCOUNTER — Telehealth: Payer: Self-pay | Admitting: *Deleted

## 2023-05-18 NOTE — Telephone Encounter (Signed)
 Triage call regarding abdominal hematoma . Pt called on call MD and was giving instruction , following up today the bruise has increased in size but most likely due to gravity. The hard center has not changed. Pt. Is to start back eliquis  starting tomorrow and if area increases in size at all pt is to immediately stop eliquis  and call our office . Throughout the weekend if symptom increase or worsen pt is to go to the emergence department . Will notified Dr. Rosalva Comber

## 2023-05-28 ENCOUNTER — Encounter: Payer: Self-pay | Admitting: Primary Care

## 2023-05-28 ENCOUNTER — Ambulatory Visit: Payer: PRIVATE HEALTH INSURANCE | Admitting: Primary Care

## 2023-05-28 VITALS — BP 165/88 | HR 80 | Temp 97.5°F | Ht 61.0 in | Wt 152.4 lb

## 2023-05-28 DIAGNOSIS — G4733 Obstructive sleep apnea (adult) (pediatric): Secondary | ICD-10-CM | POA: Diagnosis not present

## 2023-05-28 DIAGNOSIS — Z85118 Personal history of other malignant neoplasm of bronchus and lung: Secondary | ICD-10-CM | POA: Diagnosis not present

## 2023-05-28 DIAGNOSIS — Z902 Acquired absence of lung [part of]: Secondary | ICD-10-CM

## 2023-05-28 DIAGNOSIS — I998 Other disorder of circulatory system: Secondary | ICD-10-CM | POA: Diagnosis not present

## 2023-05-28 DIAGNOSIS — Z87891 Personal history of nicotine dependence: Secondary | ICD-10-CM

## 2023-05-28 DIAGNOSIS — J9611 Chronic respiratory failure with hypoxia: Secondary | ICD-10-CM | POA: Diagnosis not present

## 2023-05-28 DIAGNOSIS — J4540 Moderate persistent asthma, uncomplicated: Secondary | ICD-10-CM

## 2023-05-28 MED ORDER — BREO ELLIPTA 200-25 MCG/ACT IN AEPB: 1.0000 | INHALATION_SPRAY | Freq: Every morning | RESPIRATORY_TRACT | 11 refills | Status: AC

## 2023-05-28 MED ORDER — ALBUTEROL SULFATE HFA 108 (90 BASE) MCG/ACT IN AERS
1.0000 | INHALATION_SPRAY | Freq: Four times a day (QID) | RESPIRATORY_TRACT | 6 refills | Status: AC | PRN
Start: 2023-05-28 — End: ?

## 2023-05-28 NOTE — Patient Instructions (Addendum)
 -ASTHMA: Asthma is a condition where your airways narrow and swell, making it difficult to breathe. Your asthma is well-controlled, and you have not needed to use your Breo inhaler in the last four weeks. Continue using Breo as needed and keep your rescue inhaler available for emergencies. Monitor for any symptoms like wheezing, shortness of breath, or chest tightness and restart Breo if needed.  -CHRONIC RESPIRATORY FAILURE: Chronic respiratory failure occurs when your lungs can't get enough oxygen  into your blood. You are managing this condition with oxygen  therapy at night, using two liters per minute. Continue this therapy as prescribed.  -LUNG CANCER: Lung cancer is a type of cancer that begins in the lungs. You had a lobectomy in 2016, and your recent CT scan showed no signs of the cancer coming back or spreading. No further action is needed at this time.  -ATRIAL FIBRILLATION: Atrial fibrillation is an irregular and often rapid heart rate that can increase your risk of strokes and other heart-related complications. You were on a two-week Xyopatch monitor, which appeared negative. You are currently taking Eliquis , a blood thinner, and we will follow up with your cardiologist to discuss the results and whether you can stop taking Eliquis .  -ISCHEMIA OF LOWER LEG: Ischemia of the lower leg means that there is reduced blood flow to your leg, which can cause pain and other issues. You had an endarterectomy with a vein graft to improve blood flow. You are currently on a blood thinner but have a large hematoma on your abdomen. We will follow up with your vascular specialist on May 8th to discuss stopping the blood thinner.  Follow-up 1 year with [name] or sooner if needed   Asthma, Adult  Asthma is a condition that causes swelling and narrowing of the airways. These are the passages that lead from the nose and mouth down into the lungs. When asthma symptoms get worse it is called an asthma attack  or flare. This can make it hard to breathe. Asthma flares can range from minor to life-threatening. There is no cure for asthma, but medicines and lifestyle changes can help to control it. What are the causes? It is not known exactly what causes asthma, but certain things can cause asthma symptoms to get worse (triggers). What can trigger an asthma attack? Cigarette smoke. Mold. Dust. Your pet's skin flakes (dander). Cockroaches. Pollen. Air pollution (like household cleaners, wood smoke, smog, or Therapist, occupational). What are the signs or symptoms? Trouble breathing (shortness of breath). Coughing. Making high-pitched whistling sounds when you breathe, most often when you breathe out (wheezing). Chest tightness. Tiredness with little activity. Poor exercise tolerance. How is this treated? Controller medicines that help prevent asthma symptoms. Fast-acting reliever or rescue medicines. These give short-term relief of asthma symptoms. Allergy medicines if your attacks are brought on by allergens. Medicines to help control the body's defense (immune) system. Staying away from the things that cause asthma attacks. Follow these instructions at home: Avoiding triggers in your home Do not allow anyone to smoke in your home. Limit use of fireplaces and wood stoves. Get rid of pests (such as roaches and mice) and their droppings. Keep your home clean. Clean your floors. Dust regularly. Use cleaning products that do not smell. Wash bed sheets and blankets every week in hot water . Dry them in a dryer. Have someone vacuum when you are not home. Change your heating and air conditioning filters often. Use blankets that are made of polyester or cotton. General instructions  Take over-the-counter and prescription medicines only as told by your doctor. Do not smoke or use any products that contain nicotine  or tobacco. If you need help quitting, ask your doctor. Stay away from secondhand  smoke. Avoid doing things outdoors when allergen counts are high and when air quality is low. Warm up before you exercise. Take time to cool down after exercise. Use a peak flow meter as told by your doctor. A peak flow meter is a tool that measures how well your lungs are working. Keep track of the peak flow meter's readings. Write them down. Follow your asthma action plan. This is a written plan for taking care of your asthma and treating your attacks. Make sure you get all the shots (vaccines) that your doctor recommends. Ask your doctor about a flu shot and a pneumonia shot. Keep all follow-up visits. Contact a doctor if: You have wheezing, shortness of breath, or a cough even while taking medicine to prevent attacks. The mucus you cough up (sputum) is thicker than usual. The mucus you cough up changes from clear or white to yellow, green, gray, or is bloody. You have problems from the medicine you are taking, such as: A rash. Itching. Swelling. Trouble breathing. You need reliever medicines more than 2-3 times a week. Your peak flow reading is still at 50-79% of your personal best after following the action plan for 1 hour. You have a fever. Get help right away if: You seem to be worse and are not responding to medicine during an asthma attack. You are short of breath even at rest. You get short of breath when doing very little activity. You have trouble eating, drinking, or talking. You have chest pain or tightness. You have a fast heartbeat. Your lips or fingernails start to turn blue. You are light-headed or dizzy, or you faint. Your peak flow is less than 50% of your personal best. You feel too tired to breathe normally. These symptoms may be an emergency. Get help right away. Call 911. Do not wait to see if the symptoms will go away. Do not drive yourself to the hospital. Summary Asthma is a long-term (chronic) condition in which the airways get tight and narrow. An  asthma attack can make it hard to breathe. Asthma cannot be cured, but medicines and lifestyle changes can help control it. Make sure you understand how to avoid triggers and how and when to use your medicines. Avoid things that can cause allergy symptoms (allergens). These include animal skin flakes (dander) and pollen from trees or grass. Avoid things that pollute the air. These may include household cleaners, wood smoke, smog, or chemical odors. This information is not intended to replace advice given to you by your health care provider. Make sure you discuss any questions you have with your health care provider. Document Revised: 10/18/2020 Document Reviewed: 10/18/2020 Elsevier Patient Education  2024 [employer].

## 2023-05-28 NOTE — Progress Notes (Signed)
 @Patient  ID: Gabrielle [name], female    DOB: January 24, 1955, 68 y.o.   MRN: [id number]  Chief Complaint  Patient presents with   Follow-up    Asthma f/u    Referring provider: Orlena Bitters, MD  HPI: 68 year old female, former smoker quit in 2016 (40-pack-year history).  Past medical history significant for hypertension, asthma, emphysema, nocturnal hypoxemia, non small cell lung cancer s/p lobectomy, breast cancer, GERD, osteopenia, hyperlipidemia, morbid obesity.  Former patient of Dr. Matilde Son.   Previous [facility] pulmonary encounter:  11/28/2022 Discussed the use of AI scribe software for clinical note transcription with the patient, who gave verbal consent to proceed.  History of Present Illness   Patient presents today for an overdue follow-up.  She was last seen in March 2022 for asthma exacerbation treated with Augmentin  and prednisone . CXR showed hyperinflation.  She is followed by our office for history of moderate persistent asthma and chronic respiratory failure.   She reports her oxygen  concentrator broke approximately three months ago, and she has been without supplemental oxygen  since. Despite this, she denies experiencing nocturnal dyspnea or other respiratory distress. She was previously on 2 liters of oxygen  at night.  The patient also reports she has been off her Symbicort  due to insurance issues and has been using Vraylar instead, which she feels is less effective. However, she denies any significant shortness of breath or need for a rescue inhaler. BREO is covered by her insurance.  Additionally, the patient has a history of lung cancer with a right upper lobectomy in 2016. She expresses concern that it has been approximately three years since her last follow-up CT scan. She denies any symptoms suggestive of recurrence, such as hemoptysis or unexplained weight loss.   05/28/2023- interim  Discussed the use of AI scribe software for clinical note transcription with the patient,  who gave verbal consent to proceed.  History of Present Illness   Gabrielle [name] is a 68 year old female with asthma, chronic respiratory failure, and lung cancer who presents for follow-up of her respiratory conditions.  She has a history of asthma and chronic respiratory failure. She was switched from Symbicort  to [facility] due to insurance coverage issues. She has not needed to use Breo or any inhaler in the last four weeks and has not experienced wheezing. Her last breathing test in 2019 showed mild small airway disease. She has a rescue inhaler for emergencies and feels her breathing is well controlled. She uses two liters of oxygen  at night and has no daytime oxygen  requirements. She reports a mild cough with slightly yellow mucus once a day and no blood. No unintentional weight loss, though her weight has been slowly decreasing.  She has a history of lung cancer with a lobectomy in 2016. A CT scan in December 2025 showed no evidence of recurrence or metastatic disease.  She underwent an endarterectomy with a vein graft harvested and placed in her left leg. Her left pulse is now detectable, whereas it was not before. An arteriogram in February showed issues with her lower leg ischemia. She is currently on a blood thinner following the procedure and experienced a large hematoma on her abdomen. She was on a heparin  drip in the hospital and is now on a two-week home monitor for AFib.  For sleep, she uses trazodone  and temazepam . No episodes of waking up gasping or choking and no daytime grogginess or trouble waking up in the morning.      Allergies  Allergen  Reactions   Hydrocodone Other (See Comments)    Makes her hyper   Bactrim [Sulfamethoxazole-Trimethoprim] Other (See Comments)    hyponatremia    Immunization History  Administered Date(s) Administered   Influenza Inj Mdck Quad Pf 10/25/2018   Influenza Split 09/23/2013   Influenza,inj,Quad PF,6+ Mos 11/05/2014, 12/31/2015,  11/05/2019, 09/15/2020   Influenza-Unspecified 10/22/2016, 10/25/2018   PFIZER(Purple Top)SARS-COV-2 Vaccination 05/02/2019, 06/03/2019, 01/19/2020   Pneumococcal Polysaccharide-23 05/24/2015   Tdap 04/11/2008    Past Medical History:  Diagnosis Date   Anemia    Arthritis    Asthma    Carpal tunnel syndrome    bil   Carpal tunnel syndrome    Chronic pain    Chronic pain in left foot    Complication of anesthesia    states mederate sedation did now worke, was awake   Cough    Depression    espisodic   Drug abuse (HCC)    Dyspnea    with exertion an have failed over night oximenrty - wears oxygen  at hs   GERD (gastroesophageal reflux disease)    H/O eating disorder    history of Substance abuse (HCC)    Substance abuse in the past.     Hyperlipidemia    Hypertension    Invasive ductal carcinoma of right breast (HCC) 08/21/2012   Right   Legally blind    Lung cancer (HCC) 2016   Orthostatic hypotension    Pneumonia    03-13-17   S/P bilateral breast implants 08/21/2012   Prior to cancer diagnosis    Sleep apnea    Wears 2-3L oxygen  at night   Tobacco abuse 08/21/2012   > 40 pack years    Tobacco History: Social History   Tobacco Use  Smoking Status Former   Current packs/day: 0.00   Average packs/day: 1 pack/day for 43.2 years (43.2 ttl pk-yrs)   Types: Cigarettes   Start date: 57   Quit date: 04/20/2014   Years since quitting: 9.1  Smokeless Tobacco Never   Counseling given: Not Answered   Outpatient Medications Prior to Visit  Medication Sig Dispense Refill   albuterol  (PROVENTIL ) (2.5 MG/3ML) 0.083% nebulizer solution Take 3 mLs (2.5 mg total) by nebulization every 6 (six) hours as needed for wheezing or shortness of breath. 150 mL 1   albuterol  (VENTOLIN  HFA) 108 (90 Base) MCG/ACT inhaler Inhale 1-2 puffs into the lungs every 6 (six) hours as needed for wheezing or shortness of breath. 8 g 6   apixaban  (ELIQUIS ) 5 MG TABS tablet Take 1 tablet (5 mg  total) by mouth 2 (two) times daily. Refills per PCP or cardiology. 60 tablet 2   aspirin  81 MG chewable tablet Chew 81 mg by mouth at bedtime.     atorvastatin  (LIPITOR) 40 MG tablet Take 1 tablet (40 mg total) by mouth daily. (Patient taking differently: Take 40 mg by mouth at bedtime.) 90 tablet 3   BREO ELLIPTA  200-25 MCG/ACT AEPB Inhale 1 puff into the lungs in the morning.     escitalopram  (LEXAPRO ) 20 MG tablet Take 1 tablet (20 mg total) by mouth daily. (Patient taking differently: Take 20 mg by mouth at bedtime.) 90 tablet 1   gabapentin  (NEURONTIN ) 800 MG tablet Take 1 tablet (800 mg total) by mouth daily. (Patient taking differently: Take 800 mg by mouth 2 (two) times daily.) 90 tablet 1   lisinopril  (ZESTRIL ) 20 MG tablet Take 1 tablet (20 mg total) by mouth daily. (Patient taking differently: Take 20  mg by mouth at bedtime.) 90 tablet 1   omeprazole  (PRILOSEC) 20 MG capsule Take 20 mg by mouth at bedtime.     ondansetron  (ZOFRAN ) 4 MG tablet Take 4 mg by mouth 3 (three) times daily as needed for nausea or vomiting.     Spacer/Aero-Holding Chambers (VORTEX VALVED HOLDING CHAMBER) DEVI 1 puff by Does not apply route daily.     temazepam  (RESTORIL ) 15 MG capsule Take 15 mg by mouth at bedtime.     traZODone  (DESYREL ) 150 MG tablet Take 150 mg by mouth at bedtime.     furosemide  (LASIX ) 20 MG tablet Take 1 tablet (20 mg total) by mouth daily. 5 tablet 0   metoprolol  succinate (TOPROL -XL) 50 MG 24 hr tablet Take 50 mg by mouth in the morning. Take with or immediately following a meal.     mupirocin  ointment (BACTROBAN ) 2 % Apply 1 Application topically 2 (two) times daily. 30 g 2   oxyCODONE -acetaminophen  (PERCOCET) 5-325 MG tablet Take 1 tablet by mouth every 6 (six) hours as needed. 28 tablet 0   potassium chloride  (KLOR-CON  M) 10 MEQ tablet Take 1 tablet (10 mEq total) by mouth 2 (two) times daily. 3 tablet 0   traZODone  (DESYREL ) 100 MG tablet Take 150 mg by mouth at bedtime. (Patient  not taking: Reported on 05/28/2023)     No facility-administered medications prior to visit.   Review of Systems  Review of Systems  Constitutional: Negative.   HENT: Negative.    Respiratory:  Positive for cough. Negative for chest tightness, shortness of breath and wheezing.   Cardiovascular: Negative.      Physical Exam  BP (!) 165/88 (BP Location: Left Arm, Patient Position: Sitting, Cuff Size: Normal)   Pulse 80   Temp (!) 97.5 F (36.4 C) (Temporal)   Ht 5\' 1"  (1.549 m)   Wt 152 lb 6.4 oz (69.1 kg)   SpO2 96%   BMI 28.80 kg/m  Physical Exam Constitutional:      Appearance: Normal appearance.  HENT:     Head: Normocephalic and atraumatic.  Cardiovascular:     Rate and Rhythm: Normal rate and regular rhythm.  Pulmonary:     Effort: Pulmonary effort is normal.     Breath sounds: Rhonchi present. No wheezing or rales.  Musculoskeletal:        General: Normal range of motion.  Skin:    General: Skin is warm and dry.  Neurological:     General: No focal deficit present.     Mental Status: She is alert and oriented to person, place, and time. Mental status is at baseline.  Psychiatric:        Mood and Affect: Mood normal.        Behavior: Behavior normal.        Thought Content: Thought content normal.        Judgment: Judgment normal.      Lab Results:  CBC    Component Value Date/Time   WBC 6.7 03/29/2023 0345   RBC 2.40 (L) 03/29/2023 0345   HGB 7.8 (L) 03/29/2023 0345   HCT 23.0 (L) 03/29/2023 0345   PLT 184 03/29/2023 0345   MCV 95.8 03/29/2023 0345   MCH 32.5 03/29/2023 0345   MCHC 33.9 03/29/2023 0345   RDW 15.7 (H) 03/29/2023 0345   LYMPHSABS 3.0 12/10/2020 0908   MONOABS 0.6 12/10/2020 0908   EOSABS 0.2 12/10/2020 0908   BASOSABS 0.1 12/10/2020 0908    BMET  Component Value Date/Time   NA 135 03/29/2023 0345   K 3.5 03/29/2023 0345   CL 98 03/29/2023 0345   CO2 29 03/29/2023 0345   GLUCOSE 95 03/29/2023 0345   BUN <5 (L) 03/29/2023  0345   CREATININE 0.60 03/29/2023 0345   CREATININE 0.73 03/26/2020 1520   CALCIUM  8.5 (L) 03/29/2023 0345   GFRNONAA >60 03/29/2023 0345   GFRAA >60 06/26/2017 1043    BNP    Component Value Date/Time   BNP 194.6 (H) 04/23/2014 1223    ProBNP No results found for: "PROBNP"  Imaging: LONG TERM MONITOR (3-14 DAYS) Result Date: 05/01/2023 14 day monitor Sinus rhythm HR 54 - 138, average 88 bpm. 3 episodes of nonsustained SVT (longest 10.7 seconds beats), max rate 188 bpm No atrial fibrillation detected. Rare supraventricular ectopy. Rare ventricular ectopy. No sustained arrhythmias. No symptom trigger episodes. [facility] Cardiac [facility]     Assessment & Plan:    1. Moderate persistent asthma without complication (Primary)  2. Chronic respiratory failure with hypoxia (HCC)  Assessment and Plan    Asthma Asthma is well-controlled with no recent use of Breo or any inhaler in the last four weeks. She reports no wheezing or shortness of breath. Lung function in 2019 indicated mild small airway disease. No audible wheezing on examination. - Continue Breo as needed for asthma control. - Ensure availability of rescue inhaler for emergencies. - Monitor for breakthrough symptoms such as wheezing, shortness of breath, or chest tightness and restart Breo if needed.  Chronic respiratory failure Mild OSA. Chronic respiratory failure is managed with nocturnal oxygen  therapy at two liters per minute. She has no daytime oxygen  requirement and reports no episodes of waking up gasping or choking. - Continue nocturnal oxygen  therapy at two liters per minute.  Lung cancer Lobectomy performed in 2016 for lung cancer. Recent CT scan in December 2025 showed no evidence of recurrence or metastatic disease.  Ischemia of lower leg Endarterectomy with vein graft performed for lower leg ischemia. Currently on blood thinner, but experiencing a large hematoma on the abdomen. - Follow  up with vascular specialist on May 8th to discuss discontinuation of blood thinner.  Antonio Baumgarten, NP 05/28/2023

## 2023-05-31 ENCOUNTER — Ambulatory Visit: Attending: Vascular Surgery | Admitting: Physician Assistant

## 2023-05-31 ENCOUNTER — Encounter: Payer: Self-pay | Admitting: Physician Assistant

## 2023-05-31 VITALS — BP 165/77 | HR 89 | Temp 98.4°F | Wt 154.5 lb

## 2023-05-31 DIAGNOSIS — I70245 Atherosclerosis of native arteries of left leg with ulceration of other part of foot: Secondary | ICD-10-CM

## 2023-05-31 NOTE — Progress Notes (Signed)
 POST OPERATIVE OFFICE NOTE    CC:  F/u for surgery  HPI: Gabrielle [name] is a 68 y.o. female who is here for repeat wound check.  She recently underwent left iliofemoral endarterectomy with femoral to TP trunk bypass complicated by occlusion and subsequent revision with profunda embolectomy and tibioperoneal trunk endarterectomy.  This was done on 03/23/2023 by Dr. Rosalva Comber for critical limb ischemia with left great toe tissue loss.  Postoperatively she had some left groin incisional dehiscence with slough.  We have been closely monitoring the status of her left groin wound.  She returns today for follow-up.  She says that her left groin wound is now completely healed.  Her left great toe tissue loss also looks much better.  She denies any rest pain or claudication.  Her left lower extremity swelling is much better now and is controlled by compression stockings.  She did call our triage line 2 weeks ago after developing a small abdominal wall hematoma, likely after direct trauma to the skin.  She is now back on her Eliquis  and her hematoma is almost gone.  She is wondering if she can discontinue her Eliquis .   Allergies  Allergen Reactions   Hydrocodone Other (See Comments)    Makes her hyper   Bactrim [Sulfamethoxazole-Trimethoprim] Other (See Comments)    hyponatremia    Current Outpatient Medications  Medication Sig Dispense Refill   albuterol  (PROVENTIL ) (2.5 MG/3ML) 0.083% nebulizer solution Take 3 mLs (2.5 mg total) by nebulization every 6 (six) hours as needed for wheezing or shortness of breath. 150 mL 1   albuterol  (VENTOLIN  HFA) 108 (90 Base) MCG/ACT inhaler Inhale 1-2 puffs into the lungs every 6 (six) hours as needed for wheezing or shortness of breath. 8 g 6   apixaban  (ELIQUIS ) 5 MG TABS tablet Take 1 tablet (5 mg total) by mouth 2 (two) times daily. Refills per PCP or cardiology. 60 tablet 2   aspirin  81 MG chewable tablet Chew 81 mg by mouth at bedtime.     atorvastatin   (LIPITOR) 40 MG tablet Take 1 tablet (40 mg total) by mouth daily. (Patient taking differently: Take 40 mg by mouth at bedtime.) 90 tablet 3   BREO ELLIPTA  200-25 MCG/ACT AEPB Inhale 1 puff into the lungs in the morning. 60 each 11   escitalopram  (LEXAPRO ) 20 MG tablet Take 1 tablet (20 mg total) by mouth daily. (Patient taking differently: Take 20 mg by mouth at bedtime.) 90 tablet 1   gabapentin  (NEURONTIN ) 800 MG tablet Take 1 tablet (800 mg total) by mouth daily. (Patient taking differently: Take 800 mg by mouth 2 (two) times daily.) 90 tablet 1   [Paused] lisinopril  (ZESTRIL ) 20 MG tablet Take 1 tablet (20 mg total) by mouth daily. (Patient taking differently: Take 20 mg by mouth at bedtime.) 90 tablet 1   omeprazole  (PRILOSEC) 20 MG capsule Take 20 mg by mouth at bedtime.     ondansetron  (ZOFRAN ) 4 MG tablet Take 4 mg by mouth 3 (three) times daily as needed for nausea or vomiting.     Spacer/Aero-Holding Chambers (VORTEX VALVED HOLDING CHAMBER) DEVI 1 puff by Does not apply route daily.     temazepam  (RESTORIL ) 15 MG capsule Take 15 mg by mouth at bedtime.     traZODone  (DESYREL ) 150 MG tablet Take 150 mg by mouth at bedtime.     No current facility-administered medications for this visit.     ROS:  See HPI  Physical Exam:  Incision: Left groin wound  now completely healed without signs of infection Extremities: Brisk left DP/PT/peroneal Doppler signals.  Nearly healed left great toe ingrown toenail Neuro: Intact motor and sensation of LLE Abdomen: 2x2cm central abdominal wall hematoma without tenderness, resolving surrounding bruising    Assessment/Plan:  This is a 68 y.o. female who is here for repeat wound check  - The patient's left groin wound is now completely healed after incisional dehiscence from groin cutdown - Her left lower extremity is warm and well-perfused with brisk DP/PT/peroneal Doppler signals -Her left great toe ingrown toenail is now almost completely healed.   She denies any rest pain or further tissue loss - Her abdominal wall hematoma appears superficial and is improving.  This was likely due to direct trauma to the skin on Eliquis .  Her 2-week cardiac event monitor results have come back and she is wondering if she can stop her Eliquis  now.  I will double check with Dr.Robins to see if he is okay with the patient stopping Eliquis  - She can follow-up in 1 to 2 months with left lower extremity bypass graft duplex and ABIs   Deneise Finlay, PA-C [facility] [phone]   Call XB:[id number]

## 2023-06-02 ENCOUNTER — Other Ambulatory Visit: Payer: Self-pay | Admitting: Physician Assistant

## 2023-06-02 MED ORDER — APIXABAN 5 MG PO TABS: 5.0000 mg | ORAL_TABLET | Freq: Two times a day (BID) | ORAL | 1 refills | Status: DC

## 2023-06-02 NOTE — Progress Notes (Signed)
 Pt called about her Eliquis .  She was seen in our office on Thursday and inquired about discontinuing her Eliquis  since the results of her heart monitor was finalized.    I have sent 30 day supply of Eliquis  to her pharmacy with one refill.    Dr. Rosalva Comber to review chart next week and make determination about continuing this.     Maryanna Smart, [facility] 06/02/2023 12:16 PM

## 2023-06-04 ENCOUNTER — Other Ambulatory Visit: Payer: Self-pay

## 2023-06-04 DIAGNOSIS — I739 Peripheral vascular disease, unspecified: Secondary | ICD-10-CM

## 2023-06-16 DIAGNOSIS — J4541 Moderate persistent asthma with (acute) exacerbation: Secondary | ICD-10-CM | POA: Diagnosis not present

## 2023-07-09 DIAGNOSIS — H25813 Combined forms of age-related cataract, bilateral: Secondary | ICD-10-CM | POA: Diagnosis not present

## 2023-07-09 DIAGNOSIS — H40011 Open angle with borderline findings, low risk, right eye: Secondary | ICD-10-CM | POA: Diagnosis not present

## 2023-07-09 DIAGNOSIS — H524 Presbyopia: Secondary | ICD-10-CM | POA: Diagnosis not present

## 2023-07-09 DIAGNOSIS — H18511 Endothelial corneal dystrophy, right eye: Secondary | ICD-10-CM | POA: Diagnosis not present

## 2023-07-11 ENCOUNTER — Telehealth: Payer: Self-pay

## 2023-07-11 ENCOUNTER — Other Ambulatory Visit: Payer: Self-pay | Admitting: Physician Assistant

## 2023-07-11 ENCOUNTER — Encounter: Payer: Self-pay | Admitting: Vascular Surgery

## 2023-07-11 MED ORDER — CEPHALEXIN 500 MG PO CAPS: 500.0000 mg | ORAL_CAPSULE | Freq: Two times a day (BID) | ORAL | 0 refills | Status: DC

## 2023-07-11 NOTE — Telephone Encounter (Signed)
 Pt called [facility] triage line c/o red, swollen, hot, sore left lower leg healed incision. Pt reported no drainage and no temperature.  After looking at the picture sent by pt through MyChart, Reggy Capers, PA ordered a round of Keflex . Pt will monitor and call back if the leg doesn't improve.   Pt will keep her follow-up appt 08/02/23.

## 2023-07-11 NOTE — Progress Notes (Signed)
 Pt called with some mild erythema to lower leg incision.  She has not had any fevers or chills.    Picture on epic reviewed.  She does have vein bypass and vein patch angioplasty.    I sent Keflex  500mg  bid x 5 days.  If it does not improve, we will be glad to see her in the office.   Maryanna Smart, [facility] 07/11/2023 9:53 AM

## 2023-07-12 ENCOUNTER — Encounter

## 2023-07-12 ENCOUNTER — Telehealth: Payer: Self-pay

## 2023-07-12 NOTE — Telephone Encounter (Signed)
 Called patient to check on her left lower leg.  Patient reported improvement over night. Patient reported less swelling, warmth and redness. Pt agreed to finish antibiotic and monitor her leg. Pt will call back if needed.

## 2023-07-15 ENCOUNTER — Encounter: Payer: Self-pay | Admitting: Vascular Surgery

## 2023-07-17 DIAGNOSIS — J4541 Moderate persistent asthma with (acute) exacerbation: Secondary | ICD-10-CM | POA: Diagnosis not present

## 2023-08-02 ENCOUNTER — Ambulatory Visit (HOSPITAL_COMMUNITY)
Admission: RE | Admit: 2023-08-02 | Discharge: 2023-08-02 | Disposition: A | Discharge status: Home / Self Care | Source: Ambulatory Visit | Attending: Vascular Surgery | Admitting: Vascular Surgery

## 2023-08-02 ENCOUNTER — Encounter: Payer: Self-pay | Admitting: Physician Assistant

## 2023-08-02 ENCOUNTER — Ambulatory Visit (HOSPITAL_BASED_OUTPATIENT_CLINIC_OR_DEPARTMENT_OTHER)
Admission: RE | Admit: 2023-08-02 | Discharge: 2023-08-02 | Disposition: A | Discharge status: Home / Self Care | Source: Ambulatory Visit | Attending: Vascular Surgery | Admitting: Vascular Surgery

## 2023-08-02 ENCOUNTER — Other Ambulatory Visit: Payer: Self-pay

## 2023-08-02 ENCOUNTER — Ambulatory Visit: Attending: Vascular Surgery | Admitting: Physician Assistant

## 2023-08-02 VITALS — BP 132/67 | HR 78 | Temp 98.2°F | Ht 61.0 in | Wt 149.6 lb

## 2023-08-02 DIAGNOSIS — T82898A Other specified complication of vascular prosthetic devices, implants and grafts, initial encounter: Secondary | ICD-10-CM | POA: Diagnosis not present

## 2023-08-02 DIAGNOSIS — I739 Peripheral vascular disease, unspecified: Secondary | ICD-10-CM | POA: Diagnosis not present

## 2023-08-02 LAB — VAS US ABI WITH/WO TBI
Left ABI: 0.36
Right ABI: 0.77

## 2023-08-02 NOTE — Progress Notes (Signed)
 HISTORY AND PHYSICAL     CC:  follow up. Requesting Provider:  Rosamond Leta NOVAK, MD  HPI: This is a 68 y.o. female who is here today for follow up for PAD.  Pt has hx of  left  iliofemoral endarterectomy, embolectomy, extended onto the profunda with greater saphenous vein patch, Left femoral to tibioperoneal trunk bypass using reverse greater saphenous vein on 03/23/2023 by Dr. Lanis.   Later that day, she had to return to the OR for re-exploration with left sided femoral artery patch takedown, profunda thrombectomy, patch reanastomosis, left-sided distal anastomotic takedown, arteriotomy extension into the peroneal artery, and tibioperoneal trunk endarterectomy also by Dr. Lanis.   Pt was last seen 05/31/2023 and at that time, her groin had completely healed and her left great toe was improved.  She was not having any rest pain or claudication.  LLE swelling was improved and controlled with compression stockings. She was scheduled for follow up  The pt returns today for follow up.  She states that her left foot has been cold and she is having claudication sx.  She can walk about 3/4 city block. She does not have rest pain or ulceration on her foot. She states this started around the time she had some swelling and redness around the lower leg incision around mid June.  She was prescribed Keflex .  It did improve.   The pt is on a statin for cholesterol management.    The pt is on an aspirin .    Other AC:  Eliquis  The pt is on ACEI for hypertension.  The pt is not on diabetic medication. Tobacco hx:  former   Past Medical History:  Diagnosis Date   Anemia    Arthritis    Asthma    Carpal tunnel syndrome    bil   Carpal tunnel syndrome    Chronic pain    Chronic pain in left foot    Complication of anesthesia    states mederate sedation did now worke, was awake   Cough    Depression    espisodic   Drug abuse (HCC)    Dyspnea    with exertion an have failed over night oximenrty - wears  oxygen  at hs   GERD (gastroesophageal reflux disease)    H/O eating disorder    history of Substance abuse (HCC)    Substance abuse in the past.     Hyperlipidemia    Hypertension    Invasive ductal carcinoma of right breast (HCC) 08/21/2012   Right   Legally blind    Lung cancer (HCC) 2016   Orthostatic hypotension    Pneumonia    03-13-17   S/P bilateral breast implants 08/21/2012   Prior to cancer diagnosis    Sleep apnea    Wears 2-3L oxygen  at night   Tobacco abuse 08/21/2012   > 40 pack years    Past Surgical History:  Procedure Laterality Date   ABDOMINAL AORTOGRAM W/LOWER EXTREMITY Left 03/21/2023   Procedure: ABDOMINAL AORTOGRAM W/LOWER EXTREMITY;  Surgeon: Lanis Fonda BRAVO, MD;  Location: [facility] INVASIVE CV LAB;  Service: Cardiovascular;  Laterality: Left;   BREAST LUMPECTOMY Left 1990   benigh / right was cancer   BREAST LUMPECTOMY Right 2014   BREAST LUMPECTOMY WITH SENTINEL LYMPH NODE BIOPSY Right 04/2012   ENDARTERECTOMY FEMORAL Left 03/23/2023   Procedure: ENDARTERECTOMY LEFT FEMORAL;  Surgeon: Lanis Fonda BRAVO, MD;  Location: [facility] OR;  Service: Vascular;  Laterality: Left;   ESOPHAGOGASTRODUODENOSCOPY (EGD) WITH  PROPOFOL  N/A 05/10/2017   Procedure: ESOPHAGOGASTRODUODENOSCOPY (EGD) WITH PROPOFOL ;  Surgeon: Teressa Toribio SQUIBB, MD;  Location: [facility] ENDOSCOPY;  Service: Endoscopy;  Laterality: N/A;   FEMORAL-POPLITEAL BYPASS GRAFT Left 03/23/2023   Procedure: BYPASS GRAFT LEFT FEMORAL-PERONEAL ARTERY;  Surgeon: Lanis Fonda BRAVO, MD;  Location: [facility] OR;  Service: Vascular;  Laterality: Left;   FEMORAL-POPLITEAL BYPASS GRAFT Left 03/23/2023   Procedure: BYPASS REVISION;  Surgeon: Lanis Fonda BRAVO, MD;  Location: [facility] OR;  Service: Vascular;  Laterality: Left;   LOBECTOMY Right 04/20/2014   Procedure: LOBECTOMY, RIGHT UPPER LOBE WITH PLACEMENT OF ONQ PAIN PUMP;  Surgeon: Dallas KATHEE Jude, MD;  Location: [facility] OR;  Service: Thoracic;  Laterality: Right;   LOWER EXTREMITY ANGIOGRAM Left  03/23/2023   Procedure: LOWER EXTREMITY ANGIOGRAM;  Surgeon: Lanis Fonda BRAVO, MD;  Location: [facility] OR;  Service: Vascular;  Laterality: Left;   NODE DISSECTION Right 04/20/2014   Procedure: NODE DISSECTION;  Surgeon: Dallas KATHEE Jude, MD;  Location: [facility] OR;  Service: Thoracic;  Laterality: Right;   PATCH ANGIOPLASTY  03/23/2023   Procedure: CHRISTIANE LISLE ANGIOPLASTY;  Surgeon: Lanis Fonda BRAVO, MD;  Location: [facility] OR;  Service: Vascular;;   PLACEMENT OF BREAST IMPLANTS  1993   PLANTAR FASCIA RELEASE Left 1990s   SHOULDER ARTHROSCOPY WITH SUBACROMIAL DECOMPRESSION, ROTATOR CUFF REPAIR AND BICEP TENDON REPAIR Right 06/27/2017   Procedure: RIGHT SHOULDER ARTHROSCOPY WITH DEBRIDEMENT, SUBACROMIAL DECOMPRESSION AND ROTATOR CUFF REPAIR, BICEP TENODESIS;  Surgeon: Cristy Bonner DASEN, MD;  Location: [facility] OR;  Service: Orthopedics;  Laterality: Right;   TONSILLECTOMY AND ADENOIDECTOMY  1965   VAGINAL HYSTERECTOMY  1990   VEIN HARVEST Left 03/23/2023   Procedure: GREATER SAPHENOUS VEIN HARVEST;  Surgeon: Lanis Fonda BRAVO, MD;  Location: [facility] OR;  Service: Vascular;  Laterality: Left;   VIDEO ASSISTED THORACOSCOPY (VATS)/WEDGE RESECTION Right 04/20/2014   Procedure: VIDEO ASSISTED THORACOSCOPY ;  Surgeon: Dallas KATHEE Jude, MD;  Location: [facility] OR;  Service: Thoracic;  Laterality: Right;   VIDEO BRONCHOSCOPY N/A 04/20/2014   Procedure: VIDEO BRONCHOSCOPY;  Surgeon: Dallas KATHEE Jude, MD;  Location: [facility] OR;  Service: Thoracic;  Laterality: N/A;   WEDGE RESECTION Right 04/20/2014   Procedure: WEDGE RESECTION, RIGHT UPPER LOBE;  Surgeon: Dallas KATHEE Jude, MD;  Location: [facility] OR;  Service: Thoracic;  Laterality: Right;    Allergies  Allergen Reactions   Hydrocodone Other (See Comments)    Makes her hyper   Bactrim [Sulfamethoxazole-Trimethoprim] Other (See Comments)    hyponatremia    Current Outpatient Medications  Medication Sig Dispense Refill   albuterol  (PROVENTIL ) (2.5 MG/3ML) 0.083% nebulizer solution Take 3 mLs (2.5 mg  total) by nebulization every 6 (six) hours as needed for wheezing or shortness of breath. 150 mL 1   albuterol  (VENTOLIN  HFA) 108 (90 Base) MCG/ACT inhaler Inhale 1-2 puffs into the lungs every 6 (six) hours as needed for wheezing or shortness of breath. 8 g 6   apixaban  (ELIQUIS ) 5 MG TABS tablet Take 1 tablet (5 mg total) by mouth 2 (two) times daily. Refills per PCP or cardiology. 60 tablet 1   aspirin  81 MG chewable tablet Chew 81 mg by mouth at bedtime.     atorvastatin  (LIPITOR) 40 MG tablet Take 1 tablet (40 mg total) by mouth daily. (Patient taking differently: Take 40 mg by mouth at bedtime.) 90 tablet 3   BREO ELLIPTA  200-25 MCG/ACT AEPB Inhale 1 puff into the lungs in the morning. 60 each 11   cephALEXin  (KEFLEX ) 500 MG capsule Take  1 capsule (500 mg total) by mouth 2 (two) times daily. 10 capsule 0   escitalopram  (LEXAPRO ) 20 MG tablet Take 1 tablet (20 mg total) by mouth daily. (Patient taking differently: Take 20 mg by mouth at bedtime.) 90 tablet 1   gabapentin  (NEURONTIN ) 800 MG tablet Take 1 tablet (800 mg total) by mouth daily. (Patient taking differently: Take 800 mg by mouth 2 (two) times daily.) 90 tablet 1   [Paused] lisinopril  (ZESTRIL ) 20 MG tablet Take 1 tablet (20 mg total) by mouth daily. (Patient taking differently: Take 20 mg by mouth at bedtime.) 90 tablet 1   omeprazole  (PRILOSEC) 20 MG capsule Take 20 mg by mouth at bedtime.     ondansetron  (ZOFRAN ) 4 MG tablet Take 4 mg by mouth 3 (three) times daily as needed for nausea or vomiting.     Spacer/Aero-Holding Chambers (VORTEX VALVED HOLDING CHAMBER) DEVI 1 puff by Does not apply route daily.     temazepam  (RESTORIL ) 15 MG capsule Take 15 mg by mouth at bedtime.     traZODone  (DESYREL ) 150 MG tablet Take 150 mg by mouth at bedtime.     No current facility-administered medications for this visit.    Family History  Problem Relation Age of Onset   Hypertension Mother    Alcohol  abuse Mother    Arthritis Mother     COPD Mother    Depression Mother    Heart disease Mother    Hyperlipidemia Mother    Mental illness Mother    Hypertension Father    Alcohol  abuse Father    Hearing loss Father    Heart attack Father    Heart disease Father    Hyperlipidemia Father    Stroke Father    Kidney disease Father    Arthritis Father    Depression Brother    Mental illness Brother    Hepatitis C Brother    Hepatitis C Brother    Alcohol  abuse Sister    Depression Sister    Mental illness Sister    Lymphoma Maternal Aunt 55   Leukemia Maternal Grandmother        dx in her late 50s   Alcohol  abuse Maternal Grandmother    Hearing loss Maternal Grandmother    Heart disease Maternal Grandfather    Heart disease Paternal Grandmother    Birth defects Daughter     Social History   Socioeconomic History   Marital status: Divorced    Spouse name: Not on file   Number of children: 1   Years of education: Not on file   Highest education level: Not on file  Occupational History    Employer: [employer]  Tobacco Use   Smoking status: Former    Current packs/day: 0.00    Average packs/day: 1 pack/day for 43.2 years (43.2 ttl pk-yrs)    Types: Cigarettes    Start date: 18    Quit date: 04/20/2014    Years since quitting: 9.2   Smokeless tobacco: Never  Vaping Use   Vaping status: Never Used  Substance and Sexual Activity   Alcohol  use: Yes    Alcohol /week: 3.0 standard drinks of alcohol     Types: 3 Cans of beer per week   Drug use: No    Comment: history of substance abuse - none now-    Sexual activity: Not Currently  Other Topics Concern   Not on file  Social History Narrative   Marital status/children/pets: Divorced.   Education/employment: 2 years  of college.  Works as a Museum/gallery exhibitions officer.      Social Drivers of Corporate investment banker Strain: Low Risk  (06/15/2017)   Received from [facility]   Overall Financial Resource Strain (CARDIA)    Difficulty  of Paying Living Expenses: Not hard at all  Food Insecurity: No Food Insecurity (03/24/2023)   Hunger Vital Sign    Worried About Running Out of Food in the Last Year: Never true    Ran Out of Food in the Last Year: Never true  Transportation Needs: No Transportation Needs (03/24/2023)   PRAPARE - Administrator, Civil Service (Medical): No    Lack of Transportation (Non-Medical): No  Physical Activity: Sufficiently Active (06/15/2017)   Received from [facility]   Exercise Vital Sign    Days of Exercise per Week: 7 days    Minutes of Exercise per Session: 30 min  Stress: No Stress Concern Present (06/15/2017)   Received from [facility] of Occupational Health - Occupational Stress Questionnaire    Feeling of Stress : Not at all  Social Connections: Moderately Isolated (03/25/2023)   Social Connection and Isolation Panel    Frequency of Communication with Friends and Family: More than three times a week    Frequency of Social Gatherings with Friends and Family: Not on file    Attends Religious Services: Never    Database administrator or Organizations: No    Attends Banker Meetings: Never    Marital Status: Living with partner  Intimate Partner Violence: Not At Risk (03/24/2023)   Humiliation, Afraid, Rape, and Kick questionnaire    Fear of Current or Ex-Partner: No    Emotionally Abused: No    Physically Abused: No    Sexually Abused: No     REVIEW OF SYSTEMS:   [X]  denotes positive finding, [ ]  denotes negative finding Cardiac  Comments:  Chest pain or chest pressure:    Shortness of breath upon exertion:    Short of breath when lying flat:    Irregular heart rhythm:        Vascular    Pain in calf, thigh, or hip brought on by ambulation:    Pain in feet at night that wakes you up from your sleep:     Blood clot in your veins:    Leg swelling:         Pulmonary    Oxygen  at home:    Productive cough:     Wheezing:          Neurologic    Sudden weakness in arms or legs:     Sudden numbness in arms or legs:     Sudden onset of difficulty speaking or slurred speech:    Temporary loss of vision in one eye:     Problems with dizziness:         Gastrointestinal    Blood in stool:     Vomited blood:         Genitourinary    Burning when urinating:     Blood in urine:        Psychiatric    Major depression:         Hematologic    Bleeding problems:    Problems with blood clotting too easily:        Skin    Rashes or ulcers:        Constitutional  Fever or chills:      PHYSICAL EXAMINATION:  Today's Vitals   08/02/23 1501  BP: 132/67  Pulse: 78  Temp: 98.2 F (36.8 C)  TempSrc: Temporal  SpO2: 92%  Weight: 149 lb 9.6 oz (67.9 kg)  Height: 5' 1 (1.549 m)  PainSc: 0-No pain   Body mass index is 28.27 kg/m.   General:  WDWN in NAD; vital signs documented above Gait: Not observed HENT: WNL, normocephalic Pulmonary: normal non-labored breathing , without wheezing Cardiac: regular HR, without carotid bruits Abdomen: soft Skin: without rashes Vascular Exam/Pulses: Left foot pulses are not palpable; left femoral pulse is palpable Extremities: without ischemic changes, without Gangrene , without cellulitis; without open wounds; left foot slightly cooler than the right Musculoskeletal: no muscle wasting or atrophy  Neurologic: A&O X 3 Psychiatric:  The pt has Normal affect.   Non-Invasive Vascular Imaging:   ABI's/TBI's on 08/02/2023: Right:  0.77/0.55 - Great toe pressure: 68 Left:  0.36/0 - Great toe pressure: 0  Arterial duplex on 08/02/2023: Right Graft #1: CFA to peroneal  +------------------+--------+--------+----------+--------+                   PSV cm/sStenosisWaveform  Comments  +------------------+--------+--------+----------+--------+  Inflow           78              monophasic          +------------------+--------+--------+----------+--------+  Prox  Anastomosis  33              monophasic          +------------------+--------+--------+----------+--------+  Proximal Graft            occluded                    +------------------+--------+--------+----------+--------+  Mid Graft                 occluded                    +------------------+--------+--------+----------+--------+  Distal Graft              occluded                    +------------------+--------+--------+----------+--------+  Distal Anastomosis                          NV        +------------------+--------+--------+----------+--------+  Outflow                                    NV        +------------------+--------+--------+----------+--------+   +-----------+--------+-----+--------+----------+-------------------+  LEFT      PSV cm/sRatioStenosisWaveform  Comments             +-----------+--------+-----+--------+----------+-------------------+  ATA Distal 19                   monophasic                     +-----------+--------+-----+--------+----------+-------------------+  PTA Distal 6                    monophasic                     +-----------+--------+-----+--------+----------+-------------------+  PERO Distal  No flow appreciated  +-----------+--------+-----+--------+----------+-------------------+    Summary:  Left: Occluded graft.   Previous ABI's/TBI's on 03/15/2023: Right:  0.75/0.52 - Great toe pressure: 98 Left:  0.32/0 - Great toe pressure:  0    ASSESSMENT/PLAN:: 68 y.o. female here for follow up for PAD with hx of left  iliofemoral endarterectomy, embolectomy, extended onto the profunda with greater saphenous vein patch, Left femoral to tibioperoneal trunk bypass using reverse greater saphenous vein on 03/23/2023 by Dr. Lanis.   Later that day, she had to return to the OR for re-exploration with left sided femoral artery patch takedown, profunda  thrombectomy, patch reanastomosis, left-sided distal anastomotic takedown, arteriotomy extension into the peroneal artery, and tibioperoneal trunk endarterectomy also by Dr. Lanis.    -pt with occluded LLE bypass graft most likely down for about 3 weeks.  Fortunately she does not have rest pain or ulceration.   -pt has not missed any Eliquis  doses until this morning.   -pt seen with Dr. Lanis and will plan for angiogram with possible thrombolytics on 08/07/2023 with Dr. Serene  -continue asa/statin.     Lucie Apt, [facility] [phone]  Clinic MD:   Lanis

## 2023-08-07 ENCOUNTER — Encounter (HOSPITAL_COMMUNITY)
Admission: RE | Disposition: A | Discharge status: Home / Self Care | Payer: Self-pay | Source: Home / Self Care | Attending: Surgery

## 2023-08-07 ENCOUNTER — Other Ambulatory Visit: Payer: Self-pay

## 2023-08-07 ENCOUNTER — Ambulatory Visit (HOSPITAL_COMMUNITY)
Admission: RE | Admit: 2023-08-07 | Discharge: 2023-08-07 | Disposition: A | Discharge status: Home / Self Care | Attending: Surgery | Admitting: Surgery

## 2023-08-07 DIAGNOSIS — T82858A Stenosis of vascular prosthetic devices, implants and grafts, initial encounter: Secondary | ICD-10-CM | POA: Diagnosis not present

## 2023-08-07 DIAGNOSIS — Y832 Surgical operation with anastomosis, bypass or graft as the cause of abnormal reaction of the patient, or of later complication, without mention of misadventure at the time of the procedure: Secondary | ICD-10-CM | POA: Insufficient documentation

## 2023-08-07 DIAGNOSIS — T82898A Other specified complication of vascular prosthetic devices, implants and grafts, initial encounter: Secondary | ICD-10-CM | POA: Diagnosis not present

## 2023-08-07 DIAGNOSIS — I70212 Atherosclerosis of native arteries of extremities with intermittent claudication, left leg: Secondary | ICD-10-CM

## 2023-08-07 DIAGNOSIS — Z7982 Long term (current) use of aspirin: Secondary | ICD-10-CM | POA: Insufficient documentation

## 2023-08-07 DIAGNOSIS — Z79899 Other long term (current) drug therapy: Secondary | ICD-10-CM | POA: Insufficient documentation

## 2023-08-07 DIAGNOSIS — Z87891 Personal history of nicotine dependence: Secondary | ICD-10-CM | POA: Insufficient documentation

## 2023-08-07 DIAGNOSIS — Z7901 Long term (current) use of anticoagulants: Secondary | ICD-10-CM | POA: Insufficient documentation

## 2023-08-07 LAB — POCT I-STAT, CHEM 8
BUN: 9 mg/dL (ref 8–23)
Calcium, Ion: 1.15 mmol/L (ref 1.15–1.40)
Chloride: 100 mmol/L (ref 98–111)
Creatinine, Ser: 0.7 mg/dL (ref 0.44–1.00)
Glucose, Bld: 73 mg/dL (ref 70–99)
HCT: 33 % — ABNORMAL LOW (ref 36.0–46.0)
Hemoglobin: 11.2 g/dL — ABNORMAL LOW (ref 12.0–15.0)
Potassium: 4.1 mmol/L (ref 3.5–5.1)
Sodium: 135 mmol/L (ref 135–145)
TCO2: 25 mmol/L (ref 22–32)

## 2023-08-07 SURGERY — ABDOMINAL AORTOGRAM
Anesthesia: LOCAL

## 2023-08-07 MED ORDER — MIDAZOLAM HCL 2 MG/2ML IJ SOLN
INTRAMUSCULAR | Status: DC | PRN
  Administered 2023-08-07 (×5): 1 mg via INTRAVENOUS

## 2023-08-07 MED ORDER — ONDANSETRON HCL 4 MG/2ML IJ SOLN: 4.0000 mg | Freq: Four times a day (QID) | INTRAMUSCULAR | Status: DC | PRN

## 2023-08-07 MED ORDER — FENTANYL CITRATE (PF) 100 MCG/2ML IJ SOLN
INTRAMUSCULAR | Status: DC | PRN
  Administered 2023-08-07 (×2): 25 ug via INTRAVENOUS
  Administered 2023-08-07: 50 ug via INTRAVENOUS
  Administered 2023-08-07 (×2): 25 ug via INTRAVENOUS

## 2023-08-07 MED ORDER — SODIUM CHLORIDE 0.9% FLUSH: 3.0000 mL | INTRAVENOUS | Status: DC | PRN

## 2023-08-07 MED ORDER — MIDAZOLAM HCL 2 MG/2ML IJ SOLN
INTRAMUSCULAR | Status: AC
  Filled 2023-08-07: qty 2

## 2023-08-07 MED ORDER — ASPIRIN 81 MG PO CHEW
CHEWABLE_TABLET | ORAL | Status: AC
  Filled 2023-08-07: qty 1

## 2023-08-07 MED ORDER — FENTANYL CITRATE (PF) 100 MCG/2ML IJ SOLN
INTRAMUSCULAR | Status: AC
  Filled 2023-08-07: qty 2

## 2023-08-07 MED ORDER — SODIUM CHLORIDE 0.9% FLUSH: 3.0000 mL | Freq: Two times a day (BID) | INTRAVENOUS | Status: DC

## 2023-08-07 MED ORDER — IODIXANOL 320 MG/ML IV SOLN
INTRAVENOUS | Status: DC | PRN
  Administered 2023-08-07: 100 mL

## 2023-08-07 MED ORDER — PROTAMINE SULFATE 10 MG/ML IV SOLN
INTRAVENOUS | Status: AC
Start: 2023-08-07 — End: 2023-08-07
  Filled 2023-08-07: qty 5

## 2023-08-07 MED ORDER — SODIUM CHLORIDE 0.9 % WEIGHT BASED INFUSION: 1.0000 mL/kg/h | INTRAVENOUS | Status: DC

## 2023-08-07 MED ORDER — ASPIRIN 81 MG PO CHEW
CHEWABLE_TABLET | ORAL | Status: DC | PRN
  Administered 2023-08-07: 81 mg via ORAL

## 2023-08-07 MED ORDER — HYDRALAZINE HCL 20 MG/ML IJ SOLN: 5.0000 mg | INTRAMUSCULAR | Status: DC | PRN

## 2023-08-07 MED ORDER — LIDOCAINE HCL (PF) 1 % IJ SOLN
INTRAMUSCULAR | Status: DC | PRN
  Administered 2023-08-07: 15 mL

## 2023-08-07 MED ORDER — HYDRALAZINE HCL 20 MG/ML IJ SOLN
INTRAMUSCULAR | Status: DC | PRN
  Administered 2023-08-07: 10 mg via INTRAVENOUS

## 2023-08-07 MED ORDER — ACETAMINOPHEN 325 MG PO TABS
650.0000 mg | ORAL_TABLET | ORAL | Status: DC | PRN
Start: 2023-08-07 — End: 2023-08-08
  Administered 2023-08-07: 650 mg via ORAL
  Filled 2023-08-07: qty 2

## 2023-08-07 MED ORDER — ASPIRIN 81 MG PO TBEC: 81.0000 mg | DELAYED_RELEASE_TABLET | Freq: Every day | ORAL | Status: DC

## 2023-08-07 MED ORDER — HEPARIN SODIUM (PORCINE) 1000 UNIT/ML IJ SOLN
INTRAMUSCULAR | Status: DC | PRN
Start: 2023-08-07 — End: 2023-08-08
  Administered 2023-08-07: 1000 [IU] via INTRAVENOUS
  Administered 2023-08-07: 6500 [IU] via INTRAVENOUS

## 2023-08-07 MED ORDER — SODIUM CHLORIDE 0.9 % IV SOLN: 250.0000 mL | INTRAVENOUS | Status: DC | PRN

## 2023-08-07 MED ORDER — SODIUM CHLORIDE 0.9 % IV BOLUS
INTRAVENOUS | Status: DC | PRN
  Administered 2023-08-07: 250 mL via INTRAVENOUS

## 2023-08-07 MED ORDER — HYDRALAZINE HCL 20 MG/ML IJ SOLN
INTRAMUSCULAR | Status: AC
Start: 2023-08-07 — End: 2023-08-07
  Filled 2023-08-07: qty 1

## 2023-08-07 MED ORDER — HEPARIN (PORCINE) IN NACL 2000-0.9 UNIT/L-% IV SOLN
INTRAVENOUS | Status: DC | PRN
  Administered 2023-08-07: 1000 mL

## 2023-08-07 MED ORDER — MORPHINE SULFATE (PF) 2 MG/ML IV SOLN: 2.0000 mg | INTRAVENOUS | Status: DC | PRN

## 2023-08-07 MED ORDER — OXYCODONE HCL 5 MG PO TABS: 5.0000 mg | ORAL_TABLET | ORAL | Status: DC | PRN

## 2023-08-07 MED ORDER — LABETALOL HCL 5 MG/ML IV SOLN: 10.0000 mg | INTRAVENOUS | Status: DC | PRN

## 2023-08-07 MED ORDER — CLOPIDOGREL BISULFATE 75 MG PO TABS
ORAL_TABLET | ORAL | Status: AC
  Filled 2023-08-07: qty 1

## 2023-08-07 MED ORDER — CLOPIDOGREL BISULFATE 300 MG PO TABS
ORAL_TABLET | ORAL | Status: DC | PRN
  Administered 2023-08-07: 75 mg via ORAL

## 2023-08-07 MED ORDER — SODIUM CHLORIDE 0.9 % IV SOLN: INTRAVENOUS | Status: DC

## 2023-08-07 MED ORDER — SODIUM CHLORIDE 0.9 % IV SOLN
INTRAVENOUS | Status: DC | PRN
  Administered 2023-08-07: 100 mL/h via INTRAVENOUS

## 2023-08-07 MED ORDER — LIDOCAINE HCL (PF) 1 % IJ SOLN
INTRAMUSCULAR | Status: AC
  Filled 2023-08-07: qty 30

## 2023-08-07 MED ORDER — PROTAMINE SULFATE 10 MG/ML IV SOLN
INTRAVENOUS | Status: DC | PRN
Start: 2023-08-07 — End: 2023-08-08
  Administered 2023-08-07: 5 mg via INTRAVENOUS
  Administered 2023-08-07: 20 mg via INTRAVENOUS

## 2023-08-07 SURGICAL SUPPLY — 22 items
BALLOON MUSTANG 4X40X135 (BALLOONS) IMPLANT
BALLOON MUSTANG 6X200X135 (BALLOONS) IMPLANT
CATH ANGIO 5F BER2 65CM (CATHETERS) IMPLANT
CATH MUSTANG 4X200X135 (BALLOONS) IMPLANT
CATH OMNI FLUSH 5F 65CM (CATHETERS) IMPLANT
CATH QUICKCROSS SUPP .035X90CM (MICROCATHETER) IMPLANT
CLOSURE MYNX CONTROL 6F/7F (Vascular Products) IMPLANT
DEVICE TORQUE .025-.038 (MISCELLANEOUS) IMPLANT
DEVICE TORQUE SEADRAGON GRN (MISCELLANEOUS) IMPLANT
GUIDEWIRE ANGLED .035X150CM (WIRE) IMPLANT
KIT ENCORE 26 ADVANTAGE (KITS) IMPLANT
KIT MICROPUNCTURE NIT STIFF (SHEATH) IMPLANT
SET ATX-X65L (MISCELLANEOUS) IMPLANT
SHEATH CATAPULT 6FR 45 (SHEATH) IMPLANT
SHEATH PINNACLE 5F 10CM (SHEATH) IMPLANT
SHEATH PINNACLE 6F 10CM (SHEATH) IMPLANT
SHEATH PROBE COVER 6X72 (BAG) IMPLANT
STENT ELUVIA 7X150X130 (Permanent Stent) IMPLANT
TRAY PV CATH (CUSTOM PROCEDURE TRAY) ×1 IMPLANT
WIRE BENTSON .035X145CM (WIRE) IMPLANT
WIRE HI TORQ VERSACORE 300 (WIRE) IMPLANT
WIRE SPARTACORE .014X300CM (WIRE) IMPLANT

## 2023-08-07 NOTE — Progress Notes (Signed)
 Femstop right groin removed.Right groin bruised linear to puncture site, around site and right labia. Hematoma semisoft medially. No oozing. Dressed w/gauze and tegaderm. Dopplered rt dp.

## 2023-08-07 NOTE — Op Note (Signed)
 Patient name: Gabrielle [name] MRN: [id number] DOB: Jun 06, 1955 Sex: female  08/07/2023 Pre-operative Diagnosis: Occluded left femoral tibial bypass Post-operative diagnosis:  Same Surgeon:  Malvina New Procedure Performed:  1.  Ultrasound-guided access, right femoral artery  2.  Abdominal aortogram with bifemoral runoff  3.  Left leg angiogram  4.  Selective injection with catheter in left popliteal artery  5.  Stent, left superficial femoral artery  6.  Balloon angioplasty left popliteal artery  7.  Conscious sedation, 72 minutes  8.  Closure device, Mynx   Indications: This is a 68 year old female with history of femoral tibioperoneal trunk bypass graft for a wound which has healed.  She is now having claudication symptoms.  Ultrasound showed that her bypass was occluded.  She comes in today for further evaluation and possible intervention.  Procedure:  The patient was identified in the holding area and taken to room 8.  The patient was then placed supine on the table and prepped and draped in the usual sterile fashion.  A time out was called.  Conscious sedation was administered with the use of IV fentanyl  and Versed  under continuous physician and nurse monitoring.  Heart rate, blood pressure, and oxygen  saturation were continuously monitored.  Total sedation time was 72 minutes.  Ultrasound was used to evaluate the right common femoral artery.  It was patent .  A digital ultrasound image was acquired.  A micropuncture needle was used to access the right common femoral artery under ultrasound guidance.  An 018 wire was advanced without resistance and a micropuncture sheath was placed.  The 018 wire was removed and a benson wire was placed.  The micropuncture sheath was exchanged for a 5 french sheath.  An omniflush catheter was advanced over the wire to the level of L-1.  An abdominal angiogram was obtained.  Next, using the omniflush catheter and a benson wire, the aortic bifurcation  was crossed and the catheter was placed into theleft external iliac artery and left runoff was obtained.    Findings:   Aortogram: No significant renal artery stenosis was visualized.  The infrarenal abdominal aorta is widely patent.  Bilateral common and external iliac arteries are widely patent.  Bilateral common femoral arteries widely patent  Left Lower Extremity: Patulous dilatation of the left common femoral artery is noted without stenosis.  The profundofemoral artery is widely patent.  The superficial femoral and the bypass graft are occluded.  There is reconstitution of the above-knee popliteal artery.  There is luminal irregularity with possible atherosclerotic plaque at the joint space.  There is two-vessel runoff to the ankle via the peroneal and anterior tibial artery which were small and diseased.  The anterior tibial occludes at the ankle.  Intervention: After the above images were acquired the decision was made to proceed with intervention.  A 6 French 45 cm sheath was advanced into the left external iliac artery.  The patient was fully heparinized.  Using a Berenstein 2 catheter and a Glidewire, I tried to gain access into the bypass graft however I was unable to locate it.  Ultimately however I was able to get into the native superficial femoral artery.  Subintimal recanalization was then performed.  Reentry was into the above-knee popliteal artery as confirmed by contrast injection with the catheter at this level.  A versa core wire was then placed.  The subintimal tract was dilated with a 4 mm balloon.  I then placed overlapping 7 x 150 Eluvia stents to  stent from the origin of the superficial femoral artery to the above-knee popliteal artery.  These were postdilated with a 6 mm balloon.  I then treated the plaque in the joint space with a 4 mm Mustang balloon.  Completion imaging was then performed which showed inline flow throughout the superficial femoral and popliteal artery.  There was  no change in runoff.  I was satisfied with these results.  The long sheath was exchanged out for short 6 French sheath and the groin was closed with a Mynx  Impression:  #1  Left femoral to tibioperoneal trunk bypass graft was found to be occluded.  It was unable to be recanalized however I was able to recanalize the superficial femoral and popliteal artery and treat with overlapping 7 x 150 Eluvia stents  #2  Atherosclerotic plaque within the joint space of the left popliteal artery was treated with a 4 mm balloon  #3  2 vessel runoff to the ankle via the peroneal and anterior tibial artery.  #4  Patient will continue aspirin  and Eliquis  however this potentially could be changed to aspirin  and Plavix  in addition to her statin    V. Malvina New, M.D., [facility] [place] Office: [phone] Pager:  [phone]

## 2023-08-07 NOTE — Progress Notes (Signed)
 Status post left SFA stenting. Right groin closed with a Mynx.  There was oozing from the cannulation site despite manual pressure being held for 20 minutes.  We elected to place a FemoStop on the area for 2 hours and give protamine .  I evaluated the patient in the recovery room.  The FemoStop is in place.  There is no hematoma.  We will remove this shortly and then get her to short stay and hopefully plan on discharge home later this evening.  Gabrielle [name]

## 2023-08-07 NOTE — Discharge Instructions (Addendum)
 Restart Eliquis  tomorrow pm Femoral Site Care This sheet gives you information about how to care for yourself after your procedure. Your health care provider may also give you more specific instructions. If you have problems or questions, contact your health care provider. What can I expect after the procedure?  After the procedure, it is common to have: Bruising that usually fades within 1-2 weeks. Tenderness at the site. Follow these instructions at home: Wound care Follow instructions from your health care provider about how to take care of your insertion site. Make sure you: Wash your hands with soap and water  before you change your bandage (dressing). If soap and water  are not available, use hand sanitizer. Remove your dressing as told by your health care provider. In 24 hours Do not take baths, swim, or use a hot tub until your health care provider approves. You may shower 24-48 hours after the procedure or as told by your health care provider. Gently wash the site with plain soap and water . Pat the area dry with a clean towel. Do not rub the site. This may cause bleeding. Do not apply powder or lotion to the site. Keep the site clean and dry. Check your femoral site every day for signs of infection. Check for: Redness, swelling, or pain. Fluid or blood. Warmth. Pus or a bad smell. Activity For the first 2-3 days after your procedure, or as long as directed: Avoid climbing stairs as much as possible. Do not squat. Do not lift anything that is heavier than 10 lb (4.5 kg), or the limit that you are told, until your health care provider says that it is safe. For 5 days Rest as directed. Avoid sitting for a long time without moving. Get up to take short walks every 1-2 hours. Do not drive for 24 hours if you were given a medicine to help you relax (sedative). General instructions Take over-the-counter and prescription medicines only as told by your health care provider. Keep all  follow-up visits as told by your health care provider. This is important. Contact a health care provider if you have: A fever or chills. You have redness, swelling, or pain around your insertion site. Get help right away if: The catheter insertion area swells very fast. You pass out. You suddenly start to sweat or your skin gets clammy. The catheter insertion area is bleeding, and the bleeding does not stop when you hold steady pressure on the area. The area near or just beyond the catheter insertion site becomes pale, cool, tingly, or numb. These symptoms may represent a serious problem that is an emergency. Do not wait to see if the symptoms will go away. Get medical help right away. Call your local emergency services (911 in the [place] Do not drive yourself to the hospital. Summary After the procedure, it is common to have bruising that usually fades within 1-2 weeks. Check your femoral site every day for signs of infection. Do not lift anything that is heavier than 10 lb (4.5 kg), or the limit that you are told, until your health care provider says that it is safe. This information is not intended to replace advice given to you by your health care provider. Make sure you discuss any questions you have with your health care provider. Document Revised: 01/22/2017 Document Reviewed: 01/22/2017 Elsevier Patient Education  2020 [employer].

## 2023-08-07 NOTE — H&P (View-Only) (Signed)
 Status post left SFA stenting. Right groin closed with a Mynx.  There was oozing from the cannulation site despite manual pressure being held for 20 minutes.  We elected to place a FemoStop on the area for 2 hours and give protamine .  I evaluated the patient in the recovery room.  The FemoStop is in place.  There is no hematoma.  We will remove this shortly and then get her to short stay and hopefully plan on discharge home later this evening.  Gabrielle [name]

## 2023-08-08 ENCOUNTER — Encounter (HOSPITAL_COMMUNITY): Payer: Self-pay | Admitting: Surgery

## 2023-08-09 NOTE — Interval H&P Note (Signed)
 History and Physical Interval Note:  08/09/2023 10:13 AM  Gabrielle [name]  has presented today for surgery, with the diagnosis of accluded left bypass graft.  The various methods of treatment have been discussed with the patient and family. After consideration of risks, benefits and other options for treatment, the patient has consented to  Procedure(s): ABDOMINAL AORTOGRAM (N/A) Lower Extremity Angiography (N/A) PERIPHERAL VASCULAR THROMBECTOMY (N/A) as a surgical intervention.  The patient's history has been reviewed, patient examined, no change in status, stable for surgery.  I have reviewed the patient's chart and labs.  Questions were answered to the patient's satisfaction.     Malvina New

## 2023-08-16 DIAGNOSIS — J4541 Moderate persistent asthma with (acute) exacerbation: Secondary | ICD-10-CM | POA: Diagnosis not present

## 2023-08-20 DIAGNOSIS — R21 Rash and other nonspecific skin eruption: Secondary | ICD-10-CM | POA: Diagnosis not present

## 2023-08-20 DIAGNOSIS — B029 Zoster without complications: Secondary | ICD-10-CM | POA: Diagnosis not present

## 2023-08-22 DIAGNOSIS — B0239 Other herpes zoster eye disease: Secondary | ICD-10-CM | POA: Diagnosis not present

## 2023-08-23 ENCOUNTER — Other Ambulatory Visit: Payer: Self-pay

## 2023-08-23 DIAGNOSIS — I70245 Atherosclerosis of native arteries of left leg with ulceration of other part of foot: Secondary | ICD-10-CM

## 2023-08-27 DIAGNOSIS — I10 Essential (primary) hypertension: Secondary | ICD-10-CM | POA: Diagnosis not present

## 2023-08-27 DIAGNOSIS — R42 Dizziness and giddiness: Secondary | ICD-10-CM | POA: Diagnosis not present

## 2023-08-27 DIAGNOSIS — Z299 Encounter for prophylactic measures, unspecified: Secondary | ICD-10-CM | POA: Diagnosis not present

## 2023-08-27 DIAGNOSIS — J449 Chronic obstructive pulmonary disease, unspecified: Secondary | ICD-10-CM | POA: Diagnosis not present

## 2023-08-27 DIAGNOSIS — R52 Pain, unspecified: Secondary | ICD-10-CM | POA: Diagnosis not present

## 2023-08-27 DIAGNOSIS — B0229 Other postherpetic nervous system involvement: Secondary | ICD-10-CM | POA: Diagnosis not present

## 2023-08-29 ENCOUNTER — Other Ambulatory Visit: Payer: Self-pay | Admitting: Surgery

## 2023-08-29 DIAGNOSIS — I70245 Atherosclerosis of native arteries of left leg with ulceration of other part of foot: Secondary | ICD-10-CM

## 2023-09-16 DIAGNOSIS — J4541 Moderate persistent asthma with (acute) exacerbation: Secondary | ICD-10-CM | POA: Diagnosis not present

## 2023-09-17 ENCOUNTER — Ambulatory Visit: Attending: Surgery | Admitting: Physician Assistant

## 2023-09-17 ENCOUNTER — Ambulatory Visit (HOSPITAL_BASED_OUTPATIENT_CLINIC_OR_DEPARTMENT_OTHER)
Admission: RE | Admit: 2023-09-17 | Discharge: 2023-09-17 | Disposition: A | Discharge status: Home / Self Care | Source: Ambulatory Visit | Attending: Surgery | Admitting: Surgery

## 2023-09-17 ENCOUNTER — Ambulatory Visit (HOSPITAL_COMMUNITY)
Admission: RE | Admit: 2023-09-17 | Discharge: 2023-09-17 | Disposition: A | Discharge status: Home / Self Care | Source: Ambulatory Visit | Attending: Surgery | Admitting: Surgery

## 2023-09-17 VITALS — BP 111/74 | HR 76 | Temp 97.7°F | Wt 152.1 lb

## 2023-09-17 DIAGNOSIS — I70245 Atherosclerosis of native arteries of left leg with ulceration of other part of foot: Secondary | ICD-10-CM | POA: Diagnosis not present

## 2023-09-17 DIAGNOSIS — I739 Peripheral vascular disease, unspecified: Secondary | ICD-10-CM | POA: Diagnosis not present

## 2023-09-17 LAB — VAS US ABI WITH/WO TBI
Left ABI: 0.84
Right ABI: 0.67

## 2023-09-17 NOTE — Progress Notes (Signed)
 Office Note   History of Present Illness   Gabrielle [name] is a 68 y.o. (1955/05/20) female who presents for post angio follow up.  She recently underwent left lower extremity angiogram with left SFA stenting and left popliteal artery balloon angioplasty on 08/07/2023 by Dr. Serene.  This was done for an occluded femoral-TP trunk bypass with claudication.  Her bypass could not be recanalized during her angiogram.  Angiography demonstrated two-vessel runoff to the ankle via peroneal and anterior tibial artery.   She returns today for follow-up.  She says that she is feeling much better after her procedure.  She no longer has any claudication in her left leg.  She also denies any rest pain or tissue loss.  She does have some easy bruising on the Eliquis  and aspirin , however this is tolerable.  She is currently taking a daily aspirin , Eliquis , and statin.  Current Outpatient Medications  Medication Sig Dispense Refill   acetaminophen  (TYLENOL ) 500 MG tablet Take 1,000 mg by mouth every 6 (six) hours as needed for moderate pain (pain score 4-6).     albuterol  (PROVENTIL ) (2.5 MG/3ML) 0.083% nebulizer solution Take 3 mLs (2.5 mg total) by nebulization every 6 (six) hours as needed for wheezing or shortness of breath. 150 mL 1   albuterol  (VENTOLIN  HFA) 108 (90 Base) MCG/ACT inhaler Inhale 1-2 puffs into the lungs every 6 (six) hours as needed for wheezing or shortness of breath. 8 g 6   apixaban  (ELIQUIS ) 5 MG TABS tablet Take 1 tablet (5 mg total) by mouth 2 (two) times daily. Refills per PCP or cardiology. 60 tablet 1   aspirin  81 MG chewable tablet Chew 81 mg by mouth at bedtime.     atorvastatin  (LIPITOR) 40 MG tablet Take 1 tablet (40 mg total) by mouth daily. (Patient taking differently: Take 40 mg by mouth at bedtime.) 90 tablet 3   BREO ELLIPTA  200-25 MCG/ACT AEPB Inhale 1 puff into the lungs in the morning. (Patient taking differently: Inhale 1 puff into the lungs daily as needed  (shortness).) 60 each 11   Calcium  Carb-Cholecalciferol (CALCIUM  600 + D PO) Take 2 tablets by mouth at bedtime.     escitalopram  (LEXAPRO ) 20 MG tablet Take 1 tablet (20 mg total) by mouth daily. (Patient taking differently: Take 20 mg by mouth at bedtime.) 90 tablet 1   gabapentin  (NEURONTIN ) 800 MG tablet Take 1 tablet (800 mg total) by mouth daily. (Patient taking differently: Take 800 mg by mouth daily as needed (pain).) 90 tablet 1   hydrOXYzine  (ATARAX ) 50 MG tablet Take 50 mg by mouth at bedtime.     lisinopril  (ZESTRIL ) 20 MG tablet Take 1 tablet (20 mg total) by mouth daily. (Patient taking differently: Take 20 mg by mouth at bedtime.) 90 tablet 1   Metoprolol  Succinate 50 MG CS24 Take 50 mg by mouth daily.     omeprazole  (PRILOSEC) 20 MG capsule Take 20 mg by mouth at bedtime.     ondansetron  (ZOFRAN ) 4 MG tablet Take 4 mg by mouth 3 (three) times daily as needed for nausea or vomiting.     promethazine  (PHENERGAN ) 25 MG tablet Take 25 mg by mouth every 8 (eight) hours as needed for nausea or vomiting.     Spacer/Aero-Holding Chambers (VORTEX VALVED HOLDING CHAMBER) DEVI 1 puff by Does not apply route daily.     temazepam  (RESTORIL ) 15 MG capsule Take 15 mg by mouth at bedtime as needed for sleep.  traZODone  (DESYREL ) 150 MG tablet Take 150 mg by mouth at bedtime.     No current facility-administered medications for this visit.    REVIEW OF SYSTEMS (negative unless checked):   Cardiac:  []  Chest pain or chest pressure? []  Shortness of breath upon activity? []  Shortness of breath when lying flat? []  Irregular heart rhythm?  Vascular:  []  Pain in calf, thigh, or hip brought on by walking? []  Pain in feet at night that wakes you up from your sleep? []  Blood clot in your veins? []  Leg swelling?  Pulmonary:  []  Oxygen  at home? []  Productive cough? []  Wheezing?  Neurologic:  []  Sudden weakness in arms or legs? []  Sudden numbness in arms or legs? []  Sudden onset of  difficult speaking or slurred speech? []  Temporary loss of vision in one eye? []  Problems with dizziness?  Gastrointestinal:  []  Blood in stool? []  Vomited blood?  Genitourinary:  []  Burning when urinating? []  Blood in urine?  Psychiatric:  []  Major depression  Hematologic:  []  Bleeding problems? []  Problems with blood clotting?  Dermatologic:  []  Rashes or ulcers?  Constitutional:  []  Fever or chills?  Ear/Nose/Throat:  []  Change in hearing? []  Nose bleeds? []  Sore throat?  Musculoskeletal:  []  Back pain? []  Joint pain? []  Muscle pain?   Physical Examination   Vitals:   09/17/23 0909  BP: 111/74  Pulse: 76  Temp: 97.7 F (36.5 C)  TempSrc: Temporal  Weight: 152 lb 1.6 oz (69 kg)   Body mass index is 28.74 kg/m.  General:  WDWN in NAD; vital signs documented above Gait: Not observed HENT: WNL, normocephalic Pulmonary: normal non-labored breathing , without rales, rhonchi,  wheezing Cardiac: regular Abdomen: soft, NT, no masses Skin: without rashes Vascular Exam/Pulses: Brisk biphasic left DP Doppler signal, monophasic left peroneal Doppler signal Extremities: without ischemic changes, without gangrene , without cellulitis; without open wounds;  Musculoskeletal: no muscle wasting or atrophy  Neurologic: A&O X 3;  No focal weakness or paresthesias are detected Psychiatric:  The pt has Normal affect.  Non-Invasive Vascular imaging   ABI (09/17/2023) R:  ABI: 0.67 (0.77),  PT: mono DP: mono TBI: 0.33 L:  ABI: 0.84 (0.36),  PT: mono DP: bi TBI: 0.46   LLE Arterial Duplex (09/17/2023) 50 to 74% stenosis of the proximal common femoral artery, with a PSV of 261 cm/s.  Patent left SFA to above-knee popliteal artery stent without stenosis  Medical Decision Making   Gabrielle [name] is a 68 y.o. female who presents for post angio follow up   The patient recently underwent left popliteal artery balloon angioplasty and left SFA stenting for an  occluded left lower extremity bypass graft causing claudication Her ABIs on the left today are dramatically improved from 0.36 to 0.84.  Left lower extremity arterial duplex demonstrates 50 to 74% stenosis of the proximal common femoral artery, which does not appear flow-limiting.  She has a patent left SFA stent without stenosis She says that her claudication has resolved after recent intervention.  She also denies any rest pain or tissue loss On exam she has palpable femoral pulses and brisk left DP and peroneal Doppler signals She will continue her daily aspirin , Eliquis , and statin and follow up with our office in 6 months with ABIs and LLE arterial duplex   Ahmed Holster PA-C [facility] Office: [phone]  Clinic MD: Serene

## 2023-09-19 ENCOUNTER — Other Ambulatory Visit: Payer: Self-pay

## 2023-09-19 DIAGNOSIS — I739 Peripheral vascular disease, unspecified: Secondary | ICD-10-CM

## 2023-10-17 DIAGNOSIS — J4541 Moderate persistent asthma with (acute) exacerbation: Secondary | ICD-10-CM | POA: Diagnosis not present

## 2023-12-07 DIAGNOSIS — G4700 Insomnia, unspecified: Secondary | ICD-10-CM | POA: Diagnosis not present

## 2023-12-07 DIAGNOSIS — Z1339 Encounter for screening examination for other mental health and behavioral disorders: Secondary | ICD-10-CM | POA: Diagnosis not present

## 2023-12-07 DIAGNOSIS — Z299 Encounter for prophylactic measures, unspecified: Secondary | ICD-10-CM | POA: Diagnosis not present

## 2023-12-07 DIAGNOSIS — Z0000 Encounter for general adult medical examination without abnormal findings: Secondary | ICD-10-CM | POA: Diagnosis not present

## 2023-12-07 DIAGNOSIS — I10 Essential (primary) hypertension: Secondary | ICD-10-CM | POA: Diagnosis not present

## 2023-12-07 DIAGNOSIS — Z1331 Encounter for screening for depression: Secondary | ICD-10-CM | POA: Diagnosis not present

## 2023-12-07 DIAGNOSIS — B0229 Other postherpetic nervous system involvement: Secondary | ICD-10-CM | POA: Diagnosis not present

## 2023-12-07 DIAGNOSIS — Z7189 Other specified counseling: Secondary | ICD-10-CM | POA: Diagnosis not present

## 2023-12-10 ENCOUNTER — Other Ambulatory Visit: Payer: Self-pay | Admitting: Physician Assistant

## 2023-12-10 DIAGNOSIS — E785 Hyperlipidemia, unspecified: Secondary | ICD-10-CM | POA: Diagnosis not present

## 2023-12-10 DIAGNOSIS — Z79899 Other long term (current) drug therapy: Secondary | ICD-10-CM | POA: Diagnosis not present

## 2023-12-10 DIAGNOSIS — F419 Anxiety disorder, unspecified: Secondary | ICD-10-CM | POA: Diagnosis not present

## 2023-12-17 DIAGNOSIS — J4541 Moderate persistent asthma with (acute) exacerbation: Secondary | ICD-10-CM | POA: Diagnosis not present

## 2024-03-17 ENCOUNTER — Ambulatory Visit

## 2024-03-17 ENCOUNTER — Encounter (HOSPITAL_COMMUNITY)

## 2024-04-14 ENCOUNTER — Ambulatory Visit

## 2024-04-14 ENCOUNTER — Ambulatory Visit (HOSPITAL_COMMUNITY)
